# Patient Record
Sex: Female | Born: 1960 | Race: White | Hispanic: No | Smoking: Former smoker
Health system: Southern US, Community
[De-identification: ages and names within clinical notes are randomized; demographics above are authoritative.]

## PROBLEM LIST (undated history)

## (undated) ENCOUNTER — Emergency Department: Admission: EM | Discharge status: Absent without leave | Payer: Medicare Other

## (undated) DIAGNOSIS — F419 Anxiety disorder, unspecified: Secondary | ICD-10-CM

## (undated) DIAGNOSIS — R569 Unspecified convulsions: Secondary | ICD-10-CM

## (undated) DIAGNOSIS — F319 Bipolar disorder, unspecified: Secondary | ICD-10-CM

## (undated) DIAGNOSIS — E119 Type 2 diabetes mellitus without complications: Secondary | ICD-10-CM

## (undated) DIAGNOSIS — F0390 Unspecified dementia without behavioral disturbance: Secondary | ICD-10-CM

## (undated) DIAGNOSIS — J45909 Unspecified asthma, uncomplicated: Secondary | ICD-10-CM

## (undated) DIAGNOSIS — J449 Chronic obstructive pulmonary disease, unspecified: Secondary | ICD-10-CM

## (undated) DIAGNOSIS — I509 Heart failure, unspecified: Secondary | ICD-10-CM

## (undated) DIAGNOSIS — G40909 Epilepsy, unspecified, not intractable, without status epilepticus: Secondary | ICD-10-CM

## (undated) HISTORY — PX: OTHER SURGICAL HISTORY: SHX169

## (undated) HISTORY — DX: Anxiety disorder, unspecified: F41.9

## (undated) HISTORY — PX: APPENDECTOMY: SHX54

---

## 1999-04-04 ENCOUNTER — Emergency Department (HOSPITAL_COMMUNITY): Admission: EM | Admit: 1999-04-04 | Discharge: 1999-04-04 | Payer: Self-pay | Admitting: Emergency Medicine

## 1999-04-05 ENCOUNTER — Emergency Department (HOSPITAL_COMMUNITY): Admission: EM | Admit: 1999-04-05 | Discharge: 1999-04-05 | Payer: Self-pay | Admitting: Emergency Medicine

## 1999-04-14 ENCOUNTER — Emergency Department (HOSPITAL_COMMUNITY): Admission: EM | Admit: 1999-04-14 | Discharge: 1999-04-14 | Payer: Self-pay | Admitting: Emergency Medicine

## 1999-06-05 ENCOUNTER — Ambulatory Visit: Admission: RE | Admit: 1999-06-05 | Discharge: 1999-06-05 | Payer: Self-pay | Admitting: Gynecology

## 1999-06-27 ENCOUNTER — Inpatient Hospital Stay (HOSPITAL_COMMUNITY): Admission: RE | Admit: 1999-06-27 | Discharge: 1999-07-04 | Payer: Self-pay | Admitting: Gynecology

## 1999-07-18 ENCOUNTER — Ambulatory Visit: Admission: RE | Admit: 1999-07-18 | Discharge: 1999-07-18 | Payer: Self-pay | Admitting: Gynecology

## 1999-08-29 ENCOUNTER — Ambulatory Visit: Admission: RE | Admit: 1999-08-29 | Discharge: 1999-08-29 | Payer: Self-pay | Admitting: Gynecology

## 2000-11-18 ENCOUNTER — Inpatient Hospital Stay (HOSPITAL_COMMUNITY): Admission: EM | Admit: 2000-11-18 | Discharge: 2000-11-24 | Payer: Self-pay | Admitting: *Deleted

## 2000-11-19 ENCOUNTER — Encounter: Payer: Self-pay | Admitting: *Deleted

## 2001-04-04 ENCOUNTER — Inpatient Hospital Stay (HOSPITAL_COMMUNITY): Admission: EM | Admit: 2001-04-04 | Discharge: 2001-04-07 | Payer: Self-pay | Admitting: Psychiatry

## 2003-12-12 ENCOUNTER — Inpatient Hospital Stay (HOSPITAL_COMMUNITY): Admission: EM | Admit: 2003-12-12 | Discharge: 2003-12-12 | Payer: Self-pay | Admitting: Psychiatry

## 2003-12-12 ENCOUNTER — Inpatient Hospital Stay (HOSPITAL_COMMUNITY): Admission: EM | Admit: 2003-12-12 | Discharge: 2003-12-14 | Payer: Self-pay | Admitting: Emergency Medicine

## 2004-08-05 ENCOUNTER — Ambulatory Visit: Payer: Self-pay | Admitting: Psychiatry

## 2004-08-05 ENCOUNTER — Inpatient Hospital Stay (HOSPITAL_COMMUNITY): Admission: EM | Admit: 2004-08-05 | Discharge: 2004-08-08 | Payer: Self-pay | Admitting: Psychiatry

## 2004-08-06 ENCOUNTER — Encounter: Payer: Self-pay | Admitting: Emergency Medicine

## 2015-07-01 ENCOUNTER — Encounter: Payer: Self-pay | Admitting: Emergency Medicine

## 2015-07-01 ENCOUNTER — Emergency Department: Payer: Medicare Other

## 2015-07-01 ENCOUNTER — Emergency Department
Admission: EM | Admit: 2015-07-01 | Discharge: 2015-07-01 | Disposition: A | Discharge status: Home / Self Care | Payer: Medicare Other | Attending: Emergency Medicine | Admitting: Emergency Medicine

## 2015-07-01 ENCOUNTER — Other Ambulatory Visit: Payer: Self-pay

## 2015-07-01 DIAGNOSIS — Z79899 Other long term (current) drug therapy: Secondary | ICD-10-CM | POA: Diagnosis not present

## 2015-07-01 DIAGNOSIS — Z87891 Personal history of nicotine dependence: Secondary | ICD-10-CM | POA: Insufficient documentation

## 2015-07-01 DIAGNOSIS — Z7951 Long term (current) use of inhaled steroids: Secondary | ICD-10-CM | POA: Diagnosis not present

## 2015-07-01 DIAGNOSIS — J441 Chronic obstructive pulmonary disease with (acute) exacerbation: Secondary | ICD-10-CM | POA: Diagnosis not present

## 2015-07-01 DIAGNOSIS — F319 Bipolar disorder, unspecified: Secondary | ICD-10-CM | POA: Diagnosis not present

## 2015-07-01 DIAGNOSIS — J40 Bronchitis, not specified as acute or chronic: Secondary | ICD-10-CM

## 2015-07-01 DIAGNOSIS — R509 Fever, unspecified: Secondary | ICD-10-CM | POA: Diagnosis present

## 2015-07-01 HISTORY — DX: Unspecified asthma, uncomplicated: J45.909

## 2015-07-01 HISTORY — DX: Epilepsy, unspecified, not intractable, without status epilepticus: G40.909

## 2015-07-01 HISTORY — DX: Heart failure, unspecified: I50.9

## 2015-07-01 HISTORY — DX: Bipolar disorder, unspecified: F31.9

## 2015-07-01 LAB — CBC WITH DIFFERENTIAL/PLATELET
Basophils Absolute: 0.1 10*3/uL (ref 0–0.1)
Basophils Relative: 1 %
Eosinophils Absolute: 0.1 10*3/uL (ref 0–0.7)
Eosinophils Relative: 2 %
HCT: 39.7 % (ref 35.0–47.0)
Hemoglobin: 12.5 g/dL (ref 12.0–16.0)
Lymphocytes Relative: 25 %
Lymphs Abs: 1.5 10*3/uL (ref 1.0–3.6)
MCH: 25.8 pg — ABNORMAL LOW (ref 26.0–34.0)
MCHC: 31.5 g/dL — ABNORMAL LOW (ref 32.0–36.0)
MCV: 81.9 fL (ref 80.0–100.0)
Monocytes Absolute: 0.5 10*3/uL (ref 0.2–0.9)
Monocytes Relative: 8 %
Neutro Abs: 4 10*3/uL (ref 1.4–6.5)
Neutrophils Relative %: 64 %
Platelets: 172 10*3/uL (ref 150–440)
RBC: 4.85 MIL/uL (ref 3.80–5.20)
RDW: 23.4 % — ABNORMAL HIGH (ref 11.5–14.5)
WBC: 6.2 10*3/uL (ref 3.6–11.0)

## 2015-07-01 LAB — URINALYSIS COMPLETE WITH MICROSCOPIC (ARMC ONLY)
BILIRUBIN URINE: NEGATIVE
Bacteria, UA: NONE SEEN
Glucose, UA: NEGATIVE mg/dL
HGB URINE DIPSTICK: NEGATIVE
KETONES UR: NEGATIVE mg/dL
LEUKOCYTES UA: NEGATIVE
NITRITE: NEGATIVE
PH: 6 (ref 5.0–8.0)
Protein, ur: NEGATIVE mg/dL
SPECIFIC GRAVITY, URINE: 1.012 (ref 1.005–1.030)

## 2015-07-01 LAB — COMPREHENSIVE METABOLIC PANEL
ALT: 15 U/L (ref 14–54)
ANION GAP: 9 (ref 5–15)
AST: 20 U/L (ref 15–41)
Albumin: 3.5 g/dL (ref 3.5–5.0)
Alkaline Phosphatase: 138 U/L — ABNORMAL HIGH (ref 38–126)
BILIRUBIN TOTAL: 0.8 mg/dL (ref 0.3–1.2)
BUN: 15 mg/dL (ref 6–20)
CO2: 21 mmol/L — ABNORMAL LOW (ref 22–32)
Calcium: 9.5 mg/dL (ref 8.9–10.3)
Chloride: 113 mmol/L — ABNORMAL HIGH (ref 101–111)
Creatinine, Ser: 0.86 mg/dL (ref 0.44–1.00)
GFR calc Af Amer: >60 mL/min (ref >60)
Glucose, Bld: 107 mg/dL — ABNORMAL HIGH (ref 65–99)
POTASSIUM: 4.2 mmol/L (ref 3.5–5.1)
Sodium: 143 mmol/L (ref 135–145)
TOTAL PROTEIN: 6 g/dL — AB (ref 6.5–8.1)

## 2015-07-01 MED ORDER — AZITHROMYCIN 250 MG PO TABS: ORAL_TABLET | ORAL | Status: DC

## 2015-07-01 MED ORDER — ALBUTEROL SULFATE HFA 108 (90 BASE) MCG/ACT IN AERS: INHALATION_SPRAY | RESPIRATORY_TRACT | Status: DC

## 2015-07-01 NOTE — ED Provider Notes (Signed)
[facility] Emergency Department Provider Note  ____________________________________________  Time seen: Approximately 8:42 PM  I have reviewed the triage vital signs and the nursing notes.   HISTORY  Chief Complaint Fever  The patient has some baseline cognitive disabilities  HPI Tammy [name] is a 54 y.o. female with history of bipolar disorder, asthma/COPD, and CHF who presents with about one week of cold symptoms, nasal congestion, dry cough.  It was a gradual onset and she reports that she "feels terrible".  She has had mild shortness of breath along with the cough.  She denies chest pain, abdominal pain, nausea, vomiting, dysuria.  She is a former smoker with a history of COPD.   Past Medical History  Diagnosis Date  . Bipolar 1 disorder   . Asthma   . Epilepsy   . CHF (congestive heart failure)     There are no active problems to display for this patient.   History reviewed. No pertinent past surgical history.  Current Outpatient Rx  Name  Route  Sig  Dispense  Refill  . benztropine (COGENTIN) 1 MG tablet   Oral   Take 1 mg by mouth 2 (two) times daily.         . budesonide-formoterol (SYMBICORT) 80-4.5 MCG/ACT inhaler   Inhalation   Inhale 2 puffs into the lungs 2 (two) times daily.         . carvedilol (COREG) 3.125 MG tablet   Oral   Take 3.125 mg by mouth 2 (two) times daily with a meal.         . levothyroxine (SYNTHROID, LEVOTHROID) 75 MCG tablet   Oral   Take 75 mcg by mouth daily before breakfast.         . lithium carbonate 300 MG capsule   Oral   Take 300 mg by mouth 2 (two) times daily with a meal.         . Omega-3 Fatty Acids (FISH OIL) 1000 MG CAPS   Oral   Take 1 capsule by mouth 2 (two) times daily.         . risperiDONE (RISPERDAL) 3 MG tablet   Oral   Take 3 mg by mouth 2 (two) times daily.         Marland Kitchen albuterol (PROVENTIL HFA;VENTOLIN HFA) 108 (90 BASE) MCG/ACT inhaler      Inhale 4-6  puffs by mouth every 4 hours as needed for wheezing, cough, and/or shortness of breath   1 Inhaler   1   . azithromycin (ZITHROMAX) 250 MG tablet      Take 2 tablets PO on day 1, then take 1 tablet PO daily for 4 more days   4 each   0     Allergies Review of patient's allergies indicates no known allergies.  History reviewed. No pertinent family history.  Social History Social History  Substance Use Topics  . Smoking status: Never Smoker   . Smokeless tobacco: None  . Alcohol Use: No    Review of Systems Constitutional: No fever/chills Eyes: No visual changes. ENT: No sore throat.  She drools at baseline. Cardiovascular: Denies chest pain. Respiratory: Mild cough, intermittent shortness of breath particularly associated with a cough Gastrointestinal: No abdominal pain.  No nausea, no vomiting.  No diarrhea.  No constipation. Genitourinary: Negative for dysuria. Musculoskeletal: Negative for back pain. Skin: Negative for rash. Neurological: Negative for headaches, focal weakness or numbness.  10-point ROS otherwise negative.  ____________________________________________   PHYSICAL EXAM:  VITAL SIGNS: ED Triage Vitals  Enc Vitals Group     BP 07/01/15 1808 123/69 mmHg     Pulse Rate 07/01/15 1808 79     Resp 07/01/15 1808 20     Temp 07/01/15 1808 99.1 F (37.3 C)     Temp Source 07/01/15 1808 Oral     SpO2 07/01/15 1808 95 %     Weight 07/01/15 1808 120 lb (54.432 kg)     Height 07/01/15 1808  (1.575 m)     Head Cir --      Peak Flow --      Pain Score 07/01/15 1810 10     Pain Loc --      Pain Edu? --      Excl. in GC? --     Constitutional: Alert and oriented. Well appearing and in no acute distress. Eyes: Conjunctivae are normal. PERRL. EOMI. Head: Atraumatic. Nose: No congestion/rhinnorhea. Mouth/Throat: Mucous membranes are moist.  Oropharynx non-erythematous.  Drools slightly at baseline but she states that she always wears at bedtime for  this.  She denies any sore throat Neck: No stridor.   Hematological/Lymphatic/Immunilogical: No cervical lymphadenopathy. Cardiovascular: Normal rate, regular rhythm. Grossly normal heart sounds.  Good peripheral circulation. Respiratory: Normal respiratory effort.  No retractions.  Mild expiratory wheezing throughout with a few crackles in the bases, left worse than right Gastrointestinal: Soft and nontender. No distention. No abdominal bruits. No CVA tenderness. Musculoskeletal: No lower extremity tenderness nor edema.  No joint effusions. Neurologic:  Normal speech and language. No gross focal neurologic deficits are appreciated.  Skin:  Skin is warm, dry and intact. No rash noted.   ____________________________________________   LABS (all labs ordered are listed, but only abnormal results are displayed)  Labs Reviewed  CBC WITH DIFFERENTIAL/PLATELET - Abnormal; Notable for the following:    MCH 25.8 (*)    MCHC 31.5 (*)    RDW 23.4 (*)    All other components within normal limits  URINALYSIS COMPLETEWITH MICROSCOPIC ([facility] ONLY) - Abnormal; Notable for the following:    Color, Urine YELLOW (*)    APPearance CLEAR (*)    Squamous Epithelial / LPF 0-5 (*)    All other components within normal limits  COMPREHENSIVE METABOLIC PANEL - Abnormal; Notable for the following:    Chloride 113 (*)    CO2 21 (*)    Glucose, Bld 107 (*)    Total Protein 6.0 (*)    Alkaline Phosphatase 138 (*)    All other components within normal limits   ____________________________________________  EKG  ED ECG REPORT I, FORBACH, CORY, the attending physician, personally viewed and interpreted this ECG.  Date: 07/01/2015 EKG Time: 18:06 Rate: 63 Rhythm: normal sinus rhythm QRS Axis: normal Intervals: normal ST/T Wave abnormalities: normal Conduction Disutrbances: none Narrative Interpretation: unremarkable  ____________________________________________  RADIOLOGY   Dg Neck Soft  Tissue  07/01/2015   CLINICAL DATA:  Throat pain and uncontrollable drooling.  EXAM: NECK SOFT TISSUES - 1+ VIEW  COMPARISON:  The cervical spine dated 12/06/2014.  FINDINGS: Lower cervical spine degenerative changes. Normal appearing soft tissues and airway. Atheromatous aortic arch calcifications.  IMPRESSION: No acute abnormality.   Electronically Signed   By: Beckie Salts M.D.   On: 07/01/2015 19:29   Dg Chest 2 View  07/01/2015   CLINICAL DATA:  Chest pain and cough for 1 week  EXAM: CHEST - 2 VIEW  COMPARISON:  01/29/2015  FINDINGS: Cardiac shadow is stable. Aortic calcifications  are again noted. The lungs are well aerated bilaterally but hyperinflated. No focal infiltrate or sizable effusion is seen. No bony abnormality is noted.  IMPRESSION: COPD without acute abnormality.   Electronically Signed   By: Alcide Clever M.D.   On: 07/01/2015 19:19    ____________________________________________   PROCEDURES  Procedure(s) performed: None  Critical Care performed: No ____________________________________________   INITIAL IMPRESSION / ASSESSMENT AND PLAN / ED COURSE  Pertinent labs & imaging results that were available during my care of the patient were reviewed by me and considered in my medical decision making (see chart for details).  The patient is well-appearing and appears to be at her baseline.  Her labs are all reassuring as are her chest x-ray and neck x-ray, which I got given her history of drooling although apparently this is chronic.  I suspect she is suffering from mild bronchitis.  Given her history of COPD and a week of symptoms, I will treat with azithromycin.  I do not believe that she has symptoms severe enough to necessitate prednisone at this time and I do not want to suppress her immune system.  She is well-appearing and in no acute distress and has been stable throughout her stay in the emergency department.  ____________________________________________  FINAL  CLINICAL IMPRESSION(S) / ED DIAGNOSES  Final diagnoses:  Bronchitis      NEW MEDICATIONS STARTED DURING THIS VISIT:  Discharge Medication List as of 07/01/2015 11:03 PM    START taking these medications   Details  azithromycin (ZITHROMAX) 250 MG tablet Take 2 tablets PO on day 1, then take 1 tablet PO daily for 4 more days, Print         Loleta Rose, MD 07/02/15 [phone]

## 2015-07-01 NOTE — ED Notes (Signed)
Pt here via ems from the group home told staff that she felt terrible. Pt states that she hurts in her chest, coughing, pt is drooling and wearing a cloth to catch the excess saliva.

## 2015-07-01 NOTE — ED Notes (Signed)
Pt reports cold sx, nasal congestion, dry cough x 1 week.  Pt NAD at this time.

## 2015-07-01 NOTE — Discharge Instructions (Signed)
We believe that your symptoms are caused today by an exacerbation of your COPD, and possibly bronchitis.  Please take the prescribed medications and any medications that you have at home for your COPD.  Follow up with your doctor as recommended.  If you develop any new or worsening symptoms, including but not limited to fever, persistent vomiting, worsening shortness of breath, or other symptoms that concern you, please return to the Emergency Department immediately. ° ° °Chronic Obstructive Pulmonary Disease °Chronic obstructive pulmonary disease (COPD) is a common lung condition in which airflow from the lungs is limited. COPD is a general term that can be used to describe many different lung problems that limit airflow, including both chronic bronchitis and emphysema.  If you have COPD, your lung function will probably never return to normal, but there are measures you can take to improve lung function and make yourself feel better.  °CAUSES  °· Smoking (common).   °· Exposure to secondhand smoke.   °· Genetic problems. °· Chronic inflammatory lung diseases or recurrent infections. °SYMPTOMS  °· Shortness of breath, especially with physical activity.   °· Deep, persistent (chronic) cough with a large amount of thick mucus.   °· Wheezing.   °· Rapid breaths (tachypnea).   °· Gray or bluish discoloration (cyanosis) of the skin, especially in fingers, toes, or lips.   °· Fatigue.   °· Weight loss.   °· Frequent infections or episodes when breathing symptoms become much worse (exacerbations).   °· Chest tightness. °DIAGNOSIS  °Your health care provider will take a medical history and perform a physical examination to make the initial diagnosis.  Additional tests for COPD may include:  °· Lung (pulmonary) function tests. °· Chest X-ray. °· CT scan. °· Blood tests. °TREATMENT  °Treatment available to help you feel better when you have COPD includes:  °· Inhaler and nebulizer medicines. These help manage the symptoms of  COPD and make your breathing more comfortable. °· Supplemental oxygen. Supplemental oxygen is only helpful if you have a low oxygen level in your blood.   °· Exercise and physical activity. These are beneficial for nearly all people with COPD. Some people may also benefit from a pulmonary rehabilitation program. °HOME CARE INSTRUCTIONS  °· Take all medicines (inhaled or pills) as directed by your health care provider. °· Avoid over-the-counter medicines or cough syrups that dry up your airway (such as antihistamines) and slow down the elimination of secretions unless instructed otherwise by your health care provider.   °· If you are a smoker, the most important thing that you can do is stop smoking. Continuing to smoke will cause further lung damage and breathing trouble. Ask your health care provider for help with quitting smoking. He or she can direct you to community resources or hospitals that provide support. °· Avoid exposure to irritants such as smoke, chemicals, and fumes that aggravate your breathing. °· Use oxygen therapy and pulmonary rehabilitation if directed by your health care provider. If you require home oxygen therapy, ask your health care provider whether you should purchase a pulse oximeter to measure your oxygen level at home.   °· Avoid contact with individuals who have a contagious illness. °· Avoid extreme temperature and humidity changes. °· Eat healthy foods. Eating smaller, more frequent meals and resting before meals may help you maintain your strength. °· Stay active, but balance activity with periods of rest. Exercise and physical activity will help you maintain your ability to do things you want to do. °· Preventing infection and hospitalization is very important when you have COPD. Make sure   to receive all the vaccines your health care provider recommends, especially the pneumococcal and influenza vaccines. Ask your health care provider whether you need a pneumonia vaccine. °· Learn  and use relaxation techniques to manage stress. °· Learn and use controlled breathing techniques as directed by your health care provider. Controlled breathing techniques include:   °¨ Pursed lip breathing. Start by breathing in (inhaling) through your nose for 1 second. Then, purse your lips as if you were going to whistle and breathe out (exhale) through the pursed lips for 2 seconds.   °¨ Diaphragmatic breathing. Start by putting one hand on your abdomen just above your waist. Inhale slowly through your nose. The hand on your abdomen should move out. Then purse your lips and exhale slowly. You should be able to feel the hand on your abdomen moving in as you exhale.   °· Learn and use controlled coughing to clear mucus from your lungs. Controlled coughing is a series of short, progressive coughs. The steps of controlled coughing are:   °1. Lean your head slightly forward.   °2. Breathe in deeply using diaphragmatic breathing.   °3. Try to hold your breath for 3 seconds.   °4. Keep your mouth slightly open while coughing twice.   °5. Spit any mucus out into a tissue.   °6. Rest and repeat the steps once or twice as needed. °SEEK MEDICAL CARE IF:  °· You are coughing up more mucus than usual.   °· There is a change in the color or thickness of your mucus.   °· Your breathing is more labored than usual.   °· Your breathing is faster than usual.   °SEEK IMMEDIATE MEDICAL CARE IF:  °· You have shortness of breath while you are resting.   °· You have shortness of breath that prevents you from: °¨ Being able to talk.   °¨ Performing your usual physical activities.   °· You have chest pain lasting longer than 5 minutes.   °· Your skin color is more cyanotic than usual. °· You measure low oxygen saturations for longer than 5 minutes with a pulse oximeter. °MAKE SURE YOU:  °· Understand these instructions. °· Will watch your condition. °· Will get help right away if you are not doing well or get worse. °Document Released:  07/03/2005 Document Revised: 02/07/2014 Document Reviewed: 05/20/2013 °[employer]® Patient Information ©2015 [employer]. This information is not intended to replace advice given to you by your health care provider. Make sure you discuss any questions you have with your health care provider. ° ° ° °

## 2015-07-03 ENCOUNTER — Encounter: Payer: Self-pay | Admitting: Emergency Medicine

## 2015-07-03 ENCOUNTER — Emergency Department
Admission: EM | Admit: 2015-07-03 | Discharge: 2015-07-03 | Disposition: A | Discharge status: Home / Self Care | Payer: Medicare Other | Attending: Emergency Medicine | Admitting: Emergency Medicine

## 2015-07-03 DIAGNOSIS — R300 Dysuria: Secondary | ICD-10-CM | POA: Diagnosis not present

## 2015-07-03 DIAGNOSIS — R55 Syncope and collapse: Secondary | ICD-10-CM | POA: Insufficient documentation

## 2015-07-03 DIAGNOSIS — R531 Weakness: Secondary | ICD-10-CM | POA: Diagnosis present

## 2015-07-03 LAB — CBC WITH DIFFERENTIAL/PLATELET
BASOS ABS: 0 10*3/uL (ref 0–0.1)
Basophils Relative: 1 %
EOS PCT: 2 %
Eosinophils Absolute: 0.1 10*3/uL (ref 0–0.7)
HCT: 40 % (ref 35.0–47.0)
Hemoglobin: 12.6 g/dL (ref 12.0–16.0)
LYMPHS PCT: 22 %
Lymphs Abs: 1.3 10*3/uL (ref 1.0–3.6)
MCH: 25.7 pg — AB (ref 26.0–34.0)
MCHC: 31.4 g/dL — AB (ref 32.0–36.0)
MCV: 81.8 fL (ref 80.0–100.0)
MONO ABS: 0.5 10*3/uL (ref 0.2–0.9)
Monocytes Relative: 8 %
Neutro Abs: 4 10*3/uL (ref 1.4–6.5)
Neutrophils Relative %: 67 %
PLATELETS: 156 10*3/uL (ref 150–440)
RBC: 4.89 MIL/uL (ref 3.80–5.20)
RDW: 23.2 % — AB (ref 11.5–14.5)
WBC: 5.9 10*3/uL (ref 3.6–11.0)

## 2015-07-03 LAB — COMPREHENSIVE METABOLIC PANEL
ALT: 15 U/L (ref 14–54)
AST: 22 U/L (ref 15–41)
Albumin: 3.4 g/dL — ABNORMAL LOW (ref 3.5–5.0)
Alkaline Phosphatase: 131 U/L — ABNORMAL HIGH (ref 38–126)
Anion gap: 3 — ABNORMAL LOW (ref 5–15)
BUN: 13 mg/dL (ref 6–20)
CALCIUM: 8.7 mg/dL — AB (ref 8.9–10.3)
CO2: 24 mmol/L (ref 22–32)
CREATININE: 0.77 mg/dL (ref 0.44–1.00)
Chloride: 114 mmol/L — ABNORMAL HIGH (ref 101–111)
GLUCOSE: 102 mg/dL — AB (ref 65–99)
Potassium: 3.8 mmol/L (ref 3.5–5.1)
Sodium: 141 mmol/L (ref 135–145)
TOTAL PROTEIN: 5.9 g/dL — AB (ref 6.5–8.1)
Total Bilirubin: 0.8 mg/dL (ref 0.3–1.2)

## 2015-07-03 LAB — TROPONIN I: Troponin I: <0.03 ng/mL (ref <0.031)

## 2015-07-03 LAB — LITHIUM LEVEL: Lithium Lvl: 1.1 mmol/L (ref 0.60–1.20)

## 2015-07-03 MED ORDER — NITROFURANTOIN MONOHYD MACRO 100 MG PO CAPS: 100.0000 mg | ORAL_CAPSULE | Freq: Two times a day (BID) | ORAL | Status: DC

## 2015-07-03 MED ORDER — NITROFURANTOIN MONOHYD MACRO 100 MG PO CAPS
100.0000 mg | ORAL_CAPSULE | Freq: Once | ORAL | Status: AC
  Administered 2015-07-03: 100 mg via ORAL
  Filled 2015-07-03: qty 1

## 2015-07-03 MED ORDER — BENZTROPINE MESYLATE 1 MG PO TABS: 1.0000 mg | ORAL_TABLET | Freq: Two times a day (BID) | ORAL | Status: DC

## 2015-07-03 MED ORDER — SODIUM CHLORIDE 0.9 % IV SOLN
Freq: Once | INTRAVENOUS | Status: AC
  Administered 2015-07-03: 1000 mL via INTRAVENOUS
  Filled 2015-07-03: qty 1000

## 2015-07-03 NOTE — ED Notes (Signed)
pt to ed via ems from home with c/o syncopal episode today at [facility].  Pt states she fell backwards and hit her head.  Pt alert and oriented at this time.  Pt placed on cm ekg done, and dr Mayford Knife at bedside.

## 2015-07-03 NOTE — Discharge Instructions (Signed)
Syncope °Syncope is a medical term for fainting or passing out. This means you lose consciousness and drop to the ground. People are generally unconscious for less than 5 minutes. You may have some muscle twitches for up to 15 seconds before waking up and returning to normal. Syncope occurs more often in older adults, but it can happen to anyone. While most causes of syncope are not dangerous, syncope can be a sign of a serious medical problem. It is important to seek medical care.  °CAUSES  °Syncope is caused by a sudden drop in blood flow to the brain. The specific cause is often not determined. Factors that can bring on syncope include: °· Taking medicines that lower blood pressure. °· Sudden changes in posture, such as standing up quickly. °· Taking more medicine than prescribed. °· Standing in one place for too long. °· Seizure disorders. °· Dehydration and excessive exposure to heat. °· Low blood sugar (hypoglycemia). °· Straining to have a bowel movement. °· Heart disease, irregular heartbeat, or other circulatory problems. °· Fear, emotional distress, seeing blood, or severe pain. °SYMPTOMS  °Right before fainting, you may: °· Feel dizzy or light-headed. °· Feel nauseous. °· See all white or all black in your field of vision. °· Have cold, clammy skin. °DIAGNOSIS  °Your health care provider will ask about your symptoms, perform a physical exam, and perform an electrocardiogram (ECG) to record the electrical activity of your heart. Your health care provider may also perform other heart or blood tests to determine the cause of your syncope which may include: °· Transthoracic echocardiogram (TTE). During echocardiography, sound waves are used to evaluate how blood flows through your heart. °· Transesophageal echocardiogram (TEE). °· Cardiac monitoring. This allows your health care provider to monitor your heart rate and rhythm in real time. °· Holter monitor. This is a portable device that records your  heartbeat and can help diagnose heart arrhythmias. It allows your health care provider to track your heart activity for several days, if needed. °· Stress tests by exercise or by giving medicine that makes the heart beat faster. °TREATMENT  °In most cases, no treatment is needed. Depending on the cause of your syncope, your health care provider may recommend changing or stopping some of your medicines. °HOME CARE INSTRUCTIONS °· Have someone stay with you until you feel stable. °· Do not drive, use machinery, or play sports until your health care provider says it is okay. °· Keep all follow-up appointments as directed by your health care provider. °· Lie down right away if you start feeling like you might faint. Breathe deeply and steadily. Wait until all the symptoms have passed. °· Drink enough fluids to keep your urine clear or pale yellow. °· If you are taking blood pressure or heart medicine, get up slowly and take several minutes to sit and then stand. This can reduce dizziness. °SEEK IMMEDIATE MEDICAL CARE IF:  °· You have a severe headache. °· You have unusual pain in the chest, abdomen, or back. °· You are bleeding from your mouth or rectum, or you have black or tarry stool. °· You have an irregular or very fast heartbeat. °· You have pain with breathing. °· You have repeated fainting or seizure-like jerking during an episode. °· You faint when sitting or lying down. °· You have confusion. °· You have trouble walking. °· You have severe weakness. °· You have vision problems. °If you fainted, call your local emergency services (911 in [place] Do not drive   yourself to the hospital.  MAKE SURE YOU:  Understand these instructions.  Will watch your condition.  Will get help right away if you are not doing well or get worse. Document Released: 09/23/2005 Document Revised: 09/28/2013 Document Reviewed: 11/22/2011 [facility] Patient Information 2015 [place], [place]. This information is not intended to replace  advice given to you by your health care provider. Make sure you discuss any questions you have with your health care provider.  He will need to stop taking propranolol and go back to take Cogentin after previously prescribed dosage. Propranolol is likely the reason you've been feeling lightheaded and passed out.

## 2015-07-03 NOTE — ED Provider Notes (Signed)
[facility] Emergency Department Adoria Kawamoto Note     Time seen: ----------------------------------------- 10:02 AM on 07/03/2015 -----------------------------------------    I have reviewed the triage vital signs and the nursing notes.   HISTORY  Chief Complaint No chief complaint on file.    HPI Tammy [name] is a 54 y.o. female who presents ER after syncopal episode. Patient is not able to describe what happened when she passed out. Patient states she's been feeling ill for the last week. She cannot describe why she feels poorly. Denies any fevers, chills, chest pain, shortness of breath, nausea vomiting or diarrhea. She does have history of bipolar disorder, notes her Geodon was changed to lithium recently.   Past Medical History  Diagnosis Date  . Bipolar 1 disorder   . Asthma   . Epilepsy   . CHF (congestive heart failure)     There are no active problems to display for this patient.   No past surgical history on file.  Allergies Review of patient's allergies indicates no known allergies.  Social History Social History  Substance Use Topics  . Smoking status: Never Smoker   . Smokeless tobacco: Not on file  . Alcohol Use: No    Review of Systems Constitutional: Negative for fever. Eyes: Negative for visual changes. ENT: Negative for sore throat. Cardiovascular: Negative for chest pain. Respiratory: Negative for shortness of breath. Gastrointestinal: Negative for abdominal pain, vomiting and diarrhea. Genitourinary: Negative for dysuria. Musculoskeletal: Negative for back pain. Skin: Negative for rash. Neurological: Negative for headaches, positive for weakness  10-point ROS otherwise negative.  ____________________________________________   PHYSICAL EXAM:  VITAL SIGNS: ED Triage Vitals  Enc Vitals Group     BP --      Pulse --      Resp --      Temp --      Temp src --      SpO2 --      Weight --      Height  --      Head Cir --      Peak Flow --      Pain Score --      Pain Loc --      Pain Edu? --      Excl. in GC? --     Constitutional: Alert and oriented. Well appearing and in no distress. Eyes: Conjunctivae are normal. PERRL. Normal extraocular movements. ENT   Head: Normocephalic and atraumatic.   Nose: No congestion/rhinnorhea.   Mouth/Throat: Mucous membranes are moist.   Neck: No stridor. Cardiovascular: Normal rate, regular rhythm. Normal and symmetric distal pulses are present in all extremities. No murmurs, rubs, or gallops. Respiratory: Normal respiratory effort without tachypnea nor retractions. Breath sounds are clear and equal bilaterally. No wheezes/rales/rhonchi. Gastrointestinal: Soft and nontender. No distention. No abdominal bruits.  Musculoskeletal: Nontender with normal range of motion in all extremities. No joint effusions.  No lower extremity tenderness nor edema. Neurologic:  Normal speech and language. No gross focal neurologic deficits are appreciated. Speech is normal. No gait instability. Skin:  Skin is warm, dry and intact. No rash noted. Psychiatric: Flat mood and affect. ____________________________________________  EKG: Interpreted by me. Sinus bradycardia with rate of 59 bpm, nonspecific T wave changes, normal axis. No evidence of hypertrophy or acute infarction.  ____________________________________________  ED COURSE:  Pertinent labs & imaging results that were available during my care of the patient were reviewed by me and considered in my medical decision making (see  chart for details). Unclear etiology for syncope. We'll check labs and lithium level ____________________________________________    LABS (pertinent positives/negatives)  Labs Reviewed  CBC WITH DIFFERENTIAL/PLATELET - Abnormal; Notable for the following:    MCH 25.7 (*)    MCHC 31.4 (*)    RDW 23.2 (*)    All other components within normal limits  COMPREHENSIVE  METABOLIC PANEL - Abnormal; Notable for the following:    Chloride 114 (*)    Glucose, Bld 102 (*)    Calcium 8.7 (*)    Total Protein 5.9 (*)    Albumin 3.4 (*)    Alkaline Phosphatase 131 (*)    Anion gap 3 (*)    All other components within normal limits  TROPONIN I  LITHIUM LEVEL  URINALYSIS COMPLETEWITH MICROSCOPIC ([facility] ONLY)     ____________________________________________  FINAL ASSESSMENT AND PLAN  Syncope, bipolar disorder  Plan: Patient with labs and imaging as dictated above. Symptoms are probably related to propranolol. We'll have her hold this medication and start back on Cogentin. Patient also describes some dysuria but is unable to provide a urine sample after repeated attempts here. She'll be placed on Macrobid to cover for UTI. She is also again advised to change propranolol back to Cogentin. This is likely the cause for her symptoms. She is stable for outpatient follow-up.   Emily Filbert, MD   Emily Filbert, MD 07/03/15 [phone]

## 2015-08-25 ENCOUNTER — Encounter: Payer: Self-pay | Admitting: *Deleted

## 2015-08-25 ENCOUNTER — Emergency Department
Admission: EM | Admit: 2015-08-25 | Discharge: 2015-08-25 | Disposition: A | Discharge status: Home / Self Care | Payer: Medicare Other | Attending: Emergency Medicine | Admitting: Emergency Medicine

## 2015-08-25 DIAGNOSIS — Z792 Long term (current) use of antibiotics: Secondary | ICD-10-CM | POA: Insufficient documentation

## 2015-08-25 DIAGNOSIS — Z79899 Other long term (current) drug therapy: Secondary | ICD-10-CM | POA: Diagnosis not present

## 2015-08-25 DIAGNOSIS — Z7951 Long term (current) use of inhaled steroids: Secondary | ICD-10-CM | POA: Diagnosis not present

## 2015-08-25 DIAGNOSIS — F419 Anxiety disorder, unspecified: Secondary | ICD-10-CM

## 2015-08-25 DIAGNOSIS — R55 Syncope and collapse: Secondary | ICD-10-CM | POA: Diagnosis present

## 2015-08-25 LAB — URINALYSIS COMPLETE WITH MICROSCOPIC (ARMC ONLY)
BACTERIA UA: NONE SEEN
Bilirubin Urine: NEGATIVE
GLUCOSE, UA: NEGATIVE mg/dL
Hgb urine dipstick: NEGATIVE
KETONES UR: NEGATIVE mg/dL
NITRITE: NEGATIVE
PROTEIN: NEGATIVE mg/dL
SPECIFIC GRAVITY, URINE: 1.009 (ref 1.005–1.030)
pH: 6 (ref 5.0–8.0)

## 2015-08-25 LAB — BASIC METABOLIC PANEL
Anion gap: 3 — ABNORMAL LOW (ref 5–15)
BUN: 18 mg/dL (ref 6–20)
CALCIUM: 8.6 mg/dL — AB (ref 8.9–10.3)
CO2: 24 mmol/L (ref 22–32)
CREATININE: 0.74 mg/dL (ref 0.44–1.00)
Chloride: 114 mmol/L — ABNORMAL HIGH (ref 101–111)
Glucose, Bld: 97 mg/dL (ref 65–99)
Potassium: 4.5 mmol/L (ref 3.5–5.1)
SODIUM: 141 mmol/L (ref 135–145)

## 2015-08-25 LAB — CBC
HCT: 34.6 % — ABNORMAL LOW (ref 35.0–47.0)
Hemoglobin: 11.2 g/dL — ABNORMAL LOW (ref 12.0–16.0)
MCH: 27.6 pg (ref 26.0–34.0)
MCHC: 32.4 g/dL (ref 32.0–36.0)
MCV: 85.1 fL (ref 80.0–100.0)
PLATELETS: 166 10*3/uL (ref 150–440)
RBC: 4.06 MIL/uL (ref 3.80–5.20)
RDW: 18.8 % — AB (ref 11.5–14.5)
WBC: 6.2 10*3/uL (ref 3.6–11.0)

## 2015-08-25 LAB — TROPONIN I: Troponin I: 0.03 ng/mL (ref <0.031)

## 2015-08-25 LAB — LITHIUM LEVEL: Lithium Lvl: 1.05 mmol/L (ref 0.60–1.20)

## 2015-08-25 MED ORDER — LORAZEPAM 1 MG PO TABS
1.0000 mg | ORAL_TABLET | Freq: Once | ORAL | Status: AC
  Administered 2015-08-25: 1 mg via ORAL
  Filled 2015-08-25: qty 1

## 2015-08-25 MED ORDER — SODIUM CHLORIDE 0.9 % IV BOLUS (SEPSIS)
1000.0000 mL | Freq: Once | INTRAVENOUS | Status: AC
  Administered 2015-08-25: 1000 mL via INTRAVENOUS

## 2015-08-25 NOTE — ED Notes (Signed)
Pt ride said they would be here shortly

## 2015-08-25 NOTE — ED Notes (Signed)
Pt states lives in group home, states goes to [facility] for [facility] daily, states got upset at another person and had syncope episode, nad

## 2015-08-25 NOTE — ED Notes (Signed)
MD at bedside. 

## 2015-08-25 NOTE — ED Notes (Signed)
Was at [facility], pt states she got upset with another person there and had syncope episode, pt states she feels a little anxious still

## 2015-08-25 NOTE — ED Notes (Signed)
Group home contacted and informe d htem she is ready for [facility]

## 2015-08-25 NOTE — ED Provider Notes (Signed)
[facility] Emergency Department Provider Note  ____________________________________________   I have reviewed the triage vital signs and the nursing notes.   HISTORY  Chief Complaint Anxiety    HPI Tammy [name] is a 54 y.o. female who statesshe did not actually pass out, she simply lay on the ground because she was anxious. She denies closed head injury she denies fever/chills she denies any other complaints aside from the fact that she is anxious. She has no SI she has no HI and she is otherwise feeling "just fine".  Past Medical History  Diagnosis Date  . Bipolar 1 disorder (HCC)   . Asthma   . Epilepsy (HCC)   . CHF (congestive heart failure) (HCC)     There are no active problems to display for this patient.   History reviewed. No pertinent past surgical history.  Current Outpatient Rx  Name  Route  Sig  Dispense  Refill  . albuterol (PROVENTIL HFA;VENTOLIN HFA) 108 (90 BASE) MCG/ACT inhaler      Inhale 4-6 puffs by mouth every 4 hours as needed for wheezing, cough, and/or shortness of breath   1 Inhaler   1   . azithromycin (ZITHROMAX) 250 MG tablet      Take 2 tablets PO on day 1, then take 1 tablet PO daily for 4 more days   4 each   0   . benztropine (COGENTIN) 1 MG tablet   Oral   Take 1 tablet (1 mg total) by mouth 2 (two) times daily.   60 tablet   1   . benztropine (COGENTIN) 1 MG tablet   Oral   Take 1 tablet (1 mg total) by mouth 2 (two) times daily.   60 tablet   2   . budesonide-formoterol (SYMBICORT) 80-4.5 MCG/ACT inhaler   Inhalation   Inhale 2 puffs into the lungs 2 (two) times daily.         . carvedilol (COREG) 3.125 MG tablet   Oral   Take 3.125 mg by mouth 2 (two) times daily with a meal.         . levothyroxine (SYNTHROID, LEVOTHROID) 75 MCG tablet   Oral   Take 75 mcg by mouth daily before breakfast.         . lithium carbonate 300 MG capsule   Oral   Take 300 mg by mouth 2 (two) times  daily with a meal.         . nitrofurantoin, macrocrystal-monohydrate, (MACROBID) 100 MG capsule   Oral   Take 1 capsule (100 mg total) by mouth 2 (two) times daily.   20 capsule   0   . Omega-3 Fatty Acids (FISH OIL) 1000 MG CAPS   Oral   Take 1 capsule by mouth 2 (two) times daily.         . risperiDONE (RISPERDAL) 3 MG tablet   Oral   Take 3 mg by mouth 2 (two) times daily.           Allergies Review of patient's allergies indicates no known allergies.  No family history on file.  Social History Social History  Substance Use Topics  . Smoking status: Never Smoker   . Smokeless tobacco: None  . Alcohol Use: No    Review of Systems Constitutional: No fever/chills Eyes: No visual changes. ENT: No sore throat. No stiff neck no neck pain Cardiovascular: Denies chest pain. Respiratory: Denies shortness of breath. Gastrointestinal:   no vomiting.  No diarrhea.  No constipation. Genitourinary: Negative for dysuria. Musculoskeletal: Negative lower extremity swelling Skin: Negative for rash. Neurological: Negative for headaches, focal weakness or numbness. 10-point ROS otherwise negative.  ____________________________________________   PHYSICAL EXAM:  VITAL SIGNS: ED Triage Vitals  Enc Vitals Group     BP 08/25/15 1130 102/60 mmHg     Pulse Rate 08/25/15 1130 56     Resp 08/25/15 1211 20     Temp 08/25/15 1211 98.9 F (37.2 C)     Temp src --      SpO2 08/25/15 1130 96 %     Weight 08/25/15 1211 120 lb (54.432 kg)     Height 08/25/15 1211  (1.575 m)     Head Cir --      Peak Flow --      Pain Score --      Pain Loc --      Pain Edu? --      Excl. in GC? --     Constitutional: Alert and oriented. Well appearing and in no acute distress. Eyes: Conjunctivae are normal. PERRL. EOMI. Head: Atraumatic. Nose: No congestion/rhinnorhea. Mouth/Throat: Mucous membranes are moist.  Oropharynx non-erythematous. Neck: No stridor.   Nontender with no  meningismus Cardiovascular: Normal rate, regular rhythm. Grossly normal heart sounds.  Good peripheral circulation. Respiratory: Normal respiratory effort.  No retractions. Lungs CTAB. Abdominal: Soft and nontender. No distention. No guarding no rebound Back:  There is no focal tenderness or step off there is no midline tenderness there are no lesions noted. there is no CVA tenderness  Musculoskeletal: No lower extremity tenderness. No joint effusions, no DVT signs strong distal pulses no edema Neurologic:  Normal speech and language. No gross focal neurologic deficits are appreciated.  Skin:  Skin is warm, dry and intact. No rash noted. Psychiatric: Mood and affect are normal. Speech and behavior are normal.  ____________________________________________   LABS (all labs ordered are listed, but only abnormal results are displayed)  Labs Reviewed  BASIC METABOLIC PANEL - Abnormal; Notable for the following:    Chloride 114 (*)    Calcium 8.6 (*)    Anion gap 3 (*)    All other components within normal limits  CBC - Abnormal; Notable for the following:    Hemoglobin 11.2 (*)    HCT 34.6 (*)    RDW 18.8 (*)    All other components within normal limits  URINALYSIS COMPLETEWITH MICROSCOPIC ([facility] ONLY) - Abnormal; Notable for the following:    Color, Urine YELLOW (*)    APPearance CLEAR (*)    Leukocytes, UA TRACE (*)    Squamous Epithelial / LPF 0-5 (*)    All other components within normal limits  TROPONIN I   ____________________________________________  EKG  I personally interpreted any EKGs ordered by me or triage Normal sinus rhythm rate 6 21 bpm no acute ST elevation or acute ST depression normal axis unremarkable EKG ____________________________________________  RADIOLOGY  I reviewed any imaging ordered by me or triage that were performed during my shift ____________________________________________   PROCEDURES  Procedure(s) performed: None  Critical Care  performed: None  ____________________________________________   INITIAL IMPRESSION / ASSESSMENT AND PLAN / ED COURSE  Pertinent labs & imaging results that were available during my care of the patient were reviewed by me and considered in my medical decision making (see chart for details).  Patient with a normal neurologic exam no evidence of trauma states that she had a anxiety attack. Nonetheless we have done a  couple micrograms of workup which is very reassuring. We are pending her lithium level. I did give her Ativan, one point she fell sleep has slightly low blood pressure but when she woke up it was back to normal. Patient has had no evidence of acute pathology here. EKG is normal troponins normal CBC is reassuring electrolytes are reassuring, and I feel patient is safe for discharge if her lithium is normal as anticipated ____________________________________________   FINAL CLINICAL IMPRESSION(S) / ED DIAGNOSES  Final diagnoses:  None     Jeanmarie Plant, MD 08/25/15 [phone]

## 2015-08-25 NOTE — Discharge Instructions (Signed)

## 2016-02-19 ENCOUNTER — Ambulatory Visit: Payer: Medicare Other

## 2016-02-19 ENCOUNTER — Encounter: Payer: Medicare Other | Admitting: Podiatry

## 2016-02-19 DIAGNOSIS — M79673 Pain in unspecified foot: Secondary | ICD-10-CM

## 2016-02-19 NOTE — Progress Notes (Signed)
This encounter was created in error - please disregard.

## 2016-03-11 ENCOUNTER — Ambulatory Visit: Payer: Medicare Other | Admitting: Podiatry

## 2016-03-11 ENCOUNTER — Encounter: Payer: Self-pay | Admitting: Podiatry

## 2016-03-11 ENCOUNTER — Ambulatory Visit (INDEPENDENT_AMBULATORY_CARE_PROVIDER_SITE_OTHER): Payer: Medicare Other | Admitting: Podiatry

## 2016-03-11 VITALS — BP 116/78 | HR 69 | Resp 12

## 2016-03-11 DIAGNOSIS — M79676 Pain in unspecified toe(s): Secondary | ICD-10-CM

## 2016-03-11 DIAGNOSIS — B351 Tinea unguium: Secondary | ICD-10-CM | POA: Diagnosis not present

## 2016-03-11 NOTE — Progress Notes (Signed)
   Subjective:    Patient ID: Tammy [name], female    DOB: 1961/07/30, 55 y.o.   MRN: [id number]  HPI: She presents today with a chief complaint of painful elongated toenails that are too long for her to cut. She states that they're too thick I'm unable to cut them and they hurt. Her particularly with shoe gear or anytime she is walking.    Review of Systems  Skin: Positive for color change.       Objective:   Physical Exam: I have reviewed her past history medications and allergies. Vital signs stable she is alert and oriented 3 pulses are palpable. Neurologic sensorium is intact deep tendon reflexes are intact muscle strength is normal bilateral. She has hammertoe deformities bilateral mild HAV deformities. Symptomatically cutaneous evaluation demonstrates supple well-hydrated cutis no open lesions or wounds. She does have thick yellow dystrophic onychomycotic nails with subungual debris sharp rated nail margins which are painful on palpation as well as debridement.        Assessment & Plan:  Assessment: Pain in limb secondary to onychomycosis.  Plan: Debridement of toenails 1 through 5 bilateral. Follow up with her in 3 months.

## 2016-06-12 ENCOUNTER — Ambulatory Visit: Payer: Medicare Other | Admitting: Podiatry

## 2016-06-25 ENCOUNTER — Ambulatory Visit: Payer: Medicare Other | Admitting: Podiatry

## 2016-07-09 ENCOUNTER — Encounter: Payer: Self-pay | Admitting: Podiatry

## 2016-07-09 ENCOUNTER — Ambulatory Visit (INDEPENDENT_AMBULATORY_CARE_PROVIDER_SITE_OTHER): Payer: Medicare Other | Admitting: Podiatry

## 2016-07-09 VITALS — BP 111/82 | HR 76 | Resp 18

## 2016-07-09 DIAGNOSIS — L608 Other nail disorders: Secondary | ICD-10-CM

## 2016-07-09 DIAGNOSIS — B351 Tinea unguium: Secondary | ICD-10-CM

## 2016-07-09 DIAGNOSIS — M79676 Pain in unspecified toe(s): Secondary | ICD-10-CM

## 2016-07-09 DIAGNOSIS — L603 Nail dystrophy: Secondary | ICD-10-CM

## 2016-07-09 DIAGNOSIS — M79609 Pain in unspecified limb: Principal | ICD-10-CM

## 2016-07-14 NOTE — Progress Notes (Signed)
SUBJECTIVE Patient  presents to office today complaining of elongated, thickened nails. Pain while ambulating in shoes. Patient is unable to trim their own nails.   OBJECTIVE General Patient is awake, alert, and oriented x 3 and in no acute distress. Derm Skin is dry and supple bilateral. Negative open lesions or macerations. Remaining integument unremarkable. Nails are tender, long, thickened and dystrophic with subungual debris, consistent with onychomycosis, 1-5 bilateral. No signs of infection noted. Vasc  DP and PT pedal pulses palpable bilaterally. Temperature gradient within normal limits.  Neuro Epicritic and protective threshold sensation diminished bilaterally.  Musculoskeletal Exam No symptomatic pedal deformities noted bilateral. Muscular strength within normal limits.  ASSESSMENT 1. Onychodystrophic nails 1-5 bilateral with hyperkeratosis of nails.  2. Onychomycosis of nail due to dermatophyte bilateral 3. Pain in foot bilateral  PLAN OF CARE 1. Patient evaluated today.  2. Instructed to maintain good pedal hygiene and foot care.  3. Mechanical debridement of nails 1-5 bilaterally performed using a nail nipper. Filed with dremel without incident.  4. Return to clinic in 3 mos.    Keisa Blow M Thaer Miyoshi, DPM    

## 2016-08-22 ENCOUNTER — Encounter: Payer: Self-pay | Admitting: Emergency Medicine

## 2016-08-22 ENCOUNTER — Emergency Department: Payer: Medicare Other

## 2016-08-22 ENCOUNTER — Emergency Department
Admission: EM | Admit: 2016-08-22 | Discharge: 2016-08-22 | Disposition: A | Discharge status: Home / Self Care | Payer: Medicare Other | Attending: Student in an Organized Health Care Education/Training Program | Admitting: Student in an Organized Health Care Education/Training Program

## 2016-08-22 DIAGNOSIS — Z7951 Long term (current) use of inhaled steroids: Secondary | ICD-10-CM | POA: Insufficient documentation

## 2016-08-22 DIAGNOSIS — Z79899 Other long term (current) drug therapy: Secondary | ICD-10-CM | POA: Diagnosis not present

## 2016-08-22 DIAGNOSIS — J45909 Unspecified asthma, uncomplicated: Secondary | ICD-10-CM | POA: Insufficient documentation

## 2016-08-22 DIAGNOSIS — G40909 Epilepsy, unspecified, not intractable, without status epilepticus: Secondary | ICD-10-CM | POA: Diagnosis not present

## 2016-08-22 DIAGNOSIS — I509 Heart failure, unspecified: Secondary | ICD-10-CM | POA: Diagnosis not present

## 2016-08-22 DIAGNOSIS — R569 Unspecified convulsions: Secondary | ICD-10-CM

## 2016-08-22 HISTORY — DX: Unspecified convulsions: R56.9

## 2016-08-22 LAB — PHENYTOIN LEVEL, TOTAL

## 2016-08-22 LAB — BASIC METABOLIC PANEL
Anion gap: 5 (ref 5–15)
BUN: 11 mg/dL (ref 6–20)
CALCIUM: 9.6 mg/dL (ref 8.9–10.3)
CO2: 30 mmol/L (ref 22–32)
Chloride: 103 mmol/L (ref 101–111)
Creatinine, Ser: 1.01 mg/dL — ABNORMAL HIGH (ref 0.44–1.00)
GFR calc Af Amer: >60 mL/min (ref >60)
Glucose, Bld: 93 mg/dL (ref 65–99)
Potassium: 4.6 mmol/L (ref 3.5–5.1)
SODIUM: 138 mmol/L (ref 135–145)

## 2016-08-22 LAB — LITHIUM LEVEL: Lithium Lvl: 1.12 mmol/L (ref 0.60–1.20)

## 2016-08-22 LAB — GLUCOSE, CAPILLARY: GLUCOSE-CAPILLARY: 92 mg/dL (ref 65–99)

## 2016-08-22 NOTE — ED Triage Notes (Signed)
Per EMS report, patient had a syncopal episode at the behavioral group home she lives at. Patient is a baseline mental status per staff. Patient is alert, holds position independently. VSS per EMS. No injuries noted upon arrival or per EMS report.

## 2016-08-22 NOTE — Discharge Instructions (Signed)
You have been diagnosed with seizures or are suspected of having seizures. °Until you are cleared by your family Dr or a Neurologist the precautions listed should be followed: ° °No driving °No tub bathing °No operating heavy machinery °No climbing to heights, such as on a ladder ° °Do no activity that would cause you or others injury if another seizure occurred °Call your Dr. at the number provided below for a neurology appointment or call your Primary care doctor so that they can clear you or coordinate care with a neurologist who can clear you. ° °

## 2016-08-22 NOTE — ED Triage Notes (Addendum)
Pt presents with seizures earlier today, fall and hit the back of her head. , pt with hx of same and does take her meds.  Pt was brought to ed by ems.

## 2016-08-22 NOTE — ED Provider Notes (Signed)
[facility] Emergency Department Provider Note    First MD Initiated Contact with Patient 08/22/16 1135     (approximate)  I have reviewed the triage vital signs and the nursing notes.   HISTORY  Chief Complaint Seizures    HPI Tammy [name] is a 55 y.o. female with a history of seizures as well as congestive heart failure and bipolar 1 disorder presents after a witnessed seizure event that occurred this morning at her behavioral group home. Patient states that she's been compliant with her antiepileptic medications. Patient states that there was no upper jump symptoms. Bystanders stated that she had generalized tonic-clonic shaking with following postictal period. She denies any headache at this time. No numbness or tingling. Denies any chest pain or shortness of breath. States that she generally will have one seizure event yearly.    Past Medical History:  Diagnosis Date  . Asthma   . Bipolar 1 disorder (HCC)   . CHF (congestive heart failure) (HCC)   . Epilepsy (HCC)   . Seizures (HCC)    No family history on file. History reviewed. No pertinent surgical history. There are no active problems to display for this patient.     Prior to Admission medications   Medication Sig Start Date End Date Taking? Authorizing Provider  albuterol (PROVENTIL HFA;VENTOLIN HFA) 108 (90 BASE) MCG/ACT inhaler Inhale 4-6 puffs by mouth every 4 hours as needed for wheezing, cough, and/or shortness of breath 07/01/15   Loleta Roseory Forbach, MD  azithromycin (ZITHROMAX) 250 MG tablet Take 2 tablets PO on day 1, then take 1 tablet PO daily for 4 more days Patient not taking: Reported on 07/09/2016 07/02/15   Loleta Roseory Forbach, MD  benztropine (COGENTIN) 1 MG tablet Take 1 tablet (1 mg total) by mouth 2 (two) times daily. 07/03/15   Emily FilbertJonathan E Williams, MD  benztropine (COGENTIN) 1 MG tablet Take 1 tablet (1 mg total) by mouth 2 (two) times daily. 07/03/15 07/02/16  Emily FilbertJonathan E Williams,  MD  budesonide-formoterol (SYMBICORT) 80-4.5 MCG/ACT inhaler Inhale 2 puffs into the lungs 2 (two) times daily.    Historical Provider, MD  busPIRone (BUSPAR) 5 MG tablet Take 5 mg by mouth 3 (three) times daily.    Historical Provider, MD  carvedilol (COREG) 3.125 MG tablet Take 3.125 mg by mouth 2 (two) times daily with a meal.    Historical Provider, MD  fluticasone (FLONASE) 50 MCG/ACT nasal spray Place into both nostrils daily.    Historical Provider, MD  levothyroxine (SYNTHROID, LEVOTHROID) 75 MCG tablet Take 75 mcg by mouth daily before breakfast.    Historical Provider, MD  lithium carbonate 300 MG capsule Take 300 mg by mouth 2 (two) times daily with a meal.    Historical Provider, MD  loratadine (CLARITIN) 10 MG tablet Take 10 mg by mouth.    Historical Provider, MD  nitrofurantoin, macrocrystal-monohydrate, (MACROBID) 100 MG capsule Take 1 capsule (100 mg total) by mouth 2 (two) times daily. Patient not taking: Reported on 07/09/2016 07/03/15   Emily FilbertJonathan E Williams, MD  nystatin (MYCOSTATIN) 100000 UNIT/ML suspension Take by mouth.    Historical Provider, MD  omega-3 acid ethyl esters (LOVAZA) 1 g capsule Take 1,000 mg by mouth.    Historical Provider, MD  Omega-3 Fatty Acids (FISH OIL) 1000 MG CAPS Take 1 capsule by mouth 2 (two) times daily.    Historical Provider, MD  risperiDONE (RISPERDAL) 3 MG tablet Take 3 mg by mouth 2 (two) times daily.  Historical Provider, MD    Allergies Penicillins and Prednisone    Social History Social History  Substance Use Topics  . Smoking status: Never Smoker  . Smokeless tobacco: Never Used  . Alcohol use No    Review of Systems Patient denies headaches, rhinorrhea, blurry vision, numbness, shortness of breath, chest pain, edema, cough, abdominal pain, nausea, vomiting, diarrhea, dysuria, fevers, rashes or hallucinations unless otherwise stated above in HPI. ____________________________________________   PHYSICAL EXAM:  VITAL  SIGNS: Vitals:   08/22/16 1119  BP: 110/77  Pulse: 72  Resp: 18  Temp: 98.8 F (37.1 C)    Constitutional: Alert and oriented. Well appearing and in no acute distress. Eyes: Conjunctivae are normal. PERRL. EOMI. Head: Atraumatic. Nose: No congestion/rhinnorhea. Mouth/Throat: Mucous membranes are moist.  Oropharynx non-erythematous. Neck: No stridor. Painless ROM. No cervical spine tenderness to palpation Hematological/Lymphatic/Immunilogical: No cervical lymphadenopathy. Cardiovascular: Normal rate, regular rhythm. Grossly normal heart sounds.  Good peripheral circulation. Respiratory: Normal respiratory effort.  No retractions. Lungs CTAB. Gastrointestinal: Soft and nontender. No distention. No abdominal bruits. No CVA tenderness. Musculoskeletal: No lower extremity tenderness nor edema.  No joint effusions. Neurologic: CN- intact.  No facial droop, Normal FNF.  Normal heel to shin.  Sensation intact bilaterally. Normal speech and language. No gross focal neurologic deficits are appreciated. No gait instability.  Skin:  Skin is warm, dry and intact. No rash noted. Psychiatric: Mood and affect are normal. Speech and behavior are normal.  ____________________________________________   LABS (all labs ordered are listed, but only abnormal results are displayed)  Results for orders placed or performed during the hospital encounter of 08/22/16 (from the past 24 hour(s))  Dilantin (phenytoin) Level (if patient is taking this medication)     Status: Abnormal   Collection Time: 08/22/16 11:16 AM  Result Value Ref Range   Phenytoin Lvl <2.5 (L) 10.0 - 20.0 ug/mL  Basic metabolic panel     Status: Abnormal   Collection Time: 08/22/16 11:45 AM  Result Value Ref Range   Sodium 138 135 - 145 mmol/L   Potassium 4.6 3.5 - 5.1 mmol/L   Chloride 103 101 - 111 mmol/L   CO2 30 22 - 32 mmol/L   Glucose, Bld 93 65 - 99 mg/dL   BUN 11 6 - 20 mg/dL   Creatinine, Ser 4.091.01 (H) 0.44 - 1.00 mg/dL    Calcium 9.6 8.9 - 81.110.3 mg/dL   GFR calc non Af Amer >60 >60 mL/min   GFR calc Af Amer >60 >60 mL/min   Anion gap 5 5 - 15  Lithium level     Status: None   Collection Time: 08/22/16 12:23 PM  Result Value Ref Range   Lithium Lvl 1.12 0.60 - 1.20 mmol/L  Glucose, capillary     Status: None   Collection Time: 08/22/16 12:24 PM  Result Value Ref Range   Glucose-Capillary 92 65 - 99 mg/dL   ____________________________________________  EKG My review and personal interpretation at Time: 11:16   Indication: syncope  Rate: 70  Rhythm: sinus Axis: normal Other: non specific st changes, no acute ischemia ____________________________________________  RADIOLOGY   ____________________________________________   PROCEDURES  Procedure(s) performed: none Procedures    Critical Care performed: no ____________________________________________   INITIAL IMPRESSION / ASSESSMENT AND PLAN / ED COURSE  Pertinent labs & imaging results that were available during my care of the patient were reviewed by me and considered in my medical decision making (see chart for details).  DDX: seizure, hyponatremia, hypoglycemia,  dysrhythmia, sdh, iph  MORGEN LINEBAUGH is a 55 y.o. who presents to the ED with seizure-like episode. She arrives hemodynamic stable. No respiratory distress. Will order CT imaging given head injury after seizure episode. We'll continue to monitor in the ER and check for anti elliptic drug levels .  The patient will be placed on continuous pulse oximetry and telemetry for monitoring.  Laboratory evaluation will be sent to evaluate for the above complaints.    Clinical Course as of Aug 22 2117  Thu Aug 22, 2016  1340 Dilantin level was undetectable. Renal function appears normal. Patient is adamant that she is on Dilantin.    [PR]  1405 Patient ambulating in hall without distress.   [PR]  1500 Patients group home caregiver arrives with paper work confirming that the patient  does not take any dilantin.    [PR]  1520 Patient ambulated without hypoxia.  Earlier low reading likely artifact as she has no SOB or respiratory symptoms.  [PR]  1525 Patient was observed in the ER for over 4 hours. She's been hemodynamic stable. No evidence of respiratory distress. Neuro exam is reassuring. Evaluation in the ER is also reassuring. Patient here with caregiver states that she has a roughly one of these episodes a year. Not feel the patient would benefit from inpatient hospitalization at this point she is otherwise well appearing. We'll provide referral for outpatient neurology. Discussed signs and symptoms for which they should return to the ER.  Patient was able to tolerate PO and was able to ambulate with a steady gait.  Have discussed with the patient and available family all diagnostics and treatments performed thus far and all questions were answered to the best of my ability. The patient demonstrates understanding and agreement with plan.   [PR]    Clinical Course User Index [PR] Willy Eddy, MD     ____________________________________________   FINAL CLINICAL IMPRESSION(S) / ED DIAGNOSES  Final diagnoses:  Seizure-like activity (HCC)      NEW MEDICATIONS STARTED DURING THIS VISIT:  New Prescriptions   No medications on file     Note:  This document was prepared using Dragon voice recognition software and may include unintentional dictation errors.    Willy Eddy, MD 08/22/16 2118

## 2016-10-15 ENCOUNTER — Ambulatory Visit (INDEPENDENT_AMBULATORY_CARE_PROVIDER_SITE_OTHER): Payer: Medicare Other | Admitting: Podiatry

## 2016-10-15 DIAGNOSIS — L608 Other nail disorders: Secondary | ICD-10-CM

## 2016-10-15 DIAGNOSIS — L603 Nail dystrophy: Secondary | ICD-10-CM

## 2016-10-15 DIAGNOSIS — B351 Tinea unguium: Secondary | ICD-10-CM | POA: Diagnosis not present

## 2016-10-15 DIAGNOSIS — M79609 Pain in unspecified limb: Secondary | ICD-10-CM

## 2016-10-20 NOTE — Progress Notes (Signed)
   SUBJECTIVE Patient  presents to office today complaining of elongated, thickened nails. Pain while ambulating in shoes. Patient is unable to trim their own nails.   OBJECTIVE General Patient is awake, alert, and oriented x 3 and in no acute distress. Derm Skin is dry and supple bilateral. Negative open lesions or macerations. Remaining integument unremarkable. Nails are tender, long, thickened and dystrophic with subungual debris, consistent with onychomycosis, 1-5 bilateral. No signs of infection noted. Vasc  DP and PT pedal pulses palpable bilaterally. Temperature gradient within normal limits.  Neuro Epicritic and protective threshold sensation diminished bilaterally.  Musculoskeletal Exam No symptomatic pedal deformities noted bilateral. Muscular strength within normal limits.  ASSESSMENT 1. Onychodystrophic nails 1-5 bilateral with hyperkeratosis of nails.  2. Onychomycosis of nail due to dermatophyte bilateral 3. Pain in foot bilateral  PLAN OF CARE 1. Patient evaluated today.  2. Instructed to maintain good pedal hygiene and foot care.  3. Mechanical debridement of nails 1-5 bilaterally performed using a nail nipper. Filed with dremel without incident.  4. Return to clinic in 3 mos.    Alyxandria Wentz M. Ellyson Rarick, DPM [facility]  Dr. Makayela Secrest M. Zohar Maroney, DPM    [address]                                        [place], [place] [address]                Office [phone]  Fax [phone]      

## 2017-01-27 ENCOUNTER — Ambulatory Visit: Payer: Medicare Other | Admitting: Podiatry

## 2017-03-15 ENCOUNTER — Inpatient Hospital Stay
Admission: EM | Admit: 2017-03-15 | Discharge: 2017-03-17 | DRG: 189 | Disposition: A | Discharge status: Home Health | Payer: Medicare Other | Attending: Internal Medicine | Admitting: Internal Medicine

## 2017-03-15 ENCOUNTER — Emergency Department: Payer: Medicare Other

## 2017-03-15 ENCOUNTER — Encounter: Payer: Self-pay | Admitting: Emergency Medicine

## 2017-03-15 DIAGNOSIS — Z7951 Long term (current) use of inhaled steroids: Secondary | ICD-10-CM | POA: Diagnosis not present

## 2017-03-15 DIAGNOSIS — J9621 Acute and chronic respiratory failure with hypoxia: Secondary | ICD-10-CM | POA: Diagnosis present

## 2017-03-15 DIAGNOSIS — Z9981 Dependence on supplemental oxygen: Secondary | ICD-10-CM | POA: Diagnosis not present

## 2017-03-15 DIAGNOSIS — E875 Hyperkalemia: Secondary | ICD-10-CM | POA: Diagnosis present

## 2017-03-15 DIAGNOSIS — J9620 Acute and chronic respiratory failure, unspecified whether with hypoxia or hypercapnia: Secondary | ICD-10-CM | POA: Diagnosis present

## 2017-03-15 DIAGNOSIS — R0902 Hypoxemia: Secondary | ICD-10-CM

## 2017-03-15 DIAGNOSIS — Z23 Encounter for immunization: Secondary | ICD-10-CM

## 2017-03-15 DIAGNOSIS — G40909 Epilepsy, unspecified, not intractable, without status epilepticus: Secondary | ICD-10-CM | POA: Diagnosis present

## 2017-03-15 DIAGNOSIS — I5032 Chronic diastolic (congestive) heart failure: Secondary | ICD-10-CM | POA: Diagnosis present

## 2017-03-15 DIAGNOSIS — F0390 Unspecified dementia without behavioral disturbance: Secondary | ICD-10-CM | POA: Diagnosis present

## 2017-03-15 DIAGNOSIS — Z888 Allergy status to other drugs, medicaments and biological substances status: Secondary | ICD-10-CM | POA: Diagnosis not present

## 2017-03-15 DIAGNOSIS — Z79899 Other long term (current) drug therapy: Secondary | ICD-10-CM

## 2017-03-15 DIAGNOSIS — J441 Chronic obstructive pulmonary disease with (acute) exacerbation: Secondary | ICD-10-CM

## 2017-03-15 DIAGNOSIS — E039 Hypothyroidism, unspecified: Secondary | ICD-10-CM | POA: Diagnosis present

## 2017-03-15 DIAGNOSIS — Z87891 Personal history of nicotine dependence: Secondary | ICD-10-CM | POA: Diagnosis not present

## 2017-03-15 DIAGNOSIS — F319 Bipolar disorder, unspecified: Secondary | ICD-10-CM | POA: Diagnosis present

## 2017-03-15 DIAGNOSIS — Z880 Allergy status to penicillin: Secondary | ICD-10-CM

## 2017-03-15 DIAGNOSIS — I110 Hypertensive heart disease with heart failure: Secondary | ICD-10-CM | POA: Diagnosis present

## 2017-03-15 HISTORY — DX: Unspecified dementia, unspecified severity, without behavioral disturbance, psychotic disturbance, mood disturbance, and anxiety: F03.90

## 2017-03-15 HISTORY — DX: Chronic obstructive pulmonary disease, unspecified: J44.9

## 2017-03-15 LAB — CBC WITH DIFFERENTIAL/PLATELET
BASOS ABS: 0.1 10*3/uL (ref 0–0.1)
Basophils Relative: 1 %
EOS PCT: 2 %
Eosinophils Absolute: 0.2 10*3/uL (ref 0–0.7)
HCT: 39.5 % (ref 35.0–47.0)
HEMOGLOBIN: 12.4 g/dL (ref 12.0–16.0)
LYMPHS PCT: 17 %
Lymphs Abs: 1.9 10*3/uL (ref 1.0–3.6)
MCH: 27.2 pg (ref 26.0–34.0)
MCHC: 31.3 g/dL — ABNORMAL LOW (ref 32.0–36.0)
MCV: 86.8 fL (ref 80.0–100.0)
Monocytes Absolute: 0.9 10*3/uL (ref 0.2–0.9)
Monocytes Relative: 8 %
NEUTROS PCT: 72 %
Neutro Abs: 7.9 10*3/uL — ABNORMAL HIGH (ref 1.4–6.5)
PLATELETS: 228 10*3/uL (ref 150–440)
RBC: 4.55 MIL/uL (ref 3.80–5.20)
RDW: 16.6 % — ABNORMAL HIGH (ref 11.5–14.5)
WBC: 11 10*3/uL (ref 3.6–11.0)

## 2017-03-15 LAB — BASIC METABOLIC PANEL
ANION GAP: 11 (ref 5–15)
BUN: 14 mg/dL (ref 6–20)
CHLORIDE: 106 mmol/L (ref 101–111)
CO2: 21 mmol/L — ABNORMAL LOW (ref 22–32)
Calcium: 9.4 mg/dL (ref 8.9–10.3)
Creatinine, Ser: 1.27 mg/dL — ABNORMAL HIGH (ref 0.44–1.00)
GFR calc Af Amer: 54 mL/min — ABNORMAL LOW (ref >60)
GFR, EST NON AFRICAN AMERICAN: 46 mL/min — AB (ref >60)
GLUCOSE: 189 mg/dL — AB (ref 65–99)
POTASSIUM: 5 mmol/L (ref 3.5–5.1)
SODIUM: 138 mmol/L (ref 135–145)

## 2017-03-15 LAB — BLOOD GAS, ARTERIAL
ACID-BASE DEFICIT: 0 mmol/L (ref 0.0–2.0)
BICARBONATE: 28.3 mmol/L — AB (ref 20.0–28.0)
EXPIRATORY PAP: 6
FIO2: 0.6
INSPIRATORY PAP: 14
Mode: POSITIVE
O2 SAT: 99.4 %
PATIENT TEMPERATURE: 37
PO2 ART: 179 mmHg — AB (ref 83.0–108.0)
RATE: 8 resp/min
pCO2 arterial: 63 mmHg — ABNORMAL HIGH (ref 32.0–48.0)
pH, Arterial: 7.26 — ABNORMAL LOW (ref 7.350–7.450)

## 2017-03-15 LAB — MRSA PCR SCREENING: MRSA by PCR: NEGATIVE

## 2017-03-15 LAB — TROPONIN I

## 2017-03-15 LAB — GLUCOSE, CAPILLARY: Glucose-Capillary: 181 mg/dL — ABNORMAL HIGH (ref 65–99)

## 2017-03-15 MED ORDER — OMEGA-3-ACID ETHYL ESTERS 1 G PO CAPS
1.0000 g | ORAL_CAPSULE | Freq: Every day | ORAL | Status: DC
  Administered 2017-03-16 – 2017-03-17 (×2): 1 g via ORAL
  Filled 2017-03-15 (×2): qty 1

## 2017-03-15 MED ORDER — RISPERIDONE 1 MG PO TABS
1.0000 mg | ORAL_TABLET | Freq: Two times a day (BID) | ORAL | Status: DC
  Administered 2017-03-15 – 2017-03-17 (×4): 1 mg via ORAL
  Filled 2017-03-15 (×4): qty 1

## 2017-03-15 MED ORDER — LEVOFLOXACIN 500 MG PO TABS
500.0000 mg | ORAL_TABLET | Freq: Every day | ORAL | Status: DC
  Administered 2017-03-16: 500 mg via ORAL
  Filled 2017-03-15: qty 1

## 2017-03-15 MED ORDER — DEXTROSE 5 % IV SOLN
500.0000 mg | Freq: Once | INTRAVENOUS | Status: AC
  Administered 2017-03-15: 500 mg via INTRAVENOUS
  Filled 2017-03-15: qty 500

## 2017-03-15 MED ORDER — BUSPIRONE HCL 5 MG PO TABS
15.0000 mg | ORAL_TABLET | Freq: Two times a day (BID) | ORAL | Status: DC
  Administered 2017-03-15 – 2017-03-17 (×4): 15 mg via ORAL
  Filled 2017-03-15 (×2): qty 3
  Filled 2017-03-15 (×2): qty 1

## 2017-03-15 MED ORDER — BUDESONIDE 0.25 MG/2ML IN SUSP
0.2500 mg | Freq: Two times a day (BID) | RESPIRATORY_TRACT | Status: DC
Start: 2017-03-15 — End: 2017-03-16
  Administered 2017-03-16: 0.25 mg via RESPIRATORY_TRACT
  Filled 2017-03-15 (×2): qty 2

## 2017-03-15 MED ORDER — MOMETASONE FURO-FORMOTEROL FUM 100-5 MCG/ACT IN AERO
2.0000 | INHALATION_SPRAY | Freq: Two times a day (BID) | RESPIRATORY_TRACT | Status: DC
  Administered 2017-03-15 – 2017-03-17 (×4): 2 via RESPIRATORY_TRACT
  Filled 2017-03-15 (×2): qty 8.8

## 2017-03-15 MED ORDER — PNEUMOCOCCAL VAC POLYVALENT 25 MCG/0.5ML IJ INJ
0.5000 mL | INJECTION | INTRAMUSCULAR | Status: AC
  Administered 2017-03-16: 0.5 mL via INTRAMUSCULAR
  Filled 2017-03-15: qty 0.5

## 2017-03-15 MED ORDER — BENZONATATE 100 MG PO CAPS: 100.0000 mg | ORAL_CAPSULE | Freq: Three times a day (TID) | ORAL | Status: DC | PRN

## 2017-03-15 MED ORDER — LITHIUM CARBONATE 300 MG PO CAPS
300.0000 mg | ORAL_CAPSULE | Freq: Two times a day (BID) | ORAL | Status: DC
  Administered 2017-03-16 – 2017-03-17 (×3): 300 mg via ORAL
  Filled 2017-03-15 (×5): qty 1

## 2017-03-15 MED ORDER — HEPARIN SODIUM (PORCINE) 5000 UNIT/ML IJ SOLN
5000.0000 [IU] | Freq: Three times a day (TID) | INTRAMUSCULAR | Status: DC
  Administered 2017-03-15 – 2017-03-16 (×2): 5000 [IU] via SUBCUTANEOUS
  Filled 2017-03-15 (×2): qty 1

## 2017-03-15 MED ORDER — FLUTICASONE PROPIONATE 50 MCG/ACT NA SUSP
1.0000 | Freq: Every day | NASAL | Status: DC
  Administered 2017-03-16: 1 via NASAL
  Filled 2017-03-15: qty 16

## 2017-03-15 MED ORDER — TIOTROPIUM BROMIDE MONOHYDRATE 18 MCG IN CAPS
18.0000 ug | ORAL_CAPSULE | Freq: Every day | RESPIRATORY_TRACT | Status: DC
  Administered 2017-03-16 – 2017-03-17 (×2): 18 ug via RESPIRATORY_TRACT
  Filled 2017-03-15 (×2): qty 5

## 2017-03-15 MED ORDER — ORAL CARE MOUTH RINSE: 15.0000 mL | Freq: Two times a day (BID) | OROMUCOSAL | Status: DC

## 2017-03-15 MED ORDER — LEVOTHYROXINE SODIUM 100 MCG PO TABS
100.0000 ug | ORAL_TABLET | Freq: Every day | ORAL | Status: DC
  Administered 2017-03-16 – 2017-03-17 (×2): 100 ug via ORAL
  Filled 2017-03-15 (×2): qty 1

## 2017-03-15 MED ORDER — METHYLPREDNISOLONE SODIUM SUCC 125 MG IJ SOLR
125.0000 mg | Freq: Once | INTRAMUSCULAR | Status: AC
  Administered 2017-03-15: 125 mg via INTRAVENOUS
  Filled 2017-03-15: qty 2

## 2017-03-15 MED ORDER — CARVEDILOL 3.125 MG PO TABS
3.1250 mg | ORAL_TABLET | Freq: Two times a day (BID) | ORAL | Status: DC
  Administered 2017-03-16 – 2017-03-17 (×3): 3.125 mg via ORAL
  Filled 2017-03-15 (×3): qty 1

## 2017-03-15 MED ORDER — MAGNESIUM SULFATE 2 GM/50ML IV SOLN
2.0000 g | Freq: Once | INTRAVENOUS | Status: AC
  Administered 2017-03-15: 2 g via INTRAVENOUS
  Filled 2017-03-15: qty 50

## 2017-03-15 MED ORDER — DOCUSATE SODIUM 100 MG PO CAPS: 100.0000 mg | ORAL_CAPSULE | Freq: Two times a day (BID) | ORAL | Status: DC | PRN

## 2017-03-15 MED ORDER — LORATADINE 10 MG PO TABS
10.0000 mg | ORAL_TABLET | Freq: Every day | ORAL | Status: DC
  Administered 2017-03-16 – 2017-03-17 (×2): 10 mg via ORAL
  Filled 2017-03-15 (×2): qty 1

## 2017-03-15 MED ORDER — METHYLPREDNISOLONE SODIUM SUCC 125 MG IJ SOLR
60.0000 mg | Freq: Four times a day (QID) | INTRAMUSCULAR | Status: DC
  Administered 2017-03-15 – 2017-03-16 (×3): 60 mg via INTRAVENOUS
  Filled 2017-03-15 (×3): qty 2

## 2017-03-15 MED ORDER — IPRATROPIUM-ALBUTEROL 0.5-2.5 (3) MG/3ML IN SOLN
3.0000 mL | RESPIRATORY_TRACT | Status: DC
  Administered 2017-03-15 – 2017-03-16 (×4): 3 mL via RESPIRATORY_TRACT
  Filled 2017-03-15 (×4): qty 3

## 2017-03-15 MED ORDER — IPRATROPIUM-ALBUTEROL 0.5-2.5 (3) MG/3ML IN SOLN
9.0000 mL | Freq: Once | RESPIRATORY_TRACT | Status: AC
  Administered 2017-03-15: 9 mL via RESPIRATORY_TRACT
  Filled 2017-03-15: qty 9

## 2017-03-15 MED ORDER — CHLORHEXIDINE GLUCONATE 0.12 % MT SOLN
15.0000 mL | Freq: Two times a day (BID) | OROMUCOSAL | Status: DC
  Administered 2017-03-15: 15 mL via OROMUCOSAL

## 2017-03-15 MED ORDER — BENZTROPINE MESYLATE 0.5 MG PO TABS
1.0000 mg | ORAL_TABLET | Freq: Two times a day (BID) | ORAL | Status: DC
  Administered 2017-03-15 – 2017-03-17 (×4): 1 mg via ORAL
  Filled 2017-03-15: qty 2
  Filled 2017-03-15 (×2): qty 1
  Filled 2017-03-15: qty 2

## 2017-03-15 NOTE — ED Provider Notes (Signed)
[facility] Emergency Department Provider Note  ____________________________________________   First MD Initiated Contact with Patient 03/15/17 1439     (approximate)  I have reviewed the triage vital signs and the nursing notes.   HISTORY  Chief Complaint Shortness of Breath   HPI Tammy [name] is a 56 y.o. female with a history of COPD who is presenting to the emergency department today with respiratory distress. She says that she has been having difficulty breathing ever since this morning which has been worsening over the course the day. She says that it is been associated with a cough but without any sputum production. She does not report any fever. Reports mild, tightness or upper back which she says has worsened throughout the day as her breathing has worsened. She says that the tightness started after the breathing difficulty.   Past Medical History:  Diagnosis Date  . Asthma   . Bipolar 1 disorder (HCC)   . CHF (congestive heart failure) (HCC)   . COPD (chronic obstructive pulmonary disease) (HCC)   . Epilepsy (HCC)   . Seizures (HCC)     There are no active problems to display for this patient.   History reviewed. No pertinent surgical history.  Prior to Admission medications   Medication Sig Start Date End Date Taking? Authorizing Provider  albuterol (PROVENTIL HFA;VENTOLIN HFA) 108 (90 BASE) MCG/ACT inhaler Inhale 4-6 puffs by mouth every 4 hours as needed for wheezing, cough, and/or shortness of breath 07/01/15   Loleta RoseForbach, Cory, MD  benzonatate (TESSALON) 100 MG capsule Take 100 mg by mouth 3 (three) times daily as needed for cough.    [provider]  benztropine (COGENTIN) 1 MG tablet Take 1 tablet (1 mg total) by mouth 2 (two) times daily. 07/03/15   Emily FilbertWilliams, Jonathan E, MD  benztropine (COGENTIN) 1 MG tablet Take 1 tablet (1 mg total) by mouth 2 (two) times daily. 07/03/15 07/02/16  Emily FilbertWilliams, Jonathan E, MD    budesonide-formoterol (SYMBICORT) 80-4.5 MCG/ACT inhaler Inhale 2 puffs into the lungs 2 (two) times daily.    [provider]  busPIRone (BUSPAR) 10 MG tablet Take 15 mg by mouth 2 (two) times daily.     [provider]  carvedilol (COREG) 3.125 MG tablet Take 3.125 mg by mouth 2 (two) times daily with a meal.    [provider]  cetirizine (ZYRTEC) 10 MG tablet Take 10 mg by mouth daily.    [provider]  Cholecalciferol (VITAMIN D3) 5000 units CAPS Take 1 capsule by mouth daily.    [provider]  fluticasone (FLONASE) 50 MCG/ACT nasal spray Place into both nostrils daily.    [provider]  Levothyroxine Sodium 100 MCG CAPS Take 100 mcg by mouth daily before breakfast.     [provider]  lithium carbonate 300 MG capsule Take 300 mg by mouth 2 (two) times daily with a meal.    [provider]  loratadine (CLARITIN) 10 MG tablet Take 10 mg by mouth.    [provider]  mometasone (ELOCON) 0.1 % cream Apply 1 application topically 2 (two) times daily.    [provider]  nitrofurantoin, macrocrystal-monohydrate, (MACROBID) 100 MG capsule Take 1 capsule (100 mg total) by mouth 2 (two) times daily. Patient not taking: Reported on 08/22/2016 07/03/15   Emily FilbertWilliams, Jonathan E, MD  nystatin (MYCOSTATIN) 100000 UNIT/ML suspension Take 5 mLs by mouth 2 (two) times daily.     [provider]  nystatin (  NYSTATIN) powder Apply topically 2 (two) times daily.    [provider]  Omega-3 Fatty Acids (FISH OIL) 1000 MG CAPS Take 1 capsule by mouth 2 (two) times daily.    [provider]  risperiDONE (RISPERDAL) 1 MG tablet Take 1 mg by mouth 2 (two) times daily.     [provider]    Allergies Penicillins and Prednisone  History reviewed. No pertinent family history.  Social History Social History  Substance Use Topics  . Smoking status: Never Smoker  . Smokeless tobacco:  Never Used  . Alcohol use No    Review of Systems  Constitutional: No fever/chills Eyes: No visual changes. ENT: No sore throat. Cardiovascular: Denies chest pain. Respiratory: as above Gastrointestinal: No abdominal pain.  No nausea, no vomiting.  No diarrhea.  No constipation. Genitourinary: Negative for dysuria. Musculoskeletal: Negative for back pain. Skin: Negative for rash. Neurological: Negative for headaches, focal weakness or numbness.   ____________________________________________   PHYSICAL EXAM:  VITAL SIGNS: ED Triage Vitals  Enc Vitals Group     BP 03/15/17 1504 132/89     Pulse Rate 03/15/17 1437 97     Resp 03/15/17 1437 (!) 36     Temp 03/15/17 1440 97 F (36.1 C)     Temp Source 03/15/17 1440 Axillary     SpO2 03/15/17 1437 (!) 48 %     Weight 03/15/17 1437 130 lb (59 kg)     Height 03/15/17 1437 5\' 8"  (1.727 m)     Head Circumference --      Peak Flow --      Pain Score 03/15/17 1437 8     Pain Loc --      Pain Edu? --      Excl. in GC? --     Constitutional: Alert and oriented. Only able to say one to 2 words at a time because of breathing difficulty. Eyes: Conjunctivae are normal.  Head: Atraumatic. Nose: No congestion/rhinnorhea. Mouth/Throat: Mucous membranes are moist.  Neck: No stridor.   Cardiovascular: Normal rate, regular rhythm. Grossly normal heart sounds.   Respiratory: Labored respirations with supraclavicular retractions. Tachypnea. Severely decreased air movement throughout with a prolonged history phase and only minimal wheezing likely related to decreased air movement. Gastrointestinal: Soft and nontender. No distention.  Musculoskeletal: No lower extremity tenderness nor edema.  No joint effusions. Neurologic:  No gross focal neurologic deficits are appreciated. Skin:  Skin is warm, dry and intact. No rash noted. Psychiatric: Mood and affect are normal. Speech and behavior are  normal.  ____________________________________________   LABS (all labs ordered are listed, but only abnormal results are displayed)  Labs Reviewed  CBC WITH DIFFERENTIAL/PLATELET - Abnormal; Notable for the following:       Result Value   MCHC 31.3 (*)    RDW 16.6 (*)    Neutro Abs 7.9 (*)    All other components within normal limits  BASIC METABOLIC PANEL - Abnormal; Notable for the following:    CO2 21 (*)    Glucose, Bld 189 (*)    Creatinine, Ser 1.27 (*)    GFR calc non Af Amer 46 (*)    GFR calc Af Amer 54 (*)    All other components within normal limits  TROPONIN I   ____________________________________________  EKG  ED ECG REPORT I, Arelia Longest, the attending physician, personally viewed and interpreted this ECG.   Date: 03/15/2017  EKG Time: 1446  Rate: 83  Rhythm: normal  sinus rhythm  Axis: Normal  Intervals:none  ST&T Change: No ST segment elevation or depression. T-wave inversions in aVL as well as V2.  ____________________________________________  RADIOLOGY  Appears to be hyperinflated. Otherwise no acute findings. ____________________________________________   PROCEDURES  Procedure(s) performed:   Procedures  Critical Care performed:  CRITICAL CARE Performed by: Arelia Longest   Total critical care time: 35 minutes  Critical care time was exclusive of separately billable procedures and treating other patients.  Critical care was necessary to treat or prevent imminent or life-threatening deterioration.  Critical care was time spent personally by me on the following activities: development of treatment plan with patient and/or surrogate as well as nursing, discussions with consultants, evaluation of patient's response to treatment, examination of patient, obtaining history from patient or surrogate, ordering and performing treatments and interventions, ordering and review of laboratory studies, ordering and review of radiographic  studies, pulse oximetry and re-evaluation of patient's condition.   ____________________________________________   INITIAL IMPRESSION / ASSESSMENT AND PLAN / ED COURSE  Pertinent labs & imaging results that were available during my care of the patient were reviewed by me and considered in my medical decision making (see chart for details).  ----------------------------------------- 3:24 PM on 03/15/2017 -----------------------------------------  Patient tolerating the BiPAP well. Sats continued to be at 100%. Patient will be admitted to the hospital. Signed out to Dr. Elisabeth Pigeon. Patient aware of the need for admission for what appears to be a severe exacerbation of her COPD.      ____________________________________________   FINAL CLINICAL IMPRESSION(S) / ED DIAGNOSES  COPD. Hypoxia.    NEW MEDICATIONS STARTED DURING THIS VISIT:  New Prescriptions   No medications on file     Note:  This document was prepared using Dragon voice recognition software and may include unintentional dictation errors.     Myrna Blazer, MD 03/15/17 1524

## 2017-03-15 NOTE — ED Notes (Addendum)
Ready bed found att, after EMS to room 1  Pt has one IV; attempting second att with report call  Sallye OberLouise, [phone], sister called with primary Dewayne Hatchnn, [phone]  Call Ann first for info

## 2017-03-15 NOTE — ED Notes (Signed)
Caregiver, Jerrye BeaversHazel, [phone], going back to facility, will return for admission

## 2017-03-15 NOTE — ED Triage Notes (Signed)
Pt c/o SHOB that started today. From community care [facility] home. Given breathing tx without relief. Labored. Cannot speak in full sentences. RT and dr Pershing Proudschaevitz notified of pt

## 2017-03-15 NOTE — ED Notes (Signed)
Caregiver, HAZel, leaving to go back to facility, reports will return for admission, [phone] cell

## 2017-03-15 NOTE — ED Notes (Signed)
Mag not given d/t small pheriphery line and propensity to irritate on IV site

## 2017-03-15 NOTE — ED Notes (Signed)
XR notified. Hazel from [facility] present.  Several attempts from multiple staff members to place IV.  Ok by Dr. Pershing ProudSchaevitz for leg or foot IV.

## 2017-03-15 NOTE — H&P (Addendum)
[facility] [place] at [facility]   PATIENT NAME: Tammy [name]    MR#:  [id number]  DATE OF BIRTH:  Sep 01, 1961  DATE OF ADMISSION:  03/15/2017  PRIMARY CARE PHYSICIAN: System, Provider Not In   REQUESTING/REFERRING PHYSICIAN: Schaevitz  CHIEF COMPLAINT:   Chief Complaint  Patient presents with  . Shortness of Breath    HISTORY OF PRESENT ILLNESS: Tammy [name]  is a 56 y.o. female with a known history of Asthma, bipolar disorder, CHF, COPD, epilepsy, seizures- lives at a group home and on oxygen as needed basis. For last 3-4 days she has some more coughing and shortness of breath, for that the group home staff tried inhalers and nebulizer treatment and also giving her oxygen use but her condition is getting worse and she was more wheezing and gasping for air so sent to emergency room today. In ER she was noted to be hypoxic and high respiratory rate so she was finally started on BiPAP and given to hospitalist for further management.  PAST MEDICAL HISTORY:   Past Medical History:  Diagnosis Date  . Asthma   . Bipolar 1 disorder (HCC)   . CHF (congestive heart failure) (HCC)   . COPD (chronic obstructive pulmonary disease) (HCC)   . Dementia   . Epilepsy (HCC)   . Seizures (HCC)     PAST SURGICAL HISTORY: History reviewed. No pertinent surgical history.  SOCIAL HISTORY:  Social History  Substance Use Topics  . Smoking status: Former Games developer  . Smokeless tobacco: Never Used  . Alcohol use No    FAMILY HISTORY:  Family History  Problem Relation Age of Onset  . Hypertension Mother     DRUG ALLERGIES:  Allergies  Allergen Reactions  . Penicillins Rash  . Prednisone Rash    REVIEW OF SYSTEMS:   CONSTITUTIONAL: No fever, fatigue or weakness.  EYES: No blurred or double vision.  EARS, NOSE, AND THROAT: No tinnitus or ear pain.  RESPIRATORY: Positive for cough, shortness of breath, wheezing ,no hemoptysis.  CARDIOVASCULAR: No chest pain,  orthopnea, edema.  GASTROINTESTINAL: No nausea, vomiting, diarrhea or abdominal pain.  GENITOURINARY: No dysuria, hematuria.  ENDOCRINE: No polyuria, nocturia,  HEMATOLOGY: No anemia, easy bruising or bleeding SKIN: No rash or lesion. MUSCULOSKELETAL: No joint pain or arthritis.   NEUROLOGIC: No tingling, numbness, weakness.  PSYCHIATRY: No anxiety or depression.   MEDICATIONS AT HOME:  Prior to Admission medications   Medication Sig Start Date End Date Taking? Authorizing Provider  albuterol (PROVENTIL HFA;VENTOLIN HFA) 108 (90 BASE) MCG/ACT inhaler Inhale 4-6 puffs by mouth every 4 hours as needed for wheezing, cough, and/or shortness of breath 07/01/15  Yes Loleta Rose, MD  benzonatate (TESSALON) 100 MG capsule Take 100 mg by mouth 3 (three) times daily as needed for cough.   Yes [provider]  benztropine (COGENTIN) 1 MG tablet Take 1 tablet (1 mg total) by mouth 2 (two) times daily. 07/03/15 03/15/17 Yes Emily Filbert, MD  budesonide-formoterol (SYMBICORT) 80-4.5 MCG/ACT inhaler Inhale 2 puffs into the lungs 2 (two) times daily.   Yes [provider]  busPIRone (BUSPAR) 15 MG tablet Take 15 mg by mouth 2 (two) times daily.    Yes [provider]  carvedilol (COREG) 3.125 MG tablet Take 3.125 mg by mouth 2 (two) times daily with a meal.   Yes [provider]  cetirizine (ZYRTEC) 10 MG tablet Take 10 mg by mouth daily.   Yes [provider]  Cholecalciferol (VITAMIN  D3) 5000 units CAPS Take 1 capsule by mouth daily.   Yes [provider]  fluticasone (FLONASE) 50 MCG/ACT nasal spray Place 1 spray into both nostrils daily.    Yes [provider]  Levothyroxine Sodium 100 MCG CAPS Take 100 mcg by mouth daily before breakfast.    Yes [provider]  lithium carbonate 300 MG capsule Take 300 mg by mouth 2 (two) times daily with a meal.   Yes [provider]  Omega-3 Fatty Acids (FISH OIL) 1000 MG CAPS Take  1 capsule by mouth 2 (two) times daily.   Yes [provider]  risperiDONE (RISPERDAL) 1 MG tablet Take 1 mg by mouth 2 (two) times daily.    Yes [provider]      PHYSICAL EXAMINATION:   VITAL SIGNS: Blood pressure (!) 142/86, pulse 76, temperature 97 F (36.1 C), temperature source Axillary, resp. rate 16, height 5\' 8"  (1.727 m), weight 59 kg (130 lb), SpO2 100 %.  GENERAL:  56 y.o.-year-old patient lying in the bed with no acute distress.  EYES: Pupils equal, round, reactive to light and accommodation. No scleral icterus. Extraocular muscles intact.  HEENT: Head atraumatic, normocephalic. Oropharynx and nasopharynx clear.  NECK:  Supple, no jugular venous distention. No thyroid enlargement, no tenderness.  LUNGS: Normal breath sounds bilaterally, somewhat wheezing, no  crepitation. Positive use of accessory muscles of respiration. Using BiPAP currently.  CARDIOVASCULAR: S1, S2 normal. No murmurs, rubs, or gallops.  ABDOMEN: Soft, nontender, nondistended. Bowel sounds present. No organomegaly or mass.  EXTREMITIES: No pedal edema, cyanosis, or clubbing.  NEUROLOGIC: Cranial nerves II through XII are intact. Muscle strength 5/5 in all extremities. Sensation intact. Gait not checked.  PSYCHIATRIC: The patient is alert and oriented x 3.  SKIN: No obvious rash, lesion, or ulcer.   LABORATORY PANEL:   CBC  Recent Labs Lab 03/15/17 1441  WBC 11.0  HGB 12.4  HCT 39.5  PLT 228  MCV 86.8  MCH 27.2  MCHC 31.3*  RDW 16.6*  LYMPHSABS 1.9  MONOABS 0.9  EOSABS 0.2  BASOSABS 0.1   ------------------------------------------------------------------------------------------------------------------  Chemistries   Recent Labs Lab 03/15/17 1441  NA 138  K 5.0  CL 106  CO2 21*  GLUCOSE 189*  BUN 14  CREATININE 1.27*  CALCIUM 9.4   ------------------------------------------------------------------------------------------------------------------ estimated  creatinine clearance is 46.1 mL/min (A) (by C-G formula based on SCr of 1.27 mg/dL (H)). ------------------------------------------------------------------------------------------------------------------ No results for input(s): TSH, T4TOTAL, T3FREE, THYROIDAB in the last 72 hours.  Invalid input(s): FREET3   Coagulation profile No results for input(s): INR, PROTIME in the last 168 hours. ------------------------------------------------------------------------------------------------------------------- No results for input(s): DDIMER in the last 72 hours. -------------------------------------------------------------------------------------------------------------------  Cardiac Enzymes  Recent Labs Lab 03/15/17 1441  TROPONINI <0.03   ------------------------------------------------------------------------------------------------------------------ Invalid input(s): POCBNP  ---------------------------------------------------------------------------------------------------------------  Urinalysis    Component Value Date/Time   COLORURINE YELLOW (A) 08/25/2015 1206   APPEARANCEUR CLEAR (A) 08/25/2015 1206   LABSPEC 1.009 08/25/2015 1206   PHURINE 6.0 08/25/2015 1206   GLUCOSEU NEGATIVE 08/25/2015 1206   HGBUR NEGATIVE 08/25/2015 1206   BILIRUBINUR NEGATIVE 08/25/2015 1206   KETONESUR NEGATIVE 08/25/2015 1206   PROTEINUR NEGATIVE 08/25/2015 1206   NITRITE NEGATIVE 08/25/2015 1206   LEUKOCYTESUR TRACE (A) 08/25/2015 1206     RADIOLOGY: Dg Chest 1 View  Result Date: 03/15/2017 CLINICAL DATA:  Shortness of breath today.  History of COPD. EXAM: CHEST 1 VIEW COMPARISON:  PA and lateral chest 07/01/2015. FINDINGS: There is cardiomegaly without edema.  Atherosclerosis is noted. No consolidative process, pneumothorax or effusion. IMPRESSION: Cardiomegaly without acute disease. Atherosclerosis. Electronically Signed   By: Drusilla Kannerhomas  Dalessio M.D.   On: 03/15/2017 15:19    EKG: Orders  placed or performed during the hospital encounter of 03/15/17  . ED EKG  . ED EKG  . EKG 12-Lead  . EKG 12-Lead  . EKG 12-Lead  . EKG 12-Lead    IMPRESSION AND PLAN:  * Acute on chronic respiratory failure with hypoxia   COPD exacerbation.    IV and inhaled steroid, nebulizer therapy, antibiotic, Spiriva.   Monitor in stepdown unit with BiPAP use currently and intensivist consult to help tapering oxygen.  * Hypertension   Continue carvedilol.  * Bipolar and depression   Continue lithium, risperidone.  * Hypothyroidism   Continue levothyroxine.  All the records are reviewed and case discussed with ED provider. Management plans discussed with the patient, family and they are in agreement.  CODE STATUS: Full code Code Status History    This patient does not have a recorded code status. Please follow your organizational policy for patients in this situation.     He presented in the form was present in the room and they aren't going to call the patient's family member to let them know about her being in the hospital.  TOTAL TIME TAKING CARE OF THIS PATIENT: 45 critical care minutes.    Altamese DillingVACHHANI, Morayma [place]own M.D on 03/15/2017   Between 7am to 6pm - Pager - [phone]  After 6pm go to [email or web address] - password [email or web address]  [facility]  Office  [phone]  CC: Primary care physician; System, Provider Not In   Note: This dictation was prepared with Dragon dictation along with smaller phrase technology. Any transcriptional errors that result from this process are unintentional.

## 2017-03-15 NOTE — ED Notes (Signed)
Report to Noel RN

## 2017-03-15 NOTE — Progress Notes (Signed)
[facility]giver [facility] [address] in [place] came to visit patient along with several "friends" from the home.  Ms. Jerrye BeaversHazel left a copy of the [place] Medicaid Program Long Term Care Services form.  I placed it in the chart.

## 2017-03-16 DIAGNOSIS — J9621 Acute and chronic respiratory failure with hypoxia: Principal | ICD-10-CM

## 2017-03-16 DIAGNOSIS — J441 Chronic obstructive pulmonary disease with (acute) exacerbation: Secondary | ICD-10-CM

## 2017-03-16 LAB — RAPID HIV SCREEN (HIV 1/2 AB+AG)
HIV 1/2 ANTIBODIES: NONREACTIVE
HIV-1 P24 Antigen - HIV24: NONREACTIVE

## 2017-03-16 LAB — BASIC METABOLIC PANEL
Anion gap: 5 (ref 5–15)
BUN: 14 mg/dL (ref 6–20)
CHLORIDE: 108 mmol/L (ref 101–111)
CO2: 27 mmol/L (ref 22–32)
Calcium: 9.1 mg/dL (ref 8.9–10.3)
Creatinine, Ser: 0.87 mg/dL (ref 0.44–1.00)
GFR calc Af Amer: >60 mL/min (ref >60)
Glucose, Bld: 219 mg/dL — ABNORMAL HIGH (ref 65–99)
POTASSIUM: 5.3 mmol/L — AB (ref 3.5–5.1)
SODIUM: 140 mmol/L (ref 135–145)

## 2017-03-16 LAB — CBC
HEMATOCRIT: 41.1 % (ref 35.0–47.0)
Hemoglobin: 12.9 g/dL (ref 12.0–16.0)
MCH: 27.5 pg (ref 26.0–34.0)
MCHC: 31.5 g/dL — ABNORMAL LOW (ref 32.0–36.0)
MCV: 87.3 fL (ref 80.0–100.0)
PLATELETS: 179 10*3/uL (ref 150–440)
RBC: 4.71 MIL/uL (ref 3.80–5.20)
RDW: 16.3 % — AB (ref 11.5–14.5)
WBC: 9.3 10*3/uL (ref 3.6–11.0)

## 2017-03-16 MED ORDER — MOMETASONE FURO-FORMOTEROL FUM 100-5 MCG/ACT IN AERO: 2.0000 | INHALATION_SPRAY | Freq: Two times a day (BID) | RESPIRATORY_TRACT | Status: DC

## 2017-03-16 MED ORDER — IPRATROPIUM-ALBUTEROL 0.5-2.5 (3) MG/3ML IN SOLN
3.0000 mL | RESPIRATORY_TRACT | Status: DC | PRN
  Administered 2017-03-16: 3 mL via RESPIRATORY_TRACT
  Filled 2017-03-16: qty 3

## 2017-03-16 MED ORDER — SODIUM POLYSTYRENE SULFONATE 15 GM/60ML PO SUSP
30.0000 g | Freq: Once | ORAL | Status: AC
  Administered 2017-03-16: 30 g via ORAL
  Filled 2017-03-16: qty 120

## 2017-03-16 MED ORDER — ENOXAPARIN SODIUM 40 MG/0.4ML ~~LOC~~ SOLN
40.0000 mg | SUBCUTANEOUS | Status: DC
Start: 2017-03-16 — End: 2017-03-17
  Administered 2017-03-16: 40 mg via SUBCUTANEOUS
  Filled 2017-03-16: qty 0.4

## 2017-03-16 MED ORDER — ACETAMINOPHEN 325 MG PO TABS
650.0000 mg | ORAL_TABLET | Freq: Four times a day (QID) | ORAL | Status: DC | PRN
  Administered 2017-03-16 – 2017-03-17 (×2): 650 mg via ORAL
  Filled 2017-03-16 (×3): qty 2

## 2017-03-16 MED ORDER — ONDANSETRON HCL 4 MG/2ML IJ SOLN: 4.0000 mg | Freq: Four times a day (QID) | INTRAMUSCULAR | Status: DC | PRN

## 2017-03-16 MED ORDER — METHYLPREDNISOLONE SODIUM SUCC 40 MG IJ SOLR
20.0000 mg | Freq: Two times a day (BID) | INTRAMUSCULAR | Status: DC
Start: 2017-03-16 — End: 2017-03-17
  Administered 2017-03-16 – 2017-03-17 (×2): 20 mg via INTRAVENOUS
  Filled 2017-03-16 (×2): qty 1

## 2017-03-16 NOTE — NC FL2 (Signed)
MEDICAID FL2 LEVEL OF CARE SCREENING TOOL     IDENTIFICATION  Patient Name: Tammy [name] [facility] Birthdate: Jun 27, 1961 Sex: female Admission Date (Current Location): 03/15/2017  [place] and [place] Number:  Chiropodist and Address:  [facility], [address], [place], [place] [address]      Provider Number: [id number]  Attending Physician Name and Address:  Milagros Loll, MD  Relative Name and Phone Number:       Current Level of Care: Other (Comment) (Group Home) Recommended Level of Care: [facility] Prior Approval Number:    Date Approved/Denied:   PASRR Number: [id number]  Discharge Plan: Other (Comment) (Group Home)    Current Diagnoses: Patient Active Problem List   Diagnosis Date Noted  . Acute on chronic respiratory failure with hypoxia (HCC) 03/15/2017  . COPD with acute exacerbation (HCC) 03/15/2017  . Acute on chronic respiratory failure (HCC) 03/15/2017    Orientation RESPIRATION BLADDER Height & Weight     Self, Time, Situation, Place  O2 (2L o2) Continent Weight: 173 lb 11.6 oz (78.8 kg) Height:  5\' 2"  (157.5 cm)  BEHAVIORAL SYMPTOMS/MOOD NEUROLOGICAL BOWEL NUTRITION STATUS      Continent    AMBULATORY STATUS COMMUNICATION OF NEEDS Skin   Independent Verbally Normal                       Personal Care Assistance Level of Assistance  Bathing, Feeding, Dressing Bathing Assistance: Independent Feeding assistance: Independent Dressing Assistance: Independent     Functional Limitations Info    Sight Info: Adequate        SPECIAL CARE FACTORS FREQUENCY                       Contractures Contractures Info: Present    Additional Factors Info  Allergies   Allergies Info: Penicillins, Prednisone           Current Medications (03/16/2017):  This is the current hospital active medication list Current Facility-Administered Medications  Medication Dose Route Frequency Provider  Last Rate Last Dose  . acetaminophen (TYLENOL) tablet 650 mg  650 mg Oral Q6H PRN Milagros Loll, MD   650 mg at 03/16/17 1245  . benzonatate (TESSALON) capsule 100 mg  100 mg Oral TID PRN Altamese Dilling, MD      . benztropine (COGENTIN) tablet 1 mg  1 mg Oral BID Altamese Dilling, MD   1 mg at 03/16/17 1000  . busPIRone (BUSPAR) tablet 15 mg  15 mg Oral BID Altamese Dilling, MD   15 mg at 03/16/17 0950  . carvedilol (COREG) tablet 3.125 mg  3.125 mg Oral BID WC Altamese Dilling, MD   3.125 mg at 03/16/17 0743  . docusate sodium (COLACE) capsule 100 mg  100 mg Oral BID PRN Altamese Dilling, MD      . enoxaparin (LOVENOX) injection 40 mg  40 mg Subcutaneous Q24H Sudini, Srikar, MD      . fluticasone (FLONASE) 50 MCG/ACT nasal spray 1 spray  1 spray Each Nare Daily Altamese Dilling, MD   1 spray at 03/16/17 0951  . ipratropium-albuterol (DUONEB) 0.5-2.5 (3) MG/3ML nebulizer solution 3 mL  3 mL Nebulization Q4H PRN Merwyn Katos, MD      . levofloxacin [facility]) tablet 500 mg  500 mg Oral Daily Merwyn Katos, MD      . levothyroxine (SYNTHROID, LEVOTHROID) tablet 100 mcg  100 mcg Oral QAC breakfast Altamese Dilling,  MD   100 mcg at 03/16/17 0743  . lithium carbonate capsule 300 mg  300 mg Oral BID WC Altamese DillingVachhani, Vaibhavkumar, MD   300 mg at 03/16/17 0743  . loratadine (CLARITIN) tablet 10 mg  10 mg Oral Daily Altamese DillingVachhani, Vaibhavkumar, MD   10 mg at 03/16/17 0950  . methylPREDNISolone sodium succinate (SOLU-MEDROL) 40 mg/mL injection 20 mg  20 mg Intravenous Q12H Merwyn KatosSimonds, David B, MD      . mometasone-formoterol (DULERA) 100-5 MCG/ACT inhaler 2 puff  2 puff Inhalation BID Altamese DillingVachhani, Vaibhavkumar, MD   2 puff at 03/16/17 0744  . omega-3 acid ethyl esters (LOVAZA) capsule 1 g  1 g Oral Daily Altamese DillingVachhani, Vaibhavkumar, MD   1 g at 03/16/17 0950  . ondansetron (ZOFRAN) injection 4 mg  4 mg Intravenous Q6H PRN Sudini, Wardell HeathSrikar, MD      . risperiDONE (RISPERDAL)  tablet 1 mg  1 mg Oral BID Altamese DillingVachhani, Vaibhavkumar, MD   1 mg at 03/16/17 0950  . tiotropium (SPIRIVA) inhalation capsule 18 mcg  18 mcg Inhalation Daily Altamese DillingVachhani, Vaibhavkumar, MD   18 mcg at 03/16/17 [id number]     Discharge Medications: Please see discharge summary for a list of discharge medications.  Relevant Imaging Results:  Relevant Lab Results:   Additional Information SS# [id number]  Judi CongKaren M Gabby Rackers, LCSW

## 2017-03-16 NOTE — Clinical Social Work Note (Signed)
Clinical Social Work Assessment  Patient Details  Name: Tammy MussBrenda M Hepworth MRN: [id number] Date of Birth: January 22, 1961  Date of referral:  03/16/17               Reason for consult:  Facility Placement                Permission sought to share information with:  Facility Industrial/product designerContact Representative Permission granted to share information::  Yes, Verbal Permission Granted  Name::        Agency::     Relationship::     Contact Information:     Housing/Transportation Living arrangements for the past 2 months:  Group Home Source of Information:  Medical Team Patient Interpreter Needed:  None Criminal Activity/Legal Involvement Pertinent to Current Situation/Hospitalization:  No - Comment as needed Significant Relationships:  None Lives with:  Facility Resident Do you feel safe going back to the place where you live?  Yes Need for family participation in patient care:  No (Coment)  Care giving concerns:  Patient admitted from a group home   Social Worker assessment / plan:  According to the group home representative, the patient can return when stable, and the group home will transport her. The patient has lived at the group home for over a year and has home o2. The patient will most likely dc 6/11.   Employment status:  Retired Health and safety inspectornsurance information:  Medicare PT Recommendations:  Not assessed at this time Information / Referral to community resources:     Patient/Family's Response to care: Facility representative thanked CSW.  Patient/Family's Understanding of and Emotional Response to Diagnosis, Current Treatment, and Prognosis:  The patient's group home representative understands the patient's needs for care.  Emotional Assessment Appearance:  Appears stated age Attitude/Demeanor/Rapport:   (Sleeping) Affect (typically observed):   (Sleeping) Orientation:   (Sleeping) Alcohol / Substance use:  Never Used Psych involvement (Current and /or in the community):  No  (Comment)  Discharge Needs  Concerns to be addressed:  Care Coordination Readmission within the last 30 days:  No Current discharge risk:  None Barriers to Discharge:  Continued Medical Work up   [employer] CorporationKaren M Laura Radilla, LCSW 03/16/2017, 2:58 PM

## 2017-03-16 NOTE — Consult Note (Signed)
PULMONARY CONSULT NOTE  Requesting MD/Service: Hospitalist Date of initial consultation: 06/10 Reason for consultation: Acute respiratory failure, COPD exacerbation  PT PROFILE: 56 y.o. female former smoker admitted 06/09 with COPD exacerbation requiring transient BiPAP  HPI:  He presented with 3-4 days of cough and increasing shortness of breath. In the emergency department she was hypoxic and tachypneic. BiPAP was initiated. At the time of this evaluation, she is comfortable on nasal cannula oxygen with normal respiratory rate and no evidence of distress.  Past Medical History:  Diagnosis Date  . Asthma   . Bipolar 1 disorder (HCC)   . CHF (congestive heart failure) (HCC)   . COPD (chronic obstructive pulmonary disease) (HCC)   . Dementia   . Epilepsy (HCC)   . Seizures (HCC)     History reviewed. No pertinent surgical history.  MEDICATIONS: I have reviewed all medications and confirmed regimen as documented  Social History   Social History  . Marital status: Widowed    Spouse name: N/A  . Number of children: N/A  . Years of education: N/A   Occupational History  . Not on file.   Social History Main Topics  . Smoking status: Former Games developer  . Smokeless tobacco: Never Used  . Alcohol use No  . Drug use: No  . Sexual activity: Not on file   Other Topics Concern  . Not on file   Social History Narrative  . No narrative on file    Family History  Problem Relation Age of Onset  . Hypertension Mother     ROS: No fever, myalgias/arthralgias, unexplained weight loss or weight gain No new focal weakness or sensory deficits No otalgia, hearing loss, visual changes, nasal and sinus symptoms, mouth and throat problems No neck pain or adenopathy No abdominal pain, N/V/D, diarrhea, change in bowel pattern No dysuria, change in urinary pattern   Vitals:   03/16/17 0700 03/16/17 0800 03/16/17 0822 03/16/17 0900  BP: 119/65 124/62  134/70  Pulse: 70 69  78  Resp:  18 14  (!) 21  Temp:      TempSrc:      SpO2: 90% 96% 96% 94%  Weight:      Height:         EXAM:   Gen: WDWN in NAD HEENT: NCAT, sclerae white, oropharynx normal Neck: NO LAN, no JVD noted Lungs: Diminished breath sounds, slightly coarse, no wheezes Cardiovascular: Reg rate, normal rhythm, no M noted Abdomen: Soft, NT, +BS Ext: no C/C/E Neuro: Mild tardive dyskinesia PERRL, EOMI, motor/sensory grossly intact Skin: No lesions noted   DATA:   BMP Latest Ref Rng & Units 03/16/2017 03/15/2017 08/22/2016  Glucose 65 - 99 mg/dL 161(W) 960(A) 93  BUN 6 - 20 mg/dL 14 14 11   Creatinine 0.44 - 1.00 mg/dL 5.40 9.81(X) 9.14(N)  Sodium 135 - 145 mmol/L 140 138 138  Potassium 3.5 - 5.1 mmol/L 5.3(H) 5.0 4.6  Chloride 101 - 111 mmol/L 108 106 103  CO2 22 - 32 mmol/L 27 21(L) 30  Calcium 8.9 - 10.3 mg/dL 9.1 9.4 9.6    CBC Latest Ref Rng & Units 03/16/2017 03/15/2017 08/25/2015  WBC 3.6 - 11.0 K/uL 9.3 11.0 6.2  Hemoglobin 12.0 - 16.0 g/dL 82.9 56.2 11.2(L)  Hematocrit 35.0 - 47.0 % 41.1 39.5 34.6(L)  Platelets 150 - 440 K/uL 179 228 166    CXR 06/09:  No acute cardiac or pulmonary disease  IMPRESSION:     ICD-10-CM   1. COPD exacerbation (  HCC) J44.1   2. Hypoxia R09.02    Appears to be much improved with treatment of COPD exacerbation  PLAN:  Transfer to MedSurg floor Resume ICS/LABA inhaler Continue tiotropium inhaler Continue low-flow oxygen as needed Taper steroids to off over next couple of days After transfer,PCCM will sign off. Please call if we can be of further assistance    Billy Fischeravid Simonds, MD PCCM service Mobile [phone] Pager [phone] 03/16/2017 10:53 AM

## 2017-03-16 NOTE — Progress Notes (Signed)
[facility]   PATIENT NAME: Tammy [name]    MR#:  [id number]  DATE OF BIRTH:  [birth year]  SUBJECTIVE:  CHIEF COMPLAINT:   Chief Complaint  Patient presents with  . Shortness of Breath   SOB improved. Off bipap. On 3 L o2. Has home O2  REVIEW OF SYSTEMS:    Review of Systems  Constitutional: Positive for malaise/fatigue. Negative for chills and fever.  HENT: Negative for sore throat.   Eyes: Negative for blurred vision, double vision and pain.  Respiratory: Positive for cough, shortness of breath and wheezing. Negative for hemoptysis.   Cardiovascular: Negative for chest pain, palpitations, orthopnea and leg swelling.  Gastrointestinal: Negative for abdominal pain, constipation, diarrhea, heartburn, nausea and vomiting.  Genitourinary: Negative for dysuria and hematuria.  Musculoskeletal: Negative for back pain and joint pain.  Skin: Negative for rash.  Neurological: Positive for weakness. Negative for sensory change, speech change, focal weakness and headaches.  Endo/Heme/Allergies: Does not bruise/bleed easily.  Psychiatric/Behavioral: Negative for depression. The patient is not nervous/anxious.     DRUG ALLERGIES:   Allergies  Allergen Reactions  . Penicillins Rash  . Prednisone Rash    VITALS:  Blood pressure (!) 111/50, pulse 67, temperature 99.6 F (37.6 C), temperature source Oral, resp. rate 18, height 5\' 2"  (1.575 m), weight 78.8 kg (173 lb 11.6 oz), SpO2 91 %.  PHYSICAL EXAMINATION:   Physical Exam  GENERAL:  56 y.o.-year-old patient lying in the bed with no acute distress.  EYES: Pupils equal, round, reactive to light and accommodation. No scleral icterus. Extraocular muscles intact.  HEENT: Head atraumatic, normocephalic. Oropharynx and nasopharynx clear.  NECK:  Supple, no jugular venous distention. No thyroid enlargement, no tenderness.  LUNGS: Mild bilateral wheezing CARDIOVASCULAR: S1, S2 normal. No  murmurs, rubs, or gallops.  ABDOMEN: Soft, nontender, nondistended. Bowel sounds present. No organomegaly or mass.  EXTREMITIES: No cyanosis, clubbing or edema b/l.    NEUROLOGIC: Cranial nerves II through XII are intact. No focal Motor or sensory deficits b/l.   PSYCHIATRIC: The patient is alert and oriented x 3.  SKIN: No obvious rash, lesion, or ulcer.   LABORATORY PANEL:   CBC  Recent Labs Lab 03/16/17 0438  WBC 9.3  HGB 12.9  HCT 41.1  PLT 179   ------------------------------------------------------------------------------------------------------------------ Chemistries   Recent Labs Lab 03/16/17 0438  NA 140  K 5.3*  CL 108  CO2 27  GLUCOSE 219*  BUN 14  CREATININE 0.87  CALCIUM 9.1   ------------------------------------------------------------------------------------------------------------------  Cardiac Enzymes  Recent Labs Lab 03/15/17 1441  TROPONINI <0.03   ------------------------------------------------------------------------------------------------------------------  RADIOLOGY:  Dg Chest 1 View  Result Date: 03/15/2017 CLINICAL DATA:  Shortness of breath today.  History of COPD. EXAM: CHEST 1 VIEW COMPARISON:  PA and lateral chest 07/01/2015. FINDINGS: There is cardiomegaly without edema. Atherosclerosis is noted. No consolidative process, pneumothorax or effusion. IMPRESSION: Cardiomegaly without acute disease. Atherosclerosis. Electronically Signed   By: Drusilla Kannerhomas  Dalessio M.D.   On: 03/15/2017 15:19     ASSESSMENT AND PLAN:   * Acute on chronic respiratory failure with hypoxia due to COPD exacerbation -IV steroids, Antibiotics - Scheduled Nebulizers - Inhalers -Wean O2 as tolerated - Discussed with Dr. Bard HerbertSimmonds of pulmonary  * Hypertension   Continue carvedilol.  * Hyperkalemia. One dose of Kayexalate.  * Bipolar and depression   Continue lithium, risperidone.  * Hypothyroidism   Continue levothyroxine.  * DVT prophylaxis with  Lovenox  All the records are  reviewed and case discussed with Care Management/Social Workerr. Management plans discussed with the patient, family and they are in agreement.  CODE STATUS: FULL CODE  TOTAL TIME TAKING CARE OF THIS PATIENT: 30 minutes.   Likely discharge tomorrow  Milagros Loll R M.D on 03/16/2017 at 12:23 PM  Between 7am to 6pm - Pager - [phone]  After 6pm go to [email or web address] - password [email or web address]  [facility]  Office  [phone]  CC: Primary care physician; System, Pcp Not In  Note: This dictation was prepared with Dragon dictation along with smaller phrase technology. Any transcriptional errors that result from this process are unintentional.

## 2017-03-17 LAB — BASIC METABOLIC PANEL
Anion gap: 4 — ABNORMAL LOW (ref 5–15)
BUN: 26 mg/dL — AB (ref 6–20)
CHLORIDE: 108 mmol/L (ref 101–111)
CO2: 30 mmol/L (ref 22–32)
CREATININE: 1.08 mg/dL — AB (ref 0.44–1.00)
Calcium: 9.4 mg/dL (ref 8.9–10.3)
GFR calc Af Amer: >60 mL/min (ref >60)
GFR calc non Af Amer: 56 mL/min — ABNORMAL LOW (ref >60)
GLUCOSE: 156 mg/dL — AB (ref 65–99)
Potassium: 4.6 mmol/L (ref 3.5–5.1)
Sodium: 142 mmol/L (ref 135–145)

## 2017-03-17 MED ORDER — LEVOFLOXACIN 500 MG PO TABS: 500.0000 mg | ORAL_TABLET | Freq: Every day | ORAL | 0 refills | Status: AC

## 2017-03-17 NOTE — Progress Notes (Signed)
Discharge summary reviewed with Tammy [name]. O2 tank delivered by [facility]. Discharging on 2L via nasal canula. [facility]. Escorted to vehicle via wc.

## 2017-03-17 NOTE — Evaluation (Signed)
Physical Therapy Evaluation Patient Details Name: Tammy [name] MRN: [id number] DOB: 1960-12-12 Today's Date: 03/17/2017   History of Present Illness  Pt is a 56 y.o F admitted to acute care with diagnosis of acute on chronic respiratory failure, on 03/15/17. Per chart reveiw, pt independent with ADL's, prior to admission - note: pt has caregiver assist. Pt ambulating independent with no AD, prior to admission. Pt PMH includes: asthma, bipolar disorder, SHF, COPD, epilepsy, and seizures. Utilizes 2L O2 prn.    Clinical Impression  Pt is a pleasant 56 y.o. F, admitted to acute care for acute on chronic respiratory failure, with notable wheezing. Upon arrival, pt was found in bed, attempting to stand, requesting to "go to the bathroom." Pt is highly impulsive and able to follow one-step commands. Pt performs bed mobility with supervision, and tranfers and ambulation with CGA, secondary to impaired safety awareness and impulsivity; chair follow required to maintain safety during treatment. At rest, pt able to maintain O2sat above 90%, but requires supplemental O2 to maintain O2 sats with transfers and ambulation. O2 sats dropped to 76 on 2 liters supplemental O2 with amb ~200 ft; pt showed no signs of sx/distress, and recovered quickly with sitting and pursed lipped breathing.Pt presents with the following deficits: impaired LE strength and endurance. Overall, pt responded well to today's treatment with no adverse affects. Pt would benefit from skilled PT to address the previously mentioned impairments and promote return to PLOF.      Follow Up Recommendations Home health PT    Equipment Recommendations  None recommended by PT    Recommendations for Other Services       Precautions / Restrictions Precautions Precautions: Fall Precaution Comments: Impulsive Restrictions Weight Bearing Restrictions: No      Mobility  Bed Mobility Overal bed mobility: Needs Assistance Bed Mobility: Supine  to Sit     Supine to sit: Supervision     General bed mobility comments: Difficulty transfering from supine to sit, but able to demonstrate taskk with supervision. Pt states, "I can't sit up by myself," after demonstrating task.   Transfers Overall transfer level: Needs assistance Equipment used: None Transfers: Sit to/from Stand Sit to Stand: Min guard         General transfer comment: Pt impulsive and lacks safetly awareness when coming from sit to stand. Requires contact guard assist, for safety purposes  Ambulation/Gait Ambulation/Gait assistance: Min guard Ambulation Distance (Feet): 200 Feet Assistive device: None Gait Pattern/deviations: Step-through pattern;Decreased step length - right;Decreased step length - left     General Gait Details: Pt required CGA with ambulating 200 ft, secondary to impaired safety awareness, and impulsive behavior. 2L O2 utilized during ambulation; pt encouraged to rest as O2 sats dropped to 76%, recovering to 92% once seated and educated on pursed lipped breathing. Pt exhibited no Sx or sings of distress with ambulation.   Stairs            Wheelchair Mobility    Modified Rankin (Stroke Patients Only)       Balance Overall balance assessment: Modified Independent                                           Pertinent Vitals/Pain Pain Assessment: No/denies pain    Home Living Family/patient expects to be discharged to:: Group home Living Arrangements: Group Home  Additional Comments: Caregiver    Prior Function Level of Independence: Independent   Gait / Transfers Assistance Needed: Utilizes 2L of supplemental O2 with amb.            Hand Dominance        Extremity/Trunk Assessment   Upper Extremity Assessment Upper Extremity Assessment: Overall WFL for tasks assessed    Lower Extremity Assessment Lower Extremity Assessment: Generalized weakness (Grossly 4/5)        Communication   Communication: No difficulties  Cognition Arousal/Alertness: Awake/alert Behavior During Therapy: Impulsive Overall Cognitive Status: History of cognitive impairments - at baseline                                 General Comments: Pt impulsive. Able to follw one-step commands.       General Comments      Exercises Other Exercises Other Exercises: Toileting performed, with pt requiring CGA to ambulate and transfer to raised toilet seat;. supervison required for managing clothing. O2 sats dropped to 80% on room air during toileting activity; pt recovered quickly to 90% with 2 L supplemental O2 and cues to breath through nose. Seated there-ex required supervision; pt performed 10 reps: B marches and LAQ's. Education re: pursed lipped breathing, safety awareness, self- monitoring levels of fatigue and limiting amb distances until O2 recovers.   Assessment/Plan    PT Assessment Patient needs continued PT services  PT Problem List Decreased strength;Decreased activity tolerance;Decreased mobility;Decreased safety awareness       PT Treatment Interventions Functional mobility training;Therapeutic activities;Therapeutic exercise;Patient/family education    PT Goals (Current goals can be found in the Care Plan section)  Acute Rehab PT Goals Patient Stated Goal: "To go to the bathroom." PT Goal Formulation: With patient Time For Goal Achievement: 03/31/17 Potential to Achieve Goals: Good Additional Goals Additional Goal #1: Pt will ambulate 50 ft without O2 sats dropping below 90%, while on 2L supplemental O2, for safe return to IADL's such as school.    Frequency Min 2X/week   Barriers to discharge        Co-evaluation               AM-PAC PT "6 Clicks" Daily Activity  Outcome Measure Difficulty turning over in bed (including adjusting bedclothes, sheets and blankets)?: A Little Difficulty moving from lying on back to sitting on the side of  the bed? : A Lot Difficulty sitting down on and standing up from a chair with arms (e.g., wheelchair, bedside commode, etc,.)?: Total Help needed moving to and from a bed to chair (including a wheelchair)?: None Help needed walking in hospital room?: A Little Help needed climbing 3-5 steps with a railing? : A Little 6 Click Score: 16    End of Session Equipment Utilized During Treatment: Gait belt;Oxygen Activity Tolerance: Patient limited by fatigue Patient left: in chair;with call bell/phone within reach;with chair alarm set (Education: call for assist; stay in chair until nurse arrive)   PT Visit Diagnosis: Muscle weakness (generalized) (M62.81);Unsteadiness on feet (R26.81)    Time: 2536-6440 PT Time Calculation (min) (ACUTE ONLY): 20 min   Charges:   PT Evaluation $PT Eval Low Complexity: 1 Procedure PT Treatments $Therapeutic Activity: 8-22 mins   PT G Codes:        Sharman Cheek PT, SPT  Tammy [name] 03/17/2017, 12:08 PM

## 2017-03-17 NOTE — Progress Notes (Signed)
MD called with new order for O2 for transport due to sat levels

## 2017-03-17 NOTE — Care Management (Signed)
[facility] has received orders for home O2 and will send driver to this room to deliver O2. No other RNCM needs.

## 2017-03-17 NOTE — Progress Notes (Signed)
Patient is medically stable for [facility] [place]. Per [facility] group home administrator patient has oxygen at night through [facility]. RN case manager arranged 24/7 oxygen through [facility]. [facility] delivered portable oxygen tank to patient's room today. Clinical Child psychotherapistocial Worker (CSW) prepared D/C packet. RN will call group home owner [facility] when patient is ready for transport. Group home will transport. RN case manager aware of above. Patient is aware of above. CSW contacted patient's sister Sallye OberLouise and made her aware of above. Please reconsult if future social work needs arise. CSW signing off.   [employer] Hughes IncorporatedBailey Gilberta Peeters, LCSW [phone]

## 2017-03-17 NOTE — Care Management (Signed)
[facility] No further RNCM needs. CSW will complete discharge with nurse/patient/facility.

## 2017-03-17 NOTE — Care Management (Addendum)
Notified by CSW that patient may need chronic O2 (only [facility] it at [facility] RN to assess patient need for chronic O2. If qualifies patient will receive portable O2 tank through [facility] prior to discharge. Per RN patient had significant drop in O2 sats with ambulation. [facility] notified of need for chronic O2 and portable tank delivery prior to patient discharge today. Paged Dr. Juliene PinaMody for O2 order and to discuss discharge with low O2 sats; MD will come see patient.

## 2017-03-17 NOTE — NC FL2 (Addendum)
[place] MEDICAID FL2 LEVEL OF CARE SCREENING TOOL     IDENTIFICATION  Patient Name: Tammy [name] Birthdate: [birth year] Sex: female Admission Date (Current Location): 03/15/2017  [place] and [place] Number:  Chiropodist and Address:  [facility], [address] [place], [place] [address]      Provider Number: [id number]  Attending Physician Name and Address:  Adrian Saran, MD  Relative Name and Phone Number:       Current Level of Care: Other (Comment) (Group Home) Recommended Level of Care: [facility] Prior Approval Number:    Date Approved/Denied:   PASRR Number: [id number]  Discharge Plan: Other (Comment) (Group Home)    Current Diagnoses: Patient Active Problem List   Diagnosis Date Noted  . Acute on chronic respiratory failure with hypoxia (HCC) 03/15/2017  . COPD with acute exacerbation (HCC) 03/15/2017  . Acute on chronic respiratory failure (HCC) 03/15/2017    Orientation RESPIRATION BLADDER Height & Weight     Self, Time, Situation, Place  Oxygen 2 Liters  Continent Weight: 173 lb 11.6 oz (78.8 kg) Height:  5\' 2"  (157.5 cm)  BEHAVIORAL SYMPTOMS/MOOD NEUROLOGICAL BOWEL NUTRITION STATUS      Continent  Heart Healthy Diet   AMBULATORY STATUS COMMUNICATION OF NEEDS Skin   Supervision  Verbally Normal                       Personal Care Assistance Level of Assistance  Bathing, Feeding, Dressing Bathing Assistance: Independent Feeding assistance: Independent Dressing Assistance: Independent     Functional Limitations Info    Sight Info: Adequate        SPECIAL CARE FACTORS FREQUENCY   PT   2-3 days per week via home health                   Contractures Contractures Info: Present    Additional Factors Info  Allergies   Allergies Info: Penicillins, Prednisone          Discharge Medications: Please see discharge summary for a list of discharge medications. Current Discharge  Medication List       START taking these medications   Details  levofloxacin (LEVAQUIN) 500 MG tablet Take 1 tablet (500 mg total) by mouth daily. Qty: 3 tablet, Refills: 0         CONTINUE these medications which have NOT CHANGED   Details  albuterol (PROVENTIL HFA;VENTOLIN HFA) 108 (90 BASE) MCG/ACT inhaler Inhale 4-6 puffs by mouth every 4 hours as needed for wheezing, cough, and/or shortness of breath Qty: 1 Inhaler, Refills: 1    benzonatate (TESSALON) 100 MG capsule Take 100 mg by mouth 3 (three) times daily as needed for cough.    benztropine (COGENTIN) 1 MG tablet Take 1 tablet (1 mg total) by mouth 2 (two) times daily. Qty: 60 tablet, Refills: 2    budesonide-formoterol (SYMBICORT) 80-4.5 MCG/ACT inhaler Inhale 2 puffs into the lungs 2 (two) times daily.    busPIRone (BUSPAR) 15 MG tablet Take 15 mg by mouth 2 (two) times daily.     carvedilol (COREG) 3.125 MG tablet Take 3.125 mg by mouth 2 (two) times daily with a meal.    cetirizine (ZYRTEC) 10 MG tablet Take 10 mg by mouth daily.    Cholecalciferol (VITAMIN D3) 5000 units CAPS Take 1 capsule by mouth daily.    fluticasone (FLONASE) 50 MCG/ACT nasal spray Place 1 spray into both nostrils daily.  Levothyroxine Sodium 100 MCG CAPS Take 100 mcg by mouth daily before breakfast.     lithium carbonate 300 MG capsule Take 300 mg by mouth 2 (two) times daily with a meal.    Omega-3 Fatty Acids (FISH OIL) 1000 MG CAPS Take 1 capsule by mouth 2 (two) times daily.    risperiDONE (RISPERDAL) 1 MG tablet Take 1 mg by mouth 2 (two) times daily.       Relevant Imaging Results: Relevant Lab Results: Additional Information SS# [id number]  Jakhai Fant, Darleen CrockerBailey M, [place]

## 2017-03-17 NOTE — Discharge Summary (Addendum)
[facility] - [place] at [facility]   PATIENT NAME: Tammy [name]    MR#:  [id number]  DATE OF BIRTH:  [birth year]  DATE OF ADMISSION:  03/15/2017 ADMITTING PHYSICIAN: Altamese DillingVaibhavkumar Vachhani, MD  DATE OF DISCHARGE: 03/17/2017  PRIMARY CARE PHYSICIAN: System, Pcp Not In    ADMISSION DIAGNOSIS:  Hypoxia [R09.02] COPD exacerbation (HCC) [J44.1]  DISCHARGE DIAGNOSIS:  Principal Problem:   Acute on chronic respiratory failure with hypoxia (HCC) Active Problems:   COPD with acute exacerbation (HCC)   Acute on chronic respiratory failure (HCC)   SECONDARY DIAGNOSIS:   Past Medical History:  Diagnosis Date  . Asthma   . Bipolar 1 disorder (HCC)   . CHF (congestive heart failure) (HCC)   . COPD (chronic obstructive pulmonary disease) (HCC)   . Dementia   . Epilepsy (HCC)   . Seizures [facility](HCC)     HOSPITAL COURSE:   56 year old female with a history of COPD and diastolic heart failure who presents with shortness of breath. 1. Acute on chronic respiratory failure with hypoxia due to acute exacerbation and COPD Patient was initiated on BiPAP and now weaned to baseline oxygen of 2 L nasal cannula. On examination at discharge her lungs are clear to auscultation. She will continue with Levaquin for treatment for bronchitis associated with COPD exacerbation. She no longer needs steroids. She was evaluated during this hospital stay by pulmonology.  2. Essential hypertension: Continue Coreg  3. Hyperkalemia: Patient's potassium has improved with Kayexalate  4. Bipolar and depression: Patient will continue lithium, Risperdal and benztropine  5. Hypothyroid: Continue Synthroid   6. Chronic CHF without exacerbation: DISCHARGE CONDITIONS AND DIET:   Stable for discharge and regular diet  CONSULTS OBTAINED:    DRUG ALLERGIES:   Allergies  Allergen Reactions  . Penicillins Rash  . Prednisone Rash    DISCHARGE MEDICATIONS:   Current Discharge Medication List     START taking these medications   Details  levofloxacin (LEVAQUIN) 500 MG tablet Take 1 tablet (500 mg total) by mouth daily. Qty: 3 tablet, Refills: 0      CONTINUE these medications which have NOT CHANGED   Details  albuterol (PROVENTIL HFA;VENTOLIN HFA) 108 (90 BASE) MCG/ACT inhaler Inhale 4-6 puffs by mouth every 4 hours as needed for wheezing, cough, and/or shortness of breath Qty: 1 Inhaler, Refills: 1    benzonatate (TESSALON) 100 MG capsule Take 100 mg by mouth 3 (three) times daily as needed for cough.    benztropine (COGENTIN) 1 MG tablet Take 1 tablet (1 mg total) by mouth 2 (two) times daily. Qty: 60 tablet, Refills: 2    budesonide-formoterol (SYMBICORT) 80-4.5 MCG/ACT inhaler Inhale 2 puffs into the lungs 2 (two) times daily.    busPIRone (BUSPAR) 5 MG tablet Take 5 mg by mouth 2 (two) times daily.     carvedilol (COREG) 3.125 MG tablet Take 3.125 mg by mouth 2 (two) times daily with a meal.    cetirizine (ZYRTEC) 10 MG tablet Take 10 mg by mouth daily.    Cholecalciferol (VITAMIN D3) 5000 units CAPS Take 1 capsule by mouth daily.    fluticasone (FLONASE) 50 MCG/ACT nasal spray Place 1 spray into both nostrils daily.     Levothyroxine Sodium 100 MCG CAPS Take 100 mcg by mouth daily before breakfast.     lithium carbonate 300 MG capsule Take 300 mg by mouth 2 (two) times daily with a meal.    Omega-3 Fatty Acids (FISH OIL) 1000 MG CAPS Take 1  capsule by mouth 2 (two) times daily.    risperiDONE (RISPERDAL) 1 MG tablet Take 1 mg by mouth 2 (two) times daily.           Today   CHIEF COMPLAINT:  No acute issues overnight. Patient is not reporting shortness of breath and wheezing   VITAL SIGNS:  Blood pressure 129/70, pulse 84, temperature 97.9 F (36.6 C), temperature source Oral, resp. rate 18, height 5\' 2"  (1.575 m), weight 78.8 kg (173 lb 11.6 oz), SpO2 96 %.   REVIEW OF SYSTEMS:  Review of Systems  Constitutional: Negative.  Negative for  chills, fever and malaise/fatigue.  HENT: Negative.  Negative for ear discharge, ear pain, hearing loss, nosebleeds and sore throat.   Eyes: Negative.  Negative for blurred vision and pain.  Respiratory: Negative.  Negative for cough, hemoptysis, shortness of breath and wheezing.   Cardiovascular: Negative.  Negative for chest pain, palpitations and leg swelling.  Gastrointestinal: Negative.  Negative for abdominal pain, blood in stool, diarrhea, nausea and vomiting.  Genitourinary: Negative.  Negative for dysuria.  Musculoskeletal: Negative.  Negative for back pain.  Skin: Negative.   Neurological: Negative for dizziness, tremors, speech change, focal weakness, seizures and headaches.  Endo/Heme/Allergies: Negative.  Does not bruise/bleed easily.  Psychiatric/Behavioral: Positive for depression. Negative for hallucinations and suicidal ideas.     PHYSICAL EXAMINATION:  GENERAL:  56 y.o.-year-old patient lying in the bed with no acute distress.  NECK:  Supple, no jugular venous distention. No thyroid enlargement, no tenderness.  LUNGS: Normal breath sounds bilaterally, no wheezing, rales,rhonchi  No use of accessory muscles of respiration.  CARDIOVASCULAR: S1, S2 normal. No murmurs, rubs, or gallops.  ABDOMEN: Soft, non-tender, non-distended. Bowel sounds present. No organomegaly or mass.  EXTREMITIES: No pedal edema, cyanosis, or clubbing.  PSYCHIATRIC: The patient is alert and oriented x 3.  SKIN: No obvious rash, lesion, or ulcer.   DATA REVIEW:   CBC  Recent Labs Lab 03/16/17 0438  WBC 9.3  HGB 12.9  HCT 41.1  PLT 179    Chemistries   Recent Labs Lab 03/17/17 0316  NA 142  K 4.6  CL 108  CO2 30  GLUCOSE 156*  BUN 26*  CREATININE 1.08*  CALCIUM 9.4    Cardiac Enzymes  Recent Labs Lab 03/15/17 1441  TROPONINI <0.03    Microbiology Results  @MICRORSLT48 @  RADIOLOGY:  Dg Chest 1 View  Result Date: 03/15/2017 CLINICAL DATA:  Shortness of breath today.   History of COPD. EXAM: CHEST 1 VIEW COMPARISON:  PA and lateral chest 07/01/2015. FINDINGS: There is cardiomegaly without edema. Atherosclerosis is noted. No consolidative process, pneumothorax or effusion. IMPRESSION: Cardiomegaly without acute disease. Atherosclerosis. Electronically Signed   By: Drusilla Kanner M.D.   On: 03/15/2017 15:19      Current Discharge Medication List    START taking these medications   Details  levofloxacin (LEVAQUIN) 500 MG tablet Take 1 tablet (500 mg total) by mouth daily. Qty: 3 tablet, Refills: 0      CONTINUE these medications which have NOT CHANGED   Details  albuterol (PROVENTIL HFA;VENTOLIN HFA) 108 (90 BASE) MCG/ACT inhaler Inhale 4-6 puffs by mouth every 4 hours as needed for wheezing, cough, and/or shortness of breath Qty: 1 Inhaler, Refills: 1    benzonatate (TESSALON) 100 MG capsule Take 100 mg by mouth 3 (three) times daily as needed for cough.    benztropine (COGENTIN) 1 MG tablet Take 1 tablet (1 mg total) by mouth 2 (  two) times daily. Qty: 60 tablet, Refills: 2    budesonide-formoterol (SYMBICORT) 80-4.5 MCG/ACT inhaler Inhale 2 puffs into the lungs 2 (two) times daily.    busPIRone (BUSPAR) 15 MG tablet Take 15 mg by mouth 2 (two) times daily.     carvedilol (COREG) 3.125 MG tablet Take 3.125 mg by mouth 2 (two) times daily with a meal.    cetirizine (ZYRTEC) 10 MG tablet Take 10 mg by mouth daily.    Cholecalciferol (VITAMIN D3) 5000 units CAPS Take 1 capsule by mouth daily.    fluticasone (FLONASE) 50 MCG/ACT nasal spray Place 1 spray into both nostrils daily.     Levothyroxine Sodium 100 MCG CAPS Take 100 mcg by mouth daily before breakfast.     lithium carbonate 300 MG capsule Take 300 mg by mouth 2 (two) times daily with a meal.    Omega-3 Fatty Acids (FISH OIL) 1000 MG CAPS Take 1 capsule by mouth 2 (two) times daily.    risperiDONE (RISPERDAL) 1 MG tablet Take 1 mg by mouth 2 (two) times daily.            Management plans discussed with the patient and he is in agreement. Stable for discharge   Patient should follow up with pcp  CODE STATUS:     Code Status Orders        Start     Ordered   03/15/17 1746  Full code  Continuous     03/15/17 1745    Code Status History    Date Active Date Inactive Code Status Order ID Comments User Context   This patient has a current code status but no historical code status.      TOTAL TIME TAKING CARE OF THIS PATIENT: 37 minutes.    Note: This dictation was prepared with Dragon dictation along with smaller phrase technology. Any transcriptional errors that result from this process are unintentional.  Rayla Pember M.D on 03/17/2017 at 9:34 AM  Between 7am to 6pm - Pager - [phone] After 6pm go to [email or web address] - password [email or web address]  [facility]  Office  [phone]  CC: Primary care physician; System, Pcp Not In

## 2017-03-17 NOTE — Care Management Note (Signed)
Case Management Note  Patient Details  Name: Tammy [name] MRN: [id number] Date of Birth: 1961/01/08  Subjective/Objective:                   RNCM spoke with facility owner Tammy [name] [phone] by phone for discharge planning. [facility]. She states that patient is independent with PRN O2. and nebulizer. Per Tammy [name], patient's sisters are guardian- Tammy [name] and Tammy [name]. [facility] and I assume this is [facility] but Tammy [name] could not recall name. She states "she see Dr. [name]" and I feel this is the NP at [facility] which is closed at the time of this note. Regardless patient has an active PCP. Will need updated FL2 at discharge [name]. They can provide transportation and a rolling walker if needed. If home health is needed they prefer to use [facility]. Action/Plan: [name] with [facility] updated. PT evaluation pending.   Expected Discharge Date:                  Expected Discharge Plan:     In-House Referral:  Clinical Social Work  Discharge planning Services  CM Consult  Post Acute Care Choice:    Choice offered to:  [facility] Guardian  DME Arranged:    DME Agency:     HH Arranged:  PT HH Agency:  [facility]  Status of Service:  In process, will continue to follow  If discussed at Long Length of Stay Meetings, dates discussed:    Additional Comments:  [name], RN 03/17/2017, 7:55 AM

## 2017-03-17 NOTE — Progress Notes (Signed)
SATURATION QUALIFICATIONS: (This note is used to comply with regulatory documentation for home oxygen)  Patient Saturations on Room Air at Rest = 82%  Patient Saturations on Room Air while Ambulating = 72%  Patient Saturations on 2 Liters of oxygen while Ambulating = 92%  Please briefly explain why patient needs home oxygen: patient being discharged today that currently has O2 at night, O2 dropping during ambulation.

## 2017-03-17 NOTE — Progress Notes (Signed)
Patient refusing scds. Education given. Medicated x 1 for leg pain

## 2017-03-17 NOTE — Progress Notes (Signed)
Paged MD with need for O2 before discharge. Awaiting response.

## 2017-03-19 NOTE — Progress Notes (Signed)
Pt returned to this unit on 06/12 at approx 1050 am via wheelchair and accompanied by the director of the group home, names Maurine MinisterDennis. Per dennis, the patient was put to bed on return to the group home the evening before, and staff showered pt this morning, finding that pt still retained an Iv site in her L lower shin. Patient did indeed have a piv #20 intact to L lower shin loosely wrapped in damp kling. This Clinical research associatewriter removed the piv with catheter intact. Pt initially said that the site hurt, but pain stopped when catheter removed. Site free of redness and swelling. Pt then left the unit to return to the group home.

## 2017-03-24 NOTE — Progress Notes (Signed)
03/25/2017 10:47 AM   Tammy [name] 07/15/1961 [id number]  Referring provider: Armando GangLindley, Cheryl P, FNP [address] [place], [place] [id number]  Chief Complaint  Patient presents with  . New Patient (Initial Visit)    urinary incontinence referred by Franco Nonesheryl Lindley    HPI: Patient is a 56 year old Caucasian female who is referred to us by, [name], FNP, for difficulty urinating and pain with her caregiver, Maurine MinisterDennis.    Patient states that she has had pain and difficulty with urination for one month.   She did have back pain at that time.  She is having associated hesitancy and straining to urinate.   She does have a history of urinary tract infections.  Her caregiver states that she has had 3 or 4 UTI's over the last two years.  She does not have a history of STI's or injury to the bladder.   She endorse suprapubic pain.  She denies dysuria, gross hematuria, back pain, abdominal pain or flank pain.   She has not had any recent fevers, chills, nausea or vomiting.  She does not have a history of nephrolithiasis, GU surgery or GU trauma.   She is not sexually active.  She is post menopausal.   She denies constipation and/or diarrhea.   She is having with bladder filling.    CT scan performed at [facility] in 02/2017 noted bladder distention.  I do not have access to these films.    Her PVR today is 449 mL.  Her serum creatinine has been elevated intermittently over the last 2 weeks. It is been as high as 1.27 and also has been in the normal range of 0.87.    She denies any recent back injuries, DM, neurological conditions, new medications or numbness in the saddle area.    PMH: Past Medical History:  Diagnosis Date  . Anxiety   . Asthma   . Bipolar 1 disorder (HCC)   . CHF (congestive heart failure) (HCC)   . COPD (chronic obstructive pulmonary disease) (HCC)   . Dementia   . Epilepsy (HCC)   . Seizures [facility](HCC)     Surgical History: Past Surgical  History:  Procedure Laterality Date  . APPENDECTOMY    . hernia reapir     x 3      Home Medications:  Allergies as of 03/25/2017      Reactions   Penicillins Rash   Prednisone Rash      Medication List       Accurate as of 03/25/17 10:47 AM. Always use your most recent med list.          albuterol 108 (90 Base) MCG/ACT inhaler Commonly known as:  PROVENTIL HFA;VENTOLIN HFA Inhale 4-6 puffs by mouth every 4 hours as needed for wheezing, cough, and/or shortness of breath   alendronate 70 MG/75ML solution Commonly known as:  FOSAMAX Take 70 mg by mouth every 7 (seven) days. Take with a full glass of water on an empty stomach.   benzonatate 100 MG capsule Commonly known as:  TESSALON Take 100 mg by mouth 3 (three) times daily as needed for cough.   benztropine 1 MG tablet Commonly known as:  COGENTIN Take 1 tablet (1 mg total) by mouth 2 (two) times daily.   budesonide-formoterol 80-4.5 MCG/ACT inhaler Commonly known as:  SYMBICORT Inhale 2 puffs into the lungs 2 (two) times daily.   busPIRone 15 MG tablet Commonly known as:  BUSPAR Take 5 mg by mouth  2 (two) times daily.   calcium carbonate 1500 (600 Ca) MG Tabs tablet Commonly known as:  OSCAL Take 600 mg of elemental calcium by mouth 2 (two) times daily with a meal.   carvedilol 3.125 MG tablet Commonly known as:  COREG Take 3.125 mg by mouth 2 (two) times daily with a meal.   cetirizine 10 MG tablet Commonly known as:  ZYRTEC Take 10 mg by mouth daily.   Fish Oil 1000 MG Caps Take 1 capsule by mouth 2 (two) times daily.   fluticasone 50 MCG/ACT nasal spray Commonly known as:  FLONASE Place 1 spray into both nostrils daily.   Levothyroxine Sodium 100 MCG Caps Take 100 mcg by mouth daily before breakfast.   lithium carbonate 300 MG capsule Take 300 mg by mouth 2 (two) times daily with a meal.   nystatin powder Generic drug:  nystatin Apply topically 2 (two) times daily.   risperiDONE 1 MG  tablet Commonly known as:  RISPERDAL Take 1 mg by mouth 2 (two) times daily.   Vitamin D3 5000 units Caps Take 1 capsule by mouth daily.       Allergies:  Allergies  Allergen Reactions  . Penicillins Rash  . Prednisone Rash    Family History: Family History  Problem Relation Age of Onset  . Hypertension Mother   . Kidney cancer Neg Hx   . Bladder Cancer Neg Hx     Social History:  reports that she quit smoking about 18 years ago. She has never used smokeless tobacco. She reports that she does not drink alcohol or use drugs.  ROS: UROLOGY Frequent Urination?: No Hard to postpone urination?: No Burning/pain with urination?: No Get up at night to urinate?: No Leakage of urine?: No Urine stream starts and stops?: No Trouble starting stream?: Yes Do you have to strain to urinate?: Yes Blood in urine?: No Urinary tract infection?: No Sexually transmitted disease?: No Injury to kidneys or bladder?: No Painful intercourse?: No Weak stream?: No Currently pregnant?: No Vaginal bleeding?: No Last menstrual period?: n  Gastrointestinal Nausea?: No Vomiting?: No Indigestion/heartburn?: No Diarrhea?: No Constipation?: No  Constitutional Fever: No Night sweats?: No Weight loss?: No Fatigue?: No  Skin Skin rash/lesions?: No Itching?: No  Eyes Blurred vision?: No Double vision?: No  Ears/Nose/Throat Sore throat?: No Sinus problems?: No  Hematologic/Lymphatic Swollen glands?: No Easy bruising?: No  Cardiovascular Leg swelling?: No Chest pain?: No  Respiratory Cough?: No Shortness of breath?: Yes  Endocrine Excessive thirst?: Yes  Musculoskeletal Back pain?: No Joint pain?: No  Neurological Headaches?: No Dizziness?: No  Psychologic Depression?: No Anxiety?: Yes  Physical Exam: BP 115/67   Pulse 75   Ht 5\' 2"  (1.575 m)   Wt 173 lb 6.4 oz (78.7 kg)   BMI 31.72 kg/m   Constitutional: Well nourished. Alert and oriented, No acute  distress. HEENT: [place] AT, moist mucus membranes. Trachea midline, no masses. Cardiovascular: No clubbing, cyanosis, or edema. Respiratory: Normal respiratory effort, no increased work of breathing. GI: Abdomen is soft, non tender, non distended, no abdominal masses. Liver and spleen not palpable.  No hernias appreciated.  Stool sample for occult testing is not indicated.   GU: No CVA tenderness.  No bladder fullness or masses.  Atrophic external genitalia, normal pubic hair distribution, no lesions.  Normal urethral meatus, no lesions, no prolapse, no discharge.   Urethral caruncle noted.  Bladder is tender.  Pale vagina mucosa, poor estrogen effect, no discharge, no lesions, good pelvic support,  no cystocele or rectocele noted.  No cervical motion tenderness.  Uterus is freely mobile and non-fixed.  No adnexal/parametria masses or tenderness noted.  Anus and perineum are without rashes or lesions.    Skin: No rashes, bruises or suspicious lesions. Lymph: No cervical or inguinal adenopathy. Neurologic: Grossly intact, no focal deficits, moving all 4 extremities. Psychiatric: Normal mood and affect.  Laboratory Data: Lab Results  Component Value Date   WBC 9.3 03/16/2017   HGB 12.9 03/16/2017   HCT 41.1 03/16/2017   MCV 87.3 03/16/2017   PLT 179 03/16/2017    Lab Results  Component Value Date   CREATININE 1.08 (H) 03/17/2017    Lab Results  Component Value Date   AST 22 07/03/2015   Lab Results  Component Value Date   ALT 15 07/03/2015     Pertinent Imaging: Results for LANAYAH, GARTLEY (MRN [id number]) as of 03/25/2017 10:37  Ref. Range 03/25/2017 10:05  Scan Result Unknown 449    Assessment & Plan:    1. Urinary retention  - etiology is unclear at this time - obtain KUB to rule out constipation   - Foley placed today with return of 480 mL  - Foley to remain in place for one week - RTC in one week for TOV - if TOV successful, RTC in one month for PVR and OAB  questionnaire  - Start Flomax 0.4 mg daily - she is advised to take it 30 minutes after a meal.  I advised her and the caregiver of the side effects, such as: sinus congestion, nasal congestion, rhinorrhea, rhinitis, dizziness, and seasonal allergic rhinitis.  - will recommend timed voids once Foley is removed in addition to the Flomax - attempting to void q 2 hours while awake  Return in about 1 week (around 04/01/2017) for TOV.  These notes generated with voice recognition software. I apologize for typographical errors.  Michiel Cowboy, PA-C  [facility] [address] [place], [place] [address] [phone]

## 2017-03-25 ENCOUNTER — Ambulatory Visit (INDEPENDENT_AMBULATORY_CARE_PROVIDER_SITE_OTHER): Payer: Medicare Other | Admitting: Urology

## 2017-03-25 ENCOUNTER — Ambulatory Visit
Admission: RE | Admit: 2017-03-25 | Discharge: 2017-03-25 | Disposition: A | Discharge status: Home / Self Care | Payer: Medicare Other | Source: Ambulatory Visit | Attending: Urology | Admitting: Urology

## 2017-03-25 ENCOUNTER — Encounter: Payer: Self-pay | Admitting: Urology

## 2017-03-25 VITALS — BP 115/67 | HR 75 | Ht 62.0 in | Wt 173.4 lb

## 2017-03-25 DIAGNOSIS — R339 Retention of urine, unspecified: Secondary | ICD-10-CM

## 2017-03-25 DIAGNOSIS — R32 Unspecified urinary incontinence: Secondary | ICD-10-CM

## 2017-03-25 LAB — BLADDER SCAN AMB NON-IMAGING: Scan Result: 449

## 2017-03-25 MED ORDER — TAMSULOSIN HCL 0.4 MG PO CAPS: 0.4000 mg | ORAL_CAPSULE | Freq: Every day | ORAL | 3 refills | Status: DC

## 2017-03-25 NOTE — Progress Notes (Signed)
Simple Catheter Placement  Due to urinary retention patient is present today for a foley cath placement.  Patient was cleaned and prepped in a sterile fashion with betadine and lidocaine jelly 2% was instilled into the urethra.  A 16 FR foley catheter was inserted, urine return was noted  480ml, urine was yellow in color.  The balloon was filled with 10cc of sterile water.  A leg bag was attached for drainage. Patient was also given a night bag to take home and was given instruction on how to change from one bag to another.  Patient was given instruction on proper catheter care.  Patient tolerated well, no complications were noted.  Preformed by: Ashley Marineramona William CMA  Additional notes/ Follow up: 7 days

## 2017-03-30 NOTE — Progress Notes (Signed)
[facility] [place] [facility]     Assessment and Plan:  COPD. -Continue Symbicort 2 puffs twice daily, discussed with administrator from group home who administers the patient's medicine, I advised that the patient continue to use this with a spacer to make sure she is getting the medication.  Acute on Chronic hypoxic respiratory failure. -The patient was ambulated around the clinic today, she continues to have desaturations with mild exertion. Therefore, recommend continued use of oxygen at the current level.  Atelectasis. -Continue activity.    Date: 03/30/2017  MRN# [id number] Tammy [name] 1960/12/10   Tammy [name] is a 56 y.o. old female seen in follow up for chief complaint of  Chief Complaint  Patient presents with  . Hospitalization Follow-up    Saw DS in hospital  . COPD    Staff member with home care states patient has been doing well. She is o 2L continuous     HPI:  56 y.o. female former smoker with COPD, CHF, epilepsy admitted 06/09 with COPD exacerbation requiring transient BiPAP. She was discharged on 6/11 on 2L . Since that time she was seen by urology for urinary retention and started on flomax.   She is here with administrator from her group home. He notes that at baseline she gets winded with moderate to mild activity. She is now back to where she was before she got sick.  She continues to use symbicort twice daily and it is noted she has trouble getting the medicine in, she does have a spacer but does not use it regularly. . She does nebs at home as well.   Her sat at baseline at rest on RA is 90% with HR of 88. After walking 180 feet sat dropped to 84% on RA with HR of 91.   I personally reviewed films from 03/15/17 which showed hyperinflation c/w COPD and bibasilar atelectasis.    Medication:    Current Outpatient Prescriptions:  .  albuterol (PROVENTIL HFA;VENTOLIN HFA) 108 (90 BASE) MCG/ACT inhaler, Inhale 4-6 puffs by mouth every 4  hours as needed for wheezing, cough, and/or shortness of breath, Disp: 1 Inhaler, Rfl: 1 .  alendronate (FOSAMAX) 70 MG/75ML solution, Take 70 mg by mouth every 7 (seven) days. Take with a full glass of water on an empty stomach., Disp: , Rfl:  .  benzonatate (TESSALON) 100 MG capsule, Take 100 mg by mouth 3 (three) times daily as needed for cough., Disp: , Rfl:  .  benztropine (COGENTIN) 1 MG tablet, Take 1 tablet (1 mg total) by mouth 2 (two) times daily., Disp: 60 tablet, Rfl: 2 .  budesonide-formoterol (SYMBICORT) 80-4.5 MCG/ACT inhaler, Inhale 2 puffs into the lungs 2 (two) times daily., Disp: , Rfl:  .  busPIRone (BUSPAR) 15 MG tablet, Take 5 mg by mouth 2 (two) times daily. , Disp: , Rfl:  .  calcium carbonate (OSCAL) 1500 (600 Ca) MG TABS tablet, Take 600 mg of elemental calcium by mouth 2 (two) times daily with a meal., Disp: , Rfl:  .  carvedilol (COREG) 3.125 MG tablet, Take 3.125 mg by mouth 2 (two) times daily with a meal., Disp: , Rfl:  .  cetirizine (ZYRTEC) 10 MG tablet, Take 10 mg by mouth daily., Disp: , Rfl:  .  Cholecalciferol (VITAMIN D3) 5000 units CAPS, Take 1 capsule by mouth daily., Disp: , Rfl:  .  fluticasone (FLONASE) 50 MCG/ACT nasal spray, Place 1 spray into both nostrils daily. , Disp: , Rfl:  .  Levothyroxine Sodium 100 MCG CAPS, Take 100 mcg by mouth daily before breakfast. , Disp: , Rfl:  .  lithium carbonate 300 MG capsule, Take 300 mg by mouth 2 (two) times daily with a meal., Disp: , Rfl:  .  nystatin (NYSTATIN) powder, Apply topically 2 (two) times daily., Disp: , Rfl:  .  Omega-3 Fatty Acids (FISH OIL) 1000 MG CAPS, Take 1 capsule by mouth 2 (two) times daily., Disp: , Rfl:  .  risperiDONE (RISPERDAL) 1 MG tablet, Take 1 mg by mouth 2 (two) times daily. , Disp: , Rfl:  .  tamsulosin (FLOMAX) 0.4 MG CAPS capsule, Take 1 capsule (0.4 mg total) by mouth daily., Disp: 90 capsule, Rfl: 3   Allergies:  Penicillins and Prednisone  Review of Systems: Gen:  Denies   fever, sweats. HEENT: Denies blurred vision. Cvc:  No dizziness, chest pain or heaviness Resp:   Denies cough or sputum porduction. Gi: Denies swallowing difficulty, stomach pain. constipation, bowel incontinence Gu:  Denies bladder incontinence, burning urine Ext:   No Joint pain, stiffness. Skin: No skin rash, easy bruising. Endoc:  No polyuria, polydipsia. Psych: No depression, insomnia. Other:  All other systems were reviewed and found to be negative other than what is mentioned in the HPI.   Physical Examination:   VS: BP 100/60 (BP Location: Left Arm, Cuff Size: Normal)   Pulse 76   Resp 16   Ht 5\' 2"  (1.575 m)   Wt 170 lb (77.1 kg)   SpO2 98%   BMI 31.09 kg/m    General Appearance: No distress  Neuro:without focal findings,  speech normal,  HEENT: PERRLA, EOM intact. Pulmonary: normal breath sounds, No wheezing.   CardiovascularNormal S1,S2.  No m/r/g.   Abdomen: Benign, Soft, non-tender. Renal:  No costovertebral tenderness  GU:  Not performed at this time. Endoc: No evident thyromegaly, no signs of acromegaly. Skin:   warm, no rash. Extremities: normal, no cyanosis, clubbing.   LABORATORY PANEL:   CBC No results for input(s): WBC, HGB, HCT, PLT in the last 168 hours. ------------------------------------------------------------------------------------------------------------------  Chemistries  No results for input(s): NA, K, CL, CO2, GLUCOSE, BUN, CREATININE, CALCIUM, MG, AST, ALT, ALKPHOS, BILITOT in the last 168 hours.  Invalid input(s): GFRCGP ------------------------------------------------------------------------------------------------------------------  Cardiac Enzymes No results for input(s): TROPONINI in the last 168 hours. ------------------------------------------------------------  RADIOLOGY:    Results for orders placed during the hospital encounter of 07/01/15  DG Chest 2 View   Narrative CLINICAL DATA:  Chest pain and cough for 1  week  EXAM: CHEST - 2 VIEW  COMPARISON:  01/29/2015  FINDINGS: Cardiac shadow is stable. Aortic calcifications are again noted. The lungs are well aerated bilaterally but hyperinflated. No focal infiltrate or sizable effusion is seen. No bony abnormality is noted.  IMPRESSION: COPD without acute abnormality.   Electronically Signed   By: Alcide CleverMark  Lukens M.D.   On: 07/01/2015 19:19    ------------------------------------------------------------------------------------------------------------------  Thank  you for allowing [facility] to assist in the care of your patient. Our recommendations are noted above.  Please contact us if we can be of further service.   Wells Guileseep Michelangelo Rindfleisch, MD.  [facility] Office Number: [phone]  Santiago Gladavid Kasa, M.D.  Billy Fischeravid Simonds, M.D  03/30/2017

## 2017-03-31 ENCOUNTER — Ambulatory Visit (INDEPENDENT_AMBULATORY_CARE_PROVIDER_SITE_OTHER): Payer: Medicare Other | Admitting: Internal Medicine

## 2017-03-31 ENCOUNTER — Encounter: Payer: Self-pay | Admitting: Internal Medicine

## 2017-03-31 VITALS — BP 100/60 | HR 76 | Resp 16 | Ht 62.0 in | Wt 170.0 lb

## 2017-03-31 DIAGNOSIS — J449 Chronic obstructive pulmonary disease, unspecified: Secondary | ICD-10-CM | POA: Diagnosis not present

## 2017-03-31 NOTE — Patient Instructions (Signed)
--  May discontinue tessalon.   Continue symbicort twice daily with spacer.   Continue with oxygen while ambulatory.   Use nebulizers at needed.

## 2017-04-01 NOTE — Progress Notes (Signed)
04/03/2017 4:35 PM   Tammy [name] 1960/11/07 [id number]  Referring provider: No referring provider defined for this encounter.  Chief Complaint  Patient presents with  . Urinary Retention    TOV    HPI: 56 yo WF with urinary retention treated with a Foley catheter for bladder decompression and tamsulosin presents today for a TOV.    Background history Patient is a 56 year old Caucasian female who is referred to [place] by, Armando Gang, FNP, for difficulty urinating and pain with her caregiver, Maurine Minister.  Patient states that she has had pain and difficulty with urination for one month.   She did have back pain at that time.  She is having associated hesitancy and straining to urinate.   She does have a history of urinary tract infections.  Her caregiver states that she has had 3 or 4 UTI's over the last two years.  She does not have a history of STI's or injury to the bladder.  She endorse suprapubic pain.  She denies dysuria, gross hematuria, back pain, abdominal pain or flank pain.   She has not had any recent fevers, chills, nausea or vomiting.  She does not have a history of nephrolithiasis, GU surgery or GU trauma.  She is not sexually active.  She is post menopausal.   She denies constipation and/or diarrhea.   She is having with bladder filling.  CT scan performed at [facility] in 02/2017 noted bladder distention.  I do not have access to these films.  Her PVR today is 449 mL.  Her serum creatinine has been elevated intermittently over the last 2 weeks. It is been as high as 1.27 and also has been in the normal range of 0.87.  She denies any recent back injuries, DM, neurological conditions, new medications or numbness in the saddle area.    A Foley was placed at her visit 2 weeks ago and she was started on tamsulosin.  Foley is removed for a TOV.  KUB taken did not demonstrate any constipation.  She is taking the tamsulosin daily.  She denies fevers, chills, nausea and  vomiting. She also denies dysuria, gross hematuria and suprapubic pain.    PMH: Past Medical History:  Diagnosis Date  . Anxiety   . Asthma   . Bipolar 1 disorder (HCC)   . CHF (congestive heart failure) (HCC)   . COPD (chronic obstructive pulmonary disease) (HCC)   . Dementia   . Epilepsy (HCC)   . Seizures [facility])     Surgical History: Past Surgical History:  Procedure Laterality Date  . APPENDECTOMY    . hernia reapir     x 3      Home Medications:  Allergies as of 04/03/2017      Reactions   Penicillins Rash   Prednisone Rash      Medication List       Accurate as of 04/03/17  4:35 PM. Always use your most recent med list.          albuterol 108 (90 Base) MCG/ACT inhaler Commonly known as:  PROVENTIL HFA;VENTOLIN HFA Inhale 4-6 puffs by mouth every 4 hours as needed for wheezing, cough, and/or shortness of breath   alendronate 70 MG/75ML solution Commonly known as:  FOSAMAX Take 70 mg by mouth every 7 (seven) days. Take with a full glass of water on an empty stomach.   benzonatate 100 MG capsule Commonly known as:  TESSALON Take 100 mg by mouth 3 (three) times  daily as needed for cough.   benztropine 1 MG tablet Commonly known as:  COGENTIN Take 1 tablet (1 mg total) by mouth 2 (two) times daily.   budesonide-formoterol 80-4.5 MCG/ACT inhaler Commonly known as:  SYMBICORT Inhale 2 puffs into the lungs 2 (two) times daily.   busPIRone 15 MG tablet Commonly known as:  BUSPAR Take 5 mg by mouth 2 (two) times daily.   calcium carbonate 1500 (600 Ca) MG Tabs tablet Commonly known as:  OSCAL Take 600 mg of elemental calcium by mouth 2 (two) times daily with a meal.   carvedilol 3.125 MG tablet Commonly known as:  COREG Take 3.125 mg by mouth 2 (two) times daily with a meal.   cetirizine 10 MG tablet Commonly known as:  ZYRTEC Take 10 mg by mouth daily.   Fish Oil 1000 MG Caps Take 1 capsule by mouth 2 (two) times daily.   fluticasone 50 MCG/ACT  nasal spray Commonly known as:  FLONASE Place 1 spray into both nostrils daily.   Levothyroxine Sodium 100 MCG Caps Take 100 mcg by mouth daily before breakfast.   lithium carbonate 300 MG capsule Take 300 mg by mouth 2 (two) times daily with a meal.   nystatin powder Generic drug:  nystatin Apply topically 2 (two) times daily.   risperiDONE 1 MG tablet Commonly known as:  RISPERDAL Take 1 mg by mouth 2 (two) times daily.   tamsulosin 0.4 MG Caps capsule Commonly known as:  FLOMAX Take 1 capsule (0.4 mg total) by mouth daily.   Vitamin D3 5000 units Caps Take 1 capsule by mouth daily.       Allergies:  Allergies  Allergen Reactions  . Penicillins Rash  . Prednisone Rash    Family History: Family History  Problem Relation Age of Onset  . Hypertension Mother   . Kidney cancer Neg Hx   . Bladder Cancer Neg Hx     Social History:  reports that she quit smoking about 18 years ago. She has never used smokeless tobacco. She reports that she does not drink alcohol or use drugs.  ROS:                                        Physical Exam: BP 118/74   Pulse 66   Ht 5\' 2"  (1.575 m)   Wt 179 lb 1.6 oz (81.2 kg)   BMI 32.76 kg/m   Constitutional: Well nourished. Alert and oriented, No acute distress. HEENT: [place] AT, moist mucus membranes. Trachea midline, no masses. Cardiovascular: No clubbing, cyanosis, or edema. Respiratory: Normal respiratory effort, no increased work of breathing. Skin: No rashes, bruises or suspicious lesions. Lymph: No cervical or inguinal adenopathy. Neurologic: Grossly intact, no focal deficits, moving all 4 extremities. Psychiatric: Normal mood and affect.  Laboratory Data: Lab Results  Component Value Date   WBC 9.3 03/16/2017   HGB 12.9 03/16/2017   HCT 41.1 03/16/2017   MCV 87.3 03/16/2017   PLT 179 03/16/2017    Lab Results  Component Value Date   CREATININE 1.08 (H) 03/17/2017       Assessment &  Plan:    1. Urinary retention  - etiology is unclear at this time  - continue Flomax 0.4 mg daily  - will recommend timed voids once Foley is removed in addition to the Flomax - attempting to void q 2 hours while awake  -  RTC in one month for OAB questionnaire and PVR  Return in about 1 month (around 05/03/2017) for PVR and OAB questionnaire.  These notes generated with voice recognition software. I apologize for typographical errors.  Michiel CowboySHANNON Sarann Tregre, PA-C  [facility] [address], [address] [place], [place] [address] [phone]

## 2017-04-03 ENCOUNTER — Ambulatory Visit (INDEPENDENT_AMBULATORY_CARE_PROVIDER_SITE_OTHER): Payer: Medicare Other | Admitting: Urology

## 2017-04-03 ENCOUNTER — Encounter: Payer: Self-pay | Admitting: Urology

## 2017-04-03 VITALS — BP 118/74 | HR 66 | Ht 62.0 in | Wt 179.1 lb

## 2017-04-03 DIAGNOSIS — R339 Retention of urine, unspecified: Secondary | ICD-10-CM | POA: Diagnosis not present

## 2017-04-03 NOTE — Progress Notes (Signed)
Catheter Removal  Patient is present today for a catheter removal.  9ml of water was drained from the balloon. A 16FR foley cath was removed from the bladder no complications were noted . Patient tolerated well.  Preformed by: Ramona Williams CMA  

## 2017-04-04 NOTE — Progress Notes (Signed)
[facility]  Patient Status: not taken under care, [facility] is receiving care from another agency. Name not known.   Dimple CaseyJason E Hinton 04/04/2017, 9:13 AM

## 2017-04-14 ENCOUNTER — Emergency Department
Admission: EM | Admit: 2017-04-14 | Discharge: 2017-04-14 | Disposition: A | Discharge status: Home / Self Care | Payer: Medicare Other | Attending: Emergency Medicine | Admitting: Emergency Medicine

## 2017-04-14 ENCOUNTER — Emergency Department: Payer: Medicare Other

## 2017-04-14 DIAGNOSIS — Z87891 Personal history of nicotine dependence: Secondary | ICD-10-CM | POA: Diagnosis not present

## 2017-04-14 DIAGNOSIS — I509 Heart failure, unspecified: Secondary | ICD-10-CM | POA: Diagnosis not present

## 2017-04-14 DIAGNOSIS — J441 Chronic obstructive pulmonary disease with (acute) exacerbation: Secondary | ICD-10-CM | POA: Diagnosis not present

## 2017-04-14 DIAGNOSIS — Z79899 Other long term (current) drug therapy: Secondary | ICD-10-CM | POA: Insufficient documentation

## 2017-04-14 DIAGNOSIS — R0602 Shortness of breath: Secondary | ICD-10-CM | POA: Diagnosis present

## 2017-04-14 DIAGNOSIS — J45909 Unspecified asthma, uncomplicated: Secondary | ICD-10-CM | POA: Diagnosis not present

## 2017-04-14 MED ORDER — DEXAMETHASONE SODIUM PHOSPHATE 10 MG/ML IJ SOLN
10.0000 mg | Freq: Once | INTRAMUSCULAR | Status: AC
  Administered 2017-04-14: 10 mg via INTRAVENOUS
  Filled 2017-04-14: qty 1

## 2017-04-14 MED ORDER — METHYLPREDNISOLONE SODIUM SUCC 125 MG IJ SOLR
125.0000 mg | Freq: Once | INTRAMUSCULAR | Status: AC
  Administered 2017-04-14: 125 mg via INTRAVENOUS
  Filled 2017-04-14: qty 2

## 2017-04-14 MED ORDER — IPRATROPIUM-ALBUTEROL 0.5-2.5 (3) MG/3ML IN SOLN
3.0000 mL | Freq: Once | RESPIRATORY_TRACT | Status: AC
Start: 2017-04-14 — End: 2017-04-14
  Administered 2017-04-14: 3 mL via RESPIRATORY_TRACT
  Filled 2017-04-14: qty 3

## 2017-04-14 MED ORDER — MAGNESIUM SULFATE 2 GM/50ML IV SOLN
2.0000 g | Freq: Once | INTRAVENOUS | Status: AC
Start: 2017-04-14 — End: 2017-04-14
  Administered 2017-04-14: 2 g via INTRAVENOUS
  Filled 2017-04-14: qty 50

## 2017-04-14 MED ORDER — DEXAMETHASONE 6 MG PO TABS: 12.0000 mg | ORAL_TABLET | Freq: Once | ORAL | 0 refills | Status: AC

## 2017-04-14 MED ORDER — IPRATROPIUM-ALBUTEROL 0.5-2.5 (3) MG/3ML IN SOLN
3.0000 mL | Freq: Once | RESPIRATORY_TRACT | Status: AC
  Administered 2017-04-14: 3 mL via RESPIRATORY_TRACT
  Filled 2017-04-14: qty 3

## 2017-04-14 MED ORDER — SODIUM CHLORIDE 0.9 % IV BOLUS (SEPSIS): 1000.0000 mL | Freq: Once | INTRAVENOUS | Status: DC

## 2017-04-14 MED ORDER — SODIUM CHLORIDE 0.9 % IV BOLUS (SEPSIS)
500.0000 mL | Freq: Once | INTRAVENOUS | Status: AC
  Administered 2017-04-14: 500 mL via INTRAVENOUS

## 2017-04-14 NOTE — ED Notes (Signed)
X-ray at bedside

## 2017-04-14 NOTE — ED Provider Notes (Signed)
[facility] Emergency Department Provider Note  ____________________________________________   First MD Initiated Contact with Patient 04/14/17 2005     (approximate)  I have reviewed the triage vital signs and the nursing notes.   HISTORY  Chief Complaint Shortness of Breath   HPI Tammy [name] is a 56 y.o. female who comes to the emergency department via EMS for shortness of breath that began today. She has a long-standing history of COPD and is on 2 L of oxygen at baseline. She normally coughs up clear white sputum every day and she's had no change in her sputum recently. She took several nebulizations at home and it did not alleviate her shortness of breath so she called 911.She did have a recent hospitalization for COPD exacerbation requiring BiPAP. She denies fevers or chills. She feels sharp intermittent brief episodes of upper chest pain worse when coughing improved when not coughing.   Past Medical History:  Diagnosis Date  . Anxiety   . Asthma   . Bipolar 1 disorder (HCC)   . CHF (congestive heart failure) (HCC)   . COPD (chronic obstructive pulmonary disease) (HCC)   . Dementia   . Epilepsy (HCC)   . Seizures [facility])     Patient Active Problem List   Diagnosis Date Noted  . Acute on chronic respiratory failure with hypoxia (HCC) 03/15/2017  . COPD with acute exacerbation (HCC) 03/15/2017  . Acute on chronic respiratory failure (HCC) 03/15/2017    Past Surgical History:  Procedure Laterality Date  . APPENDECTOMY    . hernia reapir     x 3      Prior to Admission medications   Medication Sig Start Date End Date Taking? Authorizing Provider  albuterol (PROVENTIL HFA;VENTOLIN HFA) 108 (90 BASE) MCG/ACT inhaler Inhale 4-6 puffs by mouth every 4 hours as needed for wheezing, cough, and/or shortness of breath 07/01/15   Loleta Rose, MD  alendronate (FOSAMAX) 70 MG/75ML solution Take 70 mg by mouth every 7 (seven) days. Take with a  full glass of water on an empty stomach.    [provider]  benzonatate (TESSALON) 100 MG capsule Take 100 mg by mouth 3 (three) times daily as needed for cough.    [provider]  benztropine (COGENTIN) 1 MG tablet Take 1 tablet (1 mg total) by mouth 2 (two) times daily. 07/03/15 03/31/17  Emily Filbert, MD  budesonide-formoterol (SYMBICORT) 80-4.5 MCG/ACT inhaler Inhale 2 puffs into the lungs 2 (two) times daily.    [provider]  busPIRone (BUSPAR) 15 MG tablet Take 5 mg by mouth 2 (two) times daily.     [provider]  calcium carbonate (OSCAL) 1500 (600 Ca) MG TABS tablet Take 600 mg of elemental calcium by mouth 2 (two) times daily with a meal.    [provider]  carvedilol (COREG) 3.125 MG tablet Take 3.125 mg by mouth 2 (two) times daily with a meal.    [provider]  cetirizine (ZYRTEC) 10 MG tablet Take 10 mg by mouth daily.    [provider]  Cholecalciferol (VITAMIN D3) 5000 units CAPS Take 1 capsule by mouth daily.    [provider]  fluticasone (FLONASE) 50 MCG/ACT nasal spray Place 1 spray into both nostrils daily.     [provider]  Levothyroxine Sodium 100 MCG CAPS Take 100 mcg by mouth daily before breakfast.     [provider]  lithium carbonate 300 MG capsule Take 300 mg  by mouth 2 (two) times daily with a meal.    [provider]  nystatin (NYSTATIN) powder Apply topically 2 (two) times daily.    [provider]  Omega-3 Fatty Acids (FISH OIL) 1000 MG CAPS Take 1 capsule by mouth 2 (two) times daily.    [provider]  risperiDONE (RISPERDAL) 1 MG tablet Take 1 mg by mouth 2 (two) times daily.     [provider]  tamsulosin (FLOMAX) 0.4 MG CAPS capsule Take 1 capsule (0.4 mg total) by mouth daily. 03/25/17   Michiel Cowboy A, PA-C    Allergies Penicillins and Prednisone  Family History  Problem Relation Age of Onset  .  Hypertension Mother   . Kidney cancer Neg Hx   . Bladder Cancer Neg Hx     Social History Social History  Substance Use Topics  . Smoking status: Former Smoker    Quit date: 10/07/1998  . Smokeless tobacco: Never Used  . Alcohol use No    Review of Systems Constitutional: No fever/chills Eyes: No visual changes. ENT: No sore throat. Cardiovascular: Positive chest pain. Respiratory: Positive shortness of breath. Gastrointestinal: No abdominal pain.  No nausea, no vomiting.  No diarrhea.  No constipation. Genitourinary: Negative for dysuria. Musculoskeletal: Negative for back pain. Skin: Negative for rash. Neurological: Negative for headaches, focal weakness or numbness.   ____________________________________________   PHYSICAL EXAM:  VITAL SIGNS: ED Triage Vitals [04/14/17 2003]  Enc Vitals Group     BP      Pulse      Resp      Temp      Temp src      SpO2 93 %     Weight      Height      Head Circumference      Peak Flow      Pain Score      Pain Loc      Pain Edu?      Excl. in GC?     Constitutional: Alert and oriented 4 morbidly obese and appears somewhat short of breath no audible wheezing Eyes: PERRL EOMI. Head: Atraumatic. Nose: No congestion/rhinnorhea. Mouth/Throat: No trismus Neck: No stridor.   Cardiovascular: Normal rate, regular rhythm. Grossly normal heart sounds.  Good peripheral circulation. Respiratory: Increased respiratory effort with mild wheezes throughout no accessory muscle use Gastrointestinal: Obese soft nontender Musculoskeletal: No lower extremity edema   Neurologic:  Normal speech and language. No gross focal neurologic deficits are appreciated. Skin:  Skin is warm, dry and intact. No rash noted.     ____________________________________________   DIFFERENTIAL includes but not limited to  COPD exacerbation, pneumonia, lobar collapse, pneumothorax, pulmonary embolism   LABS (all labs ordered are listed, but only  abnormal results are displayed)  Labs Reviewed - No data to display   __________________________________________  EKG  ED ECG REPORT I, Merrily Brittle, the attending physician, personally viewed and interpreted this ECG.  Date: 04/14/2017 EKG Time:  Rate: 77 Rhythm: normal sinus rhythm QRS Axis: Rightward Intervals: normal ST/T Wave abnormalities: normal Narrative Interpretation: Abnormal but no signs of acute ischemia  ____________________________________________  RADIOLOGY  Chest x-ray with no evidence of pneumonia____________________________________________   PROCEDURES  Procedure(s) performed: no  Procedures  Critical Care performed: no  Observation: no ____________________________________________   INITIAL IMPRESSION / ASSESSMENT AND PLAN / ED COURSE  Pertinent labs & imaging results that were available during my care of the patient were reviewed by me and considered in my medical  decision making (see chart for details).  The patient arrives saturating well and overall relatively well-appearing although she guarded he received steroids and multiple doses of albuterol prior to my seeing her. Her exacerbation began today so I do not believe she is significantly dehydrated. Magnesium fluids breathing treatments are all pending.  After 2 breathing treatments and Solu-Medrol the patient feels significantly improved. She reports an allergy to penicillin but not to Decadron so I will give her a dose of Decadron now to cover her for the next 3 days and a home dose in 3 days to last the next 3 days. She is discharged home with strict return precautions in improved condition.      ____________________________________________   FINAL CLINICAL IMPRESSION(S) / ED DIAGNOSES  Final diagnoses:  COPD exacerbation (HCC)      NEW MEDICATIONS STARTED DURING THIS VISIT:  Discharge Medication List as of 04/14/2017  9:28 PM    START taking these medications   Details    dexamethasone (DECADRON) 6 MG tablet Take 2 tablets (12 mg total) by mouth once., Starting Mon 04/14/2017, Print         Note:  This document was prepared using Dragon voice recognition software and may include unintentional dictation errors.     Merrily Brittleifenbark, Marquetta Weiskopf, MD 04/15/17 1422

## 2017-04-14 NOTE — Discharge Instructions (Signed)
Fortunately today you chest x-ray is reassuring and your lungs are doing well. Please take a repeat dose of Decadron on July 12. Continue using your albuterol inhaler and nebulizer as needed. Return to the emergency department for any concerns.  It was a pleasure to take care of you today, and thank you for coming to our emergency department.  If you have any questions or concerns before leaving please ask the nurse to grab me and I'm more than happy to go through your aftercare instructions again.  If you were prescribed any opioid pain medication today such as Norco, Vicodin, Percocet, morphine, hydrocodone, or oxycodone please make sure you do not drive when you are taking this medication as it can alter your ability to drive safely.  If you have any concerns once you are home that you are not improving or are in fact getting worse before you can make it to your follow-up appointment, please do not hesitate to call 911 and come back for further evaluation.  Merrily BrittleNeil Bliss Tsang MD  Results for orders placed or performed in visit on 03/25/17  BLADDER SCAN AMB NON-IMAGING  Result Value Ref Range   Scan Result 449    Dg Abd 1 View  Result Date: 03/25/2017 CLINICAL DATA:  Pain across lower abdomen for 5 days, bladder infection, urinary retention EXAM: ABDOMEN - 1 VIEW COMPARISON:  08/15/2013 FINDINGS: Lung bases clear. Numerous surgical clips in upper abdomen bilaterally. Scattered gas and stool throughout colon. Nonobstructive bowel gas pattern. No bowel dilatation or bowel wall thickening. No definite urinary tract calcification or acute osseous findings. IMPRESSION: No acute abnormalities. Electronically Signed   By: Ulyses SouthwardMark  Boles M.D.   On: 03/25/2017 20:29   Dg Chest Port 1 View  Result Date: 04/14/2017 CLINICAL DATA:  56 y/o  F; shortness of breath and history of COPD. EXAM: PORTABLE CHEST 1 VIEW COMPARISON:  03/15/2017 chest radiograph FINDINGS: Stable cardiac silhouette given projection and  technique. Aortic atherosclerosis with calcification. Pulmonary venous hypertension. No focal consolidation. No pleural effusion or pneumothorax. Bones are unremarkable. IMPRESSION: Pulmonary venous hypertension. Aortic atherosclerosis. No focal consolidation. Electronically Signed   By: Mitzi HansenLance  Furusawa-Stratton M.D.   On: 04/14/2017 20:25

## 2017-04-14 NOTE — ED Triage Notes (Signed)
Per EMS: Tammy BiblePat c/o SOB. Pt had expiratory wheezes all fields. Pt hx of COPD, asthma and CHF. Pt always on 2L [place]/

## 2017-04-14 NOTE — ED Notes (Signed)
RN to room for rounding and to check on completion of medication.  Pt had removed IV. Patient questioned as to why she removed IV. Pt denied removing IV.   MD informed

## 2017-04-14 NOTE — ED Notes (Signed)
Reviewed d/c instructions, follow-up care, prescription with patient and caregiver from facility.  Pt and caregiver verbalized understanding

## 2017-05-04 NOTE — Progress Notes (Deleted)
05/05/2017 1:33 PM   Tammy [name] [birth year] [id number]  Referring provider: Armando Gang, FNP [address] [place], [place] [address]  No chief complaint on file.   HPI: 56 yo WF with a history of urinary retention who underwent a TOV one month ago.    Background history Patient is a 57 year old Caucasian female who is referred to [place] by, Armando Gang, FNP, for difficulty urinating and pain with her caregiver, Maurine Minister.  Patient states that she has had pain and difficulty with urination for one month.   She did have back pain at that time.  She is having associated hesitancy and straining to urinate.   She does have a history of urinary tract infections.  Her caregiver states that she has had 3 or 4 UTI's over the last two years.  She does not have a history of STI's or injury to the bladder.  She endorse suprapubic pain.  She denies dysuria, gross hematuria, back pain, abdominal pain or flank pain.   She has not had any recent fevers, chills, nausea or vomiting.  She does not have a history of nephrolithiasis, GU surgery or GU trauma.  She is not sexually active.  She is post menopausal.   She denies constipation and/or diarrhea.   She is having with bladder filling.  CT scan performed at [facility] in 02/2017 noted bladder distention.  I do not have access to these films.  Her PVR today is 449 mL.  Her serum creatinine has been elevated intermittently over the last 2 weeks. It is been as high as 1.27 and also has been in the normal range of 0.87.  She denies any recent back injuries, DM, neurological conditions, new medications or numbness in the saddle area.    Interval history A Foley was placed at her visit 2 weeks ago and she was started on tamsulosin.  Foley is removed for a TOV.  KUB taken did not demonstrate any constipation.  She is taking the tamsulosin daily.  She denies fevers, chills, nausea and vomiting. She also denies dysuria, gross hematuria and  suprapubic pain.  Today, the patient has been experiencing urgency x *** (***), frequency x *** (***), not/is restricting fluids to avoid visits to the restroom ***, not/is engaging in toilet mapping, incontinence x *** (***) and nocturia x *** (***).  Her PVR is ***.  She denies any dysuria, gross hematuria or suprapubic pain. She also denies any fevers, chills, nausea and vomiting.   PMH: Past Medical History:  Diagnosis Date  . Anxiety   . Asthma   . Bipolar 1 disorder (HCC)   . CHF (congestive heart failure) (HCC)   . COPD (chronic obstructive pulmonary disease) (HCC)   . Dementia   . Epilepsy (HCC)   . Seizures [facility])     Surgical History: Past Surgical History:  Procedure Laterality Date  . APPENDECTOMY    . hernia reapir     x 3      Home Medications:  Allergies as of 05/05/2017      Reactions   Penicillins Rash   Prednisone Rash      Medication List       Accurate as of 05/04/17  1:33 PM. Always use your most recent med list.          albuterol 108 (90 Base) MCG/ACT inhaler Commonly known as:  PROVENTIL HFA;VENTOLIN HFA Inhale 4-6 puffs by mouth every 4 hours as needed for wheezing, cough, and/or shortness  of breath   alendronate 70 MG/75ML solution Commonly known as:  FOSAMAX Take 70 mg by mouth every 7 (seven) days. Take with a full glass of water on an empty stomach.   benzonatate 100 MG capsule Commonly known as:  TESSALON Take 100 mg by mouth 3 (three) times daily as needed for cough.   benztropine 1 MG tablet Commonly known as:  COGENTIN Take 1 tablet (1 mg total) by mouth 2 (two) times daily.   budesonide-formoterol 80-4.5 MCG/ACT inhaler Commonly known as:  SYMBICORT Inhale 2 puffs into the lungs 2 (two) times daily.   busPIRone 15 MG tablet Commonly known as:  BUSPAR Take 5 mg by mouth 2 (two) times daily.   calcium carbonate 1500 (600 Ca) MG Tabs tablet Commonly known as:  OSCAL Take 600 mg of elemental calcium by mouth 2 (two) times  daily with a meal.   carvedilol 3.125 MG tablet Commonly known as:  COREG Take 3.125 mg by mouth 2 (two) times daily with a meal.   cetirizine 10 MG tablet Commonly known as:  ZYRTEC Take 10 mg by mouth daily.   Fish Oil 1000 MG Caps Take 1 capsule by mouth 2 (two) times daily.   fluticasone 50 MCG/ACT nasal spray Commonly known as:  FLONASE Place 1 spray into both nostrils daily.   Levothyroxine Sodium 100 MCG Caps Take 100 mcg by mouth daily before breakfast.   lithium carbonate 300 MG capsule Take 300 mg by mouth 2 (two) times daily with a meal.   nystatin powder Generic drug:  nystatin Apply topically 2 (two) times daily.   risperiDONE 1 MG tablet Commonly known as:  RISPERDAL Take 1 mg by mouth 2 (two) times daily.   tamsulosin 0.4 MG Caps capsule Commonly known as:  FLOMAX Take 1 capsule (0.4 mg total) by mouth daily.   Vitamin D3 5000 units Caps Take 1 capsule by mouth daily.       Allergies:  Allergies  Allergen Reactions  . Penicillins Rash  . Prednisone Rash    Family History: Family History  Problem Relation Age of Onset  . Hypertension Mother   . Kidney cancer Neg Hx   . Bladder Cancer Neg Hx     Social History:  reports that she quit smoking about 18 years ago. She has never used smokeless tobacco. She reports that she does not drink alcohol or use drugs.  ROS:                                        Physical Exam: There were no vitals taken for this visit.  Constitutional: Well nourished. Alert and oriented, No acute distress. HEENT: [place] AT, moist mucus membranes. Trachea midline, no masses. Cardiovascular: No clubbing, cyanosis, or edema. Respiratory: Normal respiratory effort, no increased work of breathing. Skin: No rashes, bruises or suspicious lesions. Lymph: No cervical or inguinal adenopathy. Neurologic: Grossly intact, no focal deficits, moving all 4 extremities. Psychiatric: Normal mood and  affect.  Laboratory Data: Lab Results  Component Value Date   WBC 9.3 03/16/2017   HGB 12.9 03/16/2017   HCT 41.1 03/16/2017   MCV 87.3 03/16/2017   PLT 179 03/16/2017    Lab Results  Component Value Date   CREATININE 1.08 (H) 03/17/2017    I have reviewed the labs   Assessment & Plan:    1. History of urinary retention  -  etiology is unclear at this time  - continue Flomax 0.4 mg daily  - will recommend timed voids once Foley is removed in addition to the Flomax - attempting to void q 2 hours while awake  - RTC in one month for OAB questionnaire and PVR  No Follow-up on file.  These notes generated with voice recognition software. I apologize for typographical errors.  Michiel CowboySHANNON Reyes Aldaco, PA-C  [facility] [address], [address] [place], [place] [address] [phone]

## 2017-05-05 ENCOUNTER — Encounter: Payer: Self-pay | Admitting: Urology

## 2017-05-05 ENCOUNTER — Ambulatory Visit: Payer: Medicare Other | Admitting: Urology

## 2017-06-16 ENCOUNTER — Ambulatory Visit (INDEPENDENT_AMBULATORY_CARE_PROVIDER_SITE_OTHER): Payer: Medicare Other | Admitting: Podiatry

## 2017-06-16 DIAGNOSIS — E119 Type 2 diabetes mellitus without complications: Secondary | ICD-10-CM

## 2017-06-16 DIAGNOSIS — B351 Tinea unguium: Secondary | ICD-10-CM | POA: Diagnosis not present

## 2017-06-16 DIAGNOSIS — M79609 Pain in unspecified limb: Secondary | ICD-10-CM | POA: Diagnosis not present

## 2017-06-16 NOTE — Progress Notes (Signed)
Complaint:  Visit Type: Patient returns to my office for continued preventative foot care services. Complaint: Patient states" my nails have grown long and thick and become painful to walk and wear shoes" Patient has been diagnosed with DM and now takes metformin.. The patient presents for preventative foot care services. No changes to ROS  Podiatric Exam: Vascular: dorsalis pedis and posterior tibial pulses are palpable bilateral. Capillary return is immediate. Temperature gradient is WNL. Skin turgor WNL  Sensorium: Normal Semmes Weinstein monofilament test. Normal tactile sensation bilaterally. Nail Exam: Pt has thick disfigured discolored nails with subungual debris noted bilateral entire nail hallux through fifth toenails Ulcer Exam: There is no evidence of ulcer or pre-ulcerative changes or infection. Orthopedic Exam: Muscle tone and strength are WNL. No limitations in general ROM. No crepitus or effusions noted. Foot type and digits show no abnormalities. Bony prominences are unremarkable. Dorsal  DJD  B/L Skin: No Porokeratosis. No infection or ulcers  Diagnosis:  Onychomycosis, , Pain in right toe, pain in left toes  Treatment & Plan Procedures and Treatment: Consent by patient was obtained for treatment procedures.   Debridement of mycotic and hypertrophic toenails, 1 through 5 bilateral and clearing of subungual debris. No ulceration, no infection noted.  Return Visit-Office Procedure: Patient instructed to return to the office for a follow up visit 3 months for continued evaluation and treatment.    Helane GuntherGregory Noor Witte DPM

## 2017-06-23 ENCOUNTER — Observation Stay
Admission: EM | Admit: 2017-06-23 | Discharge: 2017-06-25 | Disposition: A | Discharge status: Home / Self Care | Payer: Medicare Other | Attending: Internal Medicine | Admitting: Internal Medicine

## 2017-06-23 ENCOUNTER — Encounter: Payer: Self-pay | Admitting: Emergency Medicine

## 2017-06-23 ENCOUNTER — Emergency Department: Payer: Medicare Other

## 2017-06-23 DIAGNOSIS — J449 Chronic obstructive pulmonary disease, unspecified: Secondary | ICD-10-CM | POA: Diagnosis not present

## 2017-06-23 DIAGNOSIS — I371 Nonrheumatic pulmonary valve insufficiency: Secondary | ICD-10-CM | POA: Diagnosis not present

## 2017-06-23 DIAGNOSIS — Z880 Allergy status to penicillin: Secondary | ICD-10-CM | POA: Insufficient documentation

## 2017-06-23 DIAGNOSIS — I509 Heart failure, unspecified: Secondary | ICD-10-CM | POA: Insufficient documentation

## 2017-06-23 DIAGNOSIS — E039 Hypothyroidism, unspecified: Secondary | ICD-10-CM | POA: Diagnosis not present

## 2017-06-23 DIAGNOSIS — I110 Hypertensive heart disease with heart failure: Secondary | ICD-10-CM | POA: Diagnosis not present

## 2017-06-23 DIAGNOSIS — Z87891 Personal history of nicotine dependence: Secondary | ICD-10-CM | POA: Diagnosis not present

## 2017-06-23 DIAGNOSIS — F0390 Unspecified dementia without behavioral disturbance: Secondary | ICD-10-CM | POA: Diagnosis not present

## 2017-06-23 DIAGNOSIS — W19XXXA Unspecified fall, initial encounter: Secondary | ICD-10-CM | POA: Insufficient documentation

## 2017-06-23 DIAGNOSIS — F419 Anxiety disorder, unspecified: Secondary | ICD-10-CM | POA: Diagnosis not present

## 2017-06-23 DIAGNOSIS — G40909 Epilepsy, unspecified, not intractable, without status epilepticus: Secondary | ICD-10-CM | POA: Diagnosis not present

## 2017-06-23 DIAGNOSIS — F319 Bipolar disorder, unspecified: Secondary | ICD-10-CM | POA: Diagnosis not present

## 2017-06-23 DIAGNOSIS — R55 Syncope and collapse: Principal | ICD-10-CM | POA: Insufficient documentation

## 2017-06-23 DIAGNOSIS — E871 Hypo-osmolality and hyponatremia: Secondary | ICD-10-CM | POA: Insufficient documentation

## 2017-06-23 DIAGNOSIS — I672 Cerebral atherosclerosis: Secondary | ICD-10-CM | POA: Insufficient documentation

## 2017-06-23 DIAGNOSIS — G8929 Other chronic pain: Secondary | ICD-10-CM | POA: Diagnosis not present

## 2017-06-23 DIAGNOSIS — I083 Combined rheumatic disorders of mitral, aortic and tricuspid valves: Secondary | ICD-10-CM | POA: Diagnosis not present

## 2017-06-23 DIAGNOSIS — Z79899 Other long term (current) drug therapy: Secondary | ICD-10-CM | POA: Insufficient documentation

## 2017-06-23 DIAGNOSIS — Z888 Allergy status to other drugs, medicaments and biological substances status: Secondary | ICD-10-CM | POA: Diagnosis not present

## 2017-06-23 DIAGNOSIS — R8271 Bacteriuria: Secondary | ICD-10-CM | POA: Diagnosis present

## 2017-06-23 DIAGNOSIS — M546 Pain in thoracic spine: Secondary | ICD-10-CM

## 2017-06-23 DIAGNOSIS — M549 Dorsalgia, unspecified: Secondary | ICD-10-CM | POA: Diagnosis not present

## 2017-06-23 DIAGNOSIS — R197 Diarrhea, unspecified: Secondary | ICD-10-CM | POA: Insufficient documentation

## 2017-06-23 LAB — BRAIN NATRIURETIC PEPTIDE: B NATRIURETIC PEPTIDE 5: 17 pg/mL (ref 0.0–100.0)

## 2017-06-23 LAB — URINALYSIS, COMPLETE (UACMP) WITH MICROSCOPIC
Bilirubin Urine: NEGATIVE
Glucose, UA: NEGATIVE mg/dL
Hgb urine dipstick: NEGATIVE
KETONES UR: NEGATIVE mg/dL
LEUKOCYTES UA: NEGATIVE
Nitrite: NEGATIVE
PROTEIN: NEGATIVE mg/dL
Specific Gravity, Urine: 1.003 — ABNORMAL LOW (ref 1.005–1.030)
pH: 6 (ref 5.0–8.0)

## 2017-06-23 LAB — BASIC METABOLIC PANEL
Anion gap: 8 (ref 5–15)
BUN: 18 mg/dL (ref 6–20)
CHLORIDE: 98 mmol/L — AB (ref 101–111)
CO2: 20 mmol/L — ABNORMAL LOW (ref 22–32)
Calcium: 9.3 mg/dL (ref 8.9–10.3)
Creatinine, Ser: 0.95 mg/dL (ref 0.44–1.00)
GFR calc Af Amer: >60 mL/min (ref >60)
GFR calc non Af Amer: >60 mL/min (ref >60)
Glucose, Bld: 92 mg/dL (ref 65–99)
POTASSIUM: 4.9 mmol/L (ref 3.5–5.1)
SODIUM: 126 mmol/L — AB (ref 135–145)

## 2017-06-23 LAB — CBC
HEMATOCRIT: 40.3 % (ref 35.0–47.0)
HEMOGLOBIN: 13 g/dL (ref 12.0–16.0)
MCH: 29 pg (ref 26.0–34.0)
MCHC: 32.3 g/dL (ref 32.0–36.0)
MCV: 89.7 fL (ref 80.0–100.0)
Platelets: 108 10*3/uL — ABNORMAL LOW (ref 150–440)
RBC: 4.5 MIL/uL (ref 3.80–5.20)
RDW: 17.3 % — ABNORMAL HIGH (ref 11.5–14.5)
WBC: 11.2 10*3/uL — AB (ref 3.6–11.0)

## 2017-06-23 LAB — HEPATIC FUNCTION PANEL
ALBUMIN: 3.2 g/dL — AB (ref 3.5–5.0)
ALT: 10 U/L — ABNORMAL LOW (ref 14–54)
AST: 26 U/L (ref 15–41)
Alkaline Phosphatase: 105 U/L (ref 38–126)
Bilirubin, Direct: 0.3 mg/dL (ref 0.1–0.5)
Indirect Bilirubin: 0.3 mg/dL (ref 0.3–0.9)
TOTAL PROTEIN: 5.6 g/dL — AB (ref 6.5–8.1)
Total Bilirubin: 0.6 mg/dL (ref 0.3–1.2)

## 2017-06-23 LAB — TROPONIN I: Troponin I: <0.03 ng/mL (ref <0.03)

## 2017-06-23 LAB — TSH: TSH: 1.746 u[IU]/mL (ref 0.350–4.500)

## 2017-06-23 MED ORDER — SODIUM CHLORIDE 0.9 % IV SOLN
INTRAVENOUS | Status: DC
  Administered 2017-06-23: 22:00:00 via INTRAVENOUS

## 2017-06-23 MED ORDER — ORAL CARE MOUTH RINSE
15.0000 mL | Freq: Two times a day (BID) | OROMUCOSAL | Status: DC
  Administered 2017-06-23 – 2017-06-24 (×2): 15 mL via OROMUCOSAL

## 2017-06-23 MED ORDER — HEPARIN SODIUM (PORCINE) 5000 UNIT/ML IJ SOLN
5000.0000 [IU] | Freq: Three times a day (TID) | INTRAMUSCULAR | Status: DC
  Administered 2017-06-23 – 2017-06-25 (×5): 5000 [IU] via SUBCUTANEOUS
  Filled 2017-06-23 (×5): qty 1

## 2017-06-23 MED ORDER — SODIUM CHLORIDE 0.9 % IV BOLUS (SEPSIS)
1000.0000 mL | Freq: Once | INTRAVENOUS | Status: AC
  Administered 2017-06-23: 1000 mL via INTRAVENOUS

## 2017-06-23 MED ORDER — ALBUTEROL SULFATE (2.5 MG/3ML) 0.083% IN NEBU: 2.5000 mg | INHALATION_SOLUTION | Freq: Four times a day (QID) | RESPIRATORY_TRACT | Status: DC | PRN

## 2017-06-23 MED ORDER — LORATADINE 10 MG PO TABS
10.0000 mg | ORAL_TABLET | Freq: Every day | ORAL | Status: DC
  Administered 2017-06-23 – 2017-06-25 (×3): 10 mg via ORAL
  Filled 2017-06-23 (×3): qty 1

## 2017-06-23 MED ORDER — CARVEDILOL 3.125 MG PO TABS
3.1250 mg | ORAL_TABLET | Freq: Two times a day (BID) | ORAL | Status: DC
  Administered 2017-06-24 – 2017-06-25 (×3): 3.125 mg via ORAL
  Filled 2017-06-23 (×3): qty 1

## 2017-06-23 MED ORDER — ACETAMINOPHEN 500 MG PO TABS
1000.0000 mg | ORAL_TABLET | Freq: Once | ORAL | Status: AC
  Administered 2017-06-23: 1000 mg via ORAL
  Filled 2017-06-23: qty 2

## 2017-06-23 MED ORDER — INFLUENZA VAC SPLIT QUAD 0.5 ML IM SUSY
0.5000 mL | PREFILLED_SYRINGE | INTRAMUSCULAR | Status: AC
  Administered 2017-06-24: 0.5 mL via INTRAMUSCULAR
  Filled 2017-06-23: qty 0.5

## 2017-06-23 MED ORDER — BENZONATATE 100 MG PO CAPS: 100.0000 mg | ORAL_CAPSULE | Freq: Three times a day (TID) | ORAL | Status: DC | PRN

## 2017-06-23 MED ORDER — DOCUSATE SODIUM 100 MG PO CAPS: 100.0000 mg | ORAL_CAPSULE | Freq: Two times a day (BID) | ORAL | Status: DC | PRN

## 2017-06-23 MED ORDER — TAMSULOSIN HCL 0.4 MG PO CAPS
0.4000 mg | ORAL_CAPSULE | Freq: Every day | ORAL | Status: DC
  Administered 2017-06-23 – 2017-06-25 (×3): 0.4 mg via ORAL
  Filled 2017-06-23 (×3): qty 1

## 2017-06-23 MED ORDER — SULFAMETHOXAZOLE-TRIMETHOPRIM 800-160 MG PO TABS
1.0000 | ORAL_TABLET | Freq: Once | ORAL | Status: AC
  Administered 2017-06-23: 1 via ORAL
  Filled 2017-06-23: qty 1

## 2017-06-23 MED ORDER — RISPERIDONE 1 MG PO TABS
1.0000 mg | ORAL_TABLET | Freq: Two times a day (BID) | ORAL | Status: DC
  Administered 2017-06-23 – 2017-06-25 (×4): 1 mg via ORAL
  Filled 2017-06-23 (×5): qty 1

## 2017-06-23 MED ORDER — FLUTICASONE PROPIONATE 50 MCG/ACT NA SUSP
1.0000 | Freq: Every day | NASAL | Status: DC
  Administered 2017-06-23 – 2017-06-25 (×3): 1 via NASAL
  Filled 2017-06-23: qty 16

## 2017-06-23 MED ORDER — ACETAMINOPHEN 325 MG PO TABS
650.0000 mg | ORAL_TABLET | Freq: Four times a day (QID) | ORAL | Status: DC | PRN
  Administered 2017-06-23 – 2017-06-24 (×2): 650 mg via ORAL
  Filled 2017-06-23 (×2): qty 2

## 2017-06-23 MED ORDER — MOMETASONE FURO-FORMOTEROL FUM 100-5 MCG/ACT IN AERO
2.0000 | INHALATION_SPRAY | Freq: Two times a day (BID) | RESPIRATORY_TRACT | Status: DC
  Administered 2017-06-23 – 2017-06-25 (×4): 2 via RESPIRATORY_TRACT
  Filled 2017-06-23: qty 8.8

## 2017-06-23 MED ORDER — BUSPIRONE HCL 5 MG PO TABS
5.0000 mg | ORAL_TABLET | Freq: Two times a day (BID) | ORAL | Status: DC
  Administered 2017-06-23 – 2017-06-25 (×4): 5 mg via ORAL
  Filled 2017-06-23 (×5): qty 1

## 2017-06-23 MED ORDER — LITHIUM CARBONATE 300 MG PO CAPS
300.0000 mg | ORAL_CAPSULE | Freq: Two times a day (BID) | ORAL | Status: DC
  Administered 2017-06-24 – 2017-06-25 (×3): 300 mg via ORAL
  Filled 2017-06-23 (×4): qty 1

## 2017-06-23 MED ORDER — CALCIUM CARBONATE 1500 (600 CA) MG PO TABS
600.0000 mg | ORAL_TABLET | Freq: Two times a day (BID) | ORAL | Status: DC
  Filled 2017-06-23: qty 1

## 2017-06-23 MED ORDER — LEVOTHYROXINE SODIUM 100 MCG PO TABS
100.0000 ug | ORAL_TABLET | Freq: Every day | ORAL | Status: DC
  Administered 2017-06-23 – 2017-06-25 (×2): 100 ug via ORAL
  Filled 2017-06-23 (×3): qty 1

## 2017-06-23 NOTE — ED Provider Notes (Addendum)
[facility] Emergency Department Provider Note  ____________________________________________  Time seen: Approximately 4:03 PM  I have reviewed the triage vital signs and the nursing notes.   HISTORY  Chief Complaint Loss of Consciousness    HPI Tammy [name] is a 56 y.o. female a history of COPD on 2 L nasal cannula, bipolar disorder, CHFpresenting with syncope and back pain. The patient reports that for the past several days, she has felt "bad." She is unable to give any additional characteristics to this. Today, she was walking around in her group home when she had an acute episode of lightheadedness, followed by brief syncopal episode without any reported tonic-clonic activity, urinary or fecal incontinence. She did not have a postictal state. She states that she has chronic back pain, and that this worsened after her fall. She denies any numbness tingling or weakness, saddle anesthesia, urinary or fecal incontinence or retention. The patient denies any recent cough or cold symptoms, or significant change in her baseline shortness of breath. She does say that 3 months ago she had her diabetes medication change, and has had approximately 3 daily episodes of loose, nonbloody diarrhea without any abdominal pain, nausea or vomiting; her doctor has attributed her diarrhea to her medication.   Past Medical History:  Diagnosis Date  . Anxiety   . Asthma   . Bipolar 1 disorder (HCC)   . CHF (congestive heart failure) (HCC)   . COPD (chronic obstructive pulmonary disease) (HCC)   . Dementia   . Epilepsy (HCC)   . Seizures [facility])     Patient Active Problem List   Diagnosis Date Noted  . Acute on chronic respiratory failure with hypoxia (HCC) 03/15/2017  . COPD with acute exacerbation (HCC) 03/15/2017  . Acute on chronic respiratory failure (HCC) 03/15/2017    Past Surgical History:  Procedure Laterality Date  . APPENDECTOMY    . hernia reapir     x 3       Current Outpatient Rx  . Order #: [id number] Class: Print  . Order #: [id number] Class: Historical Med  . Order #: [id number] Class: Historical Med  . Order #: [id number] Class: Print  . Order #: [id number] Class: Historical Med  . Order #: [id number] Class: Historical Med  . Order #: [id number] Class: Historical Med  . Order #: [id number] Class: Historical Med  . Order #: [id number] Class: Historical Med  . Order #: [id number] Class: Historical Med  . Order #: [id number] Class: Historical Med  . Order #: [id number] Class: Historical Med  . Order #: [id number] Class: Historical Med  . Order #: [id number] Class: Historical Med  . Order #: [id number] Class: Historical Med  . Order #: [id number] Class: Historical Med  . Order #: [id number] Class: Normal    Allergies Penicillins and Prednisone  Family History  Problem Relation Age of Onset  . Hypertension Mother   . Kidney cancer Neg Hx   . Bladder Cancer Neg Hx     Social History Social History  Substance Use Topics  . Smoking status: Former Smoker    Quit date: 10/07/1998  . Smokeless tobacco: Never Used  . Alcohol use No    Review of Systems Constitutional: No fever/chills.As of lightheadedness with syncope. Positive fall. Eyes: No visual changes. No blurred or double vision. ENT: No sore throat. No congestion or rhinorrhea. No ear pain. No eye discharge. Cardiovascular: Denies chest pain. Denies palpitations. Respiratory: Denies change in chronic shortness of breath.  No cough. Gastrointestinal: No abdominal pain.  No nausea, no vomiting.  Positive diarrhea for 3 months.  No  constipation. Genitourinary: Negative for dysuria. No pain or burning with urination; no urinary frequency. Musculoskeletal: Positive for acute on chronic back pain after fall. Skin: Negative for rash. Neurological: Negative for headaches. No focal numbness, tingling or weakness. No saddle anesthesia. No difficulty walking. Normal  ambulation.    ____________________________________________   PHYSICAL EXAM:  VITAL SIGNS: ED Triage Vitals  Enc Vitals Group     BP 06/23/17 1411 123/60     Pulse Rate 06/23/17 1411 64     Resp 06/23/17 1411 18     Temp 06/23/17 1411 98.9 F (37.2 C)     Temp Source 06/23/17 1411 Oral     SpO2 06/23/17 1411 98 %     Weight 06/23/17 1412 160 lb (72.6 kg)     Height 06/23/17 1412  (1.575 m)     Head Circumference --      Peak Flow --      Pain Score 06/23/17 1411 8     Pain Loc --      Pain Edu? --      Excl. in GC? --     Constitutional: Alert and oriented. Chronically ill appearing but in no acute distress. Answers questions appropriately. Eyes: Conjunctivae are normal.  EOMI. PERRLA. No scleral icterus. No eye discharge Head: Atraumatic. Nose: No congestion/rhinnorhea. Mouth/Throat: Mucous membranes are mildly dry.  Neck: No stridor.  Supple.  No JVD. No meningismus. No midline C-spine tenderness to palpation, step-offs or deformities. Cardiovascular: Slow rate 59, regular rhythm. No murmurs, rubs or gallops.  Respiratory: Normal respiratory effort.  No accessory muscle use or retractions. Lungs CTAB.  No wheezes, rales or ronchi. Gastrointestinal: Overweight. Soft, nontender and nondistended.  No guarding or rebound.  No peritoneal signs. Musculoskeletal: No LE edema. No ttp in the calves or palpable cords.  Negative Homan's sign. Positive midline lower T- and entire L-spine tenderness to palpation, step-offs or deformities without focality. Neurologic:  A&Ox3.  Speech is clear.  Face and smile are symmetric.  EOMI.  Moves all extremities well.  Skin:  Skin is warm, dry and intact. No rash noted. Psychiatric: Mood and affect are normal. Speech and behavior are normal.  Normal judgement.  ____________________________________________   LABS (all labs ordered are listed, but only abnormal results are displayed)  Labs Reviewed  BASIC METABOLIC PANEL - Abnormal;  Notable for the following:       Result Value   Sodium 126 (*)    Chloride 98 (*)    CO2 20 (*)    All other components within normal limits  CBC - Abnormal; Notable for the following:    WBC 11.2 (*)    RDW 17.3 (*)    Platelets 108 (*)    All other components within normal limits  URINALYSIS, COMPLETE (UACMP) WITH MICROSCOPIC - Abnormal; Notable for the following:    Color, Urine COLORLESS (*)    APPearance CLEAR (*)    Specific Gravity, Urine 1.003 (*)    Bacteria, UA RARE (*)    Squamous Epithelial / LPF 0-5 (*)    All other components within normal limits  URINE CULTURE  C DIFFICILE QUICK SCREEN W PCR REFLEX  GASTROINTESTINAL PANEL BY PCR, STOOL (REPLACES STOOL CULTURE)  TROPONIN I  HEPATIC FUNCTION PANEL  BRAIN NATRIURETIC PEPTIDE  TSH  CBG MONITORING, ED   ____________________________________________  EKG  ED ECG REPORT I, Rockne Menghini, the attending physician, personally viewed and interpreted this ECG.   Date: 06/23/2017  EKG Time: 1421  Rate: 57  Rhythm: sinus bradycardia  Axis: normal  Intervals:none  ST&T Change: No STEMI  ____________________________________________  RADIOLOGY  No results found.  ____________________________________________   PROCEDURES  Procedure(s) performed: None  Procedures  Critical Care performed: No ____________________________________________   INITIAL IMPRESSION / ASSESSMENT AND PLAN / ED COURSE  Pertinent labs & imaging results that were available during my care of the patient were reviewed by me and considered in my medical decision making (see chart for details).  56 y.o. female with a history of COPD on 2 L nasal cannula, DM, presenting with 3 months of diarrhea, and today a syncopal episode after some lightheadedness, now with resulting acute on chronic back pain. Overall, the patient does have some sinus bradycardia, and may be symptomatic from that. This possibly iatrogenic from her medications.  I would also consider hypovolemia, especially in the setting of 3 months of diarrhea although the patient does not have any signs or symptoms of an acute infection or intra-abdominal pathology that would warrant intervention at this time. The patient's laboratory studies are consistent with hyponatremia, which also may contribute her symptoms. Will plan to get orthostatics, treat the patient with intravenous fluids, evaluate the patient's spine with plain x-ray, treat her pain with Tylenol, and admit the patient for further evaluation and treatment. The patient does have rare bacteria in her urine, said send this for culture and given her dose of Bactrim although she is not having significant symptoms of UTI.  ----------------------------------------- 5:55 PM on 06/23/2017 -----------------------------------------  The patient's trauma workup is negative at this time. She will be admitted with hyponatremia, possible UTI for which she has been treated for further evaluation and treatment.  ____________________________________________  FINAL CLINICAL IMPRESSION(S) / ED DIAGNOSES  Final diagnoses:  Syncope, unspecified syncope type  Acute midline thoracic back pain  Hyponatremia  Diarrhea, unspecified type  Bacteriuria         NEW MEDICATIONS STARTED DURING THIS VISIT:  New Prescriptions   No medications on file      Rockne Menghini, MD 06/23/17 1615    Rockne Menghini, MD 06/23/17 1615    Rockne Menghini, MD 06/23/17 1756

## 2017-06-23 NOTE — ED Notes (Signed)
Pt discussed with ED MD, Dr. Mayford Knife, no further orders necessary at this time.

## 2017-06-23 NOTE — ED Notes (Signed)
Pt arrives via EMS from a [facility]behavioral hospital" when she had a syncopal episode, states 2L [place] at all times

## 2017-06-23 NOTE — ED Notes (Signed)
Pt keeps requesting for something to eat. Dr. Sharma Covert informed pt no food yet.

## 2017-06-23 NOTE — ED Triage Notes (Addendum)
Pt in via [facility]; pt reports sudden onset dizziness w/ syncopal episode at approximately 1330 today.  Pt reports falling, denies hitting head, denies taking blood thinners, complains of pain to back from the fall.  Pt states she is from a group home, unable to recall name of home; first nurse note states otherwise per EMS report.  Pt A/Ox3, vitals WDL, NAD noted at this time.

## 2017-06-23 NOTE — H&P (Signed)
[facility] - [place] at [facility]   PATIENT NAME: Tammy [name]    MR#:  [id number]  DATE OF BIRTH:  [birth year]  DATE OF ADMISSION:  06/23/2017  PRIMARY CARE PHYSICIAN: Armando Gang, FNP   REQUESTING/REFERRING PHYSICIAN: Sharma Covert  CHIEF COMPLAINT:   Chief Complaint  Patient presents with  . Loss of Consciousness    HISTORY OF PRESENT ILLNESS: Tammy [name]  is a 56 y.o. female with a known history of Anxiety, Bipolar, COPD, CHF, seizures, dementia- was at a training school today- passed out there and had a fall. Had some palpitation before the episode. Denies focal neurological symptoms. She lost bladder control while passing out. After waking up- saw people around her , they suspected - she have a seizures. She was completely oriented- denies confusion.  Have loose stool for last 1-2 weeks 2- times a day, noted hyponatremia in ER>  PAST MEDICAL HISTORY:   Past Medical History:  Diagnosis Date  . Anxiety   . Asthma   . Bipolar 1 disorder (HCC)   . CHF (congestive heart failure) (HCC)   . COPD (chronic obstructive pulmonary disease) (HCC)   . Dementia   . Epilepsy (HCC)   . Seizures (HCC)     PAST SURGICAL HISTORY: Past Surgical History:  Procedure Laterality Date  . APPENDECTOMY    . hernia reapir     x 3      SOCIAL HISTORY:  Social History  Substance Use Topics  . Smoking status: Former Smoker    Quit date: 10/07/1998  . Smokeless tobacco: Never Used  . Alcohol use No    FAMILY HISTORY:  Family History  Problem Relation Age of Onset  . Hypertension Mother   . Kidney cancer Neg Hx   . Bladder Cancer Neg Hx     DRUG ALLERGIES:  Allergies  Allergen Reactions  . Penicillins Rash  . Prednisone Rash    REVIEW OF SYSTEMS:   CONSTITUTIONAL: No fever, fatigue or weakness.  EYES: No blurred or double vision.  EARS, NOSE, AND THROAT: No tinnitus or ear pain.  RESPIRATORY: No cough, shortness of breath, wheezing or hemoptysis.   CARDIOVASCULAR: No chest pain, orthopnea, edema.  GASTROINTESTINAL: No nausea, vomiting, diarrhea or abdominal pain.  GENITOURINARY: No dysuria, hematuria.  ENDOCRINE: No polyuria, nocturia,  HEMATOLOGY: No anemia, easy bruising or bleeding SKIN: No rash or lesion. MUSCULOSKELETAL: No joint pain or arthritis.   NEUROLOGIC: No tingling, numbness, weakness.  PSYCHIATRY: No anxiety or depression.   MEDICATIONS AT HOME:  Prior to Admission medications   Medication Sig Start Date End Date Taking? Authorizing Provider  albuterol (PROVENTIL HFA;VENTOLIN HFA) 108 (90 BASE) MCG/ACT inhaler Inhale 4-6 puffs by mouth every 4 hours as needed for wheezing, cough, and/or shortness of breath 07/01/15   Loleta Rose, MD  alendronate (FOSAMAX) 70 MG/75ML solution Take 70 mg by mouth every 7 (seven) days. Take with a full glass of water on an empty stomach.    [provider]  benzonatate (TESSALON) 100 MG capsule Take 100 mg by mouth 3 (three) times daily as needed for cough.    [provider]  benztropine (COGENTIN) 1 MG tablet Take 1 tablet (1 mg total) by mouth 2 (two) times daily. 07/03/15 03/31/17  Emily Filbert, MD  budesonide-formoterol (SYMBICORT) 80-4.5 MCG/ACT inhaler Inhale 2 puffs into the lungs 2 (two) times daily.    [provider]  busPIRone (BUSPAR) 15 MG tablet Take 5 mg by mouth 2 (two) times  daily.     [provider]  calcium carbonate (OSCAL) 1500 (600 Ca) MG TABS tablet Take 600 mg of elemental calcium by mouth 2 (two) times daily with a meal.    [provider]  carvedilol (COREG) 3.125 MG tablet Take 3.125 mg by mouth 2 (two) times daily with a meal.    [provider]  cetirizine (ZYRTEC) 10 MG tablet Take 10 mg by mouth daily.    [provider]  Cholecalciferol (VITAMIN D3) 5000 units CAPS Take 1 capsule by mouth daily.    [provider]  fluticasone (FLONASE) 50 MCG/ACT nasal spray Place 1 spray into  both nostrils daily.     [provider]  Levothyroxine Sodium 100 MCG CAPS Take 100 mcg by mouth daily before breakfast.     [provider]  lithium carbonate 300 MG capsule Take 300 mg by mouth 2 (two) times daily with a meal.    [provider]  nystatin (NYSTATIN) powder Apply topically 2 (two) times daily.    [provider]  Omega-3 Fatty Acids (FISH OIL) 1000 MG CAPS Take 1 capsule by mouth 2 (two) times daily.    [provider]  risperiDONE (RISPERDAL) 1 MG tablet Take 1 mg by mouth 2 (two) times daily.     [provider]  tamsulosin (FLOMAX) 0.4 MG CAPS capsule Take 1 capsule (0.4 mg total) by mouth daily. 03/25/17   McGowan, Carollee Herter A, PA-C      PHYSICAL EXAMINATION:   VITAL SIGNS: Blood pressure (!) 107/54, pulse (!) 57, temperature 98.9 F (37.2 C), temperature source Oral, resp. rate 18, height  (1.575 m), weight 72.6 kg (160 lb), SpO2 100 %.  GENERAL:  56 y.o.-year-old patient lying in the bed with no acute distress.  EYES: Pupils equal, round, reactive to light and accommodation. No scleral icterus. Extraocular muscles intact.  HEENT: Head atraumatic, normocephalic. Oropharynx and nasopharynx clear.  NECK:  Supple, no jugular venous distention. No thyroid enlargement, no tenderness.  LUNGS: Normal breath sounds bilaterally, no wheezing, rales,rhonchi or crepitation. No use of accessory muscles of respiration.  CARDIOVASCULAR: S1, S2 normal. No murmurs, rubs, or gallops.  ABDOMEN: Soft, nontender, nondistended. Bowel sounds present. No organomegaly or mass.  EXTREMITIES: No pedal edema, cyanosis, or clubbing.  NEUROLOGIC: Cranial nerves II through XII are intact. Muscle strength 5/5 in all extremities. Sensation intact. Gait not checked.  PSYCHIATRIC: The patient is alert and oriented x 3.  SKIN: No obvious rash, lesion, or ulcer.   LABORATORY PANEL:   CBC  Recent Labs Lab 06/23/17 1427  WBC 11.2*  HGB 13.0   HCT 40.3  PLT 108*  MCV 89.7  MCH 29.0  MCHC 32.3  RDW 17.3*   ------------------------------------------------------------------------------------------------------------------  Chemistries   Recent Labs Lab 06/23/17 1427  NA 126*  K 4.9  CL 98*  CO2 20*  GLUCOSE 92  BUN 18  CREATININE 0.95  CALCIUM 9.3   ------------------------------------------------------------------------------------------------------------------ estimated creatinine clearance is 61.7 mL/min (by C-G formula based on SCr of 0.95 mg/dL). ------------------------------------------------------------------------------------------------------------------ No results for input(s): TSH, T4TOTAL, T3FREE, THYROIDAB in the last 72 hours.  Invalid input(s): FREET3   Coagulation profile No results for input(s): INR, PROTIME in the last 168 hours. ------------------------------------------------------------------------------------------------------------------- No results for input(s): DDIMER in the last 72 hours. -------------------------------------------------------------------------------------------------------------------  Cardiac Enzymes No results for input(s): CKMB, TROPONINI, MYOGLOBIN in the last 168 hours.  Invalid input(s): CK ------------------------------------------------------------------------------------------------------------------ Invalid input(s): POCBNP  ---------------------------------------------------------------------------------------------------------------  Urinalysis    Component  Value Date/Time   COLORURINE COLORLESS (A) 06/23/2017 1427   APPEARANCEUR CLEAR (A) 06/23/2017 1427   LABSPEC 1.003 (L) 06/23/2017 1427   PHURINE 6.0 06/23/2017 1427   GLUCOSEU NEGATIVE 06/23/2017 1427   HGBUR NEGATIVE 06/23/2017 1427   BILIRUBINUR NEGATIVE 06/23/2017 1427   KETONESUR NEGATIVE 06/23/2017 1427   PROTEINUR NEGATIVE 06/23/2017 1427   NITRITE NEGATIVE 06/23/2017 1427    LEUKOCYTESUR NEGATIVE 06/23/2017 1427     RADIOLOGY: Dg Chest 2 View  Result Date: 06/23/2017 CLINICAL DATA:  Sudden onset dizziness resulting in a syncopal episode and fall today. Ex-smoker. EXAM: CHEST  2 VIEW COMPARISON:  04/14/2017, 03/15/2017 and 07/01/2015. FINDINGS: The cardiac silhouette remains borderline enlarged. Interval minimal left pleural effusion and minimal left basilar linear atelectasis. The lungs remain mildly hyperexpanded with mild diffuse peribronchial thickening and accentuation of the interstitial markings. No fracture or pneumothorax seen. There are multiple old, healed left rib fractures. Mild scoliosis. Aortic arch calcifications. Upper abdominal surgical clips. IMPRESSION: 1. Minimal left pleural fluid and minimal left basilar atelectasis. 2. Stable borderline cardiomegaly and mild changes of COPD and chronic bronchitis. Electronically Signed   By: Beckie Salts M.D.   On: 06/23/2017 17:23   Dg Thoracic Spine 2 View  Result Date: 06/23/2017 CLINICAL DATA:  Back pain following a fall during a syncopal episode today. EXAM: THORACIC SPINE 2 VIEWS COMPARISON:  Chest radiographs obtained today, 04/14/2017, 03/15/2017 and 07/01/2015. FINDINGS: Mild scoliosis. Minimal anterior spur formation at multiple levels. No fractures or subluxations. Bilateral upper abdominal surgical clips. Atheromatous arterial calcifications. IMPRESSION: 1. No fracture or subluxation. 2. Mild scoliosis and minimal degenerative changes. Electronically Signed   By: Beckie Salts M.D.   On: 06/23/2017 17:18   Dg Lumbar Spine Complete  Result Date: 06/23/2017 CLINICAL DATA:  Back pain following a fall during a syncopal episode today. EXAM: LUMBAR SPINE - COMPLETE 4+ VIEW COMPARISON:  12/06/2014. FINDINGS: Five non-rib-bearing lumbar vertebrae and transitional thoracolumbar vertebra. Minimal anterior spur formation at the L3-4 and L4-5 levels. No fractures, pars defects or subluxations. Atheromatous arterial  calcifications. Abdominal surgical clips and staples. IMPRESSION: 1. No fracture or subluxation. 2. Minimal degenerative changes. 3. Extensive atheromatous arterial calcifications. Electronically Signed   By: Beckie Salts M.D.   On: 06/23/2017 17:20    EKG: Orders placed or performed during the hospital encounter of 06/23/17  . ED EKG  . ED EKG    IMPRESSION AND PLAN:  * Syncopal episode   Tele, Echo    Consult cardio   UA negative.   May have seizures    Neuro consult  * Hyponatremia   Saline IV, Check TSH.  * bipolar    On lithium, check level.  * COPD   Cont inhalers    * Hypothyroidism    A sabove check TSH, Cont levothyroxine  All the records are reviewed and case discussed with ED provider. Management plans discussed with the patient, family and they are in agreement.  CODE STATUS: Full. Code Status History    Date Active Date Inactive Code Status Order ID Comments User Context   03/15/2017  5:45 PM 03/17/2017  8:24 PM Full Code [id number]  Altamese Dilling, MD Inpatient       TOTAL TIME TAKING CARE OF THIS PATIENT: 45 minutes.    Altamese Dilling M.D on 06/23/2017   Between 7am to 6pm - Pager - [phone]  After 6pm go to [email or web address] - Social research officer, government  [facility]  Office  [phone]  CC: Primary  care physician; Armando Gang, FNP   Note: This dictation was prepared with Dragon dictation along with smaller phrase technology. Any transcriptional errors that result from this process are unintentional.

## 2017-06-23 NOTE — Progress Notes (Signed)
Pt has orders for enteric precautions and to collect a stool to test for c.dff, however when speaking with the pt she states she has not had a stool in 3 days, and that her diabetes medication is what makes her have loose stools. However looking through her chart there is no diabetes medications listed and no history of diabetes. MD paged and explained to Dr. Anne Hahn, Dr. Anne Hahn to D/C enteric precautions at the moment but if pt does have a loose stool call back and we can reorders the enteric precautions and collect the stool.

## 2017-06-23 NOTE — ED Notes (Signed)
Patient transported to room 247.

## 2017-06-23 NOTE — ED Notes (Signed)
Pt given meal tray.

## 2017-06-23 NOTE — ED Notes (Signed)
[facility] - ask for Maurine Minister or [place]   [phone]

## 2017-06-24 ENCOUNTER — Observation Stay (HOSPITAL_COMMUNITY): Payer: Medicare Other

## 2017-06-24 ENCOUNTER — Observation Stay: Payer: Medicare Other

## 2017-06-24 ENCOUNTER — Observation Stay
Admit: 2017-06-24 | Discharge: 2017-06-24 | Disposition: A | Discharge status: Home / Self Care | Payer: Medicare Other | Attending: Cardiovascular Disease | Admitting: Cardiovascular Disease

## 2017-06-24 DIAGNOSIS — R55 Syncope and collapse: Secondary | ICD-10-CM

## 2017-06-24 LAB — CBC
HCT: 38.1 % (ref 35.0–47.0)
Hemoglobin: 12.3 g/dL (ref 12.0–16.0)
MCH: 28.4 pg (ref 26.0–34.0)
MCHC: 32.2 g/dL (ref 32.0–36.0)
MCV: 88.3 fL (ref 80.0–100.0)
Platelets: 165 10*3/uL (ref 150–440)
RBC: 4.32 MIL/uL (ref 3.80–5.20)
RDW: 17.3 % — ABNORMAL HIGH (ref 11.5–14.5)
WBC: 5.4 10*3/uL (ref 3.6–11.0)

## 2017-06-24 LAB — BASIC METABOLIC PANEL
Anion gap: 3 — ABNORMAL LOW (ref 5–15)
BUN: 16 mg/dL (ref 6–20)
CHLORIDE: 111 mmol/L (ref 101–111)
CO2: 25 mmol/L (ref 22–32)
Calcium: 8.9 mg/dL (ref 8.9–10.3)
Creatinine, Ser: 0.95 mg/dL (ref 0.44–1.00)
GFR calc non Af Amer: >60 mL/min (ref >60)
Glucose, Bld: 110 mg/dL — ABNORMAL HIGH (ref 65–99)
POTASSIUM: 5 mmol/L (ref 3.5–5.1)
SODIUM: 139 mmol/L (ref 135–145)

## 2017-06-24 LAB — TROPONIN I
Troponin I: <0.03 ng/mL (ref <0.03)
Troponin I: <0.03 ng/mL (ref <0.03)

## 2017-06-24 LAB — MRSA PCR SCREENING: MRSA by PCR: NEGATIVE

## 2017-06-24 LAB — LITHIUM LEVEL: LITHIUM LVL: 0.98 mmol/L (ref 0.60–1.20)

## 2017-06-24 MED ORDER — CALCIUM CARBONATE ANTACID 500 MG PO CHEW
3.0000 | CHEWABLE_TABLET | Freq: Two times a day (BID) | ORAL | Status: DC
  Administered 2017-06-24 – 2017-06-25 (×3): 600 mg via ORAL
  Filled 2017-06-24 (×3): qty 3

## 2017-06-24 NOTE — Plan of Care (Signed)
Problem: Education: Goal: Knowledge of [facility] Education information/materials will improve Outcome: Progressing Pt complained of back pain once, received tylenol with relief. Swabbed for MRSA, pcr negative. troponins have been negative, up to [facility] with 1 assist, unsteady gait and very impulsive when needing to go to the bathroom. IV fluids infusing

## 2017-06-24 NOTE — Care Management Obs Status (Signed)
MEDICARE OBSERVATION STATUS NOTIFICATION   Patient Details  Name: Tammy [name] MRN: [id number] Date of Birth: 29-Sep-1961   Medicare Observation Status Notification Given:  Yes Notice signed, one given to patient and the other to HIM for scanning    Eber Hong, RN 06/24/2017, 9:34 AM

## 2017-06-24 NOTE — Care Management (Addendum)
Patient placed in observation after becoming dizzy and falling.  She resides in a group home ? [facility]?  She has chronic oxygen through [employer] and denies home health services. Neurology and cardiology consults pending.  Sodium level at presentation 126 - which is now up to 139 with IVF. Referral to CSW.

## 2017-06-24 NOTE — Progress Notes (Signed)
Notified 2A consult should go to unassigned per [facility] schedule.

## 2017-06-24 NOTE — Progress Notes (Signed)
[facility] - [place] at [facility]   PATIENT NAME: Tammy [name]    MR#:  [id number]  DATE OF BIRTH:  1961/07/02  SUBJECTIVE:   Patient reports that she remembers passing out. Patient reports that prior to her passing out episode she has palpitations and feels dizzy. She is feeling fine this morning. She denies chest pain.  REVIEW OF SYSTEMS:    Review of Systems  Constitutional: Negative for fever, chills weight loss HENT: Negative for ear pain, nosebleeds, congestion, facial swelling, rhinorrhea, neck pain, neck stiffness and ear discharge.   Respiratory: Negative for cough, shortness of breath, wheezing  Cardiovascular: Negative for chest pain, palpitations and leg swelling.  Gastrointestinal: Negative for heartburn, abdominal pain, vomiting, diarrhea or consitpation Genitourinary: Negative for dysuria, urgency, frequency, hematuria Musculoskeletal: Negative for back pain or joint pain Neurological: Negative for dizziness, seizures, syncope, focal weakness,  numbness and headaches.  Hematological: Does not bruise/bleed easily.  Psychiatric/Behavioral: Negative for hallucinations, confusion, dysphoric mood History of anxiety   Tolerating Diet: yes      DRUG ALLERGIES:   Allergies  Allergen Reactions  . Penicillins Rash    Has patient had a PCN reaction causing immediate rash, facial/tongue/throat swelling, SOB or lightheadedness with hypotension: No Has patient had a PCN reaction causing severe rash involving mucus membranes or skin necrosis: No Has patient had a PCN reaction that required hospitalization: No Has patient had a PCN reaction occurring within the last 10 years: No If all of the above answers are "NO", then may proceed with Cephalosporin use.   . Prednisone Rash    VITALS:  Blood pressure 128/61, pulse 89, temperature (!) 97.5 F (36.4 C), temperature source Oral, resp. rate 17, height  (1.575 m), weight 78.7 kg (173 lb 6.4  oz), SpO2 93 %.  PHYSICAL EXAMINATION:  Constitutional: Appears well-developed and well-nourished. No distress. HENT: Normocephalic. Marland Kitchen Oropharynx is clear and moist.  Eyes: Conjunctivae and EOM are normal. PERRLA, no scleral icterus.  Neck: Normal ROM. Neck supple. No JVD. No tracheal deviation. CVS: RRR, S1/S2 +, no murmurs, no gallops, no carotid bruit.  Pulmonary: Effort and breath sounds normal, no stridor, rhonchi, wheezes, rales.  Abdominal: Soft. BS +,  no distension, tenderness, rebound or guarding.  Musculoskeletal: Normal range of motion. No edema and no tenderness.  Neuro: Alert. CN 2-12 grossly intact. No focal deficits. Skin: Skin is warm and dry. No rash noted. Psychiatric: Normal mood and affect.      LABORATORY PANEL:   CBC  Recent Labs Lab 06/24/17 0513  WBC 5.4  HGB 12.3  HCT 38.1  PLT 165   ------------------------------------------------------------------------------------------------------------------  Chemistries   Recent Labs Lab 06/23/17 2048 06/24/17 0513  NA  --  139  K  --  5.0  CL  --  111  CO2  --  25  GLUCOSE  --  110*  BUN  --  16  CREATININE  --  0.95  CALCIUM  --  8.9  AST 26  --   ALT 10*  --   ALKPHOS 105  --   BILITOT 0.6  --    ------------------------------------------------------------------------------------------------------------------  Cardiac Enzymes  Recent Labs Lab 06/23/17 2048 06/24/17 0034 06/24/17 0513  TROPONINI <0.03 <0.03 <0.03   ------------------------------------------------------------------------------------------------------------------  RADIOLOGY:  Dg Chest 2 View  Result Date: 06/23/2017 CLINICAL DATA:  Sudden onset dizziness resulting in a syncopal episode and fall today. Ex-smoker. EXAM: CHEST  2 VIEW COMPARISON:  04/14/2017, 03/15/2017 and 07/01/2015. FINDINGS: The cardiac silhouette  remains borderline enlarged. Interval minimal left pleural effusion and minimal left basilar linear  atelectasis. The lungs remain mildly hyperexpanded with mild diffuse peribronchial thickening and accentuation of the interstitial markings. No fracture or pneumothorax seen. There are multiple old, healed left rib fractures. Mild scoliosis. Aortic arch calcifications. Upper abdominal surgical clips. IMPRESSION: 1. Minimal left pleural fluid and minimal left basilar atelectasis. 2. Stable borderline cardiomegaly and mild changes of COPD and chronic bronchitis. Electronically Signed   By: Beckie Salts M.D.   On: 06/23/2017 17:23   Dg Thoracic Spine 2 View  Result Date: 06/23/2017 CLINICAL DATA:  Back pain following a fall during a syncopal episode today. EXAM: THORACIC SPINE 2 VIEWS COMPARISON:  Chest radiographs obtained today, 04/14/2017, 03/15/2017 and 07/01/2015. FINDINGS: Mild scoliosis. Minimal anterior spur formation at multiple levels. No fractures or subluxations. Bilateral upper abdominal surgical clips. Atheromatous arterial calcifications. IMPRESSION: 1. No fracture or subluxation. 2. Mild scoliosis and minimal degenerative changes. Electronically Signed   By: Beckie Salts M.D.   On: 06/23/2017 17:18   Dg Lumbar Spine Complete  Result Date: 06/23/2017 CLINICAL DATA:  Back pain following a fall during a syncopal episode today. EXAM: LUMBAR SPINE - COMPLETE 4+ VIEW COMPARISON:  12/06/2014. FINDINGS: Five non-rib-bearing lumbar vertebrae and transitional thoracolumbar vertebra. Minimal anterior spur formation at the L3-4 and L4-5 levels. No fractures, pars defects or subluxations. Atheromatous arterial calcifications. Abdominal surgical clips and staples. IMPRESSION: 1. No fracture or subluxation. 2. Minimal degenerative changes. 3. Extensive atheromatous arterial calcifications. Electronically Signed   By: Beckie Salts M.D.   On: 06/23/2017 17:20     ASSESSMENT AND PLAN:   56 year old female with history of seizure disorder who presents with syncope.  1. Syncope: Patient is being ruled out  for neurological first cardiac etiology. It does appear the patient may have been dehydrated which may have caused syncope as well.  She has ruled out for ACS. Telemetry shows no arrhythmia. I will order EEG to evaluate for seizure. I will await consultations by cardiology and neurology. Follow up on echocardiogram  2. Hyponatremia: This is improved with IV fluids. Discontinue IV fluids for now.   3. Essential hypertension: Continue Coreg  4. Hypothyroid: Continue Synthroid  5. Anxiety/bipolar: Continue BuSpar,Risperdal and lithium  6. Seizure disorder: She is currently not on any seizure medications. Await neurology consultation  7. COPD with chronic respiratory failure: No signs of exacerbation  Management plans discussed with the patient and she is in agreement.  CODE STATUS: full  TOTAL TIME TAKING CARE OF THIS PATIENT: 30 minutes.     POSSIBLE D/C tomorrow, DEPENDING ON CLINICAL CONDITION.   Tate Jerkins M.D on 06/24/2017 at 11:02 AM  Between 7am to 6pm - Pager - [phone] After 6pm go to [email or web address] - password [employer]  [facility] [place] [facility]  Office  [phone]  CC: Primary care physician; Armando Gang, FNP  Note: This dictation was prepared with Dragon dictation along with smaller phrase technology. Any transcriptional errors that result from this process are unintentional.

## 2017-06-24 NOTE — Consult Note (Signed)
Reason for Consult:Syncope Referring Physician: Mody  CC: Syncope  HPI: Tammy [name] is an 57 y.o. female with a history of seizure-like events in the past that have occurred at a frequency of about once a year.  It appears that the last was in November of last year.  Patient on no anticonvulsant therapy.  Reportedly the patient had some lightheadedness and palpitations and then a syncopal episode.  Per EDP the patient had no tonic/clonic activity or incontinence.  The patient reports that she remembers the entire event and that she remembers passing out and that she had left sided jerking.    Past Medical History:  Diagnosis Date  . Anxiety   . Asthma   . Bipolar 1 disorder (HCC)   . CHF (congestive heart failure) (HCC)   . COPD (chronic obstructive pulmonary disease) (HCC)   . Dementia   . Epilepsy (HCC)   . Seizures (HCC)     Past Surgical History:  Procedure Laterality Date  . APPENDECTOMY    . hernia reapir     x 3      Family History  Problem Relation Age of Onset  . Hypertension Mother   . Kidney cancer Neg Hx   . Bladder Cancer Neg Hx     Social History:  reports that she quit smoking about 18 years ago. She has never used smokeless tobacco. She reports that she does not drink alcohol or use drugs.  Allergies  Allergen Reactions  . Penicillins Rash    Has patient had a PCN reaction causing immediate rash, facial/tongue/throat swelling, SOB or lightheadedness with hypotension: No Has patient had a PCN reaction causing severe rash involving mucus membranes or skin necrosis: No Has patient had a PCN reaction that required hospitalization: No Has patient had a PCN reaction occurring within the last 10 years: No If all of the above answers are "NO", then may proceed with Cephalosporin use.   . Prednisone Rash    Medications:  I have reviewed the patient's current medications. Prior to Admission:  Prescriptions Prior to Admission  Medication Sig Dispense  Refill Last Dose  . albuterol (PROVENTIL HFA;VENTOLIN HFA) 108 (90 BASE) MCG/ACT inhaler Inhale 4-6 puffs by mouth every 4 hours as needed for wheezing, cough, and/or shortness of breath 1 Inhaler 1 PRN at PRN  . alendronate (FOSAMAX) 70 MG/75ML solution Take 70 mg by mouth every 7 (seven) days. Take with a full glass of water on an empty stomach.   06/22/2017 at 0800  . benzonatate (TESSALON) 100 MG capsule Take 100 mg by mouth 3 (three) times daily as needed for cough.   PRN at PRN  . benztropine (COGENTIN) 1 MG tablet Take 1 tablet (1 mg total) by mouth 2 (two) times daily. 60 tablet 2 06/22/2017 at 2000  . budesonide-formoterol (SYMBICORT) 80-4.5 MCG/ACT inhaler Inhale 2 puffs into the lungs 2 (two) times daily.   PRN at PRN  . busPIRone (BUSPAR) 5 MG tablet Take 5 mg by mouth 2 (two) times daily.    06/22/2017 at 0800  . calcium carbonate (OSCAL) 1500 (600 Ca) MG TABS tablet Take 600 mg of elemental calcium by mouth 2 (two) times daily with a meal.   06/22/2017 at 0800  . carvedilol (COREG) 3.125 MG tablet Take 3.125 mg by mouth 2 (two) times daily with a meal.   06/22/2017 at 0800  . cetirizine (ZYRTEC) 10 MG tablet Take 10 mg by mouth daily.   06/22/2017 at 0800  . Cholecalciferol (  VITAMIN D3) 5000 units CAPS Take 1 capsule by mouth daily.   06/22/2017 at 0800  . fluticasone (FLONASE) 50 MCG/ACT nasal spray Place 1 spray into both nostrils daily.    06/22/2017 at 0800  . Levothyroxine Sodium 100 MCG CAPS Take 100 mcg by mouth daily before breakfast.    06/22/2017 at 0800  . lithium carbonate 300 MG capsule Take 300 mg by mouth 2 (two) times daily with a meal.   06/22/2017 at 0800  . nystatin (NYSTATIN) powder Apply topically 2 (two) times daily.   PRN at PRN  . Omega-3 Fatty Acids (FISH OIL) 1000 MG CAPS Take 1 capsule by mouth 2 (two) times daily.   06/22/2017 at 0800  . risperiDONE (RISPERDAL) 1 MG tablet Take 1 mg by mouth 2 (two) times daily.    06/22/2017 at 0800  . tamsulosin (FLOMAX) 0.4 MG CAPS  capsule Take 1 capsule (0.4 mg total) by mouth daily. 90 capsule 3 06/22/2017 at 2000   Scheduled: . busPIRone  5 mg Oral BID  . calcium carbonate  3 tablet Oral BID WC  . carvedilol  3.125 mg Oral BID WC  . fluticasone  1 spray Each Nare Daily  . heparin  5,000 Units Subcutaneous Q8H  . Influenza vac split quadrivalent PF  0.5 mL Intramuscular Tomorrow-1000  . levothyroxine  100 mcg Oral QAC breakfast  . lithium carbonate  300 mg Oral BID WC  . loratadine  10 mg Oral Daily  . mouth rinse  15 mL Mouth Rinse BID  . mometasone-formoterol  2 puff Inhalation BID  . risperiDONE  1 mg Oral BID  . tamsulosin  0.4 mg Oral Daily    ROS: History obtained from the patient  General ROS: negative for - chills, fatigue, fever, night sweats, weight gain or weight loss Psychological ROS: negative for - behavioral disorder, hallucinations, memory difficulties, mood swings or suicidal ideation Ophthalmic ROS: negative for - blurry vision, double vision, eye pain or loss of vision ENT ROS: negative for - epistaxis, nasal discharge, oral lesions, sore throat, tinnitus  Allergy and Immunology ROS: negative for - hives or itchy/watery eyes Hematological and Lymphatic ROS: negative for - bleeding problems, bruising or swollen lymph nodes Endocrine ROS: negative for - galactorrhea, hair pattern changes, polydipsia/polyuria or temperature intolerance Respiratory ROS: negative for - cough, hemoptysis, shortness of breath or wheezing Cardiovascular ROS: negative for - chest pain, dyspnea on exertion, edema or irregular heartbeat Gastrointestinal ROS: loose stools for the past 1-2 weeks Genito-Urinary ROS: negative for - dysuria, hematuria, incontinence or urinary frequency/urgency Musculoskeletal ROS: chronic back pain Neurological ROS: as noted in HPI Dermatological ROS: negative for rash and skin lesion changes  Physical Examination: Blood pressure 128/61, pulse 89, temperature (!) 97.5 F (36.4 C),  temperature source Oral, resp. rate 17, height  (1.575 m), weight 78.7 kg (173 lb 6.4 oz), SpO2 93 %.  HEENT-  Normocephalic, no lesions, without obvious abnormality.  Normal external eye and conjunctiva.  Normal TM's bilaterally.  Normal auditory canals and external ears. Normal external nose, mucus membranes and septum.  Normal pharynx. Cardiovascular- S1, S2 normal, pulses palpable throughout   Lungs- chest clear, no wheezing, rales, normal symmetric air entry Abdomen- soft, non-tender; bowel sounds normal; no masses,  no organomegaly Extremities- no edema Lymph-no adenopathy palpable Musculoskeletal-no joint tenderness, deformity or swelling Skin-warm and dry, no hyperpigmentation, vitiligo, or suspicious lesions  Neurological Examination   Mental Status: Alert, oriented, thought content appropriate.  Speech fluent without evidence  of aphasia.  Able to follow 3 step commands without difficulty. Cranial Nerves: II: Discs flat bilaterally; Visual fields grossly normal, pupils equal, round, reactive to light and accommodation III,IV, VI: ptosis not present, extra-ocular motions intact bilaterally V,VII: decrease in the right NLF, facial light touch sensation normal bilaterally VIII: hearing normal bilaterally IX,X: gag reflex present XI: bilateral shoulder shrug XII: midline tongue extension Motor: Right : Upper extremity   5/5    Left:     Upper extremity   5/5  Lower extremity   5/5     Lower extremity   5/5 Coarse myoclonic-like tremor in the upper extremities bilaterally Sensory: Pinprick and light touch intact throughout, bilaterally Deep Tendon Reflexes: 2+ and symmetric throughout Plantars: Right: mute   Left: mute Cerebellar: Normal finger-to-nose and normal heel-to-shin testing bilaterally Gait: not tested due to safety concerns    Laboratory Studies:   Basic Metabolic Panel:  Recent Labs Lab 06/23/17 1427 06/24/17 0513  NA 126* 139  K 4.9 5.0  CL 98* 111   CO2 20* 25  GLUCOSE 92 110*  BUN 18 16  CREATININE 0.95 0.95  CALCIUM 9.3 8.9    Liver Function Tests:  Recent Labs Lab 06/23/17 2048  AST 26  ALT 10*  ALKPHOS 105  BILITOT 0.6  PROT 5.6*  ALBUMIN 3.2*   No results for input(s): LIPASE, AMYLASE in the last 168 hours. No results for input(s): AMMONIA in the last 168 hours.  CBC:  Recent Labs Lab 06/23/17 1427 06/24/17 0513  WBC 11.2* 5.4  HGB 13.0 12.3  HCT 40.3 38.1  MCV 89.7 88.3  PLT 108* 165    Cardiac Enzymes:  Recent Labs Lab 06/23/17 2048 06/24/17 0034 06/24/17 0513  TROPONINI <0.03 <0.03 <0.03    BNP: Invalid input(s): POCBNP  CBG: No results for input(s): GLUCAP in the last 168 hours.  Microbiology: Results for orders placed or performed during the hospital encounter of 06/23/17  MRSA PCR Screening     Status: None   Collection Time: 06/24/17  2:51 AM  Result Value Ref Range Status   MRSA by PCR NEGATIVE NEGATIVE Final    Comment:        The GeneXpert MRSA Assay (FDA approved for NASAL specimens only), is one component of a comprehensive MRSA colonization surveillance program. It is not intended to diagnose MRSA infection nor to guide or monitor treatment for MRSA infections.     Coagulation Studies: No results for input(s): LABPROT, INR in the last 72 hours.  Urinalysis:  Recent Labs Lab 06/23/17 1427  COLORURINE COLORLESS*  LABSPEC 1.003*  PHURINE 6.0  GLUCOSEU NEGATIVE  HGBUR NEGATIVE  BILIRUBINUR NEGATIVE  KETONESUR NEGATIVE  PROTEINUR NEGATIVE  NITRITE NEGATIVE  LEUKOCYTESUR NEGATIVE    Lipid Panel:  No results found for: CHOL, TRIG, HDL, CHOLHDL, VLDL, LDLCALC  HgbA1C: No results found for: HGBA1C  Urine Drug Screen:  No results found for: LABOPIA, COCAINSCRNUR, LABBENZ, AMPHETMU, THCU, LABBARB  Alcohol Level: No results for input(s): ETH in the last 168 hours.  Other results: EKG: sinus bradycardia at 57 bpm.  Imaging: Dg Chest 2 View  Result Date:  06/23/2017 CLINICAL DATA:  Sudden onset dizziness resulting in a syncopal episode and fall today. Ex-smoker. EXAM: CHEST  2 VIEW COMPARISON:  04/14/2017, 03/15/2017 and 07/01/2015. FINDINGS: The cardiac silhouette remains borderline enlarged. Interval minimal left pleural effusion and minimal left basilar linear atelectasis. The lungs remain mildly hyperexpanded with mild diffuse peribronchial thickening and accentuation of the interstitial markings.  No fracture or pneumothorax seen. There are multiple old, healed left rib fractures. Mild scoliosis. Aortic arch calcifications. Upper abdominal surgical clips. IMPRESSION: 1. Minimal left pleural fluid and minimal left basilar atelectasis. 2. Stable borderline cardiomegaly and mild changes of COPD and chronic bronchitis. Electronically Signed   By: Beckie Salts M.D.   On: 06/23/2017 17:23   Dg Thoracic Spine 2 View  Result Date: 06/23/2017 CLINICAL DATA:  Back pain following a fall during a syncopal episode today. EXAM: THORACIC SPINE 2 VIEWS COMPARISON:  Chest radiographs obtained today, 04/14/2017, 03/15/2017 and 07/01/2015. FINDINGS: Mild scoliosis. Minimal anterior spur formation at multiple levels. No fractures or subluxations. Bilateral upper abdominal surgical clips. Atheromatous arterial calcifications. IMPRESSION: 1. No fracture or subluxation. 2. Mild scoliosis and minimal degenerative changes. Electronically Signed   By: Beckie Salts M.D.   On: 06/23/2017 17:18   Dg Lumbar Spine Complete  Result Date: 06/23/2017 CLINICAL DATA:  Back pain following a fall during a syncopal episode today. EXAM: LUMBAR SPINE - COMPLETE 4+ VIEW COMPARISON:  12/06/2014. FINDINGS: Five non-rib-bearing lumbar vertebrae and transitional thoracolumbar vertebra. Minimal anterior spur formation at the L3-4 and L4-5 levels. No fractures, pars defects or subluxations. Atheromatous arterial calcifications. Abdominal surgical clips and staples. IMPRESSION: 1. No fracture or  subluxation. 2. Minimal degenerative changes. 3. Extensive atheromatous arterial calcifications. Electronically Signed   By: Beckie Salts M.D.   On: 06/23/2017 17:20     Assessment/Plan: 56 year old female presenting after a syncopal episode.  Per history appears to have had similar episodes in the past.  Per description this does not appear to be epileptic in origin.  Patient currently on no antiepileptic therapy.  Reports that tremor is longstanding and may be related to medications.  Hyponatremia may have contributed to presentation.    Recommendations: 1.  EEG.  Antiepileptic therapy not indicated unless EEG has evidence of epileptiform transients.   2.  Head CT without contrast 3.  Orthostatic BP and HR 3.  Agree with addressing hyponatremia   Thana Farr, MD Neurology [phone] 06/24/2017, 11:19 AM

## 2017-06-24 NOTE — Consult Note (Signed)
Tammy [name] is a 56 y.o. female  [id number]  Primary Cardiologist: Adrian Blackwater  Reason for Consultation: Syncope  HPI: 62 YOWF with h/o dementia, Bipolar disorder,and seizure disorderr apparently passed out. She also had intermittant chest pains.  Review of Systems: No orthopnea/PND   Past Medical History:  Diagnosis Date  . Anxiety   . Asthma   . Bipolar 1 disorder (HCC)   . CHF (congestive heart failure) (HCC)   . COPD (chronic obstructive pulmonary disease) (HCC)   . Dementia   . Epilepsy (HCC)   . Seizures (HCC)     Medications Prior to Admission  Medication Sig Dispense Refill  . albuterol (PROVENTIL HFA;VENTOLIN HFA) 108 (90 BASE) MCG/ACT inhaler Inhale 4-6 puffs by mouth every 4 hours as needed for wheezing, cough, and/or shortness of breath 1 Inhaler 1  . alendronate (FOSAMAX) 70 MG/75ML solution Take 70 mg by mouth every 7 (seven) days. Take with a full glass of water on an empty stomach.    . benzonatate (TESSALON) 100 MG capsule Take 100 mg by mouth 3 (three) times daily as needed for cough.    . benztropine (COGENTIN) 1 MG tablet Take 1 tablet (1 mg total) by mouth 2 (two) times daily. 60 tablet 2  . budesonide-formoterol (SYMBICORT) 80-4.5 MCG/ACT inhaler Inhale 2 puffs into the lungs 2 (two) times daily.    . busPIRone (BUSPAR) 5 MG tablet Take 5 mg by mouth 2 (two) times daily.     . calcium carbonate (OSCAL) 1500 (600 Ca) MG TABS tablet Take 600 mg of elemental calcium by mouth 2 (two) times daily with a meal.    . carvedilol (COREG) 3.125 MG tablet Take 3.125 mg by mouth 2 (two) times daily with a meal.    . cetirizine (ZYRTEC) 10 MG tablet Take 10 mg by mouth daily.    . Cholecalciferol (VITAMIN D3) 5000 units CAPS Take 1 capsule by mouth daily.    . fluticasone (FLONASE) 50 MCG/ACT nasal spray Place 1 spray into both nostrils daily.     . Levothyroxine Sodium 100 MCG CAPS Take 100 mcg by mouth daily before breakfast.     . lithium carbonate 300 MG  capsule Take 300 mg by mouth 2 (two) times daily with a meal.    . nystatin (NYSTATIN) powder Apply topically 2 (two) times daily.    . Omega-3 Fatty Acids (FISH OIL) 1000 MG CAPS Take 1 capsule by mouth 2 (two) times daily.    . risperiDONE (RISPERDAL) 1 MG tablet Take 1 mg by mouth 2 (two) times daily.     . tamsulosin (FLOMAX) 0.4 MG CAPS capsule Take 1 capsule (0.4 mg total) by mouth daily. 90 capsule 3     . busPIRone  5 mg Oral BID  . calcium carbonate  3 tablet Oral BID WC  . carvedilol  3.125 mg Oral BID WC  . fluticasone  1 spray Each Nare Daily  . heparin  5,000 Units Subcutaneous Q8H  . levothyroxine  100 mcg Oral QAC breakfast  . lithium carbonate  300 mg Oral BID WC  . loratadine  10 mg Oral Daily  . mouth rinse  15 mL Mouth Rinse BID  . mometasone-formoterol  2 puff Inhalation BID  . risperiDONE  1 mg Oral BID  . tamsulosin  0.4 mg Oral Daily    Infusions:   Allergies  Allergen Reactions  . Penicillins Rash    Has patient had a PCN reaction causing immediate  rash, facial/tongue/throat swelling, SOB or lightheadedness with hypotension: No Has patient had a PCN reaction causing severe rash involving mucus membranes or skin necrosis: No Has patient had a PCN reaction that required hospitalization: No Has patient had a PCN reaction occurring within the last 10 years: No If all of the above answers are "NO", then may proceed with Cephalosporin use.   . Prednisone Rash    Social History   Social History  . Marital status: Widowed    Spouse name: N/A  . Number of children: N/A  . Years of education: N/A   Occupational History  . Not on file.   Social History Main Topics  . Smoking status: Former Smoker    Quit date: 10/07/1998  . Smokeless tobacco: Never Used  . Alcohol use No  . Drug use: No  . Sexual activity: No   Other Topics Concern  . Not on file   Social History Narrative  . No narrative on file    Family History  Problem Relation Age of  Onset  . Hypertension Mother   . Kidney cancer Neg Hx   . Bladder Cancer Neg Hx     PHYSICAL EXAM: Vitals:   06/24/17 0831 06/24/17 1136  BP: 128/61 133/67  Pulse: 89 70  Resp:  18  Temp: (!) 97.5 F (36.4 C) 99 F (37.2 C)  SpO2: 93% 97%     Intake/Output Summary (Last 24 hours) at 06/24/17 1149 Last data filed at 06/24/17 1036  Gross per 24 hour  Intake           726.25 ml  Output             4100 ml  Net         -3373.75 ml    General:  Well appearing. No respiratory difficulty HEENT: normal Neck: supple. no JVD. Carotids 2+ bilat; no bruits. No lymphadenopathy or thryomegaly appreciated. Cor: PMI nondisplaced. Regular rate & rhythm. No rubs, gallops or murmurs. Lungs: clear Abdomen: soft, nontender, nondistended. No hepatosplenomegaly. No bruits or masses. Good bowel sounds. Extremities: no cyanosis, clubbing, rash, edema Neuro: alert & oriented x 3, cranial nerves grossly intact. moves all 4 extremities w/o difficulty. Affect pleasant.  ECG: NSR non-specific st and t changes  Results for orders placed or performed during the hospital encounter of 06/23/17 (from the past 24 hour(s))  Basic metabolic panel     Status: Abnormal   Collection Time: 06/23/17  2:27 PM  Result Value Ref Range   Sodium 126 (L) 135 - 145 mmol/L   Potassium 4.9 3.5 - 5.1 mmol/L   Chloride 98 (L) 101 - 111 mmol/L   CO2 20 (L) 22 - 32 mmol/L   Glucose, Bld 92 65 - 99 mg/dL   BUN 18 6 - 20 mg/dL   Creatinine, Ser 4.09 0.44 - 1.00 mg/dL   Calcium 9.3 8.9 - 81.1 mg/dL   GFR calc non Af Amer >60 >60 mL/min   GFR calc Af Amer >60 >60 mL/min   Anion gap 8 5 - 15  CBC     Status: Abnormal   Collection Time: 06/23/17  2:27 PM  Result Value Ref Range   WBC 11.2 (H) 3.6 - 11.0 K/uL   RBC 4.50 3.80 - 5.20 MIL/uL   Hemoglobin 13.0 12.0 - 16.0 g/dL   HCT 91.4 78.2 - 95.6 %   MCV 89.7 80.0 - 100.0 fL   MCH 29.0 26.0 - 34.0 pg   MCHC 32.3 32.0 -  36.0 g/dL   RDW 16.1 (H) 09.6 - 04.5 %    Platelets 108 (L) 150 - 440 K/uL  Urinalysis, Complete w Microscopic     Status: Abnormal   Collection Time: 06/23/17  2:27 PM  Result Value Ref Range   Color, Urine COLORLESS (A) YELLOW   APPearance CLEAR (A) CLEAR   Specific Gravity, Urine 1.003 (L) 1.005 - 1.030   pH 6.0 5.0 - 8.0   Glucose, UA NEGATIVE NEGATIVE mg/dL   Hgb urine dipstick NEGATIVE NEGATIVE   Bilirubin Urine NEGATIVE NEGATIVE   Ketones, ur NEGATIVE NEGATIVE mg/dL   Protein, ur NEGATIVE NEGATIVE mg/dL   Nitrite NEGATIVE NEGATIVE   Leukocytes, UA NEGATIVE NEGATIVE   RBC / HPF 0-5 0 - 5 RBC/hpf   WBC, UA 0-5 0 - 5 WBC/hpf   Bacteria, UA RARE (A) NONE SEEN   Squamous Epithelial / LPF 0-5 (A) NONE SEEN  Brain natriuretic peptide     Status: None   Collection Time: 06/23/17  6:45 PM  Result Value Ref Range   B Natriuretic Peptide 17.0 0.0 - 100.0 pg/mL  Hepatic function panel     Status: Abnormal   Collection Time: 06/23/17  8:48 PM  Result Value Ref Range   Total Protein 5.6 (L) 6.5 - 8.1 g/dL   Albumin 3.2 (L) 3.5 - 5.0 g/dL   AST 26 15 - 41 U/L   ALT 10 (L) 14 - 54 U/L   Alkaline Phosphatase 105 38 - 126 U/L   Total Bilirubin 0.6 0.3 - 1.2 mg/dL   Bilirubin, Direct 0.3 0.1 - 0.5 mg/dL   Indirect Bilirubin 0.3 0.3 - 0.9 mg/dL  Troponin I     Status: None   Collection Time: 06/23/17  8:48 PM  Result Value Ref Range   Troponin I <0.03 <0.03 ng/mL  TSH     Status: None   Collection Time: 06/23/17  8:48 PM  Result Value Ref Range   TSH 1.746 0.350 - 4.500 uIU/mL  Troponin I     Status: None   Collection Time: 06/24/17 12:34 AM  Result Value Ref Range   Troponin I <0.03 <0.03 ng/mL  MRSA PCR Screening     Status: None   Collection Time: 06/24/17  2:51 AM  Result Value Ref Range   MRSA by PCR NEGATIVE NEGATIVE  Lithium level     Status: None   Collection Time: 06/24/17  5:13 AM  Result Value Ref Range   Lithium Lvl 0.98 0.60 - 1.20 mmol/L  Troponin I     Status: None   Collection Time: 06/24/17  5:13 AM   Result Value Ref Range   Troponin I <0.03 <0.03 ng/mL  Basic metabolic panel     Status: Abnormal   Collection Time: 06/24/17  5:13 AM  Result Value Ref Range   Sodium 139 135 - 145 mmol/L   Potassium 5.0 3.5 - 5.1 mmol/L   Chloride 111 101 - 111 mmol/L   CO2 25 22 - 32 mmol/L   Glucose, Bld 110 (H) 65 - 99 mg/dL   BUN 16 6 - 20 mg/dL   Creatinine, Ser 4.09 0.44 - 1.00 mg/dL   Calcium 8.9 8.9 - 81.1 mg/dL   GFR calc non Af Amer >60 >60 mL/min   GFR calc Af Amer >60 >60 mL/min   Anion gap 3 (L) 5 - 15  CBC     Status: Abnormal   Collection Time: 06/24/17  5:13 AM  Result Value Ref  Range   WBC 5.4 3.6 - 11.0 K/uL   RBC 4.32 3.80 - 5.20 MIL/uL   Hemoglobin 12.3 12.0 - 16.0 g/dL   HCT 16.1 09.6 - 04.5 %   MCV 88.3 80.0 - 100.0 fL   MCH 28.4 26.0 - 34.0 pg   MCHC 32.2 32.0 - 36.0 g/dL   RDW 40.9 (H) 81.1 - 91.4 %   Platelets 165 150 - 440 K/uL   Dg Chest 2 View  Result Date: 06/23/2017 CLINICAL DATA:  Sudden onset dizziness resulting in a syncopal episode and fall today. Ex-smoker. EXAM: CHEST  2 VIEW COMPARISON:  04/14/2017, 03/15/2017 and 07/01/2015. FINDINGS: The cardiac silhouette remains borderline enlarged. Interval minimal left pleural effusion and minimal left basilar linear atelectasis. The lungs remain mildly hyperexpanded with mild diffuse peribronchial thickening and accentuation of the interstitial markings. No fracture or pneumothorax seen. There are multiple old, healed left rib fractures. Mild scoliosis. Aortic arch calcifications. Upper abdominal surgical clips. IMPRESSION: 1. Minimal left pleural fluid and minimal left basilar atelectasis. 2. Stable borderline cardiomegaly and mild changes of COPD and chronic bronchitis. Electronically Signed   By: Beckie Salts M.D.   On: 06/23/2017 17:23   Dg Thoracic Spine 2 View  Result Date: 06/23/2017 CLINICAL DATA:  Back pain following a fall during a syncopal episode today. EXAM: THORACIC SPINE 2 VIEWS COMPARISON:  Chest  radiographs obtained today, 04/14/2017, 03/15/2017 and 07/01/2015. FINDINGS: Mild scoliosis. Minimal anterior spur formation at multiple levels. No fractures or subluxations. Bilateral upper abdominal surgical clips. Atheromatous arterial calcifications. IMPRESSION: 1. No fracture or subluxation. 2. Mild scoliosis and minimal degenerative changes. Electronically Signed   By: Beckie Salts M.D.   On: 06/23/2017 17:18   Dg Lumbar Spine Complete  Result Date: 06/23/2017 CLINICAL DATA:  Back pain following a fall during a syncopal episode today. EXAM: LUMBAR SPINE - COMPLETE 4+ VIEW COMPARISON:  12/06/2014. FINDINGS: Five non-rib-bearing lumbar vertebrae and transitional thoracolumbar vertebra. Minimal anterior spur formation at the L3-4 and L4-5 levels. No fractures, pars defects or subluxations. Atheromatous arterial calcifications. Abdominal surgical clips and staples. IMPRESSION: 1. No fracture or subluxation. 2. Minimal degenerative changes. 3. Extensive atheromatous arterial calcifications. Electronically Signed   By: Beckie Salts M.D.   On: 06/23/2017 17:20     ASSESSMENT AND PLAN:Atypical chest pains associated with syncope, and unreearkable EKG, and r/o for MI. Advise echo, and if its evaluated by neuro and cleared can go home and f/u office for further testing.  Karle Desrosier A

## 2017-06-24 NOTE — Progress Notes (Signed)
*  PRELIMINARY RESULTS* Echocardiogram 2D Echocardiogram has been performed.  Cristela Blue 06/24/2017, 4:38 PM

## 2017-06-24 NOTE — Evaluation (Signed)
Physical Therapy Evaluation Patient Details Name: Tammy [name] MRN: [id number] DOB: 10-15-1960 Today's Date: 06/24/2017   History of Present Illness  presented to ER secondary to fall with LOC; admitted for syncopal work-up, possible seizure.  Head CT, t-spine and l-spine imaging negative for acute injury; EEG pending.  Clinical Impression  Upon evaluation, patient alert and oriented to self only; identifies place when provided with yes/no questions by therapist, but unable to generate answer from open-ended question.  Very impulsive, often attempting to stand without cuing/assist from therapist during session.  Bilat UE/LE strength and ROM grossly symmetrical and WFL; moderate tremors/jerking noted (UE > LEs) at rest and with activity.  Able to complete bed mobility with mod indep; sit/stand, basic transfers and gait (40') without assist device, cga/close sup.  Completes dynamic gait components (head turns, start/stop and change of direction) without buckling or LOB; constant cuing for safety awareness in environment. Vitals stable and WFL throughout session; no signs of orthostasis, no subjective reports of dizziness/lightheadedness with transition to upright. Would benefit from skilled PT to address above deficits and promote optimal return to PLOF; Recommend transition to HHPT upon discharge from acute hospitalization.     Follow Up Recommendations Home health PT    Equipment Recommendations       Recommendations for Other Services       Precautions / Restrictions Precautions Precautions: Fall Restrictions Weight Bearing Restrictions: No      Mobility  Bed Mobility Overal bed mobility: Independent             General bed mobility comments: returns to supine via 'crawling' into bed via quadruped  Transfers Overall transfer level: Needs assistance Equipment used: None Transfers: Sit to/from Stand Sit to Stand: Supervision         General transfer comment:  limited/no use of UEs for sit/stand movement transition  Ambulation/Gait Ambulation/Gait assistance: Min guard;Supervision Ambulation Distance (Feet): 40 Feet Assistive device: None       General Gait Details: reciprocal steppping pattern, fair step height/length, fair cadence; able to complete head turns, start/stop and change of direction without LOB.   Stairs            Wheelchair Mobility    Modified Rankin (Stroke Patients Only)       Balance Overall balance assessment: Needs assistance Sitting-balance support: No upper extremity supported;Feet supported Sitting balance-Leahy Scale: Good     Standing balance support: No upper extremity supported Standing balance-Leahy Scale: Fair                 High Level Balance Comments: additional balance testing deferred this session, as patient preferring to return to bed after gait trial             Pertinent Vitals/Pain Pain Assessment: No/denies pain    Home Living Family/patient expects to be discharged to:: Group home Living Arrangements: Group Home               Additional Comments: Per CSW, 24 sup/assist available in group home environment as needed    Prior Function Level of Independence: Independent with assistive device(s)         Comments: Mod indep with ADLs, household mobility per patient report; question reliability due to cognitive deficits.  Will verify with family/facility as available.     Hand Dominance        Extremity/Trunk Assessment   Upper Extremity Assessment Upper Extremity Assessment: Overall WFL for tasks assessed (persistent tremor, jerking noted throughout UEs (axterixis?) with  some degree of grasp reflex to R UE with tone testing)    Lower Extremity Assessment Lower Extremity Assessment: Overall WFL for tasks assessed (grossly at least 4-/5 throughout; difficult to test formally due to cognitive deficits)       Communication   Communication:  (question  intermittent word-finding difficulties at times?)  Cognition Arousal/Alertness: Awake/alert Behavior During Therapy: Impulsive Overall Cognitive Status: Difficult to assess                                 General Comments: oriented to self only; follows commands, though requires constant redirection to task      General Comments      Exercises     Assessment/Plan    PT Assessment Patient needs continued PT services  PT Problem List Decreased strength;Decreased activity tolerance;Decreased balance;Decreased mobility;Decreased cognition;Decreased knowledge of use of DME;Decreased safety awareness;Decreased knowledge of precautions       PT Treatment Interventions DME instruction;Gait training;Stair training;Functional mobility training;Therapeutic activities;Therapeutic exercise;Balance training;Patient/family education    PT Goals (Current goals can be found in the Care Plan section)  Acute Rehab PT Goals PT Goal Formulation: Patient unable to participate in goal setting Time For Goal Achievement: 07/08/17 Potential to Achieve Goals: Good    Frequency Min 2X/week   Barriers to discharge        Co-evaluation               AM-PAC PT "6 Clicks" Daily Activity  Outcome Measure Difficulty turning over in bed (including adjusting bedclothes, sheets and blankets)?: None Difficulty moving from lying on back to sitting on the side of the bed? : None Difficulty sitting down on and standing up from a chair with arms (e.g., wheelchair, bedside commode, etc,.)?: None Help needed moving to and from a bed to chair (including a wheelchair)?: A Little Help needed walking in hospital room?: A Little Help needed climbing 3-5 steps with a railing? : A Little 6 Click Score: 21    End of Session Equipment Utilized During Treatment: Gait belt;Oxygen Activity Tolerance: Patient tolerated treatment well Patient left: in bed;with call bell/phone within reach;with bed alarm  set Nurse Communication: Mobility status PT Visit Diagnosis: Difficulty in walking, not elsewhere classified (R26.2)    Time: 1610-9604 PT Time Calculation (min) (ACUTE ONLY): 20 min   Charges:   PT Evaluation $PT Eval Low Complexity: 1 Low     PT G Codes:   PT G-Codes **NOT FOR INPATIENT CLASS** Functional Assessment Tool Used: AM-PAC 6 Clicks Basic Mobility Functional Limitation: Mobility: Walking and moving around Mobility: Walking and Moving Around Current Status ([id number]): At least 20 percent but less than 40 percent impaired, limited or restricted Mobility: Walking and Moving Around Goal Status [phone]): At least 1 percent but less than 20 percent impaired, limited or restricted    Alika Saladin H. Manson Passey, PT, DPT, NCS 06/24/17, 3:59 PM [phone]

## 2017-06-25 DIAGNOSIS — R55 Syncope and collapse: Secondary | ICD-10-CM | POA: Diagnosis not present

## 2017-06-25 LAB — ECHOCARDIOGRAM COMPLETE
Height: 62 in
Weight: 2774.4 oz

## 2017-06-25 NOTE — Plan of Care (Signed)
Problem: Education: Goal: Knowledge of [facility] Education information/materials will improve Outcome: Progressing No complaints of pain, pt still very impulsive when having to go to the bathroom, pt can be incontinent at times. New IV placed.

## 2017-06-25 NOTE — Progress Notes (Signed)
SUBJECTIVE: Pt is feeling well. No chest pain reported. Shortness of breath is back to baseline with regard to chronic COPD.    Vitals:   06/24/17 1136 06/24/17 1942 06/25/17 0518 06/25/17 0839  BP: 133/67 132/68 (!) 114/51 108/65  Pulse: 70 71 64 82  Resp: Temp: 99 F (37.2 C) 98.5 F (36.9 C) 98.8 F (37.1 C) 98.7 F (37.1 C)  TempSrc: Oral Oral Oral Oral  SpO2: 97% 100% 98% 97%  Weight:      Height:        Intake/Output Summary (Last 24 hours) at 06/25/17 0855 Last data filed at 06/25/17 0816  Gross per 24 hour  Intake              600 ml  Output             3100 ml  Net            -2500 ml    LABS: Basic Metabolic Panel:  Recent Labs  47/82/95 1427 06/24/17 0513  NA 126* 139  K 4.9 5.0  CL 98* 111  CO2 20* 25  GLUCOSE 92 110*  BUN 18 16  CREATININE 0.95 0.95  CALCIUM 9.3 8.9   Liver Function Tests:  Recent Labs  06/23/17 2048  AST 26  ALT 10*  ALKPHOS 105  BILITOT 0.6  PROT 5.6*  ALBUMIN 3.2*   No results for input(s): LIPASE, AMYLASE in the last 72 hours. CBC:  Recent Labs  06/23/17 1427 06/24/17 0513  WBC 11.2* 5.4  HGB 13.0 12.3  HCT 40.3 38.1  MCV 89.7 88.3  PLT 108* 165   Cardiac Enzymes:  Recent Labs  06/23/17 2048 06/24/17 0034 06/24/17 0513  TROPONINI <0.03 <0.03 <0.03   BNP: Invalid input(s): POCBNP D-Dimer: No results for input(s): DDIMER in the last 72 hours. Hemoglobin A1C: No results for input(s): HGBA1C in the last 72 hours. Fasting Lipid Panel: No results for input(s): CHOL, HDL, LDLCALC, TRIG, CHOLHDL, LDLDIRECT in the last 72 hours. Thyroid Function Tests:  Recent Labs  06/23/17 2048  TSH 1.746   Anemia Panel: No results for input(s): VITAMINB12, FOLATE, FERRITIN, TIBC, IRON, RETICCTPCT in the last 72 hours.   PHYSICAL EXAM General: Well developed, well nourished, in no acute distress HEENT:  Normocephalic and atramatic Neck:  No JVD.  Lungs: Clear bilaterally to auscultation and  percussion. Heart: HRRR . Normal S1 and S2 without gallops or murmurs.  Abdomen: Bowel sounds are positive, abdomen soft and non-tender  Msk:  Back normal, normal gait. Normal strength and tone for age. Extremities: No clubbing, cyanosis or edema.   Neuro: Alert and oriented X 3. Psych:  Good affect, responds appropriately  TELEMETRY: NSR 85bpm  ASSESSMENT AND PLAN: Status post syncopal episodes and ruled out for ACS with negative troponin and no acute changes on EKG. Echo results are pending. Blood pressure is low-normal, hyponatremia has improved, potassium stable. Dehydration may have caused syncopal symptoms. If no acute findings on echo, advise discharge with outpatient follow up at [facility] Friday 10 am with Dr. Adrian Blackwater.  Principal Problem:   Syncopal episodes    Caroleen Hamman, NP-C 06/25/2017 8:55 AM

## 2017-06-25 NOTE — Progress Notes (Signed)
Called to pt's room as caregiver from group home arrived to get her. Pt was sitting in chair with urine on her clothes. Caregiver stated that the pt was up and the chair alarm was going off when he came in. Assisted the pt with a bath and put her in paper scrubs. Wet clothing placed in a personal belongings bag and sent home with her. Earring and necklace noted to be missing per caregiver. Unable to located items in room. Housekeeping and management notified to be looking for them. Pt discharged to group home. Escorted out via wheelchair.

## 2017-06-25 NOTE — Clinical Social Work Note (Addendum)
CSW spoke to [facility] at patient's group home, and they can pick up patient today to bring her back home around [place].  CSW notified bedside nurse that facility will be picking patient up today.  CSW also notified patient's sister Sallye Ober to inform her that patient will be discharging back to group home today.  Ervin Knack. Wendle Kina, MSW, Theresia Majors [phone]  06/25/2017 9:44 AM

## 2017-06-25 NOTE — Discharge Summary (Signed)
[facility] [place] [facility]   PATIENT NAME: Tammy [name]    MR#:  [id number]  DATE OF BIRTH:  1960/10/30  DATE OF ADMISSION:  06/23/2017 ADMITTING PHYSICIAN: Altamese Dilling, MD  DATE OF DISCHARGE: 06/25/2017  PRIMARY CARE PHYSICIAN: Armando Gang, FNP    ADMISSION DIAGNOSIS:  Hyponatremia [E87.1] Bacteriuria [R82.71] Syncope, unspecified syncope type [R55] Acute midline thoracic back pain [M54.6] Diarrhea, unspecified type [R19.7]  DISCHARGE DIAGNOSIS:  Principal Problem:   Syncopal episodes   SECONDARY DIAGNOSIS:   Past Medical History:  Diagnosis Date  . Anxiety   . Asthma   . Bipolar 1 disorder (HCC)   . CHF (congestive heart failure) (HCC)   . COPD (chronic obstructive pulmonary disease) (HCC)   . Dementia   . Epilepsy (HCC)   . Seizures [facility])     HOSPITAL COURSE:  56 year old female with history of seizure disorder who presents with syncope.  1. Syncope: Patient has been ruled out for neurological and cardiac etiology. It does appear the patient may have been dehydrated which may have caused syncope. EEG and Headt CT were negative. She has ruled out for ACS. Telemetry shows no arrhythmia. Her ECHO (unofficial) looks to have No major abnormalities and no etiology of syncope. She will follow up with cardiology as an outpatient.   2. Hyponatremia: This has improved with IV fluids.   3. Essential hypertension: Continue Coreg  4. Hypothyroid: Continue Synthroid  5. Anxiety/bipolar: Continue BuSpar,Risperdal and lithium  6. Seizure disorder: She is currently not on any seizure medications. Her EEG showed no signs of seizure. Neurology did not recommend starting anticonvulsants.  7. COPD with chronic respiratory failure: No signs of exacerbation  Followed by Dr Nicholos Johns DISCHARGE CONDITIONS AND DIET:   Stable for discharge on heart healthy diet  CONSULTS OBTAINED:  Treatment Team:  Thana Farr,  MD Laurier Nancy, MD  DRUG ALLERGIES:   Allergies  Allergen Reactions  . Penicillins Rash    Has patient had a PCN reaction causing immediate rash, facial/tongue/throat swelling, SOB or lightheadedness with hypotension: No Has patient had a PCN reaction causing severe rash involving mucus membranes or skin necrosis: No Has patient had a PCN reaction that required hospitalization: No Has patient had a PCN reaction occurring within the last 10 years: No If all of the above answers are "NO", then may proceed with Cephalosporin use.   . Prednisone Rash    DISCHARGE MEDICATIONS:   Current Discharge Medication List    CONTINUE these medications which have NOT CHANGED   Details  albuterol (PROVENTIL HFA;VENTOLIN HFA) 108 (90 BASE) MCG/ACT inhaler Inhale 4-6 puffs by mouth every 4 hours as needed for wheezing, cough, and/or shortness of breath Qty: 1 Inhaler, Refills: 1    alendronate (FOSAMAX) 70 MG/75ML solution Take 70 mg by mouth every 7 (seven) days. Take with a full glass of water on an empty stomach.    benzonatate (TESSALON) 100 MG capsule Take 100 mg by mouth 3 (three) times daily as needed for cough.    benztropine (COGENTIN) 1 MG tablet Take 1 tablet (1 mg total) by mouth 2 (two) times daily. Qty: 60 tablet, Refills: 2    budesonide-formoterol (SYMBICORT) 80-4.5 MCG/ACT inhaler Inhale 2 puffs into the lungs 2 (two) times daily.    busPIRone (BUSPAR) 5 MG tablet Take 5 mg by mouth 2 (two) times daily.     calcium carbonate (OSCAL) 1500 (600 Ca) MG TABS tablet Take 600 mg of elemental calcium by  mouth 2 (two) times daily with a meal.    carvedilol (COREG) 3.125 MG tablet Take 3.125 mg by mouth 2 (two) times daily with a meal.    cetirizine (ZYRTEC) 10 MG tablet Take 10 mg by mouth daily.    Cholecalciferol (VITAMIN D3) 5000 units CAPS Take 1 capsule by mouth daily.    fluticasone (FLONASE) 50 MCG/ACT nasal spray Place 1 spray into both nostrils daily.      Levothyroxine Sodium 100 MCG CAPS Take 100 mcg by mouth daily before breakfast.     lithium carbonate 300 MG capsule Take 300 mg by mouth 2 (two) times daily with a meal.    nystatin (NYSTATIN) powder Apply topically 2 (two) times daily.    Omega-3 Fatty Acids (FISH OIL) 1000 MG CAPS Take 1 capsule by mouth 2 (two) times daily.    risperiDONE (RISPERDAL) 1 MG tablet Take 1 mg by mouth 2 (two) times daily.     tamsulosin (FLOMAX) 0.4 MG CAPS capsule Take 1 capsule (0.4 mg total) by mouth daily. Qty: 90 capsule, Refills: 3          Today   CHIEF COMPLAINT:  Patient wants to go home. No acute events overnight. No chest pain or shortness of breath. No seizure activity reported.   VITAL SIGNS:  Blood pressure 108/65, pulse 82, temperature 98.7 F (37.1 C), temperature source Oral, resp. rate 20, height  (1.575 m), weight 78.7 kg (173 lb 6.4 oz), SpO2 97 %.   REVIEW OF SYSTEMS:  Review of Systems  Constitutional: Negative.  Negative for chills, fever and malaise/fatigue.  HENT: Negative.  Negative for ear discharge, ear pain, hearing loss, nosebleeds and sore throat.   Eyes: Negative.  Negative for blurred vision and pain.  Respiratory: Negative.  Negative for cough, hemoptysis, shortness of breath and wheezing.   Cardiovascular: Negative.  Negative for chest pain, palpitations and leg swelling.  Gastrointestinal: Negative.  Negative for abdominal pain, blood in stool, diarrhea, nausea and vomiting.  Genitourinary: Negative.  Negative for dysuria.  Musculoskeletal: Negative.  Negative for back pain.  Skin: Negative.   Neurological: Negative for dizziness, tremors, speech change, focal weakness, seizures and headaches.  Endo/Heme/Allergies: Negative.  Does not bruise/bleed easily.  Psychiatric/Behavioral: Negative.  Negative for depression, hallucinations and suicidal ideas.     PHYSICAL EXAMINATION:  GENERAL:  56 y.o.-year-old patient lying in the bed with no acute  distress.  NECK:  Supple, no jugular venous distention. No thyroid enlargement, no tenderness.  LUNGS: Normal breath sounds bilaterally, no wheezing, rales,rhonchi  No use of accessory muscles of respiration.  CARDIOVASCULAR: S1, S2 normal. No murmurs, rubs, or gallops.  ABDOMEN: Soft, non-tender, non-distended. Bowel sounds present. No organomegaly or mass.  EXTREMITIES: No pedal edema, cyanosis, or clubbing.  PSYCHIATRIC: The patient is alert and oriented x 3.  SKIN: No obvious rash, lesion, or ulcer.   DATA REVIEW:   CBC  Recent Labs Lab 06/24/17 0513  WBC 5.4  HGB 12.3  HCT 38.1  PLT 165    Chemistries   Recent Labs Lab 06/23/17 2048 06/24/17 0513  NA  --  139  K  --  5.0  CL  --  111  CO2  --  25  GLUCOSE  --  110*  BUN  --  16  CREATININE  --  0.95  CALCIUM  --  8.9  AST 26  --   ALT 10*  --   ALKPHOS 105  --   BILITOT 0.6  --  Cardiac Enzymes  Recent Labs Lab 06/23/17 2048 06/24/17 0034 06/24/17 0513  TROPONINI <0.03 <0.03 <0.03    Microbiology Results  @  RADIOLOGY:  Dg Chest 2 View  Result Date: 06/23/2017 CLINICAL DATA:  Sudden onset dizziness resulting in a syncopal episode and fall today. Ex-smoker. EXAM: CHEST  2 VIEW COMPARISON:  04/14/2017, 03/15/2017 and 07/01/2015. FINDINGS: The cardiac silhouette remains borderline enlarged. Interval minimal left pleural effusion and minimal left basilar linear atelectasis. The lungs remain mildly hyperexpanded with mild diffuse peribronchial thickening and accentuation of the interstitial markings. No fracture or pneumothorax seen. There are multiple old, healed left rib fractures. Mild scoliosis. Aortic arch calcifications. Upper abdominal surgical clips. IMPRESSION: 1. Minimal left pleural fluid and minimal left basilar atelectasis. 2. Stable borderline cardiomegaly and mild changes of COPD and chronic bronchitis. Electronically Signed   By: Beckie Salts M.D.   On: 06/23/2017 17:23   Dg  Thoracic Spine 2 View  Result Date: 06/23/2017 CLINICAL DATA:  Back pain following a fall during a syncopal episode today. EXAM: THORACIC SPINE 2 VIEWS COMPARISON:  Chest radiographs obtained today, 04/14/2017, 03/15/2017 and 07/01/2015. FINDINGS: Mild scoliosis. Minimal anterior spur formation at multiple levels. No fractures or subluxations. Bilateral upper abdominal surgical clips. Atheromatous arterial calcifications. IMPRESSION: 1. No fracture or subluxation. 2. Mild scoliosis and minimal degenerative changes. Electronically Signed   By: Beckie Salts M.D.   On: 06/23/2017 17:18   Dg Lumbar Spine Complete  Result Date: 06/23/2017 CLINICAL DATA:  Back pain following a fall during a syncopal episode today. EXAM: LUMBAR SPINE - COMPLETE 4+ VIEW COMPARISON:  12/06/2014. FINDINGS: Five non-rib-bearing lumbar vertebrae and transitional thoracolumbar vertebra. Minimal anterior spur formation at the L3-4 and L4-5 levels. No fractures, pars defects or subluxations. Atheromatous arterial calcifications. Abdominal surgical clips and staples. IMPRESSION: 1. No fracture or subluxation. 2. Minimal degenerative changes. 3. Extensive atheromatous arterial calcifications. Electronically Signed   By: Beckie Salts M.D.   On: 06/23/2017 17:20   Ct Head Wo Contrast  Result Date: 06/24/2017 CLINICAL DATA:  Initial evaluation for recurrent syncope. EXAM: CT HEAD WITHOUT CONTRAST TECHNIQUE: Contiguous axial images were obtained from the base of the skull through the vertex without intravenous contrast. COMPARISON:  Prior CT from 08/22/2016. FINDINGS: Brain: Cerebral volume is stable, and remains within normal limits for age. No acute intracranial hemorrhage. No evidence for acute large vessel territory infarct. No mass lesion, midline shift or mass effect. No hydrocephalus. No extra-axial fluid collection. Vascular: No worrisome hyperdense vessel. Atherosclerotic change again noted within the carotid siphons. Skull: Scalp  soft tissues and calvarium within normal limits. Sinuses/Orbits: Visualized globes and orbital soft tissues are normal. Scattered mucosal thickening within the ethmoid air cells. Remainder the visualized paranasal sinuses otherwise clear. No mastoid effusion. Other: None. IMPRESSION: 1. Stable appearance of the brain. No acute intracranial abnormality identified. 2. Intracranial atherosclerosis. Electronically Signed   By: Rise Mu M.D.   On: 06/24/2017 13:46      Current Discharge Medication List    CONTINUE these medications which have NOT CHANGED   Details  albuterol (PROVENTIL HFA;VENTOLIN HFA) 108 (90 BASE) MCG/ACT inhaler Inhale 4-6 puffs by mouth every 4 hours as needed for wheezing, cough, and/or shortness of breath Qty: 1 Inhaler, Refills: 1    alendronate (FOSAMAX) 70 MG/75ML solution Take 70 mg by mouth every 7 (seven) days. Take with a full glass of water on an empty stomach.    benzonatate (TESSALON) 100 MG capsule Take 100 mg by mouth  3 (three) times daily as needed for cough.    benztropine (COGENTIN) 1 MG tablet Take 1 tablet (1 mg total) by mouth 2 (two) times daily. Qty: 60 tablet, Refills: 2    budesonide-formoterol (SYMBICORT) 80-4.5 MCG/ACT inhaler Inhale 2 puffs into the lungs 2 (two) times daily.    busPIRone (BUSPAR) 5 MG tablet Take 5 mg by mouth 2 (two) times daily.     calcium carbonate (OSCAL) 1500 (600 Ca) MG TABS tablet Take 600 mg of elemental calcium by mouth 2 (two) times daily with a meal.    carvedilol (COREG) 3.125 MG tablet Take 3.125 mg by mouth 2 (two) times daily with a meal.    cetirizine (ZYRTEC) 10 MG tablet Take 10 mg by mouth daily.    Cholecalciferol (VITAMIN D3) 5000 units CAPS Take 1 capsule by mouth daily.    fluticasone (FLONASE) 50 MCG/ACT nasal spray Place 1 spray into both nostrils daily.     Levothyroxine Sodium 100 MCG CAPS Take 100 mcg by mouth daily before breakfast.     lithium carbonate 300 MG capsule Take 300  mg by mouth 2 (two) times daily with a meal.    nystatin (NYSTATIN) powder Apply topically 2 (two) times daily.    Omega-3 Fatty Acids (FISH OIL) 1000 MG CAPS Take 1 capsule by mouth 2 (two) times daily.    risperiDONE (RISPERDAL) 1 MG tablet Take 1 mg by mouth 2 (two) times daily.     tamsulosin (FLOMAX) 0.4 MG CAPS capsule Take 1 capsule (0.4 mg total) by mouth daily. Qty: 90 capsule, Refills: 3           Management plans discussed with the patient and she is in agreement. Stable for discharge  Patient should follow up with PCP and cardiology CODE STATUS:     Code Status Orders        Start     Ordered   06/23/17 1945  Full code  Continuous     06/23/17 1944    Code Status History    Date Active Date Inactive Code Status Order ID Comments User Context   03/15/2017  5:45 PM 03/17/2017  8:24 PM Full Code [id number]  Altamese Dilling, MD Inpatient      TOTAL TIME TAKING CARE OF THIS PATIENT: 37 minutes.    Note: This dictation was prepared with Dragon dictation along with smaller phrase technology. Any transcriptional errors that result from this process are unintentional.  Leopoldo Mazzie M.D on 06/25/2017 at 8:49 AM  Between 7am to 6pm - Pager - [phone] After 6pm go to [email or web address] - Social research officer, government  [facility]  Office  [phone]  CC: Primary care physician; Armando Gang, FNP

## 2017-06-25 NOTE — Care Management (Signed)
Spoke with group home administrator and informed that preference for home health services is [facility].  Referral for SN and PT called to [facility] and referral accepted

## 2017-06-25 NOTE — Progress Notes (Signed)
Subjective: Patient with no further events.    Objective: Current vital signs: BP (!) 131/93 (BP Location: Right Arm)   Pulse 81   Temp 98 F (36.7 C) (Oral)   Resp 20   Ht  (1.575 m)   Wt 78.7 kg (173 lb 6.4 oz)   SpO2 96%   BMI 31.72 kg/m  Vital signs in last 24 hours: Temp:  [98 F (36.7 C)-98.8 F (37.1 C)] 98 F (36.7 C) (09/19 1143) Pulse Rate:  [64-82] 81 (09/19 1143) Resp:  [17-20] 20 (09/19 1143) BP: (108-132)/(51-93) 131/93 (09/19 1143) SpO2:  [96 %-100 %] 96 % (09/19 1143)  Intake/Output from previous day: 09/18 0701 - 09/19 0700 In: 600 [P.O.:600] Out: 3400 [Urine:3400] Intake/Output this shift: Total I/O In: 240 [P.O.:240] Out: 900 [Urine:900] Nutritional status: Diet Heart Room service appropriate? Yes; Fluid consistency: Thin  Neurologic Exam: Mental Status: Alert, oriented, thought content appropriate.  Speech fluent without evidence of aphasia.  Able to follow 3 step commands without difficulty. Cranial Nerves: II: Discs flat bilaterally; Visual fields grossly normal, pupils equal, round, reactive to light and accommodation III,IV, VI: ptosis not present, extra-ocular motions intact bilaterally V,VII: decrease in the right NLF, facial light touch sensation normal bilaterally VIII: hearing normal bilaterally IX,X: gag reflex present XI: bilateral shoulder shrug XII: midline tongue extension Motor: Right :  Upper extremity   5/5                                      Left:     Upper extremity   5/5             Lower extremity   5/5                                                  Lower extremity   5/5 Coarse myoclonic-like tremor in the upper extremities bilaterally   Lab Results: Basic Metabolic Panel:  Recent Labs Lab 06/23/17 1427 06/24/17 0513  NA 126* 139  K 4.9 5.0  CL 98* 111  CO2 20* 25  GLUCOSE 92 110*  BUN 18 16  CREATININE 0.95 0.95  CALCIUM 9.3 8.9    Liver Function Tests:  Recent Labs Lab 06/23/17 2048  AST 26   ALT 10*  ALKPHOS 105  BILITOT 0.6  PROT 5.6*  ALBUMIN 3.2*   No results for input(s): LIPASE, AMYLASE in the last 168 hours. No results for input(s): AMMONIA in the last 168 hours.  CBC:  Recent Labs Lab 06/23/17 1427 06/24/17 0513  WBC 11.2* 5.4  HGB 13.0 12.3  HCT 40.3 38.1  MCV 89.7 88.3  PLT 108* 165    Cardiac Enzymes:  Recent Labs Lab 06/23/17 2048 06/24/17 0034 06/24/17 0513  TROPONINI <0.03 <0.03 <0.03    Lipid Panel: No results for input(s): CHOL, TRIG, HDL, CHOLHDL, VLDL, LDLCALC in the last 168 hours.  CBG: No results for input(s): GLUCAP in the last 168 hours.  Microbiology: Results for orders placed or performed during the hospital encounter of 06/23/17  Urine culture     Status: Abnormal (Preliminary result)   Collection Time: 06/23/17  2:27 PM  Result Value Ref Range Status   Specimen Description URINE, CLEAN CATCH  Final   Special Requests NONE  Final   Culture >=100,000 COLONIES/mL GRAM NEGATIVE RODS (A)  Final   Report Status PENDING  Incomplete  MRSA PCR Screening     Status: None   Collection Time: 06/24/17  2:51 AM  Result Value Ref Range Status   MRSA by PCR NEGATIVE NEGATIVE Final    Comment:        The GeneXpert MRSA Assay (FDA approved for NASAL specimens only), is one component of a comprehensive MRSA colonization surveillance program. It is not intended to diagnose MRSA infection nor to guide or monitor treatment for MRSA infections.     Coagulation Studies: No results for input(s): LABPROT, INR in the last 72 hours.  Imaging: Dg Chest 2 View  Result Date: 06/23/2017 CLINICAL DATA:  Sudden onset dizziness resulting in a syncopal episode and fall today. Ex-smoker. EXAM: CHEST  2 VIEW COMPARISON:  04/14/2017, 03/15/2017 and 07/01/2015. FINDINGS: The cardiac silhouette remains borderline enlarged. Interval minimal left pleural effusion and minimal left basilar linear atelectasis. The lungs remain mildly hyperexpanded with  mild diffuse peribronchial thickening and accentuation of the interstitial markings. No fracture or pneumothorax seen. There are multiple old, healed left rib fractures. Mild scoliosis. Aortic arch calcifications. Upper abdominal surgical clips. IMPRESSION: 1. Minimal left pleural fluid and minimal left basilar atelectasis. 2. Stable borderline cardiomegaly and mild changes of COPD and chronic bronchitis. Electronically Signed   By: Beckie Salts M.D.   On: 06/23/2017 17:23   Dg Thoracic Spine 2 View  Result Date: 06/23/2017 CLINICAL DATA:  Back pain following a fall during a syncopal episode today. EXAM: THORACIC SPINE 2 VIEWS COMPARISON:  Chest radiographs obtained today, 04/14/2017, 03/15/2017 and 07/01/2015. FINDINGS: Mild scoliosis. Minimal anterior spur formation at multiple levels. No fractures or subluxations. Bilateral upper abdominal surgical clips. Atheromatous arterial calcifications. IMPRESSION: 1. No fracture or subluxation. 2. Mild scoliosis and minimal degenerative changes. Electronically Signed   By: Beckie Salts M.D.   On: 06/23/2017 17:18   Dg Lumbar Spine Complete  Result Date: 06/23/2017 CLINICAL DATA:  Back pain following a fall during a syncopal episode today. EXAM: LUMBAR SPINE - COMPLETE 4+ VIEW COMPARISON:  12/06/2014. FINDINGS: Five non-rib-bearing lumbar vertebrae and transitional thoracolumbar vertebra. Minimal anterior spur formation at the L3-4 and L4-5 levels. No fractures, pars defects or subluxations. Atheromatous arterial calcifications. Abdominal surgical clips and staples. IMPRESSION: 1. No fracture or subluxation. 2. Minimal degenerative changes. 3. Extensive atheromatous arterial calcifications. Electronically Signed   By: Beckie Salts M.D.   On: 06/23/2017 17:20   Ct Head Wo Contrast  Result Date: 06/24/2017 CLINICAL DATA:  Initial evaluation for recurrent syncope. EXAM: CT HEAD WITHOUT CONTRAST TECHNIQUE: Contiguous axial images were obtained from the base of the  skull through the vertex without intravenous contrast. COMPARISON:  Prior CT from 08/22/2016. FINDINGS: Brain: Cerebral volume is stable, and remains within normal limits for age. No acute intracranial hemorrhage. No evidence for acute large vessel territory infarct. No mass lesion, midline shift or mass effect. No hydrocephalus. No extra-axial fluid collection. Vascular: No worrisome hyperdense vessel. Atherosclerotic change again noted within the carotid siphons. Skull: Scalp soft tissues and calvarium within normal limits. Sinuses/Orbits: Visualized globes and orbital soft tissues are normal. Scattered mucosal thickening within the ethmoid air cells. Remainder the visualized paranasal sinuses otherwise clear. No mastoid effusion. Other: None. IMPRESSION: 1. Stable appearance of the brain. No acute intracranial abnormality identified. 2. Intracranial atherosclerosis. Electronically Signed   By: Rise Mu M.D.   On: 06/24/2017 13:46    Medications:  I have reviewed the patient's current medications. Scheduled: . busPIRone  5 mg Oral BID  . calcium carbonate  3 tablet Oral BID WC  . carvedilol  3.125 mg Oral BID WC  . fluticasone  1 spray Each Nare Daily  . heparin  5,000 Units Subcutaneous Q8H  . levothyroxine  100 mcg Oral QAC breakfast  . lithium carbonate  300 mg Oral BID WC  . loratadine  10 mg Oral Daily  . mouth rinse  15 mL Mouth Rinse BID  . mometasone-formoterol  2 puff Inhalation BID  . risperiDONE  1 mg Oral BID  . tamsulosin  0.4 mg Oral Daily    Assessment/Plan: Unclear etiology of event.  Although seizure remains in the differential, description is not consistent, patient not having them very frequently and in this case would have been considered provoked.  Head CT reviewed and shows no acute changes.  Patient not orthostatic.  Recommendations: 1.  Anticonvulsant therapy not indicated at this time.     LOS: 0 days   Thana Farr,  MD Neurology [phone] 06/25/2017  12:24 PM

## 2017-06-25 NOTE — Progress Notes (Signed)
Pt being discharged back to group home. CSW arranged discharge transportation with group home. IV & tele removed. Pt resting in chair awaiting ride. No complaintss, VSS.

## 2017-06-27 LAB — URINE CULTURE: Culture: >=100000 — AB

## 2017-08-09 ENCOUNTER — Emergency Department
Admission: EM | Admit: 2017-08-09 | Discharge: 2017-08-09 | Disposition: A | Discharge status: Home / Self Care | Payer: Medicare Other | Attending: Emergency Medicine | Admitting: Emergency Medicine

## 2017-08-09 ENCOUNTER — Emergency Department: Payer: Medicare Other

## 2017-08-09 DIAGNOSIS — J441 Chronic obstructive pulmonary disease with (acute) exacerbation: Secondary | ICD-10-CM | POA: Insufficient documentation

## 2017-08-09 DIAGNOSIS — R0602 Shortness of breath: Secondary | ICD-10-CM | POA: Diagnosis present

## 2017-08-09 DIAGNOSIS — R0600 Dyspnea, unspecified: Secondary | ICD-10-CM | POA: Diagnosis not present

## 2017-08-09 DIAGNOSIS — J45909 Unspecified asthma, uncomplicated: Secondary | ICD-10-CM | POA: Insufficient documentation

## 2017-08-09 DIAGNOSIS — Z79899 Other long term (current) drug therapy: Secondary | ICD-10-CM | POA: Insufficient documentation

## 2017-08-09 DIAGNOSIS — Z87891 Personal history of nicotine dependence: Secondary | ICD-10-CM | POA: Diagnosis not present

## 2017-08-09 LAB — CBC WITH DIFFERENTIAL/PLATELET
BASOS ABS: 0 10*3/uL (ref 0–0.1)
Basophils Relative: 0 %
EOS PCT: 2 %
Eosinophils Absolute: 0.1 10*3/uL (ref 0–0.7)
HCT: 37.7 % (ref 35.0–47.0)
Hemoglobin: 12.1 g/dL (ref 12.0–16.0)
LYMPHS PCT: 20 %
Lymphs Abs: 1.2 10*3/uL (ref 1.0–3.6)
MCH: 30.4 pg (ref 26.0–34.0)
MCHC: 32 g/dL (ref 32.0–36.0)
MCV: 94.9 fL (ref 80.0–100.0)
MONO ABS: 0.5 10*3/uL (ref 0.2–0.9)
Monocytes Relative: 8 %
Neutro Abs: 4.2 10*3/uL (ref 1.4–6.5)
Neutrophils Relative %: 70 %
PLATELETS: 188 10*3/uL (ref 150–440)
RBC: 3.97 MIL/uL (ref 3.80–5.20)
RDW: 18.1 % — AB (ref 11.5–14.5)
WBC: 6 10*3/uL (ref 3.6–11.0)

## 2017-08-09 LAB — BASIC METABOLIC PANEL
Anion gap: 3 — ABNORMAL LOW (ref 5–15)
BUN: 16 mg/dL (ref 6–20)
CO2: 26 mmol/L (ref 22–32)
Calcium: 9.1 mg/dL (ref 8.9–10.3)
Chloride: 108 mmol/L (ref 101–111)
Creatinine, Ser: 1.2 mg/dL — ABNORMAL HIGH (ref 0.44–1.00)
GFR calc Af Amer: 57 mL/min — ABNORMAL LOW (ref >60)
GFR, EST NON AFRICAN AMERICAN: 50 mL/min — AB (ref >60)
GLUCOSE: 113 mg/dL — AB (ref 65–99)
POTASSIUM: 5 mmol/L (ref 3.5–5.1)
Sodium: 137 mmol/L (ref 135–145)

## 2017-08-09 LAB — TROPONIN I: Troponin I: <0.03 ng/mL (ref <0.03)

## 2017-08-09 MED ORDER — IOPAMIDOL (ISOVUE-370) INJECTION 76%
75.0000 mL | Freq: Once | INTRAVENOUS | Status: AC | PRN
  Administered 2017-08-09: 75 mL via INTRAVENOUS

## 2017-08-09 MED ORDER — METHYLPREDNISOLONE 4 MG PO TBPK: ORAL_TABLET | ORAL | 0 refills | Status: DC

## 2017-08-09 MED ORDER — DOXYCYCLINE HYCLATE 100 MG PO TABS
100.0000 mg | ORAL_TABLET | Freq: Once | ORAL | Status: AC
  Administered 2017-08-09: 100 mg via ORAL
  Filled 2017-08-09: qty 1

## 2017-08-09 MED ORDER — IPRATROPIUM-ALBUTEROL 0.5-2.5 (3) MG/3ML IN SOLN
3.0000 mL | Freq: Once | RESPIRATORY_TRACT | Status: AC
  Administered 2017-08-09: 3 mL via RESPIRATORY_TRACT
  Filled 2017-08-09: qty 3

## 2017-08-09 MED ORDER — DOXYCYCLINE HYCLATE 100 MG PO CAPS: 100.0000 mg | ORAL_CAPSULE | Freq: Two times a day (BID) | ORAL | 0 refills | Status: DC

## 2017-08-09 MED ORDER — GUAIFENESIN 100 MG/5ML PO SOLN: 5.0000 mL | ORAL | 0 refills | Status: DC | PRN

## 2017-08-09 MED ORDER — METHYLPREDNISOLONE SODIUM SUCC 125 MG IJ SOLR
125.0000 mg | Freq: Once | INTRAMUSCULAR | Status: AC
  Administered 2017-08-09: 125 mg via INTRAVENOUS
  Filled 2017-08-09: qty 2

## 2017-08-09 MED ORDER — METHYLPREDNISOLONE 4 MG PO TABS
32.0000 mg | ORAL_TABLET | Freq: Every day | ORAL | Status: DC
  Administered 2017-08-09: 32 mg via ORAL
  Filled 2017-08-09: qty 1

## 2017-08-09 NOTE — ED Provider Notes (Signed)
[facility] Emergency Department Provider Note  ____________________________________________  Time seen: Approximately 3:30 PM  I have reviewed the triage vital signs and the nursing notes.   HISTORY  Chief Complaint Shortness of Breath (Hx COPD, uses 2 lpm @ home)    HPI Tammy [name] is a 56 y.o. female who complains of shortness of breath this started suddenly yesterday afternoon. Feels like her COPD, with wheezing. Not improved with her nebulizer treatments at home. EMS found her to be 88% on her home oxygen 2 L nasal cannula. They put her on increased oxygen which normalized her O2 sat. They gave 2 albuterol nebs in route without improvement. Patient also reports having pleuritic chest pain in the center of the chest, nonradiating, worse with deep breathing, no alleviating factors, started when her shortness of breath started yesterday. Constant.     Past Medical History:  Diagnosis Date  . Anxiety   . Asthma   . Bipolar 1 disorder (HCC)   . CHF (congestive heart failure) (HCC)   . COPD (chronic obstructive pulmonary disease) (HCC)   . Dementia   . Epilepsy (HCC)   . Seizures (HCC)   Denies history of venous thromboembolism   Patient Active Problem List   Diagnosis Date Noted  . Syncopal episodes 06/23/2017  . Acute on chronic respiratory failure with hypoxia (HCC) 03/15/2017  . COPD with acute exacerbation (HCC) 03/15/2017  . Acute on chronic respiratory failure (HCC) 03/15/2017     Past Surgical History:  Procedure Laterality Date  . APPENDECTOMY    . hernia reapir     x 3       Prior to Admission medications   Medication Sig Start Date End Date Taking? Authorizing Provider  albuterol (PROVENTIL HFA;VENTOLIN HFA) 108 (90 BASE) MCG/ACT inhaler Inhale 4-6 puffs by mouth every 4 hours as needed for wheezing, cough, and/or shortness of breath 07/01/15  Yes Loleta Rose, MD  benztropine (COGENTIN) 1 MG tablet Take 1 tablet (1 mg  total) by mouth 2 (two) times daily. 07/03/15 06/23/18 Yes Emily Filbert, MD  budesonide-formoterol (SYMBICORT) 80-4.5 MCG/ACT inhaler Inhale 2 puffs into the lungs 2 (two) times daily.   Yes [provider]  busPIRone (BUSPAR) 10 MG tablet Take 5 mg by mouth 2 (two) times daily.    Yes [provider]  carvedilol (COREG) 3.125 MG tablet Take 3.125 mg by mouth 2 (two) times daily with a meal.   Yes [provider]  Levothyroxine Sodium 100 MCG CAPS Take 100 mcg by mouth daily before breakfast.    Yes [provider]  lithium carbonate 300 MG capsule Take 300 mg by mouth 2 (two) times daily with a meal.   Yes [provider]  risperiDONE (RISPERDAL) 1 MG tablet Take 1 mg by mouth 2 (two) times daily.    Yes [provider]  alendronate (FOSAMAX) 70 MG/75ML solution Take 70 mg by mouth every 7 (seven) days. Take with a full glass of water on an empty stomach.    [provider]  benzonatate (TESSALON) 100 MG capsule Take 100 mg by mouth 3 (three) times daily as needed for cough.    [provider]  calcium carbonate (OSCAL) 1500 (600 Ca) MG TABS tablet Take 600 mg of elemental calcium by mouth 2 (two) times daily with a meal.    [provider]  cetirizine (ZYRTEC) 10 MG tablet Take 10 mg by mouth daily.    [provider]  Cholecalciferol (  VITAMIN D3) 5000 units CAPS Take 1 capsule by mouth daily.    [provider]  doxycycline (VIBRAMYCIN) 100 MG capsule Take 1 capsule (100 mg total) by mouth 2 (two) times daily. 08/09/17   Sharman Cheek, MD  fluticasone [facility]) 50 MCG/ACT nasal spray Place 1 spray into both nostrils daily.     [provider]  guaiFENesin (ROBITUSSIN) 100 MG/5ML SOLN Take 5 mLs (100 mg total) by mouth every 4 (four) hours as needed for cough or to loosen phlegm. 08/09/17   Sharman Cheek, MD  methylPREDNISolone (MEDROL) 4 MG TBPK tablet Follow package instructions  for steroid taper regimen. 08/09/17   Sharman Cheek, MD  nystatin (NYSTATIN) powder Apply topically 2 (two) times daily.    [provider]  Omega-3 Fatty Acids (FISH OIL) 1000 MG CAPS Take 1 capsule by mouth 2 (two) times daily.    [provider]  tamsulosin (FLOMAX) 0.4 MG CAPS capsule Take 1 capsule (0.4 mg total) by mouth daily. Patient not taking: Reported on 08/09/2017 03/25/17   Michiel Cowboy A, PA-C     Allergies Penicillins and Prednisone   Family History  Problem Relation Age of Onset  . Hypertension Mother   . Kidney cancer Neg Hx   . Bladder Cancer Neg Hx     Social History Social History  Substance Use Topics  . Smoking status: Former Smoker    Quit date: 10/07/1998  . Smokeless tobacco: Never Used  . Alcohol use No    Review of Systems  Constitutional:   No fever or chills.  ENT:   No sore throat. No rhinorrhea. Cardiovascular:  Positive as above chest pain without syncope. Respiratory:   Positive shortness of breath and nonproductive cough. Gastrointestinal:   Negative for abdominal pain, vomiting and diarrhea.  Musculoskeletal:   Negative for focal pain or swelling All other systems reviewed and are negative except as documented above in ROS and HPI.  ____________________________________________   PHYSICAL EXAM:  VITAL SIGNS: ED Triage Vitals  Enc Vitals Group     BP --      Pulse --      Resp --      Temp --      Temp src --      SpO2 08/09/17 1522 96 %     Weight 08/09/17 1523 182 lb 3.2 oz (82.6 kg)     Height 08/09/17 1523 5\' 2"  (1.575 m)     Head Circumference --      Peak Flow --      Pain Score --      Pain Loc --      Pain Edu? --      Excl. in GC? --     Vital signs reviewed, nursing assessments reviewed.   Constitutional:   Alert and oriented. Mild respiratory distress Eyes:   No scleral icterus.  EOMI. No nystagmus. No conjunctival pallor. PERRL. ENT   Head:   Normocephalic and atraumatic.    Nose:   No congestion/rhinnorhea.    Mouth/Throat:   MMM, no pharyngeal erythema. No peritonsillar mass.    Neck:   No meningismus. Full ROM. Midline trachea Hematological/Lymphatic/Immunilogical:   No cervical lymphadenopathy. Cardiovascular:   RRR. Symmetric bilateral radial and DP pulses.  No murmurs.  Respiratory:   Increased work of breathing, shallow inspirations, diffuse expiratory wheezing and prolonged expiratory phase. Bibasilar crackles. Diminished breath sounds in all lung fields, symmetric. Gastrointestinal:   Soft and nontender. Non distended. There is no CVA  tenderness.  No rebound, rigidity, or guarding. Genitourinary:   deferred Musculoskeletal:   Normal range of motion in all extremities. No joint effusions.  No lower extremity tenderness.  No edema. Neurologic:   Normal speech and language.  Motor grossly intact. No gross focal neurologic deficits are appreciated.  Skin:    Skin is warm, dry and intact. No rash noted.  No petechiae, purpura, or bullae.  ____________________________________________    LABS (pertinent positives/negatives) (all labs ordered are listed, but only abnormal results are displayed)  ____________________________________________   EKG    ____________________________________________    RADIOLOGY  Ct Angio Chest Pe W And/or Wo Contrast  Result Date: 08/09/2017 CLINICAL DATA:  Patient reports this as exacerbation of her COPD, normally uses 2 lpm O2 at home, has expiratory wheezes, reports pain 7/10 in chest. EXAM: CT ANGIOGRAPHY CHEST WITH CONTRAST TECHNIQUE: Multidetector CT imaging of the chest was performed using the standard protocol during bolus administration of intravenous contrast. Multiplanar CT image reconstructions and MIPs were obtained to evaluate the vascular anatomy. CONTRAST:  75 mL of Isovue 370 intravenous contrast COMPARISON:  Current chest radiograph. FINDINGS: Cardiovascular: Satisfactory opacification of the  pulmonary arteries to the segmental level. No evidence of pulmonary embolism. Normal heart size. No pericardial effusion. Moderate three-vessel coronary artery calcifications. Aorta is normal caliber. There are atherosclerotic calcifications along the thoracic aorta and aortic arch branch vessels. Main and right pulmonary arteries are mildly enlarged, main pulmonary artery measuring 3.8 cm in right pulmonary artery measuring 3.3 cm. Mediastinum/Nodes: No neck base or axillary masses or enlarged lymph nodes. No mediastinal or hilar masses or adenopathy. Trachea is widely patent. Esophagus is unremarkable. Lungs/Pleura: Moderate centrilobular emphysema. Minor lung base subsegmental atelectasis. No evidence of pneumonia or pulmonary edema. No mass or suspicious nodule. No pleural effusion or pneumothorax. Upper Abdomen: No acute finding. Left adrenal thickening with low Hounsfield units, likely hyperplasia. Status post cholecystectomy and stomach surgery. Musculoskeletal: No chest wall abnormality. No acute or significant osseous findings. Review of the MIP images confirms the above findings. IMPRESSION: 1. No evidence of a pulmonary embolism. 2. Moderate coronary artery calcifications. 3. Mild enlargement of the main and right pulmonary arteries. This raises suspicion for pulmonary hypertension. 4. Aortic atherosclerosis. 5. No acute findings in the lungs.  Moderate emphysema. Aortic Atherosclerosis (ICD10-I70.0) and Emphysema (ICD10-J43.9). Electronically Signed   By: Amie Portlandavid  Ormond M.D.   On: 08/09/2017 18:59   Dg Chest Portable 1 View  Result Date: 08/09/2017 CLINICAL DATA:  Chest pain.  Expiratory wheezes.  COPD exacerbation. EXAM: PORTABLE CHEST 1 VIEW COMPARISON:  06/23/2017. FINDINGS: The cardiac silhouette remains borderline enlarged. Aortic calcifications. Mild increase in prominence of the pulmonary vasculature. Stable mildly prominent interstitial markings and mild hyperexpansion of the lungs. Diffuse  osteopenia. IMPRESSION: 1. The interval minimal pulmonary vascular congestion with stable borderline cardiomegaly. 2. Stable mild changes of COPD. Electronically Signed   By: Beckie SaltsSteven  Reid M.D.   On: 08/09/2017 15:55    ____________________________________________   PROCEDURES Procedures  ____________________________________________   DIFFERENTIAL DIAGNOSIS  COPD exacerbation, pneumonia, pneumothorax, pulmonary embolism, ACS, heart failure.  CLINICAL IMPRESSION / ASSESSMENT AND PLAN / ED COURSE  Pertinent labs & imaging results that were available during my care of the patient were reviewed by me and considered in my medical decision making (see chart for details).   Patient presents with shortness of breath, mild gastritis distress with a decreased oxygen requirement to maintain normal O2 sat. Clinically this is most likely COPD, but she is  at high risk for pulmonary embolism with her prior cancer history and recent hospitalization. Plan to get a CT angiogram of the chest as part of her workup. On exam there isn't evidence of peripheral edema or other signs of heart failure, auscultation is not consistent with pulmonary edema or pneumothorax. We'll give Solu-Medrol and bronchodilators in addition to the 2 nebs already given by EMS for symptom relief pending the workup.  Clinical Course as of Aug 09 1924  Sat Aug 09, 2017  1920 Pt feels better. So2 98% on baseline 2l [place]. Lungs with diminished air movement diffusely,but resolved E:I ratio and wheezing. Workup neg. CTA negative for PE. Suitable for DC home on prednisone and doxy.   [PS]    Clinical Course User Index [PS] Sharman Cheek, MD     ----------------------------------------- 7:25 PM on 08/09/2017 -----------------------------------------  EMR list a allergic reaction to prednisone. I'll send her home with a prescription for Medrol Dosepak, low risk for significant allergic  reaction.  ____________________________________________   FINAL CLINICAL IMPRESSION(S) / ED DIAGNOSES    Final diagnoses:  COPD exacerbation (HCC)  Dyspnea, unspecified type      New Prescriptions   DOXYCYCLINE (VIBRAMYCIN) 100 MG CAPSULE    Take 1 capsule (100 mg total) by mouth 2 (two) times daily.   GUAIFENESIN (ROBITUSSIN) 100 MG/5ML SOLN    Take 5 mLs (100 mg total) by mouth every 4 (four) hours as needed for cough or to loosen phlegm.   METHYLPREDNISOLONE (MEDROL) 4 MG TBPK TABLET    Follow package instructions for steroid taper regimen.     Portions of this note were generated with dragon dictation software. Dictation errors may occur despite best attempts at proofreading.    Sharman Cheek, MD 08/09/17 [phone]

## 2017-08-09 NOTE — ED Notes (Signed)
Patient transported to CT 

## 2017-08-09 NOTE — ED Notes (Signed)
Dr Scotty CourtStafford informed of pt's complaint of leg pain; acknowledged; no verbal orders given at this time

## 2017-08-09 NOTE — ED Notes (Signed)
Hold Metformin X 48 hours post IV contrast as Per Dr. Scotty CourtStafford, contrast given [phone]

## 2017-08-09 NOTE — ED Notes (Signed)
Pt's caregiver, Reggie, here to transport her back to the group home;

## 2017-08-09 NOTE — ED Triage Notes (Addendum)
Patient reports this as exacerbation of her COPD, normally uses 2 lpm O2 at home, has expiratory wheezes, reports pain 7/10 in chest - D/T breathing.   This episode started 08/08/17 at approximately 1500  Facility Phone Number   [phone] - Contact: Jerrye BeaversHazel

## 2017-08-09 NOTE — ED Notes (Signed)
Patient returned from CT

## 2017-08-09 NOTE — Discharge Instructions (Signed)
Your labs and CT scan of the chest were unremarkable today.

## 2017-08-12 LAB — BLOOD GAS, VENOUS
ACID-BASE EXCESS: 0.3 mmol/L (ref 0.0–2.0)
Bicarbonate: 27.6 mmol/L (ref 20.0–28.0)
O2 SAT: 77.6 %
PCO2 VEN: 56 mmHg (ref 44.0–60.0)
Patient temperature: 37
pH, Ven: 7.3 (ref 7.250–7.430)
pO2, Ven: 47 mmHg — ABNORMAL HIGH (ref 32.0–45.0)

## 2017-09-06 ENCOUNTER — Emergency Department
Admission: EM | Admit: 2017-09-06 | Discharge: 2017-09-07 | Disposition: A | Discharge status: Home / Self Care | Payer: Medicare Other | Attending: Emergency Medicine | Admitting: Emergency Medicine

## 2017-09-06 DIAGNOSIS — Z79899 Other long term (current) drug therapy: Secondary | ICD-10-CM | POA: Diagnosis not present

## 2017-09-06 DIAGNOSIS — I509 Heart failure, unspecified: Secondary | ICD-10-CM | POA: Diagnosis not present

## 2017-09-06 DIAGNOSIS — R1084 Generalized abdominal pain: Secondary | ICD-10-CM | POA: Diagnosis present

## 2017-09-06 DIAGNOSIS — J45909 Unspecified asthma, uncomplicated: Secondary | ICD-10-CM | POA: Insufficient documentation

## 2017-09-06 DIAGNOSIS — F0390 Unspecified dementia without behavioral disturbance: Secondary | ICD-10-CM | POA: Diagnosis not present

## 2017-09-06 DIAGNOSIS — J449 Chronic obstructive pulmonary disease, unspecified: Secondary | ICD-10-CM | POA: Diagnosis not present

## 2017-09-06 DIAGNOSIS — Z87891 Personal history of nicotine dependence: Secondary | ICD-10-CM | POA: Insufficient documentation

## 2017-09-07 ENCOUNTER — Encounter: Payer: Self-pay | Admitting: Radiology

## 2017-09-07 ENCOUNTER — Emergency Department: Payer: Medicare Other

## 2017-09-07 DIAGNOSIS — R1084 Generalized abdominal pain: Secondary | ICD-10-CM | POA: Diagnosis not present

## 2017-09-07 LAB — CBC WITH DIFFERENTIAL/PLATELET
Basophils Absolute: 0 10*3/uL (ref 0–0.1)
Basophils Relative: 1 %
EOS ABS: 0.1 10*3/uL (ref 0–0.7)
Eosinophils Relative: 2 %
HEMATOCRIT: 36.5 % (ref 35.0–47.0)
HEMOGLOBIN: 11.8 g/dL — AB (ref 12.0–16.0)
LYMPHS ABS: 1 10*3/uL (ref 1.0–3.6)
LYMPHS PCT: 20 %
MCH: 29.9 pg (ref 26.0–34.0)
MCHC: 32.2 g/dL (ref 32.0–36.0)
MCV: 92.8 fL (ref 80.0–100.0)
MONOS PCT: 8 %
Monocytes Absolute: 0.4 10*3/uL (ref 0.2–0.9)
NEUTROS ABS: 3.5 10*3/uL (ref 1.4–6.5)
NEUTROS PCT: 69 %
PLATELETS: 173 10*3/uL (ref 150–440)
RBC: 3.93 MIL/uL (ref 3.80–5.20)
RDW: 15.5 % — AB (ref 11.5–14.5)
WBC: 5.1 10*3/uL (ref 3.6–11.0)

## 2017-09-07 LAB — COMPREHENSIVE METABOLIC PANEL
ALBUMIN: 3.2 g/dL — AB (ref 3.5–5.0)
ALT: 17 U/L (ref 14–54)
AST: 30 U/L (ref 15–41)
Alkaline Phosphatase: 86 U/L (ref 38–126)
Anion gap: 6 (ref 5–15)
BUN: 16 mg/dL (ref 6–20)
CHLORIDE: 108 mmol/L (ref 101–111)
CO2: 26 mmol/L (ref 22–32)
Calcium: 8.9 mg/dL (ref 8.9–10.3)
Creatinine, Ser: 1.25 mg/dL — ABNORMAL HIGH (ref 0.44–1.00)
GFR calc Af Amer: 55 mL/min — ABNORMAL LOW (ref >60)
GFR, EST NON AFRICAN AMERICAN: 47 mL/min — AB (ref >60)
GLUCOSE: 117 mg/dL — AB (ref 65–99)
POTASSIUM: 5 mmol/L (ref 3.5–5.1)
Sodium: 140 mmol/L (ref 135–145)
Total Bilirubin: 0.9 mg/dL (ref 0.3–1.2)
Total Protein: 5.8 g/dL — ABNORMAL LOW (ref 6.5–8.1)

## 2017-09-07 LAB — TROPONIN I: Troponin I: <0.03 ng/mL (ref <0.03)

## 2017-09-07 LAB — LACTIC ACID, PLASMA: Lactic Acid, Venous: 0.9 mmol/L (ref 0.5–1.9)

## 2017-09-07 LAB — LIPASE, BLOOD: Lipase: 21 U/L (ref 11–51)

## 2017-09-07 MED ORDER — IOPAMIDOL (ISOVUE-300) INJECTION 61%
100.0000 mL | Freq: Once | INTRAVENOUS | Status: AC | PRN
  Administered 2017-09-07: 100 mL via INTRAVENOUS

## 2017-09-07 MED ORDER — ONDANSETRON HCL 4 MG/2ML IJ SOLN
4.0000 mg | INTRAMUSCULAR | Status: AC
  Administered 2017-09-07: 4 mg via INTRAVENOUS
  Filled 2017-09-07: qty 2

## 2017-09-07 MED ORDER — MORPHINE SULFATE (PF) 4 MG/ML IV SOLN
4.0000 mg | Freq: Once | INTRAVENOUS | Status: AC
  Administered 2017-09-07: 4 mg via INTRAVENOUS
  Filled 2017-09-07: qty 1

## 2017-09-07 NOTE — Discharge Instructions (Signed)

## 2017-09-07 NOTE — ED Notes (Signed)
Cannot get signature pad in pt's room to load to allow for pt to sign discharge instructions. Instructions reviewed with pt and reggie  spurrill from group home. Report was also previously given to group home staff as previously documented.

## 2017-09-07 NOTE — ED Notes (Signed)
Pt confirms she does not have a legal guardian.

## 2017-09-07 NOTE — ED Provider Notes (Signed)
[facility] Emergency Department Provider Note  ____________________________________________   First MD Initiated Contact with Patient 09/07/17 [id number]     (approximate)  I have reviewed the triage vital signs and the nursing notes.   HISTORY  Chief Complaint Abdominal Pain    HPI Tammy [name] is a 56 y.o. female with extensive chronic medical and psychiatric issues who presents by EMS for evaluation of abdominal pain.  Initially she reported it was epigastric but now she is telling me it is in her lower abdomen and feels like a knife.  She also reports having diarrhea for about a week which she describes as loose stools that occur 2 times a day.  For her this is highly abnormal because she usually only has about 1 bowel movement a week.  The symptoms started shortly after she started taking metformin approximately 1 week ago.  She has severe chronic respiratory issues and is on 2 L of oxygen and states that her breathing is at baseline currently.  She denies fever/chills, chest pain, acute worsening of her shortness of breath, vomiting, and dysuria.  Nothing makes her symptoms better or worse.  Past Medical History:  Diagnosis Date  . Anxiety   . Asthma   . Bipolar 1 disorder (HCC)   . CHF (congestive heart failure) (HCC)   . COPD (chronic obstructive pulmonary disease) (HCC)   . Dementia   . Epilepsy (HCC)   . Seizures [facility](HCC)     Patient Active Problem List   Diagnosis Date Noted  . Syncopal episodes 06/23/2017  . Acute on chronic respiratory failure with hypoxia (HCC) 03/15/2017  . COPD with acute exacerbation (HCC) 03/15/2017  . Acute on chronic respiratory failure (HCC) 03/15/2017    Past Surgical History:  Procedure Laterality Date  . APPENDECTOMY    . hernia reapir     x 3      Prior to Admission medications   Medication Sig Start Date End Date Taking? Authorizing Provider  albuterol (PROVENTIL HFA;VENTOLIN HFA) 108 (90 BASE) MCG/ACT  inhaler Inhale 4-6 puffs by mouth every 4 hours as needed for wheezing, cough, and/or shortness of breath 07/01/15   Loleta RoseForbach, Fabyan Loughmiller, MD  alendronate (FOSAMAX) 70 MG/75ML solution Take 70 mg by mouth every 7 (seven) days. Take with a full glass of water on an empty stomach.    [provider]  benzonatate (TESSALON) 100 MG capsule Take 100 mg by mouth 3 (three) times daily as needed for cough.    [provider]  benztropine (COGENTIN) 1 MG tablet Take 1 tablet (1 mg total) by mouth 2 (two) times daily. 07/03/15 06/23/18  Emily FilbertWilliams, Jonathan E, MD  budesonide-formoterol (SYMBICORT) 80-4.5 MCG/ACT inhaler Inhale 2 puffs into the lungs 2 (two) times daily.    [provider]  busPIRone (BUSPAR) 10 MG tablet Take 5 mg by mouth 2 (two) times daily.     [provider]  calcium carbonate (OSCAL) 1500 (600 Ca) MG TABS tablet Take 600 mg of elemental calcium by mouth 2 (two) times daily with a meal.    [provider]  carvedilol (COREG) 3.125 MG tablet Take 3.125 mg by mouth 2 (two) times daily with a meal.    [provider]  cetirizine (ZYRTEC) 10 MG tablet Take 10 mg by mouth daily.    [provider]  Cholecalciferol (VITAMIN D3) 5000 units CAPS Take 1 capsule by mouth daily.    [provider]  doxycycline (VIBRAMYCIN) 100 MG capsule  Take 1 capsule (100 mg total) by mouth 2 (two) times daily. 08/09/17   Sharman CheekStafford, Phillip, MD  fluticasone [facility](FLONASE) 50 MCG/ACT nasal spray Place 1 spray into both nostrils daily.     [provider]  guaiFENesin (ROBITUSSIN) 100 MG/5ML SOLN Take 5 mLs (100 mg total) by mouth every 4 (four) hours as needed for cough or to loosen phlegm. 08/09/17   Sharman CheekStafford, Phillip, MD  Levothyroxine Sodium 100 MCG CAPS Take 100 mcg by mouth daily before breakfast.     [provider]  lithium carbonate 300 MG capsule Take 300 mg by mouth 2 (two) times daily with a meal.    [provider]    methylPREDNISolone (MEDROL) 4 MG TBPK tablet Follow package instructions for steroid taper regimen. 08/09/17   Sharman CheekStafford, Phillip, MD  nystatin (NYSTATIN) powder Apply topically 2 (two) times daily.    [provider]  Omega-3 Fatty Acids (FISH OIL) 1000 MG CAPS Take 1 capsule by mouth 2 (two) times daily.    [provider]  risperiDONE (RISPERDAL) 1 MG tablet Take 1 mg by mouth 2 (two) times daily.     [provider]  tamsulosin (FLOMAX) 0.4 MG CAPS capsule Take 1 capsule (0.4 mg total) by mouth daily. Patient not taking: Reported on 08/09/2017 03/25/17   Michiel CowboyMcGowan, Shannon A, PA-C    Allergies Penicillins and Prednisone  Family History  Problem Relation Age of Onset  . Hypertension Mother   . Kidney cancer Neg Hx   . Bladder Cancer Neg Hx     Social History Social History   Tobacco Use  . Smoking status: Former Smoker    Last attempt to quit: 10/07/1998    Years since quitting: 18.9  . Smokeless tobacco: Never Used  Substance Use Topics  . Alcohol use: No  . Drug use: No    Review of Systems Constitutional: No fever/chills Eyes: No visual changes. ENT: No sore throat. Cardiovascular: Denies chest pain. Respiratory: Denies shortness of breath. Gastrointestinal: Lower abdominal pain and increased bowel movements for about a week after starting metformin. Nausea, no vomiting Genitourinary: Negative for dysuria. Musculoskeletal: Negative for neck pain.  Negative for back pain. Integumentary: Negative for rash. Neurological: Negative for headaches, focal weakness or numbness.   ____________________________________________   PHYSICAL EXAM:  ED Triage Vitals  Enc Vitals Group     BP 09/06/17 2350 (!) 136/53     Pulse Rate 09/06/17 2350 65     Resp 09/07/17 0030 (!) 21     Temp --      Temp src --      SpO2 09/06/17 2350 99 %     Weight --      Height --      Head Circumference --      Peak Flow --      Pain Score 09/07/17 0016 3     Pain  Loc --      Pain Edu? --      Excl. in GC? --      Constitutional: Alert and oriented. Well appearing and in no acute distress. Eyes: Conjunctivae are normal.  Head: Atraumatic. Nose: No congestion/rhinnorhea. Mouth/Throat: Mucous membranes are moist. Neck: No stridor.  No meningeal signs.   Cardiovascular: Normal rate, regular rhythm. Good peripheral circulation. Grossly normal heart sounds. Respiratory: On 2 L of oxygen chronically.  Respiratory effort is at baseline.  Mild expiratory wheezing throughout. Gastrointestinal: Obese, soft with diffuse tenderness throughout abdomen.  No rebound/guarding Musculoskeletal: No lower  extremity tenderness nor edema. No gross deformities of extremities. Neurologic:  Normal speech and language. No gross focal neurologic deficits are appreciated.  Skin:  Skin is warm, dry and intact. No rash noted.   ____________________________________________   LABS (all labs ordered are listed, but only abnormal results are displayed)  Labs Reviewed  COMPREHENSIVE METABOLIC PANEL - Abnormal; Notable for the following components:      Result Value   Glucose, Bld 117 (*)    Creatinine, Ser 1.25 (*)    Total Protein 5.8 (*)    Albumin 3.2 (*)    GFR calc non Af Amer 47 (*)    GFR calc Af Amer 55 (*)    All other components within normal limits  CBC WITH DIFFERENTIAL/PLATELET - Abnormal; Notable for the following components:   Hemoglobin 11.8 (*)    RDW 15.5 (*)    All other components within normal limits  LIPASE, BLOOD  TROPONIN I  LACTIC ACID, PLASMA  CBC WITH DIFFERENTIAL/PLATELET   ____________________________________________  EKG  None - EKG not ordered by ED physician  ____________________________________________  RADIOLOGY   Ct Abdomen Pelvis W Contrast  Result Date: 09/07/2017 CLINICAL DATA:  Diarrhea and abdominal pain. EXAM: CT ABDOMEN AND PELVIS WITH CONTRAST TECHNIQUE: Multidetector CT imaging of the abdomen and pelvis was  performed using the standard protocol following bolus administration of intravenous contrast. CONTRAST:  ISOVUE-300 IOPAMIDOL (ISOVUE-300) INJECTION 61% COMPARISON:  Abdominal CT 06/05/2011 FINDINGS: Lower chest: Lung bases are clear. No pleural fluid or consolidation. Hepatobiliary: No focal hepatic lesion. Clips in the gallbladder fossa postcholecystectomy. No biliary dilatation. Pancreas: Parenchymal atrophy. No ductal dilatation or inflammation. Spleen: Peripheral/capsular calcifications.  No focal lesion. Adrenals/Urinary Tract: Lobular left adrenal thickening without well-defined nodule. Right adrenal gland is normal. No hydronephrosis or perinephric edema. Homogeneous renal enhancement with symmetric excretion on delayed phase imaging. Urinary bladder is physiologically distended without wall thickening. Stomach/Bowel: Postsurgical change in the stomach with probable gastrojejunostomy. No associated obstruction or wall thickening. Suture or stent in the proximal duodenum. Mild fecalization of small bowel contents without abnormal distension or obstruction. No definite bowel wall thickening, lack of enteric contrast limits assessment. Small volume of colonic stool without colonic wall thickening. No liquid stool in the colon. Transverse colon opposed to the anterior abdominal wall in the region of ventral abdominal mesh suggesting adhesions. The appendix is not confidently visualized. Vascular/Lymphatic: Dense aortic atherosclerosis. No aneurysm. No enlarged abdominal or pelvic lymph nodes. Prominent inguinal lymph nodes are likely reactive. Reproductive: Uterus and bilateral adnexa are unremarkable. Other: Post ventral abdominal hernia repair with mesh. No evidence of recurrent hernia. No ascites, free air or intra-abdominal abscess. Musculoskeletal: Hemangioma within L3 vertebral body. There are no acute or suspicious osseous abnormalities. IMPRESSION: 1. No acute abnormality or explanation for  abdominal pain. 2. Left adrenal thickening without discrete nodule, likely hyperplasia. 3.  Aortic Atherosclerosis (ICD10-I70.0). Electronically Signed   By: Rubye Oaks M.D.   On: 09/07/2017 02:34    ____________________________________________   PROCEDURES  Critical Care performed: No   Procedure(s) performed:   Procedures   ____________________________________________   INITIAL IMPRESSION / ASSESSMENT AND PLAN / ED COURSE  As part of my medical decision making, I reviewed the following data within the electronic MEDICAL RECORD NUMBER Nursing notes reviewed and incorporated, Labs reviewed , Old chart reviewed and Notes from prior ED visits    Differential diagnosis includes, but is not limited to, medication side effect (Metformin), ovarian cyst, ovarian torsion, acute appendicitis, diverticulitis,  urinary tract infection/pyelonephritis, endometriosis, bowel obstruction, colitis, renal colic, gastroenteritis, hernia, fibroids, endometriosis, etc. however, the patient is well-appearing and her labs are all reassuring with no leukocytosis, creatinine at baseline, negative troponin, and normal lipase as well as a normal lactic acid.  Vital signs are normal.  Given her chronic medical issues and the fact that she is somewhat vague with her history and does have some abdominal pain, I will obtain a CT scan of her abdomen to rule out an acute or emergent medical condition, but I think it is likely she may be having some GI discomfort from her metformin and it is unlikely she will need further treatment at this time.  She understands and agrees with the plan.  Clinical Course as of Sep 08 455  Sun Sep 07, 2017  0251 No acute abnormalities on CT scan.  Will update patient and discharge for outpatient follow up. CT Abdomen Pelvis W Contrast [CF]  0303 Of note, the patient ate a full meal and drink multiple cans of diet soda while in the emergency department.  [CF]  0304 The patient's nurse  will communicate with the patient's legal guardian prior to discharge to coordinate disposition plans.  [CF]    Clinical Course User Index [CF] Loleta Rose, MD    ____________________________________________  FINAL CLINICAL IMPRESSION(S) / ED DIAGNOSES  Final diagnoses:  Generalized abdominal pain     MEDICATIONS GIVEN DURING THIS VISIT:  Medications  morphine 4 MG/ML injection 4 mg (4 mg Intravenous Given 09/07/17 0216)  ondansetron (ZOFRAN) injection 4 mg (4 mg Intravenous Given 09/07/17 0218)  iopamidol (ISOVUE-300) 61 % injection 100 mL (100 mLs Intravenous Contrast Given 09/07/17 0154)     ED Discharge Orders    None       Note:  This document was prepared using Dragon voice recognition software and may include unintentional dictation errors.    Loleta Rose, MD 09/07/17 [phone]

## 2017-09-07 NOTE — ED Notes (Signed)
Spoke with dennis name from pt's care home, who clarifies pt does not have a legal gaurdian. Report given to dennis and dennis instructed to pick pt up as she is ready for discharge.

## 2017-09-07 NOTE — ED Triage Notes (Signed)
[facility]. Pt chief complaint is epigastric abd pain, Pt states diarrhea and abd pain started 1 week ago, when pt started metformin. Pt is a/o on 2 L chronically. Pt is waiting EDP.

## 2017-09-07 NOTE — ED Notes (Signed)
Report from david, rn.  

## 2017-09-08 ENCOUNTER — Encounter: Payer: Self-pay | Admitting: Podiatry

## 2017-09-08 ENCOUNTER — Ambulatory Visit (INDEPENDENT_AMBULATORY_CARE_PROVIDER_SITE_OTHER): Payer: Medicare Other | Admitting: Podiatry

## 2017-09-08 DIAGNOSIS — E119 Type 2 diabetes mellitus without complications: Secondary | ICD-10-CM

## 2017-09-08 DIAGNOSIS — B351 Tinea unguium: Secondary | ICD-10-CM

## 2017-09-08 DIAGNOSIS — M79609 Pain in unspecified limb: Secondary | ICD-10-CM | POA: Diagnosis not present

## 2017-09-08 NOTE — Progress Notes (Signed)
Complaint:  Visit Type: Patient returns to my office for continued preventative foot care services. Complaint: Patient states" my nails have grown long and thick and become painful to walk and wear shoes" Patient has been diagnosed with DM and now takes metformin.. The patient presents for preventative foot care services. No changes to ROS  Podiatric Exam: Vascular: dorsalis pedis and posterior tibial pulses are palpable bilateral. Capillary return is immediate. Temperature gradient is WNL. Skin turgor WNL  Sensorium: Normal Semmes Weinstein monofilament test. Normal tactile sensation bilaterally. Nail Exam: Pt has thick disfigured discolored nails with subungual debris noted bilateral entire nail hallux through fifth toenails Ulcer Exam: There is no evidence of ulcer or pre-ulcerative changes or infection. Orthopedic Exam: Muscle tone and strength are WNL. No limitations in general ROM. No crepitus or effusions noted. Foot type and digits show no abnormalities. Bony prominences are unremarkable. Dorsal  DJD  B/L Skin: No Porokeratosis. No infection or ulcers  Diagnosis:  Onychomycosis, , Pain in right toe, pain in left toes  Treatment & Plan Procedures and Treatment: Consent by patient was obtained for treatment procedures.   Debridement of mycotic and hypertrophic toenails, 1 through 5 bilateral and clearing of subungual debris. No ulceration, no infection noted.  Return Visit-Office Procedure: Patient instructed to return to the office for a follow up visit 3 months for continued evaluation and treatment.    Aryannah Mohon DPM 

## 2017-10-14 NOTE — Progress Notes (Signed)
[facility] Pulmonary Medicine     Assessment and Plan:  COPD. -Continue Symbicort 2 puffs twice daily, discussed with administrator from group home who administers the patient's medicine, I advised that the patient continue to use this with a spacer to make sure she is getting the medication.  Acute on Chronic hypoxic respiratory failure with dyspnea on exertion. -I suspect that this is multifactorial from atelectasis due to obesity and sedentary habits.  I suspect she may also have some aspiration with chronic aspiration pneumonitis. -We will check chest x-ray today, and refer the patient for swallowing eval.  Atelectasis. -Continue activity.  Orders Placed This Encounter  Procedures  . DG Chest 2 View  . DG Swallowing Func-Speech Pathology   Return in about 6 weeks (around 11/26/2017).     Date: 10/14/2017  MRN# [id number] Tammy [name] [birth year]   Tammy [name] is a 57 y.o. old female seen in follow up for chief complaint of  Chief Complaint  Patient presents with  . COPD    SOB w/activity: cough; congestion     HPI:  57 y.o. female former smoker with COPD, CHF, epilepsy admitted 06/09 with COPD exacerbation requiring transient BiPAP. She was discharged on 2L [place].   She is here with administrator from her group home. He notes that at baseline she gets winded with moderate to mild activity. She is still wearing oxygen at 2L. They have been using the spacer. She has not been using the nebulizer, they still have the machine.  She does not have teeth, and sometimes when she drinks she coughs.  Today she is taken off oxygen and her sats are 88% and HR 77.   Desat walk 03/31/17; Her sat at baseline at rest on RA is 90% with HR of 88. After walking 180 feet sat dropped to 84% on RA with HR of 91.   I personally reviewed films from 03/15/17 which showed hyperinflation c/w COPD and bibasilar atelectasis. And CT chest from 08/09/17 which showed no evidence of emphysema.     Medication:    Current Outpatient Medications:  .  albuterol (PROVENTIL HFA;VENTOLIN HFA) 108 (90 BASE) MCG/ACT inhaler, Inhale 4-6 puffs by mouth every 4 hours as needed for wheezing, cough, and/or shortness of breath, Disp: 1 Inhaler, Rfl: 1 .  alendronate (FOSAMAX) 70 MG/75ML solution, Take 70 mg by mouth every 7 (seven) days. Take with a full glass of water on an empty stomach., Disp: , Rfl:  .  benzonatate (TESSALON) 100 MG capsule, Take 100 mg by mouth 3 (three) times daily as needed for cough., Disp: , Rfl:  .  benztropine (COGENTIN) 1 MG tablet, Take 1 tablet (1 mg total) by mouth 2 (two) times daily., Disp: 60 tablet, Rfl: 2 .  budesonide-formoterol (SYMBICORT) 80-4.5 MCG/ACT inhaler, Inhale 2 puffs into the lungs 2 (two) times daily., Disp: , Rfl:  .  busPIRone (BUSPAR) 10 MG tablet, Take 5 mg by mouth 2 (two) times daily. , Disp: , Rfl:  .  calcium carbonate (OSCAL) 1500 (600 Ca) MG TABS tablet, Take 600 mg of elemental calcium by mouth 2 (two) times daily with a meal., Disp: , Rfl:  .  carvedilol (COREG) 3.125 MG tablet, Take 3.125 mg by mouth 2 (two) times daily with a meal., Disp: , Rfl:  .  cetirizine (ZYRTEC) 10 MG tablet, Take 10 mg by mouth daily., Disp: , Rfl:  .  Cholecalciferol (VITAMIN D3) 5000 units CAPS, Take 1 capsule by mouth daily., Disp: ,  Rfl:  .  doxycycline (VIBRAMYCIN) 100 MG capsule, Take 1 capsule (100 mg total) by mouth 2 (two) times daily., Disp: 28 capsule, Rfl: 0 .  fluticasone (FLONASE) 50 MCG/ACT nasal spray, Place 1 spray into both nostrils daily. , Disp: , Rfl:  .  guaiFENesin (ROBITUSSIN) 100 MG/5ML SOLN, Take 5 mLs (100 mg total) by mouth every 4 (four) hours as needed for cough or to loosen phlegm., Disp: 120 mL, Rfl: 0 .  Levothyroxine Sodium 100 MCG CAPS, Take 100 mcg by mouth daily before breakfast. , Disp: , Rfl:  .  lithium carbonate 300 MG capsule, Take 300 mg by mouth 2 (two) times daily with a meal., Disp: , Rfl:  .  methylPREDNISolone  (MEDROL) 4 MG TBPK tablet, Follow package instructions for steroid taper regimen., Disp: 21 tablet, Rfl: 0 .  nystatin (NYSTATIN) powder, Apply topically 2 (two) times daily., Disp: , Rfl:  .  Omega-3 Fatty Acids (FISH OIL) 1000 MG CAPS, Take 1 capsule by mouth 2 (two) times daily., Disp: , Rfl:  .  risperiDONE (RISPERDAL) 1 MG tablet, Take 1 mg by mouth 2 (two) times daily. , Disp: , Rfl:  .  tamsulosin (FLOMAX) 0.4 MG CAPS capsule, Take 1 capsule (0.4 mg total) by mouth daily. (Patient not taking: Reported on 08/09/2017), Disp: 90 capsule, Rfl: 3   Allergies:  Penicillins and Prednisone  Review of Systems: Gen:  Denies  fever, sweats. HEENT: Denies blurred vision. Cvc:  No dizziness, chest pain or heaviness Resp:   Denies cough or sputum porduction. Gi: Denies swallowing difficulty, stomach pain. constipation, bowel incontinence Gu:  Denies bladder incontinence, burning urine Ext:   No Joint pain, stiffness. Skin: No skin rash, easy bruising. Endoc:  No polyuria, polydipsia. Psych: No depression, insomnia. Other:  All other systems were reviewed and found to be negative other than what is mentioned in the HPI.   Physical Examination:   VS: BP 138/88 (BP Location: Right Arm, Cuff Size: Normal)   Pulse 79   Ht 5\' 2"  (1.575 m)   Wt 182 lb (82.6 kg)   SpO2 93%   BMI 33.29 kg/m    General Appearance: No distress  Neuro:without focal findings,  speech normal,  HEENT: PERRLA, EOM intact. Pulmonary: Scattered rhonchi. CardiovascularNormal S1,S2.  No m/r/g.   Abdomen: Benign, Soft, non-tender. Renal:  No costovertebral tenderness  GU:  Not performed at this time. Endoc: No evident thyromegaly, no signs of acromegaly. Skin:   warm, no rash. Extremities: normal, no cyanosis, clubbing.   LABORATORY PANEL:   CBC No results for input(s): WBC, HGB, HCT, PLT in the last 168  hours. ------------------------------------------------------------------------------------------------------------------  Chemistries  No results for input(s): NA, K, CL, CO2, GLUCOSE, BUN, CREATININE, CALCIUM, MG, AST, ALT, ALKPHOS, BILITOT in the last 168 hours.  Invalid input(s): GFRCGP ------------------------------------------------------------------------------------------------------------------  Cardiac Enzymes No results for input(s): TROPONINI in the last 168 hours. ------------------------------------------------------------  RADIOLOGY:    Results for orders placed during the hospital encounter of 07/01/15  DG Chest 2 View   Narrative CLINICAL DATA:  Chest pain and cough for 1 week  EXAM: CHEST - 2 VIEW  COMPARISON:  01/29/2015  FINDINGS: Cardiac shadow is stable. Aortic calcifications are again noted. The lungs are well aerated bilaterally but hyperinflated. No focal infiltrate or sizable effusion is seen. No bony abnormality is noted.  IMPRESSION: COPD without acute abnormality.   Electronically Signed   By: Alcide CleverMark  Lukens M.D.   On: 07/01/2015 19:19    ------------------------------------------------------------------------------------------------------------------  Thank  you for allowing [facility] to assist in the care of your patient. Our recommendations are noted above.  Please contact us if we can be of further service.   Wells Guiles, MD.  [facility] Office Number: [phone]  Santiago Glad, M.D.  Billy Fischer, M.D  10/14/2017

## 2017-10-15 ENCOUNTER — Ambulatory Visit (INDEPENDENT_AMBULATORY_CARE_PROVIDER_SITE_OTHER): Payer: Medicare Other | Admitting: Internal Medicine

## 2017-10-15 ENCOUNTER — Ambulatory Visit
Admission: RE | Admit: 2017-10-15 | Discharge: 2017-10-15 | Disposition: A | Discharge status: Home / Self Care | Payer: Medicare Other | Source: Ambulatory Visit | Attending: Internal Medicine | Admitting: Internal Medicine

## 2017-10-15 ENCOUNTER — Encounter: Payer: Self-pay | Admitting: Internal Medicine

## 2017-10-15 VITALS — BP 138/88 | HR 79 | Ht 62.0 in | Wt 182.0 lb

## 2017-10-15 DIAGNOSIS — R1310 Dysphagia, unspecified: Secondary | ICD-10-CM

## 2017-10-15 DIAGNOSIS — R0609 Other forms of dyspnea: Secondary | ICD-10-CM

## 2017-10-15 DIAGNOSIS — J449 Chronic obstructive pulmonary disease, unspecified: Secondary | ICD-10-CM | POA: Insufficient documentation

## 2017-10-15 NOTE — Patient Instructions (Addendum)
Will send for a swallow eval to make sure swallowing is normal.  Will send for a CXR.

## 2017-11-07 ENCOUNTER — Ambulatory Visit: Admission: RE | Admit: 2017-11-07 | Payer: Medicare Other | Source: Ambulatory Visit

## 2017-11-25 NOTE — Progress Notes (Deleted)
[facility]* [facility]     Assessment and Plan:  COPD. -Continue Symbicort 2 puffs twice daily, discussed with administrator from group home who administers the patient's medicine, I advised that the patient continue to use this with a spacer to make sure she is getting the medication.  Acute on Chronic hypoxic respiratory failure with dyspnea on exertion. -I suspect that this is multifactorial from atelectasis due to obesity and sedentary habits.  I suspect she may also have some aspiration with chronic aspiration pneumonitis. -We will check chest x-ray today, and refer the patient for swallowing eval.  Atelectasis. -Continue activity.  No orders of the defined types were placed in this encounter.  No Follow-up on file.     Date: 11/25/2017  MRN# [id number] Tammy [name] 06-Jan-1961   Tammy [name] is a 57 y.o. old female seen in follow up for chief complaint of  No chief complaint on file.    Synopsis: 57 y.o. female former smoker with COPD, CHF, epilepsy admitted 06/09 with COPD exacerbation requiring transient BiPAP. She was discharged on 2L [place].  At last visit she was advised to use record with a spacer.  We also ordered a chest x-ray and sent the patient for swallow eval to test for aspiration  She is here with administrator from her group home. He notes that at baseline she gets winded with moderate to mild activity. She is still wearing oxygen at 2L. They have been using the spacer. She has not been using the nebulizer, they still have the machine.  She does not have teeth, and sometimes when she drinks she coughs.  Today she is taken off oxygen and her sats are 88% and HR 77.   Desat walk 03/31/17; Her sat at baseline at rest on RA is 90% with HR of 88. After walking 180 feet sat dropped to 84% on RA with HR of 91.   I personally reviewed films from 03/15/17 and chest x-ray from 10/15/17 which showed hyperinflation c/w COPD and bibasilar atelectasis. And  CT chest from 08/09/17 which showed no evidence of emphysema.    Medication:    Current Outpatient Medications:  .  albuterol (PROVENTIL HFA;VENTOLIN HFA) 108 (90 BASE) MCG/ACT inhaler, Inhale 4-6 puffs by mouth every 4 hours as needed for wheezing, cough, and/or shortness of breath, Disp: 1 Inhaler, Rfl: 1 .  alendronate (FOSAMAX) 70 MG/75ML solution, Take 70 mg by mouth every 7 (seven) days. Take with a full glass of water on an empty stomach., Disp: , Rfl:  .  benzonatate (TESSALON) 100 MG capsule, Take 100 mg by mouth 3 (three) times daily as needed for cough., Disp: , Rfl:  .  benztropine (COGENTIN) 1 MG tablet, Take 1 tablet (1 mg total) by mouth 2 (two) times daily., Disp: 60 tablet, Rfl: 2 .  budesonide-formoterol (SYMBICORT) 80-4.5 MCG/ACT inhaler, Inhale 2 puffs into the lungs 2 (two) times daily., Disp: , Rfl:  .  busPIRone (BUSPAR) 10 MG tablet, Take 5 mg by mouth 2 (two) times daily. , Disp: , Rfl:  .  calcium carbonate (OSCAL) 1500 (600 Ca) MG TABS tablet, Take 600 mg of elemental calcium by mouth 2 (two) times daily with a meal., Disp: , Rfl:  .  carvedilol (COREG) 3.125 MG tablet, Take 3.125 mg by mouth 2 (two) times daily with a meal., Disp: , Rfl:  .  cetirizine (ZYRTEC) 10 MG tablet, Take 10 mg by mouth daily., Disp: , Rfl:  .  Cholecalciferol (VITAMIN  D3) 5000 units CAPS, Take 1 capsule by mouth daily., Disp: , Rfl:  .  doxycycline (VIBRAMYCIN) 100 MG capsule, Take 1 capsule (100 mg total) by mouth 2 (two) times daily. (Patient taking differently: Take 100 mg by mouth at bedtime. ), Disp: 28 capsule, Rfl: 0 .  fluticasone (FLONASE) 50 MCG/ACT nasal spray, Place 1 spray into both nostrils daily. , Disp: , Rfl:  .  guaiFENesin (ROBITUSSIN) 100 MG/5ML SOLN, Take 5 mLs (100 mg total) by mouth every 4 (four) hours as needed for cough or to loosen phlegm., Disp: 120 mL, Rfl: 0 .  Levothyroxine Sodium 100 MCG CAPS, Take 100 mcg by mouth daily before breakfast. , Disp: , Rfl:  .   lithium carbonate 300 MG capsule, Take 300 mg by mouth 2 (two) times daily with a meal., Disp: , Rfl:  .  methylPREDNISolone (MEDROL) 4 MG TBPK tablet, Follow package instructions for steroid taper regimen., Disp: 21 tablet, Rfl: 0 .  nystatin (NYSTATIN) powder, Apply topically 2 (two) times daily., Disp: , Rfl:  .  Omega-3 Fatty Acids (FISH OIL) 1000 MG CAPS, Take 1 capsule by mouth 2 (two) times daily., Disp: , Rfl:  .  risperiDONE (RISPERDAL) 1 MG tablet, Take 1 mg by mouth 2 (two) times daily. , Disp: , Rfl:  .  tamsulosin (FLOMAX) 0.4 MG CAPS capsule, Take 1 capsule (0.4 mg total) by mouth daily., Disp: 90 capsule, Rfl: 3   Allergies:  Penicillins and Prednisone  Review of Systems: Gen:  Denies  fever, sweats. HEENT: Denies blurred vision. Cvc:  No dizziness, chest pain or heaviness Resp:   Denies cough or sputum porduction. Gi: Denies swallowing difficulty, stomach pain. constipation, bowel incontinence Gu:  Denies bladder incontinence, burning urine Ext:   No Joint pain, stiffness. Skin: No skin rash, easy bruising. Endoc:  No polyuria, polydipsia. Psych: No depression, insomnia. Other:  All other systems were reviewed and found to be negative other than what is mentioned in the HPI.   Physical Examination:   VS: There were no vitals taken for this visit.   General Appearance: No distress  Neuro:without focal findings,  speech normal,  HEENT: PERRLA, EOM intact. Pulmonary: Scattered rhonchi. CardiovascularNormal S1,S2.  No m/r/g.   Abdomen: Benign, Soft, non-tender. Renal:  No costovertebral tenderness  GU:  Not performed at this time. Endoc: No evident thyromegaly, no signs of acromegaly. Skin:   warm, no rash. Extremities: normal, no cyanosis, clubbing.   LABORATORY PANEL:   CBC No results for input(s): WBC, HGB, HCT, PLT in the last 168  hours. ------------------------------------------------------------------------------------------------------------------  Chemistries  No results for input(s): NA, K, CL, CO2, GLUCOSE, BUN, CREATININE, CALCIUM, MG, AST, ALT, ALKPHOS, BILITOT in the last 168 hours.  Invalid input(s): GFRCGP ------------------------------------------------------------------------------------------------------------------  Cardiac Enzymes No results for input(s): TROPONINI in the last 168 hours. ------------------------------------------------------------  RADIOLOGY:    Results for orders placed during the hospital encounter of 07/01/15  DG Chest 2 View   Narrative CLINICAL DATA:  Chest pain and cough for 1 week  EXAM: CHEST - 2 VIEW  COMPARISON:  01/29/2015  FINDINGS: Cardiac shadow is stable. Aortic calcifications are again noted. The lungs are well aerated bilaterally but hyperinflated. No focal infiltrate or sizable effusion is seen. No bony abnormality is noted.  IMPRESSION: COPD without acute abnormality.   Electronically Signed   By: Alcide Clever M.D.   On: 07/01/2015 19:19    ------------------------------------------------------------------------------------------------------------------  Thank  you for allowing [facility] to assist  in the care of your patient. Our recommendations are noted above.  Please contact us if we can be of further service.   Wells Guiles, MD.  [facility] Office Number: [phone]  Santiago Glad, M.D.  Billy Fischer, M.D  11/25/2017

## 2017-11-26 ENCOUNTER — Ambulatory Visit: Payer: Self-pay | Admitting: Internal Medicine

## 2017-11-27 ENCOUNTER — Encounter: Payer: Self-pay | Admitting: Internal Medicine

## 2017-12-08 ENCOUNTER — Ambulatory Visit (INDEPENDENT_AMBULATORY_CARE_PROVIDER_SITE_OTHER): Payer: Medicare Other | Admitting: Podiatry

## 2017-12-08 ENCOUNTER — Encounter: Payer: Self-pay | Admitting: Podiatry

## 2017-12-08 DIAGNOSIS — B351 Tinea unguium: Secondary | ICD-10-CM

## 2017-12-08 DIAGNOSIS — E119 Type 2 diabetes mellitus without complications: Secondary | ICD-10-CM

## 2017-12-08 DIAGNOSIS — M79609 Pain in unspecified limb: Secondary | ICD-10-CM | POA: Diagnosis not present

## 2017-12-08 NOTE — Progress Notes (Signed)
Complaint:  Visit Type: Patient returns to my office for continued preventative foot care services. Complaint: Patient states" my nails have grown long and thick and become painful to walk and wear shoes" Patient has been diagnosed with DM and now takes metformin.. The patient presents for preventative foot care services. No changes to ROS  Podiatric Exam: Vascular: dorsalis pedis and posterior tibial pulses are palpable bilateral. Capillary return is immediate. Temperature gradient is WNL. Skin turgor WNL  Sensorium: Normal Semmes Weinstein monofilament test. Normal tactile sensation bilaterally. Nail Exam: Pt has thick disfigured discolored nails with subungual debris noted bilateral entire nail hallux through fifth toenails Ulcer Exam: There is no evidence of ulcer or pre-ulcerative changes or infection. Orthopedic Exam: Muscle tone and strength are WNL. No limitations in general ROM. No crepitus or effusions noted. Foot type and digits show no abnormalities. Bony prominences are unremarkable. Dorsal  DJD  B/L Skin: No Porokeratosis. No infection or ulcers  Diagnosis:  Onychomycosis, , Pain in right toe, pain in left toes  Treatment & Plan Procedures and Treatment: Consent by patient was obtained for treatment procedures.   Debridement of mycotic and hypertrophic toenails, 1 through 5 bilateral and clearing of subungual debris. No ulceration, no infection noted.  Return Visit-Office Procedure: Patient instructed to return to the office for a follow up visit 3 months for continued evaluation and treatment.    Trinette Vera DPM 

## 2017-12-26 ENCOUNTER — Emergency Department: Payer: Medicare Other

## 2017-12-26 ENCOUNTER — Emergency Department
Admission: EM | Admit: 2017-12-26 | Discharge: 2017-12-26 | Disposition: A | Discharge status: Home / Self Care | Payer: Medicare Other | Attending: Emergency Medicine | Admitting: Emergency Medicine

## 2017-12-26 DIAGNOSIS — I509 Heart failure, unspecified: Secondary | ICD-10-CM | POA: Diagnosis not present

## 2017-12-26 DIAGNOSIS — R569 Unspecified convulsions: Secondary | ICD-10-CM | POA: Diagnosis present

## 2017-12-26 DIAGNOSIS — Z87891 Personal history of nicotine dependence: Secondary | ICD-10-CM | POA: Insufficient documentation

## 2017-12-26 DIAGNOSIS — Z79899 Other long term (current) drug therapy: Secondary | ICD-10-CM | POA: Diagnosis not present

## 2017-12-26 DIAGNOSIS — J449 Chronic obstructive pulmonary disease, unspecified: Secondary | ICD-10-CM | POA: Insufficient documentation

## 2017-12-26 DIAGNOSIS — J45909 Unspecified asthma, uncomplicated: Secondary | ICD-10-CM | POA: Diagnosis not present

## 2017-12-26 LAB — COMPREHENSIVE METABOLIC PANEL
ALBUMIN: 3.6 g/dL (ref 3.5–5.0)
ALK PHOS: 93 U/L (ref 38–126)
ALT: 12 U/L — ABNORMAL LOW (ref 14–54)
ANION GAP: 9 (ref 5–15)
AST: 24 U/L (ref 15–41)
BILIRUBIN TOTAL: 0.4 mg/dL (ref 0.3–1.2)
BUN: 13 mg/dL (ref 6–20)
CALCIUM: 9.4 mg/dL (ref 8.9–10.3)
CO2: 25 mmol/L (ref 22–32)
CREATININE: 0.94 mg/dL (ref 0.44–1.00)
Chloride: 107 mmol/L (ref 101–111)
GFR calc non Af Amer: >60 mL/min (ref >60)
GLUCOSE: 104 mg/dL — AB (ref 65–99)
Potassium: 4.9 mmol/L (ref 3.5–5.1)
Sodium: 141 mmol/L (ref 135–145)
Total Protein: 6.9 g/dL (ref 6.5–8.1)

## 2017-12-26 LAB — URINE DRUG SCREEN, QUALITATIVE (ARMC ONLY)
AMPHETAMINES, UR SCREEN: NOT DETECTED
BARBITURATES, UR SCREEN: NOT DETECTED
BENZODIAZEPINE, UR SCRN: NOT DETECTED
Cannabinoid 50 Ng, Ur ~~LOC~~: NOT DETECTED
Cocaine Metabolite,Ur ~~LOC~~: NOT DETECTED
MDMA (Ecstasy)Ur Screen: NOT DETECTED
Methadone Scn, Ur: NOT DETECTED
Opiate, Ur Screen: NOT DETECTED
PHENCYCLIDINE (PCP) UR S: NOT DETECTED
Tricyclic, Ur Screen: NOT DETECTED

## 2017-12-26 LAB — URINALYSIS, COMPLETE (UACMP) WITH MICROSCOPIC
Bilirubin Urine: NEGATIVE
GLUCOSE, UA: NEGATIVE mg/dL
Hgb urine dipstick: NEGATIVE
Ketones, ur: NEGATIVE mg/dL
Nitrite: NEGATIVE
PH: 5 (ref 5.0–8.0)
Protein, ur: NEGATIVE mg/dL
SPECIFIC GRAVITY, URINE: 1.006 (ref 1.005–1.030)
Squamous Epithelial / LPF: NONE SEEN

## 2017-12-26 LAB — CBC WITH DIFFERENTIAL/PLATELET
Basophils Absolute: 0 10*3/uL (ref 0–0.1)
Basophils Relative: 0 %
Eosinophils Absolute: 0.1 10*3/uL (ref 0–0.7)
Eosinophils Relative: 2 %
HEMATOCRIT: 38.9 % (ref 35.0–47.0)
HEMOGLOBIN: 12.3 g/dL (ref 12.0–16.0)
LYMPHS ABS: 1 10*3/uL (ref 1.0–3.6)
LYMPHS PCT: 16 %
MCH: 27 pg (ref 26.0–34.0)
MCHC: 31.7 g/dL — AB (ref 32.0–36.0)
MCV: 85.2 fL (ref 80.0–100.0)
MONO ABS: 0.6 10*3/uL (ref 0.2–0.9)
Monocytes Relative: 10 %
NEUTROS ABS: 4.4 10*3/uL (ref 1.4–6.5)
NEUTROS PCT: 72 %
Platelets: 166 10*3/uL (ref 150–440)
RBC: 4.56 MIL/uL (ref 3.80–5.20)
RDW: 16 % — AB (ref 11.5–14.5)
WBC: 6.1 10*3/uL (ref 3.6–11.0)

## 2017-12-26 LAB — LITHIUM LEVEL: Lithium Lvl: <0.06 mmol/L — ABNORMAL LOW (ref 0.60–1.20)

## 2017-12-26 LAB — PREGNANCY, URINE: Preg Test, Ur: NEGATIVE

## 2017-12-26 LAB — GLUCOSE, CAPILLARY: Glucose-Capillary: 142 mg/dL — ABNORMAL HIGH (ref 65–99)

## 2017-12-26 MED ORDER — ACETAMINOPHEN 325 MG PO TABS
650.0000 mg | ORAL_TABLET | Freq: Once | ORAL | Status: AC
  Administered 2017-12-26: 650 mg via ORAL
  Filled 2017-12-26: qty 2

## 2017-12-26 MED ORDER — KETOROLAC TROMETHAMINE 30 MG/ML IJ SOLN
15.0000 mg | Freq: Once | INTRAMUSCULAR | Status: DC
Start: 2017-12-26 — End: 2017-12-26

## 2017-12-26 NOTE — ED Notes (Signed)
PT given crackers. No food tray available at this time.

## 2017-12-26 NOTE — Discharge Instructions (Signed)
Please schedule an appointment for follow-up with neurology. They can help manage your seizures. Please return for any further problems

## 2017-12-26 NOTE — ED Notes (Signed)
Pt's group home called stating they could not pick up patient until shortly after 4

## 2017-12-26 NOTE — ED Triage Notes (Signed)
Patient to ED via [facility] from [facility] c/o seizure. Per EMS patient had an unwitnessed seizure x2, staff found patient lying on ground. At the scene fire department reported to EMS they witnessed a seizure in which patient was semi conscious and twitching. Patient reports hx of seizure dx w/o medications prescribed. Patient alert and oriented in no acute distress. Vital Signs reported per EMS BP 124/73 HR 86 O2 97% 2Ls [place] CBG 214

## 2017-12-26 NOTE — ED Provider Notes (Signed)
[facility] Emergency Department Provider Note   ____________________________________________   First MD Initiated Contact with Patient 12/26/17 1144     (approximate)  I have reviewed the triage vital signs and the nursing notes.   HISTORY  Chief Complaint Seizures   HPI Tammy [name] is a 57 y.o. female who reportedly had 1 or 2 unwitnessed seizures at the group home. EMS was told by fire department that they also saw a seizure where the patient was semiconscious and twitching. The patient has a history of a seizure disorder with no medicines prescribed. I reviewed multiple charts and care everywhere and cannot find any evidence of her having had any antiseizure medication in the last 3 or 4 years.she apparently has one of these seizure episodes proximally once a year. She has a diagnosis of epilepsy and seizures. Patient is now back to her baseline. She complains of a mild headache and some diffuse mid back pain.   Past Medical History:  Diagnosis Date  . Anxiety   . Asthma   . Bipolar 1 disorder (HCC)   . CHF (congestive heart failure) (HCC)   . COPD (chronic obstructive pulmonary disease) (HCC)   . Dementia   . Epilepsy (HCC)   . Seizures [facility])     Patient Active Problem List   Diagnosis Date Noted  . Syncopal episodes 06/23/2017  . Acute on chronic respiratory failure with hypoxia (HCC) 03/15/2017  . COPD with acute exacerbation (HCC) 03/15/2017  . Acute on chronic respiratory failure (HCC) 03/15/2017    Past Surgical History:  Procedure Laterality Date  . APPENDECTOMY    . hernia reapir     x 3      Prior to Admission medications   Medication Sig Start Date End Date Taking? Authorizing Provider  albuterol (PROVENTIL HFA;VENTOLIN HFA) 108 (90 BASE) MCG/ACT inhaler Inhale 4-6 puffs by mouth every 4 hours as needed for wheezing, cough, and/or shortness of breath 07/01/15   Loleta Rose, MD  alendronate (FOSAMAX) 70 MG/75ML solution  Take 70 mg by mouth every 7 (seven) days. Take with a full glass of water on an empty stomach.    [provider]  benzonatate (TESSALON) 100 MG capsule Take 100 mg by mouth 3 (three) times daily as needed for cough.    [provider]  benztropine (COGENTIN) 1 MG tablet Take 1 tablet (1 mg total) by mouth 2 (two) times daily. 07/03/15 06/23/18  Emily Filbert, MD  budesonide-formoterol (SYMBICORT) 80-4.5 MCG/ACT inhaler Inhale 2 puffs into the lungs 2 (two) times daily.    [provider]  busPIRone (BUSPAR) 10 MG tablet Take 5 mg by mouth 2 (two) times daily.     [provider]  calcium carbonate (OSCAL) 1500 (600 Ca) MG TABS tablet Take 600 mg of elemental calcium by mouth 2 (two) times daily with a meal.    [provider]  carvedilol (COREG) 3.125 MG tablet Take 3.125 mg by mouth 2 (two) times daily with a meal.    [provider]  cetirizine (ZYRTEC) 10 MG tablet Take 10 mg by mouth daily.    [provider]  Cholecalciferol (VITAMIN D3) 5000 units CAPS Take 1 capsule by mouth daily.    [provider]  doxycycline (VIBRAMYCIN) 100 MG capsule Take 1 capsule (100 mg total) by mouth 2 (two) times daily. Patient taking differently: Take 100 mg by mouth at bedtime.  08/09/17   Sharman Cheek, MD  fluticasone [facility]) 50  MCG/ACT nasal spray Place 1 spray into both nostrils daily.     [provider]  guaiFENesin (ROBITUSSIN) 100 MG/5ML SOLN Take 5 mLs (100 mg total) by mouth every 4 (four) hours as needed for cough or to loosen phlegm. 08/09/17   Sharman CheekStafford, Phillip, MD  Levothyroxine Sodium 100 MCG CAPS Take 100 mcg by mouth daily before breakfast.     [provider]  lithium carbonate 300 MG capsule Take 300 mg by mouth 2 (two) times daily with a meal.    [provider]  methylPREDNISolone (MEDROL) 4 MG TBPK tablet Follow package instructions for steroid taper regimen. 08/09/17   Sharman CheekStafford,  Phillip, MD  nystatin (NYSTATIN) powder Apply topically 2 (two) times daily.    [provider]  Omega-3 Fatty Acids (FISH OIL) 1000 MG CAPS Take 1 capsule by mouth 2 (two) times daily.    [provider]  risperiDONE (RISPERDAL) 1 MG tablet Take 1 mg by mouth 2 (two) times daily.     [provider]  tamsulosin (FLOMAX) 0.4 MG CAPS capsule Take 1 capsule (0.4 mg total) by mouth daily. 03/25/17   Michiel CowboyMcGowan, Shannon A, PA-C    Allergies Penicillins and Prednisone  Family History  Problem Relation Age of Onset  . Hypertension Mother   . Kidney cancer Neg Hx   . Bladder Cancer Neg Hx     Social History Social History   Tobacco Use  . Smoking status: Former Smoker    Last attempt to quit: 10/07/1998    Years since quitting: 19.2  . Smokeless tobacco: Never Used  Substance Use Topics  . Alcohol use: No  . Drug use: No    Review of Systems  Constitutional: No fever/chills Eyes: No visual changes. ENT: No sore throat. Cardiovascular: Denies chest pain. Respiratory: Denies shortness of breath. Gastrointestinal: No abdominal pain.  No nausea, no vomiting.  No diarrhea.  No constipation. Genitourinary: Negative for dysuria. Musculoskeletal: see history of present illness Skin: Negative for rash. Neurological: Negative for headaches, focal weakness   ____________________________________________   PHYSICAL EXAM:  VITAL SIGNS: ED Triage Vitals  Enc Vitals Group     BP 12/26/17 1021 132/83     Pulse Rate 12/26/17 1021 86     Resp 12/26/17 1021 20     Temp 12/26/17 1021 98.9 F (37.2 C)     Temp Source 12/26/17 1021 Oral     SpO2 12/26/17 1021 98 %     Weight 12/26/17 1022 180 lb (81.6 kg)     Height 12/26/17 1022 5\' 2"  (1.575 m)     Head Circumference --      Peak Flow --      Pain Score 12/26/17 1021 10     Pain Loc --      Pain Edu? --      Excl. in GC? --     Constitutional: Alert and oriented. Well appearing and in no acute  distress. Eyes: Conjunctivae are normal. PERRL. EOMI. Head: Atraumatic. Nose: No congestion/rhinnorhea. Mouth/Throat: Mucous membranes are moist.  Oropharynx non-erythematous. Neck: No stridor.   Cardiovascular: Normal rate, regular rhythm. Grossly normal heart sounds.  Good peripheral circulation. Respiratory: Normal respiratory effort.  No retractions. Lungs CTAB. Gastrointestinal: Soft and nontender. No distention. No abdominal bruits. No CVA tenderness. Musculoskeletal: No lower extremity tenderness nor edema.  patient has mild diffuse tenderness from about L2 to possibly T6. There is no focal or point tenderness. Neurologic:  Normal speech and language. No gross focal  neurologic deficits are appreciated. cranial nerves II through XII are intact of the visual fields were not checked cerebellar finger-nose rapid alternating movements are normal motor strength is 5 over 5 in the arms and legs. Sensation appears to be intact. Skin:  Skin is warm, dry and intact. No rash noted. Psychiatric: Mood and affect are normal. Speech and behavior are normal.  ____________________________________________   LABS (all labs ordered are listed, but only abnormal results are displayed)  Labs Reviewed  GLUCOSE, CAPILLARY - Abnormal; Notable for the following components:      Result Value   Glucose-Capillary 142 (*)    All other components within normal limits  COMPREHENSIVE METABOLIC PANEL - Abnormal; Notable for the following components:   Glucose, Bld 104 (*)    ALT 12 (*)    All other components within normal limits  URINALYSIS, COMPLETE (UACMP) WITH MICROSCOPIC - Abnormal; Notable for the following components:   Color, Urine YELLOW (*)    APPearance HAZY (*)    Leukocytes, UA TRACE (*)    Bacteria, UA MANY (*)    All other components within normal limits  CBC WITH DIFFERENTIAL/PLATELET - Abnormal; Notable for the following components:   MCHC 31.7 (*)    RDW 16.0 (*)    All other components  within normal limits  LITHIUM LEVEL - Abnormal; Notable for the following components:   Lithium Lvl <0.06 (*)    All other components within normal limits  URINE DRUG SCREEN, QUALITATIVE ([facility] ONLY)  PREGNANCY, URINE  CBG MONITORING, ED   ____________________________________________  EKG  EKG read and interpreted by me shows normal sinus rhythm rate of 89 normal axis no acute ST-T wave changes baseline is very irregular and there is a lot of artifact. Computer is reading minimal ST elevation laterally and did not really see that and wasn't perhaps one complex. ____________________________________________  RADIOLOGY  ED MD interpretation:   Official radiology report(s): Ct Head Wo Contrast  Result Date: 12/26/2017 CLINICAL DATA:  Recent seizures EXAM: CT HEAD WITHOUT CONTRAST TECHNIQUE: Contiguous axial images were obtained from the base of the skull through the vertex without intravenous contrast. COMPARISON:  June 24, 2017 FINDINGS: Brain: The ventricles are normal in size and configuration. There is no intracranial mass, hemorrhage, extra-axial fluid collection, or midline shift. The gray-white compartments are normal. No evident acute infarct. Vascular: There is no appreciable hyperdense vessel. There is calcification in each carotid siphon region. Skull: The bony calvarium appears intact. Sinuses/Orbits: There is mucosal thickening in several ethmoid air cells bilaterally. Other visualized paranasal sinuses are clear. Orbits appear symmetric bilaterally. Other: Visualized mastoid air cells are clear. There is debris in the right external auditory canal. IMPRESSION: Mucosal thickening in several ethmoid air cells. Probable cerumen in the right external auditory canal. Foci of arterial vascular calcification noted. Study otherwise unremarkable. Electronically Signed   By: Bretta Bang III M.D.   On: 12/26/2017 13:12     ____________________________________________   PROCEDURES  Procedure(s) performed:   Procedures  Critical Care performed:   ____________________________________________   INITIAL IMPRESSION / ASSESSMENT AND PLAN / ED COURSE  discussed patient in detail with Dr. Thad Ranger on call for neurology. She reviewed her notes she has seen this patient before. 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 That is why she is not on any antiepileptics. Patient has not been on any antiepileptics for some time and this appears to be the reason for it. I will have her follow-up with neurology for further evaluation.  ____________________________________________   FINAL CLINICAL IMPRESSION(S) / ED DIAGNOSES  Final diagnoses:  Seizure [facility])     ED Discharge Orders    None       Note:  This document was prepared using Dragon voice recognition software and may include unintentional dictation errors.    Arnaldo Natal, MD 12/26/17 1353

## 2017-12-26 NOTE — ED Notes (Signed)
Pt's group home representative at bedside. They informed secretary that home oxygen tank was left in ems truck. EMS truck [facility].

## 2017-12-26 NOTE — ED Notes (Signed)
Group home called for discharge.  

## 2017-12-26 NOTE — ED Notes (Signed)
2 unsuccessful attempts to get blood by charge RN. Lab called for collection.

## 2017-12-26 NOTE — ED Notes (Signed)
Permission to treat patient per group home director, Maurine Ministerennis Name, he reports patient does not have legal guardian, group home is responsible. Call facility for discharge [phone].

## 2018-01-19 ENCOUNTER — Encounter: Payer: Self-pay | Admitting: Emergency Medicine

## 2018-01-19 ENCOUNTER — Emergency Department: Payer: Medicare Other

## 2018-01-19 ENCOUNTER — Emergency Department
Admission: EM | Admit: 2018-01-19 | Discharge: 2018-01-19 | Disposition: A | Discharge status: Home / Self Care | Payer: Medicare Other | Attending: Emergency Medicine | Admitting: Emergency Medicine

## 2018-01-19 DIAGNOSIS — I509 Heart failure, unspecified: Secondary | ICD-10-CM | POA: Insufficient documentation

## 2018-01-19 DIAGNOSIS — N3000 Acute cystitis without hematuria: Secondary | ICD-10-CM | POA: Diagnosis not present

## 2018-01-19 DIAGNOSIS — R569 Unspecified convulsions: Secondary | ICD-10-CM | POA: Insufficient documentation

## 2018-01-19 DIAGNOSIS — J449 Chronic obstructive pulmonary disease, unspecified: Secondary | ICD-10-CM | POA: Insufficient documentation

## 2018-01-19 DIAGNOSIS — Z87891 Personal history of nicotine dependence: Secondary | ICD-10-CM | POA: Diagnosis not present

## 2018-01-19 DIAGNOSIS — F0390 Unspecified dementia without behavioral disturbance: Secondary | ICD-10-CM | POA: Insufficient documentation

## 2018-01-19 LAB — COMPREHENSIVE METABOLIC PANEL
ALBUMIN: 3.5 g/dL (ref 3.5–5.0)
ALT: 13 U/L — ABNORMAL LOW (ref 14–54)
AST: 23 U/L (ref 15–41)
Alkaline Phosphatase: 95 U/L (ref 38–126)
Anion gap: 7 (ref 5–15)
BUN: 15 mg/dL (ref 6–20)
CHLORIDE: 101 mmol/L (ref 101–111)
CO2: 29 mmol/L (ref 22–32)
Calcium: 9.4 mg/dL (ref 8.9–10.3)
Creatinine, Ser: 0.85 mg/dL (ref 0.44–1.00)
GFR calc Af Amer: >60 mL/min (ref >60)
Glucose, Bld: 135 mg/dL — ABNORMAL HIGH (ref 65–99)
POTASSIUM: 4.4 mmol/L (ref 3.5–5.1)
Sodium: 137 mmol/L (ref 135–145)
TOTAL PROTEIN: 6.5 g/dL (ref 6.5–8.1)
Total Bilirubin: 0.4 mg/dL (ref 0.3–1.2)

## 2018-01-19 LAB — CBC
HCT: 37 % (ref 35.0–47.0)
Hemoglobin: 12 g/dL (ref 12.0–16.0)
MCH: 27.4 pg (ref 26.0–34.0)
MCHC: 32.3 g/dL (ref 32.0–36.0)
MCV: 84.9 fL (ref 80.0–100.0)
PLATELETS: 181 10*3/uL (ref 150–440)
RBC: 4.36 MIL/uL (ref 3.80–5.20)
RDW: 15.7 % — AB (ref 11.5–14.5)
WBC: 5.6 10*3/uL (ref 3.6–11.0)

## 2018-01-19 LAB — URINALYSIS, COMPLETE (UACMP) WITH MICROSCOPIC
Bilirubin Urine: NEGATIVE
GLUCOSE, UA: NEGATIVE mg/dL
HGB URINE DIPSTICK: NEGATIVE
Ketones, ur: NEGATIVE mg/dL
Nitrite: NEGATIVE
PROTEIN: NEGATIVE mg/dL
RBC / HPF: NONE SEEN RBC/hpf (ref 0–5)
Specific Gravity, Urine: 1.002 — ABNORMAL LOW (ref 1.005–1.030)
pH: 6 (ref 5.0–8.0)

## 2018-01-19 MED ORDER — IBUPROFEN 800 MG PO TABS
800.0000 mg | ORAL_TABLET | Freq: Once | ORAL | Status: AC
  Administered 2018-01-19: 800 mg via ORAL
  Filled 2018-01-19: qty 1

## 2018-01-19 MED ORDER — SODIUM CHLORIDE 0.9 % IV BOLUS
1000.0000 mL | Freq: Once | INTRAVENOUS | Status: AC
  Administered 2018-01-19: 1000 mL via INTRAVENOUS

## 2018-01-19 MED ORDER — SULFAMETHOXAZOLE-TRIMETHOPRIM 800-160 MG PO TABS
1.0000 | ORAL_TABLET | Freq: Once | ORAL | Status: AC
  Administered 2018-01-19: 1 via ORAL
  Filled 2018-01-19: qty 1

## 2018-01-19 MED ORDER — SULFAMETHOXAZOLE-TRIMETHOPRIM 800-160 MG PO TABS: 1.0000 | ORAL_TABLET | Freq: Two times a day (BID) | ORAL | 0 refills | Status: DC

## 2018-01-19 NOTE — ED Triage Notes (Signed)
Patient presents to ED via [facility] from work post fall and "seizure-like activy". Patient drowsy on assessment. Patient wears 2L of O2 at home chronically. Patient is from a group home. Patient reports neck pain and back pain.

## 2018-01-19 NOTE — Discharge Instructions (Addendum)
Please make an appointment with a neurologist for further evaluation of your seizure-like episodes.  The neurologist will make decisions about whether you need to be started on antiseizure medicine.  In the meantime, please take all seizure precautions including not driving, not climbing to heights or dangerous places, not spending long periods of time by yourself.  Please follow-up with your primary care physician for your symptoms today.  They can also follow-up your urine culture.  Return to the emergency department if you develop severe pain, lightheadedness, nausea or vomiting, fever, more seizure-like activity, or any other symptoms concerning to you.

## 2018-01-19 NOTE — ED Notes (Signed)
Tammy [name] from group home at bedside. Updated on plan of care.

## 2018-01-19 NOTE — ED Notes (Signed)
Called Group home and Maurine MinisterDennis will come pick pt up.

## 2018-01-19 NOTE — ED Provider Notes (Signed)
[facility] Emergency Department Provider Note  ____________________________________________  Time seen: Approximately 1:14 PM  I have reviewed the triage vital signs and the nursing notes.   HISTORY  Chief Complaint Seizures    HPI [name] is a 57 y.o. female with a history of dementia, bipolar disorder, anxiety, and seizure-like episodes presenting for seizure-like episode.  The patient reports that she was at school when she went to the bathroom around noon.  She developed a headache which is typical of her symptoms prior to having her seizure-like episodes.  She reports that witnesses told her she fell down and was "shaking."  She does not remember this.  At this time, the patient has a headache which is typical of her post seizure-like episodes, and some new low back pain without any numbness tingling or weakness, urinary or fecal incontinence or retention.  The patient reports that she has breakthrough seizures every couple of weeks but is not on any antiepileptics.  I have reviewed the patient's history; she was seen in the emergency department 3/22 with a similar episode and evaluated by Dr. Thad Ranger, the neurologist on call.  This patient has been extensively worked up for seizures without any organic findings in the past was thought to have nonepileptic episodes, therefore has not been on any medications.  She was discharged with instructions to follow-up with neurology.  Past Medical History:  Diagnosis Date  . Anxiety   . Asthma   . Bipolar 1 disorder (HCC)   . CHF (congestive heart failure) (HCC)   . COPD (chronic obstructive pulmonary disease) (HCC)   . Dementia   . Epilepsy (HCC)   . Seizures [facility])     Patient Active Problem List   Diagnosis Date Noted  . Syncopal episodes 06/23/2017  . Acute on chronic respiratory failure with hypoxia (HCC) 03/15/2017  . COPD with acute exacerbation (HCC) 03/15/2017  . Acute on chronic respiratory  failure (HCC) 03/15/2017    Past Surgical History:  Procedure Laterality Date  . APPENDECTOMY    . hernia reapir     x 3      Current Outpatient Rx  . Order #: [id number] Class: Print  . Order #: [id number] Class: Historical Med  . Order #: [id number] Class: Historical Med  . Order #: [id number] Class: Print  . Order #: [id number] Class: Historical Med  . Order #: [id number] Class: Historical Med  . Order #: [id number] Class: Historical Med  . Order #: [id number] Class: Historical Med  . Order #: [id number] Class: Historical Med  . Order #: [id number] Class: Historical Med  . Order #: [id number] Class: Print  . Order #: [id number] Class: Historical Med  . Order #: [id number] Class: Print  . Order #: [id number] Class: Historical Med  . Order #: [id number] Class: Historical Med  . Order #: [id number] Class: Print  . Order #: [id number] Class: Historical Med  . Order #: [id number] Class: Historical Med  . Order #: [id number] Class: Historical Med  . Order #: [id number] Class: Normal    Allergies Penicillins and Prednisone  Family History  Problem Relation Age of Onset  . Hypertension Mother   . Kidney cancer Neg Hx   . Bladder Cancer Neg Hx     Social History Social History   Tobacco Use  . Smoking status: Former Smoker    Last attempt to quit: 10/07/1998    Years since quitting: 19.2  . Smokeless tobacco: Never Used  Substance Use Topics  . Alcohol use: No  . Drug use: No    Review of Systems Constitutional: No fever/chills.  No lightheadedness.  Positive syncope. Eyes: No visual changes.  No blurred or double vision. ENT: No sore throat. No congestion or rhinorrhea. Cardiovascular: Denies chest pain. Denies palpitations. Respiratory: Denies shortness of breath.  No cough. Gastrointestinal: No abdominal pain.  No nausea, no vomiting.  No diarrhea.  No constipation. Genitourinary: Negative for dysuria. Musculoskeletal: Negative for back pain. Skin: Negative for rash. Neurological: Positive for  headaches. No focal numbness, tingling or weakness.     ____________________________________________   PHYSICAL EXAM:  VITAL SIGNS: ED Triage Vitals  Enc Vitals Group     BP 01/19/18 1258 (!) 141/75     Pulse Rate 01/19/18 1255 (!) 57     Resp 01/19/18 1255 17     Temp 01/19/18 1255 97.8 F (36.6 C)     Temp src --      SpO2 01/19/18 1255 98 %     Weight 01/19/18 1258 180 lb (81.6 kg)     Height 01/19/18 1258 5\' 2"  (1.575 m)     Head Circumference --      Peak Flow --      Pain Score 01/19/18 1257 10     Pain Loc --      Pain Edu? --      Excl. in GC? --     Constitutional: Alert and oriented. Answers questions appropriately.  She is chronically ill-appearing but comfortable and lying without any distress in the stretcher. Eyes: Conjunctivae are normal.  EOMI. PERRLA.  No vertical or horizontal nystagmus.  No scleral icterus. Head: Atraumatic. Nose: No congestion/rhinnorhea. Mouth/Throat: Mucous membranes are moist.  Neck: No stridor.  Supple.  No JVD.  No meningismus. Cardiovascular: Slow rate, regular rhythm. No murmurs, rubs or gallops.  Respiratory: Normal respiratory effort.  No accessory muscle use or retractions. Lungs CTAB.  No wheezes, rales or ronchi. Gastrointestinal: Obese.  Soft, nontender and nondistended.  No guarding or rebound.  No peritoneal signs. Musculoskeletal: No midline C or T-spine tenderness to palpation, step-offs or deformities.  The patient does have some diffuse L4-L6 midline tenderness to palpation without step-offs or deformities.  Pelvis is stable.  No evidence of extremity injuries.  No LE edema. No ttp in the calves or palpable cords.  Negative Homan's sign. Neurologic:  A&Ox3.  Speech is clear.  Face and smile are symmetric.  EOMI.  Moves all extremities well. Skin:  Skin is warm, dry and intact. No rash noted. Psychiatric: Mood and affect are normal. ____________________________________________   LABS (all labs ordered are listed, but  only abnormal results are displayed)  Labs Reviewed - No data to display ____________________________________________  EKG  ED ECG REPORT I, Rockne Menghini, the attending physician, personally viewed and interpreted this ECG.   Date: 01/19/2018  EKG Time: 1404  Rate: 55  Rhythm: sinus bradycardia  Axis: normal  Intervals:none  ST&T Change: No STEMI  ____________________________________________  RADIOLOGY  No results found.  ____________________________________________   PROCEDURES  Procedure(s) performed: None  Procedures  Critical Care performed: No ____________________________________________   INITIAL IMPRESSION / ASSESSMENT AND PLAN / ED COURSE  Pertinent labs & imaging results that were available during my care of the patient were reviewed by me and considered in my medical decision making (see chart for details).  57 y.o. female with a history of multiple medical and psychiatric illnesses, including prior evaluations for seizure-like activities that did not find true epilepsy, presenting for seizure-like episode with syncope.  From a trauma standpoint, the patient does have some low back  pain and will get plain x-rays for that.  I do not see any evidence of spinal cord compression or cauda equina syndrome.  The patient does have a mild headache, which is typical for her after having these episodes, and she does not think she hit her head.  A CT scan is not warranted at this time we will reevaluate the patient after symptom medic treatment and continue to monitor her neurologic status.  Basic laboratory studies have also been ordered.  Plan reevaluation for final disposition.  ____________________________________________  FINAL CLINICAL IMPRESSION(S) / ED DIAGNOSES  Final diagnoses:  None         NEW MEDICATIONS STARTED DURING THIS VISIT:  New Prescriptions   No medications on file      Rockne MenghiniNorman, Anne-Caroline, MD 01/19/18 1539

## 2018-01-19 NOTE — ED Notes (Signed)
Attempted IV access x3. Unsuccessful. Sharma CovertNorman MD notified of need of IV US.

## 2018-01-19 NOTE — ED Notes (Signed)
Patient transported to radiology at this time. 

## 2018-01-21 LAB — URINE CULTURE

## 2018-01-22 NOTE — Progress Notes (Signed)
Patient was seen in the ED on 01/19/18. Urine cx from that visit resulted w/ >100,000 colonies klebsiella resistant to bactrim. Pt was d/c on bactrim. Pt w/ PCN allergy (rash) and no documented hx of cephalosporin use.  Cipro 500mg  BID x 7 days was prescribed-authorized by Dr. Mayford KnifeWilliams. Called group home, spoke with [place]. States they use [facility]. He was informed to stop the bactrim. Rx for cipro 500mg  BID x 7. Stop bactrim. was called into pharmacy.   Olene FlossMelissa D Javionna Leder, Pharm.D, BCPS Clinical Pharmacist

## 2018-02-23 ENCOUNTER — Other Ambulatory Visit: Payer: Self-pay | Admitting: Urology

## 2018-03-09 ENCOUNTER — Encounter: Payer: Self-pay | Admitting: Podiatry

## 2018-03-09 ENCOUNTER — Ambulatory Visit (INDEPENDENT_AMBULATORY_CARE_PROVIDER_SITE_OTHER): Payer: Medicare Other | Admitting: Podiatry

## 2018-03-09 DIAGNOSIS — E119 Type 2 diabetes mellitus without complications: Secondary | ICD-10-CM

## 2018-03-09 DIAGNOSIS — M79609 Pain in unspecified limb: Secondary | ICD-10-CM

## 2018-03-09 DIAGNOSIS — B351 Tinea unguium: Secondary | ICD-10-CM | POA: Diagnosis not present

## 2018-03-09 NOTE — Progress Notes (Signed)
Complaint:  Visit Type: Patient returns to my office for continued preventative foot care services. Complaint: Patient states" my nails have grown long and thick and become painful to walk and wear shoes" Patient has been diagnosed with DM and now takes metformin.. The patient presents for preventative foot care services. No changes to ROS  Podiatric Exam: Vascular: dorsalis pedis and posterior tibial pulses are palpable bilateral. Capillary return is immediate. Temperature gradient is WNL. Skin turgor WNL  Sensorium: Normal Semmes Weinstein monofilament test. Normal tactile sensation bilaterally. Nail Exam: Pt has thick disfigured discolored nails with subungual debris noted bilateral entire nail hallux through fifth toenails Ulcer Exam: There is no evidence of ulcer or pre-ulcerative changes or infection. Orthopedic Exam: Muscle tone and strength are WNL. No limitations in general ROM. No crepitus or effusions noted. Foot type and digits show no abnormalities. Bony prominences are unremarkable. Dorsal  DJD  B/L Skin: No Porokeratosis. No infection or ulcers  Diagnosis:  Onychomycosis, , Pain in right toe, pain in left toes  Treatment & Plan Procedures and Treatment: Consent by patient was obtained for treatment procedures.   Debridement of mycotic and hypertrophic toenails, 1 through 5 bilateral and clearing of subungual debris. No ulceration, no infection noted.  Return Visit-Office Procedure: Patient instructed to return to the office for a follow up visit 3 months for continued evaluation and treatment.    Zoe Goonan DPM 

## 2018-06-11 ENCOUNTER — Ambulatory Visit (INDEPENDENT_AMBULATORY_CARE_PROVIDER_SITE_OTHER): Payer: Medicare Other | Admitting: Podiatry

## 2018-06-11 ENCOUNTER — Encounter: Payer: Self-pay | Admitting: Podiatry

## 2018-06-11 DIAGNOSIS — M79609 Pain in unspecified limb: Secondary | ICD-10-CM | POA: Diagnosis not present

## 2018-06-11 DIAGNOSIS — E119 Type 2 diabetes mellitus without complications: Secondary | ICD-10-CM

## 2018-06-11 DIAGNOSIS — B351 Tinea unguium: Secondary | ICD-10-CM | POA: Diagnosis not present

## 2018-06-11 NOTE — Progress Notes (Signed)
Complaint:  Visit Type: Patient returns to my office for continued preventative foot care services. Complaint: Patient states" my nails have grown long and thick and become painful to walk and wear shoes" Patient has been diagnosed with DM and now takes metformin.. The patient presents for preventative foot care services. No changes to ROS  Podiatric Exam: Vascular: dorsalis pedis and posterior tibial pulses are palpable bilateral. Capillary return is immediate. Temperature gradient is WNL. Skin turgor WNL  Sensorium: Normal Semmes Weinstein monofilament test. Normal tactile sensation bilaterally. Nail Exam: Pt has thick disfigured discolored nails with subungual debris noted bilateral entire nail hallux through fifth toenails Ulcer Exam: There is no evidence of ulcer or pre-ulcerative changes or infection. Orthopedic Exam: Muscle tone and strength are WNL. No limitations in general ROM. No crepitus or effusions noted. Foot type and digits show no abnormalities. Bony prominences are unremarkable. Dorsal  DJD  B/L Skin: No Porokeratosis. No infection or ulcers  Diagnosis:  Onychomycosis, , Pain in right toe, pain in left toes  Treatment & Plan Procedures and Treatment: Consent by patient was obtained for treatment procedures.   Debridement of mycotic and hypertrophic toenails, 1 through 5 bilateral and clearing of subungual debris. No ulceration, no infection noted.  Return Visit-Office Procedure: Patient instructed to return to the office for a follow up visit 3 months for continued evaluation and treatment.    Zoriana Oats DPM 

## 2018-07-01 ENCOUNTER — Other Ambulatory Visit: Payer: Self-pay | Admitting: Urology

## 2018-09-10 ENCOUNTER — Ambulatory Visit (INDEPENDENT_AMBULATORY_CARE_PROVIDER_SITE_OTHER): Payer: Medicare Other | Admitting: Podiatry

## 2018-09-10 ENCOUNTER — Encounter: Payer: Self-pay | Admitting: Podiatry

## 2018-09-10 DIAGNOSIS — E119 Type 2 diabetes mellitus without complications: Secondary | ICD-10-CM

## 2018-09-10 DIAGNOSIS — B351 Tinea unguium: Secondary | ICD-10-CM

## 2018-09-10 DIAGNOSIS — M79609 Pain in unspecified limb: Secondary | ICD-10-CM

## 2018-09-10 NOTE — Progress Notes (Addendum)
Complaint:  Visit Type: Patient returns to my office for continued preventative foot care services. Complaint: Patient states" my nails have grown long and thick and become painful to walk and wear shoes" Patient has been diagnosed with DM and now takes metformin.. The patient presents for preventative foot care services. No changes to ROS  Podiatric Exam: Vascular: dorsalis pedis and posterior tibial pulses are palpable bilateral. Capillary return is immediate. Temperature gradient is WNL. Skin turgor WNL  Sensorium: Normal Semmes Weinstein monofilament test. Normal tactile sensation bilaterally. Nail Exam: Pt has thick disfigured discolored nails with subungual debris noted bilateral entire nail hallux through fifth toenails Ulcer Exam: There is no evidence of ulcer or pre-ulcerative changes or infection. Orthopedic Exam: Muscle tone and strength are WNL. No limitations in general ROM. No crepitus or effusions noted. Foot type and digits show no abnormalities. Bony prominences are unremarkable. Dorsal  DJD  B/L Skin: No Porokeratosis. No infection or ulcers  Diagnosis:  Onychomycosis, , Pain in right toe, pain in left toes  Treatment & Plan Procedures and Treatment: Consent by patient was obtained for treatment procedures.   Debridement of mycotic and hypertrophic toenails, 1 through 5 bilateral and clearing of subungual debris. No ulceration, no infection noted. ABN signed for 2019. Return Visit-Office Procedure: Patient instructed to return to the office for a follow up visit 3 months for continued evaluation and treatment.    Helane GuntherGregory Derren Suydam DPM

## 2018-09-24 ENCOUNTER — Other Ambulatory Visit: Payer: Self-pay | Admitting: Urology

## 2018-10-26 ENCOUNTER — Inpatient Hospital Stay
Admission: EM | Admit: 2018-10-26 | Discharge: 2018-10-30 | DRG: 101 | Disposition: A | Discharge status: Home / Self Care | Payer: Medicare Other | Attending: Internal Medicine | Admitting: Internal Medicine

## 2018-10-26 ENCOUNTER — Other Ambulatory Visit: Payer: Self-pay

## 2018-10-26 ENCOUNTER — Encounter: Payer: Self-pay | Admitting: Emergency Medicine

## 2018-10-26 DIAGNOSIS — Z79899 Other long term (current) drug therapy: Secondary | ICD-10-CM | POA: Diagnosis not present

## 2018-10-26 DIAGNOSIS — R0902 Hypoxemia: Secondary | ICD-10-CM | POA: Diagnosis present

## 2018-10-26 DIAGNOSIS — K2900 Acute gastritis without bleeding: Secondary | ICD-10-CM | POA: Diagnosis present

## 2018-10-26 DIAGNOSIS — E785 Hyperlipidemia, unspecified: Secondary | ICD-10-CM | POA: Diagnosis present

## 2018-10-26 DIAGNOSIS — E039 Hypothyroidism, unspecified: Secondary | ICD-10-CM | POA: Diagnosis present

## 2018-10-26 DIAGNOSIS — F419 Anxiety disorder, unspecified: Secondary | ICD-10-CM | POA: Diagnosis present

## 2018-10-26 DIAGNOSIS — R631 Polydipsia: Secondary | ICD-10-CM | POA: Diagnosis present

## 2018-10-26 DIAGNOSIS — F0390 Unspecified dementia without behavioral disturbance: Secondary | ICD-10-CM | POA: Diagnosis present

## 2018-10-26 DIAGNOSIS — I5030 Unspecified diastolic (congestive) heart failure: Secondary | ICD-10-CM | POA: Diagnosis present

## 2018-10-26 DIAGNOSIS — F319 Bipolar disorder, unspecified: Secondary | ICD-10-CM | POA: Diagnosis present

## 2018-10-26 DIAGNOSIS — E871 Hypo-osmolality and hyponatremia: Secondary | ICD-10-CM | POA: Diagnosis present

## 2018-10-26 DIAGNOSIS — Z880 Allergy status to penicillin: Secondary | ICD-10-CM | POA: Diagnosis not present

## 2018-10-26 DIAGNOSIS — Z888 Allergy status to other drugs, medicaments and biological substances status: Secondary | ICD-10-CM | POA: Diagnosis not present

## 2018-10-26 DIAGNOSIS — N179 Acute kidney failure, unspecified: Secondary | ICD-10-CM | POA: Diagnosis present

## 2018-10-26 DIAGNOSIS — G40909 Epilepsy, unspecified, not intractable, without status epilepticus: Secondary | ICD-10-CM | POA: Diagnosis present

## 2018-10-26 DIAGNOSIS — Z79890 Hormone replacement therapy (postmenopausal): Secondary | ICD-10-CM

## 2018-10-26 DIAGNOSIS — R509 Fever, unspecified: Secondary | ICD-10-CM

## 2018-10-26 DIAGNOSIS — R4587 Impulsiveness: Secondary | ICD-10-CM | POA: Diagnosis present

## 2018-10-26 DIAGNOSIS — Z87891 Personal history of nicotine dependence: Secondary | ICD-10-CM | POA: Diagnosis not present

## 2018-10-26 DIAGNOSIS — Z9049 Acquired absence of other specified parts of digestive tract: Secondary | ICD-10-CM

## 2018-10-26 DIAGNOSIS — Z7951 Long term (current) use of inhaled steroids: Secondary | ICD-10-CM

## 2018-10-26 DIAGNOSIS — I110 Hypertensive heart disease with heart failure: Secondary | ICD-10-CM | POA: Diagnosis present

## 2018-10-26 DIAGNOSIS — J449 Chronic obstructive pulmonary disease, unspecified: Secondary | ICD-10-CM | POA: Diagnosis present

## 2018-10-26 DIAGNOSIS — Z8249 Family history of ischemic heart disease and other diseases of the circulatory system: Secondary | ICD-10-CM

## 2018-10-26 DIAGNOSIS — R569 Unspecified convulsions: Secondary | ICD-10-CM | POA: Diagnosis present

## 2018-10-26 DIAGNOSIS — E119 Type 2 diabetes mellitus without complications: Secondary | ICD-10-CM | POA: Diagnosis present

## 2018-10-26 DIAGNOSIS — E875 Hyperkalemia: Secondary | ICD-10-CM | POA: Diagnosis not present

## 2018-10-26 DIAGNOSIS — Z9981 Dependence on supplemental oxygen: Secondary | ICD-10-CM

## 2018-10-26 LAB — COMPREHENSIVE METABOLIC PANEL
ALT: 13 U/L (ref 0–44)
AST: 24 U/L (ref 15–41)
Albumin: 3.2 g/dL — ABNORMAL LOW (ref 3.5–5.0)
Alkaline Phosphatase: 82 U/L (ref 38–126)
Anion gap: 8 (ref 5–15)
BUN: 11 mg/dL (ref 6–20)
CO2: 21 mmol/L — ABNORMAL LOW (ref 22–32)
Calcium: 8.1 mg/dL — ABNORMAL LOW (ref 8.9–10.3)
Chloride: 89 mmol/L — ABNORMAL LOW (ref 98–111)
Creatinine, Ser: 1.03 mg/dL — ABNORMAL HIGH (ref 0.44–1.00)
GFR calc Af Amer: >60 mL/min (ref >60)
GFR calc non Af Amer: >60 mL/min (ref >60)
Glucose, Bld: 141 mg/dL — ABNORMAL HIGH (ref 70–99)
Potassium: 4.5 mmol/L (ref 3.5–5.1)
SODIUM: 118 mmol/L — AB (ref 135–145)
Total Bilirubin: 0.8 mg/dL (ref 0.3–1.2)
Total Protein: 5.7 g/dL — ABNORMAL LOW (ref 6.5–8.1)

## 2018-10-26 LAB — URINALYSIS, COMPLETE (UACMP) WITH MICROSCOPIC
BILIRUBIN URINE: NEGATIVE
Bacteria, UA: NONE SEEN
Glucose, UA: >=500 mg/dL — AB
Hgb urine dipstick: NEGATIVE
Ketones, ur: NEGATIVE mg/dL
Leukocytes, UA: NEGATIVE
NITRITE: NEGATIVE
Protein, ur: NEGATIVE mg/dL
SPECIFIC GRAVITY, URINE: 1.008 (ref 1.005–1.030)
pH: 6 (ref 5.0–8.0)

## 2018-10-26 LAB — SODIUM
Sodium: 120 mmol/L — ABNORMAL LOW (ref 135–145)
Sodium: 125 mmol/L — ABNORMAL LOW (ref 135–145)
Sodium: 128 mmol/L — ABNORMAL LOW (ref 135–145)

## 2018-10-26 LAB — CBC WITH DIFFERENTIAL/PLATELET
ABS IMMATURE GRANULOCYTES: 0.1 10*3/uL — AB (ref 0.00–0.07)
Basophils Absolute: 0 10*3/uL (ref 0.0–0.1)
Basophils Relative: 0 %
Eosinophils Absolute: 0.2 10*3/uL (ref 0.0–0.5)
Eosinophils Relative: 2 %
HCT: 32.8 % — ABNORMAL LOW (ref 36.0–46.0)
Hemoglobin: 10 g/dL — ABNORMAL LOW (ref 12.0–15.0)
IMMATURE GRANULOCYTES: 1 %
Lymphocytes Relative: 11 %
Lymphs Abs: 1 10*3/uL (ref 0.7–4.0)
MCH: 23.9 pg — AB (ref 26.0–34.0)
MCHC: 30.5 g/dL (ref 30.0–36.0)
MCV: 78.5 fL — ABNORMAL LOW (ref 80.0–100.0)
MONO ABS: 0.5 10*3/uL (ref 0.1–1.0)
MONOS PCT: 6 %
NEUTROS ABS: 7.8 10*3/uL — AB (ref 1.7–7.7)
Neutrophils Relative %: 80 %
Platelets: 162 10*3/uL (ref 150–400)
RBC: 4.18 MIL/uL (ref 3.87–5.11)
RDW: 16.7 % — ABNORMAL HIGH (ref 11.5–15.5)
WBC: 9.7 10*3/uL (ref 4.0–10.5)
nRBC: 0 % (ref 0.0–0.2)

## 2018-10-26 LAB — SODIUM, URINE, RANDOM: Sodium, Ur: 22 mmol/L

## 2018-10-26 LAB — CBC
HEMATOCRIT: 33.4 % — AB (ref 36.0–46.0)
HEMOGLOBIN: 10.3 g/dL — AB (ref 12.0–15.0)
MCH: 23.8 pg — ABNORMAL LOW (ref 26.0–34.0)
MCHC: 30.8 g/dL (ref 30.0–36.0)
MCV: 77.1 fL — ABNORMAL LOW (ref 80.0–100.0)
Platelets: 125 10*3/uL — ABNORMAL LOW (ref 150–400)
RBC: 4.33 MIL/uL (ref 3.87–5.11)
RDW: 16.2 % — ABNORMAL HIGH (ref 11.5–15.5)
WBC: 9.3 10*3/uL (ref 4.0–10.5)
nRBC: 0 % (ref 0.0–0.2)

## 2018-10-26 LAB — MRSA PCR SCREENING: MRSA by PCR: NEGATIVE

## 2018-10-26 LAB — CREATININE, SERUM
Creatinine, Ser: 1.05 mg/dL — ABNORMAL HIGH (ref 0.44–1.00)
GFR calc non Af Amer: 59 mL/min — ABNORMAL LOW (ref >60)

## 2018-10-26 LAB — OSMOLALITY, URINE: Osmolality, Ur: 152 mOsm/kg — ABNORMAL LOW (ref 300–900)

## 2018-10-26 LAB — TROPONIN I: Troponin I: <0.03 ng/mL (ref <0.03)

## 2018-10-26 LAB — GLUCOSE, CAPILLARY
GLUCOSE-CAPILLARY: 123 mg/dL — AB (ref 70–99)
Glucose-Capillary: 125 mg/dL — ABNORMAL HIGH (ref 70–99)
Glucose-Capillary: 107 mg/dL — ABNORMAL HIGH (ref 70–99)

## 2018-10-26 LAB — OSMOLALITY: Osmolality: 254 mOsm/kg — ABNORMAL LOW (ref 275–295)

## 2018-10-26 MED ORDER — FLUTICASONE PROPIONATE 50 MCG/ACT NA SUSP
1.0000 | Freq: Every day | NASAL | Status: DC
  Filled 2018-10-26: qty 16

## 2018-10-26 MED ORDER — LEVOTHYROXINE SODIUM 100 MCG PO TABS
100.0000 ug | ORAL_TABLET | Freq: Every day | ORAL | Status: DC
  Administered 2018-10-27 – 2018-10-30 (×4): 100 ug via ORAL
  Filled 2018-10-26 (×4): qty 1

## 2018-10-26 MED ORDER — OMEGA-3-ACID ETHYL ESTERS 1 G PO CAPS
1.0000 g | ORAL_CAPSULE | Freq: Two times a day (BID) | ORAL | Status: DC
  Administered 2018-10-26 – 2018-10-30 (×8): 1 g via ORAL
  Filled 2018-10-26 (×8): qty 1

## 2018-10-26 MED ORDER — ONDANSETRON HCL 4 MG/2ML IJ SOLN
4.0000 mg | Freq: Four times a day (QID) | INTRAMUSCULAR | Status: DC | PRN
  Administered 2018-10-26: 4 mg via INTRAVENOUS
  Filled 2018-10-26: qty 2

## 2018-10-26 MED ORDER — BUSPIRONE HCL 15 MG PO TABS
30.0000 mg | ORAL_TABLET | Freq: Two times a day (BID) | ORAL | Status: DC
  Administered 2018-10-26 – 2018-10-30 (×9): 30 mg via ORAL
  Filled 2018-10-26 (×10): qty 2

## 2018-10-26 MED ORDER — DIPHENHYDRAMINE HCL 25 MG PO CAPS
25.0000 mg | ORAL_CAPSULE | Freq: Every evening | ORAL | Status: DC | PRN
  Administered 2018-10-27 – 2018-10-28 (×2): 25 mg via ORAL
  Filled 2018-10-26 (×2): qty 1

## 2018-10-26 MED ORDER — SODIUM CHLORIDE 0.9 % IV SOLN
Freq: Once | INTRAVENOUS | Status: AC
  Administered 2018-10-26: 10:00:00 via INTRAVENOUS

## 2018-10-26 MED ORDER — SODIUM CHLORIDE 3 % IV SOLN
INTRAVENOUS | Status: DC
  Filled 2018-10-26: qty 500

## 2018-10-26 MED ORDER — ACETAMINOPHEN 500 MG PO TABS
500.0000 mg | ORAL_TABLET | Freq: Four times a day (QID) | ORAL | Status: DC | PRN
  Administered 2018-10-26 – 2018-10-27 (×5): 500 mg via ORAL
  Administered 2018-10-28 – 2018-10-30 (×5): 1000 mg via ORAL
  Filled 2018-10-26: qty 1
  Filled 2018-10-26 (×2): qty 2
  Filled 2018-10-26: qty 1
  Filled 2018-10-26 (×3): qty 2
  Filled 2018-10-26 (×3): qty 1

## 2018-10-26 MED ORDER — GUAIFENESIN 100 MG/5ML PO SOLN
5.0000 mL | ORAL | Status: DC | PRN
  Administered 2018-10-27 – 2018-10-28 (×2): 100 mg via ORAL
  Filled 2018-10-26 (×3): qty 5

## 2018-10-26 MED ORDER — LITHIUM CARBONATE 150 MG PO CAPS
150.0000 mg | ORAL_CAPSULE | Freq: Two times a day (BID) | ORAL | Status: DC
  Administered 2018-10-26 – 2018-10-30 (×8): 150 mg via ORAL
  Filled 2018-10-26 (×9): qty 1

## 2018-10-26 MED ORDER — LISINOPRIL 2.5 MG PO TABS
2.5000 mg | ORAL_TABLET | Freq: Every day | ORAL | Status: DC
  Administered 2018-10-28: 2.5 mg via ORAL
  Filled 2018-10-26: qty 1

## 2018-10-26 MED ORDER — PRAVASTATIN SODIUM 20 MG PO TABS
40.0000 mg | ORAL_TABLET | Freq: Every day | ORAL | Status: DC
  Administered 2018-10-27 – 2018-10-30 (×4): 40 mg via ORAL
  Filled 2018-10-26 (×4): qty 2

## 2018-10-26 MED ORDER — ROPINIROLE HCL 1 MG PO TABS
1.0000 mg | ORAL_TABLET | Freq: Every day | ORAL | Status: DC
  Administered 2018-10-26 – 2018-10-29 (×4): 1 mg via ORAL
  Filled 2018-10-26 (×5): qty 1

## 2018-10-26 MED ORDER — IBUPROFEN 400 MG PO TABS
600.0000 mg | ORAL_TABLET | Freq: Once | ORAL | Status: AC
  Administered 2018-10-26: 600 mg via ORAL
  Filled 2018-10-26: qty 2

## 2018-10-26 MED ORDER — BENZTROPINE MESYLATE 1 MG PO TABS
1.0000 mg | ORAL_TABLET | Freq: Two times a day (BID) | ORAL | Status: DC
  Administered 2018-10-26 – 2018-10-30 (×9): 1 mg via ORAL
  Filled 2018-10-26 (×11): qty 1

## 2018-10-26 MED ORDER — RISPERIDONE 3 MG PO TABS
3.0000 mg | ORAL_TABLET | Freq: Every day | ORAL | Status: DC
  Administered 2018-10-26 – 2018-10-29 (×4): 3 mg via ORAL
  Filled 2018-10-26 (×5): qty 1

## 2018-10-26 MED ORDER — INSULIN ASPART 100 UNIT/ML ~~LOC~~ SOLN
0.0000 [IU] | Freq: Three times a day (TID) | SUBCUTANEOUS | Status: DC
  Administered 2018-10-26 – 2018-10-27 (×2): 1 [IU] via SUBCUTANEOUS
  Administered 2018-10-27: 2 [IU] via SUBCUTANEOUS
  Administered 2018-10-28 (×2): 1 [IU] via SUBCUTANEOUS
  Administered 2018-10-29 – 2018-10-30 (×2): 2 [IU] via SUBCUTANEOUS
  Filled 2018-10-26 (×7): qty 1

## 2018-10-26 MED ORDER — TAMSULOSIN HCL 0.4 MG PO CAPS
0.4000 mg | ORAL_CAPSULE | Freq: Every day | ORAL | Status: DC
  Administered 2018-10-27 – 2018-10-30 (×4): 0.4 mg via ORAL
  Filled 2018-10-26 (×4): qty 1

## 2018-10-26 MED ORDER — ONDANSETRON HCL 4 MG PO TABS: 4.0000 mg | ORAL_TABLET | Freq: Four times a day (QID) | ORAL | Status: DC | PRN

## 2018-10-26 MED ORDER — ENOXAPARIN SODIUM 40 MG/0.4ML ~~LOC~~ SOLN
40.0000 mg | SUBCUTANEOUS | Status: DC
  Administered 2018-10-26: 40 mg via SUBCUTANEOUS
  Filled 2018-10-26: qty 0.4

## 2018-10-26 MED ORDER — LORATADINE 10 MG PO TABS
10.0000 mg | ORAL_TABLET | Freq: Every day | ORAL | Status: DC
  Administered 2018-10-27 – 2018-10-30 (×4): 10 mg via ORAL
  Filled 2018-10-26 (×4): qty 1

## 2018-10-26 MED ORDER — MOMETASONE FURO-FORMOTEROL FUM 100-5 MCG/ACT IN AERO
2.0000 | INHALATION_SPRAY | Freq: Two times a day (BID) | RESPIRATORY_TRACT | Status: DC
  Administered 2018-10-26 – 2018-10-30 (×9): 2 via RESPIRATORY_TRACT
  Filled 2018-10-26: qty 8.8

## 2018-10-26 MED ORDER — CARVEDILOL 6.25 MG PO TABS
3.1250 mg | ORAL_TABLET | Freq: Two times a day (BID) | ORAL | Status: DC
  Administered 2018-10-26 – 2018-10-30 (×6): 3.125 mg via ORAL
  Filled 2018-10-26 (×6): qty 1

## 2018-10-26 MED ORDER — INSULIN ASPART 100 UNIT/ML ~~LOC~~ SOLN
0.0000 [IU] | Freq: Every day | SUBCUTANEOUS | Status: DC
  Administered 2018-10-28 – 2018-10-29 (×2): 2 [IU] via SUBCUTANEOUS
  Filled 2018-10-26 (×2): qty 1

## 2018-10-26 MED ORDER — SODIUM CHLORIDE 3 % IV SOLN
INTRAVENOUS | Status: DC
  Administered 2018-10-26: 50 mL/h via INTRAVENOUS
  Filled 2018-10-26 (×3): qty 500

## 2018-10-26 MED ORDER — ALBUTEROL SULFATE (2.5 MG/3ML) 0.083% IN NEBU
2.5000 mg | INHALATION_SOLUTION | RESPIRATORY_TRACT | Status: DC | PRN
  Administered 2018-10-26 – 2018-10-27 (×4): 2.5 mg via RESPIRATORY_TRACT
  Filled 2018-10-26 (×5): qty 3

## 2018-10-26 NOTE — Consult Note (Signed)
Name: Tammy [name] MRN: [id number] DOB: August 11, 1961     CONSULTATION DATE: 10/26/2018  REFERRING MD :  Ramonita Lab, MD  CHIEF COMPLAINT:  Seizure-like activity  STUDIES:  1/20 - Arrived at ED via EMS, Na 118 1/20 - Transferred to ICU Stepdown status  HISTORY OF PRESENT ILLNESS:  Patient is a 58 y.o. female with medical history of CHF, COPD, Bipolar 1 disorder, anxiety, asthma, epilepsy, dementia, presents to the ED with seizure-like activity. She reports her whole body was shaking and had headache at around 5AM this morning, and it lasted about 15 minutes. She reports remembers everything during the episode, no LOC or any other unusual activities, and was able to communicate with another female who lives with her in a group home. She reports the female called the ambulance. Initial labs drawn in ED show sodium of 118, and patient denies other complaints. Patient is then transferred to [facility] for further evaluation. She reports on chronic home O2, and had similar episode that lasted 30 minutes about a month ago. She reports started on new med for seizure but unable to provide the name of the medication.    PAST MEDICAL HISTORY :   has a past medical history of Anxiety, Asthma, Bipolar 1 disorder (HCC), CHF (congestive heart failure) (HCC), COPD (chronic obstructive pulmonary disease) (HCC), Dementia (HCC), Epilepsy (HCC), and Seizures (HCC).  has a past surgical history that includes hernia reapir and Appendectomy. Prior to Admission medications   Medication Sig Start Date End Date Taking? Authorizing Provider  acetaminophen (TYLENOL) 500 MG tablet Take 500-1,000 mg by mouth daily as needed.    Yes [provider]  albuterol (PROVENTIL HFA;VENTOLIN HFA) 108 (90 BASE) MCG/ACT inhaler Inhale 4-6 puffs by mouth every 4 hours as needed for wheezing, cough, and/or shortness of breath 07/01/15  Yes Loleta Rose, MD  budesonide-formoterol [facility]) 80-4.5 MCG/ACT inhaler Inhale 2  puffs into the lungs 2 (two) times daily.   Yes [provider]  busPIRone (BUSPAR) 30 MG tablet Take 30 mg by mouth 2 (two) times daily.    Yes [provider]  carvedilol (COREG) 3.125 MG tablet Take 3.125 mg by mouth 2 (two) times daily with a meal.    Yes [provider]  cetirizine (ZYRTEC) 10 MG tablet Take 10 mg by mouth daily.   Yes [provider]  Cholecalciferol (VITAMIN D3) 5000 units CAPS Take 1 capsule by mouth daily.   Yes [provider]  diphenhydramine-acetaminophen (TYLENOL PM) 25-500 MG TABS tablet Take 1 tablet by mouth at bedtime as needed.   Yes [provider]  empagliflozin (JARDIANCE) 10 MG TABS tablet Take 10 mg by mouth daily.    Yes [provider]  fluticasone (FLONASE) 50 MCG/ACT nasal spray Place 1 spray into both nostrils daily.    Yes [provider]  Levothyroxine Sodium 100 MCG CAPS Take 100 mcg by mouth daily before breakfast.    Yes [provider]  lisinopril (PRINIVIL,ZESTRIL) 2.5 MG tablet Take 2.5 mg by mouth daily.   Yes [provider]  metFORMIN (GLUCOPHAGE) 500 MG tablet Take 500 mg by mouth 2 (two) times daily with a meal.   Yes [provider]  Omega-3 Fatty Acids (FISH OIL) 1000 MG CAPS Take 1 capsule by mouth 2 (two) times daily.   Yes [provider]  pravastatin (PRAVACHOL) 40 MG tablet Take 40 mg by mouth daily.   Yes [provider]  risperiDONE (RISPERDAL) 3 MG tablet Take  3 mg by mouth at bedtime.    Yes [provider]  rOPINIRole (REQUIP) 1 MG tablet Take 1 mg by mouth at bedtime.   Yes [provider]  tamsulosin (FLOMAX) 0.4 MG CAPS capsule Take 1 capsule (0.4 mg total) by mouth daily. 03/25/17  Yes McGowan, Carollee Herter A, PA-C  benzonatate (TESSALON) 100 MG capsule Take 100 mg by mouth 3 (three) times daily as needed for cough.    [provider]  benztropine (COGENTIN) 1 MG tablet Take 1 tablet (1 mg  total) by mouth 2 (two) times daily. Patient not taking: Reported on 01/19/2018 07/03/15 06/23/18  Emily Filbert, MD  guaiFENesin (ROBITUSSIN) 100 MG/5ML SOLN Take 5 mLs (100 mg total) by mouth every 4 (four) hours as needed for cough or to loosen phlegm. 08/09/17   Sharman Cheek, MD  lithium carbonate 150 MG capsule Take by mouth.    [provider]  loratadine (CLARITIN) 10 MG tablet Take by mouth.    [provider]  methylPREDNISolone (MEDROL) 4 MG TBPK tablet Follow package instructions for steroid taper regimen. 08/09/17   Sharman Cheek, MD  nitrofurantoin (MACRODANTIN) 100 MG capsule Take 100 mg by mouth at bedtime.    [provider]  nitrofurantoin, macrocrystal-monohydrate, (MACROBID) 100 MG capsule Take by mouth.    [provider]  sulfamethoxazole-trimethoprim (BACTRIM DS,SEPTRA DS) 800-160 MG tablet Take 1 tablet by mouth 2 (two) times daily. 01/19/18   Rockne Menghini, MD   Allergies  Allergen Reactions  . Penicillins Rash    Has patient had a PCN reaction causing immediate rash, facial/tongue/throat swelling, SOB or lightheadedness with hypotension: No Has patient had a PCN reaction causing severe rash involving mucus membranes or skin necrosis: No Has patient had a PCN reaction that required hospitalization: No Has patient had a PCN reaction occurring within the last 10 years: No If all of the above answers are "NO", then may proceed with Cephalosporin use.   . Prednisone Rash    FAMILY HISTORY:  family history includes Hypertension in her mother. SOCIAL HISTORY:  reports that she quit smoking about 20 years ago. She has never used smokeless tobacco. She reports that she does not drink alcohol or use drugs.  REVIEW OF SYSTEMS:   Review of Systems  Constitutional: Negative for chills and fever (Body shaking but not chill).  Eyes: Negative for blurred vision.  Respiratory: Positive for wheezing.   Cardiovascular:  Negative for chest pain and leg swelling.  Gastrointestinal: Negative for abdominal pain, nausea and vomiting.  Neurological: Positive for seizures (Per patient) and headaches. Negative for loss of consciousness and weakness.  All other systems reviewed and are negative.   VITAL SIGNS: Temp:  [97.5 F (36.4 C)] 97.5 F (36.4 C) (01/20 0835) Pulse Rate:  [56-71] 71 (01/20 1152) Resp:  [2-20] 2 (01/20 1152) BP: (135-153)/(68-81) 135/68 (01/20 1152) SpO2:  [94 %-96 %] 96 % (01/20 1152) Weight:  [81.6 kg] 81.6 kg (01/20 0835)  Physical Examination:  GENERAL: In no acute distress, sitting comfortably, drinking water w/o difficulties  HEAD: Normocephalic, atraumatic.  EYES: Pupils equal, round, reactive to light.  No scleral icterus.  MOUTH: Moist mucosal membrane. NECK: Supple. No JVD.  PULMONARY: +wheezing CARDIOVASCULAR: S1 and S2. Regular rate and rhythm. No murmurs, rubs, or gallops.  GASTROINTESTINAL: Soft, nontender, -distended. No masses. Positive bowel sounds. No hepatosplenomegaly.  MUSCULOSKELETAL: No swelling, clubbing, or edema.  NEUROLOGIC: Alert and awake, oriented to place SKIN: Intact,warm,dry  I personally reviewed lab work  that was obtained in last 24 hrs.   ASSESSMENT / PLAN:  Patient is a 58 y.o. female presents to ICU Stepdown for severe hyponatremia  Severe Hyponatremia with seizure-like activity  -Completed normal saline bolus -Na at 120, start 3% saline -Monitor Sodium level q4H    Jo-ku Hillery Aldoeng, PA-Student  PCCM ATTENDING ATTESTATION:  I have evaluated patient with the PA student Hillery Aldoeng, reviewed database in its entirety and discussed care plan in detail. In addition, this patient was discussed on multidisciplinary rounds. I have personally reviewed all chest radiographs discussed herein including CXRs and CT chest unless otherwise indicated  Important exam findings: Alert, RASS 0, calm, pleasant No overt seizure activity The exam is essentially  normal  Major problems addressed by PCCM team: Seizure D/O Symptomatic hyponatremia of unclear etiology Seizure this morning, resolved History of bipolar disorder on risperidone and lithium Mild AKI, on ACE inhibitor  PLAN/REC: Watch in ICU/SDU while on hypertonic saline (ordered by nephrology) When she is off of hypertonic saline, she may be safely discharged to a MedSurg floor    Billy Fischeravid Simonds, MD PCCM service Mobile [phone] Pager [phone] 10/26/2018 2:21 PM

## 2018-10-26 NOTE — ED Triage Notes (Signed)
Seizure at group home. Per EMS post ictal on arrival. Arrives awake, oriented, does not recall events but does states history. MAE, denies pain.

## 2018-10-26 NOTE — Consult Note (Signed)
MEDICATION RELATED CONSULT NOTE  Pharmacy Consult for 3% saline infusion Indication: hyponatremia  Vital Signs: Temp: 97.5 F (36.4 C) (01/20 0835) Temp Source: Oral (01/20 0835) BP: 135/68 (01/20 1152) Pulse Rate: 71 (01/20 1152) Intake/Output from previous day: No intake/output data recorded. Intake/Output from this shift: No intake/output data recorded.  Labs:  Na 01/20  0841  118 01/20 1233 120   Assessment: 58 y.o. female with a h/o COPD, CHF, bipolar disorder, anxiety, asthma, epilepsy, dementia brought into the ED with severe hyponatremia and seizure-like activity.    Goal of Therapy:  Increase Na by 4-6 mEq/L in 4-6 hours   Plan:  3% sodium chloride at 50 mL/hr. Monitor sodium every 2 hours x 2, then every 4 hours.  Lowella Bandy, PharmD 10/26/2018,1:30 PM

## 2018-10-26 NOTE — H&P (Signed)
[facility] - [place] at [facility]   PATIENT NAME: Tammy [name]    MR#:  [id number]  DATE OF BIRTH:  1960/12/28  DATE OF ADMISSION:  10/26/2018  PRIMARY CARE PHYSICIAN: Armando Gang, FNP   REQUESTING/REFERRING PHYSICIAN: [email or web address]   CHIEF COMPLAINT:  Seizure-like activity  HISTORY OF PRESENT ILLNESS:  Tammy [name]  is a 58 y.o. female with a known history of COPD, CHF, bipolar disorder, anxiety, asthma, epilepsy, dementia lives in group home is brought into the ED after she had seizure-like activity.  Patient was recently treated with antibiotics for urinary tract infection and chronically lives on 2 L of oxygen.  Patient is a poor historian.  No family members at bedside.  Patient endorsed that she had 2 episodes of vomiting today.  Resting comfortably during my examination.  No evidence of seizures or vomiting after her ER arrival  PAST MEDICAL HISTORY:   Past Medical History:  Diagnosis Date  . Anxiety   . Asthma   . Bipolar 1 disorder (HCC)   . CHF (congestive heart failure) (HCC)   . COPD (chronic obstructive pulmonary disease) (HCC)   . Dementia (HCC)   . Epilepsy (HCC)   . Seizures (HCC)     PAST SURGICAL HISTOIRY:   Past Surgical History:  Procedure Laterality Date  . APPENDECTOMY    . hernia reapir     x 3      SOCIAL HISTORY:   Social History   Tobacco Use  . Smoking status: Former Smoker    Last attempt to quit: 10/07/1998    Years since quitting: 20.0  . Smokeless tobacco: Never Used  Substance Use Topics  . Alcohol use: No    FAMILY HISTORY:   Family History  Problem Relation Age of Onset  . Hypertension Mother   . Kidney cancer Neg Hx   . Bladder Cancer Neg Hx     DRUG ALLERGIES:   Allergies  Allergen Reactions  . Penicillins Rash    Has patient had a PCN reaction causing immediate rash, facial/tongue/throat swelling, SOB or lightheadedness with hypotension: No Has patient had a PCN reaction causing  severe rash involving mucus membranes or skin necrosis: No Has patient had a PCN reaction that required hospitalization: No Has patient had a PCN reaction occurring within the last 10 years: No If all of the above answers are "NO", then may proceed with Cephalosporin use.   . Prednisone Rash    REVIEW OF SYSTEMS:  Review of system unobtainable as patient is demented  MEDICATIONS AT HOME:   Prior to Admission medications   Medication Sig Start Date End Date Taking? Authorizing Provider  acetaminophen (TYLENOL) 500 MG tablet Take 500-1,000 mg by mouth daily as needed.    Yes [provider]  albuterol (PROVENTIL HFA;VENTOLIN HFA) 108 (90 BASE) MCG/ACT inhaler Inhale 4-6 puffs by mouth every 4 hours as needed for wheezing, cough, and/or shortness of breath 07/01/15  Yes Loleta Rose, MD  budesonide-formoterol [facility]) 80-4.5 MCG/ACT inhaler Inhale 2 puffs into the lungs 2 (two) times daily.   Yes [provider]  busPIRone (BUSPAR) 30 MG tablet Take 30 mg by mouth 2 (two) times daily.    Yes [provider]  carvedilol (COREG) 3.125 MG tablet Take 3.125 mg by mouth 2 (two) times daily with a meal.    Yes [provider]  cetirizine (ZYRTEC) 10 MG tablet Take 10 mg by mouth daily.   Yes [provider]  Cholecalciferol (  VITAMIN D3) 5000 units CAPS Take 1 capsule by mouth daily.   Yes [provider]  diphenhydramine-acetaminophen (TYLENOL PM) 25-500 MG TABS tablet Take 1 tablet by mouth at bedtime as needed.   Yes [provider]  empagliflozin (JARDIANCE) 10 MG TABS tablet Take 10 mg by mouth daily.    Yes [provider]  fluticasone (FLONASE) 50 MCG/ACT nasal spray Place 1 spray into both nostrils daily.    Yes [provider]  Levothyroxine Sodium 100 MCG CAPS Take 100 mcg by mouth daily before breakfast.    Yes [provider]  lisinopril (PRINIVIL,ZESTRIL) 2.5 MG tablet Take 2.5 mg by mouth daily.    Yes [provider]  metFORMIN (GLUCOPHAGE) 500 MG tablet Take 500 mg by mouth 2 (two) times daily with a meal.   Yes [provider]  Omega-3 Fatty Acids (FISH OIL) 1000 MG CAPS Take 1 capsule by mouth 2 (two) times daily.   Yes [provider]  pravastatin (PRAVACHOL) 40 MG tablet Take 40 mg by mouth daily.   Yes [provider]  risperiDONE (RISPERDAL) 3 MG tablet Take 3 mg by mouth at bedtime.    Yes [provider]  rOPINIRole (REQUIP) 1 MG tablet Take 1 mg by mouth at bedtime.   Yes [provider]  tamsulosin (FLOMAX) 0.4 MG CAPS capsule Take 1 capsule (0.4 mg total) by mouth daily. 03/25/17  Yes McGowan, Carollee HerterShannon A, PA-C  benzonatate (TESSALON) 100 MG capsule Take 100 mg by mouth 3 (three) times daily as needed for cough.    [provider]  benztropine (COGENTIN) 1 MG tablet Take 1 tablet (1 mg total) by mouth 2 (two) times daily. Patient not taking: Reported on 01/19/2018 07/03/15 06/23/18  Emily FilbertWilliams, Jonathan E, MD  guaiFENesin (ROBITUSSIN) 100 MG/5ML SOLN Take 5 mLs (100 mg total) by mouth every 4 (four) hours as needed for cough or to loosen phlegm. 08/09/17   Sharman CheekStafford, Phillip, MD  lithium carbonate 150 MG capsule Take by mouth.    [provider]  loratadine (CLARITIN) 10 MG tablet Take by mouth.    [provider]  methylPREDNISolone (MEDROL) 4 MG TBPK tablet Follow package instructions for steroid taper regimen. 08/09/17   Sharman CheekStafford, Phillip, MD  nitrofurantoin (MACRODANTIN) 100 MG capsule Take 100 mg by mouth at bedtime.    [provider]  nitrofurantoin, macrocrystal-monohydrate, (MACROBID) 100 MG capsule Take by mouth.    [provider]  sulfamethoxazole-trimethoprim (BACTRIM DS,SEPTRA DS) 800-160 MG tablet Take 1 tablet by mouth 2 (two) times daily. 01/19/18   Rockne MenghiniNorman, Anne-Caroline, MD      VITAL SIGNS:  Blood pressure (!) 153/77, pulse 63, temperature (!) 97.5 F (36.4 C),  temperature source Oral, resp. rate 20, height 5\' 2"  (1.575 m), weight 81.6 kg, SpO2 94 %.  PHYSICAL EXAMINATION:  GENERAL:  58 y.o.-year-old patient lying in the bed with no acute distress.  EYES: Pupils equal, round, reactive to light and accommodation. No scleral icterus. Extraocular muscles intact.  HEENT: Head atraumatic, normocephalic. Oropharynx and nasopharynx clear.  NECK:  Supple, no jugular venous distention. No thyroid enlargement, no tenderness.  LUNGS: Normal breath sounds bilaterally, no wheezing, rales,rhonchi or crepitation. No use of accessory muscles of respiration.  CARDIOVASCULAR: S1, S2 normal. No murmurs, rubs, or gallops.  ABDOMEN: Soft, nontender, nondistended. Bowel sounds present.   EXTREMITIES: No pedal edema, cyanosis, or clubbing.  NEUROLOGIC: Awake, alert and oriented x2- sensation intact. Gait not checked.  PSYCHIATRIC: The patient is  alert and oriented x2 .  SKIN: No obvious rash, lesion, or ulcer.   LABORATORY PANEL:   CBC Recent Labs  Lab 10/26/18 0841  WBC 9.7  HGB 10.0*  HCT 32.8*  PLT 162   ------------------------------------------------------------------------------------------------------------------  Chemistries  Recent Labs  Lab 10/26/18 0841  NA 118*  K 4.5  CL 89*  CO2 21*  GLUCOSE 141*  BUN 11  CREATININE 1.03*  CALCIUM 8.1*  AST 24  ALT 13  ALKPHOS 82  BILITOT 0.8   ------------------------------------------------------------------------------------------------------------------  Cardiac Enzymes Recent Labs  Lab 10/26/18 0841  TROPONINI <0.03   ------------------------------------------------------------------------------------------------------------------  RADIOLOGY:  No results found.  EKG:   Orders placed or performed during the hospital encounter of 01/19/18  . ED EKG  . ED EKG  . EKG 12-Lead  . EKG 12-Lead  . EKG    IMPRESSION AND PLAN:   #Severe hyponatremia with  seizure-like activity at group  home  Admit to stepdown unit Seems to be acute hyponatremia Patient has received normal saline bolus in the ED will start her on hypertonic saline and monitor serial sodiums Serum osmolality, urine osmolality and random urine sodium Check TSH and fasting lipid panel Nephrology consult placed and discussed with Dr. Wynelle Link Consult placed to intensivist and Dr. Darrol Angel is aware Will avoid overzealous correction-goal is  to correct approximately 8 to 10 mEq/day  #Acute gastritis with nausea and vomiting PPI, IV fluids, supportive treatment  #Diabetes mellitus hold metformin and provide sliding scale insulin  #Bipolar disorder continue home medications lithium  #Dementia-continue close monitoring  #CHF no exacerbation at this time continue home medication  #COPD no exacerbation-albuterol as needed and continue home dose inhalers   DVT prophylaxis with Lovenox subcu GI prophylaxis with PPI  All the records are reviewed and case discussed with ED provider. Management plans discussed with the patient, sister and they are in agreement.  CODE STATUS: fc  TOTAL critical care TIME TAKING CARE OF THIS PATIENT: 45  minutes.   Note: This dictation was prepared with Dragon dictation along with smaller phrase technology. Any transcriptional errors that result from this process are unintentional.  Ramonita Lab M.D on 10/26/2018 at 11:24 AM  Between 7am to 6pm - Pager - [phone]  After 6pm go to [email or web address] - password [email or web address] [facility]  [facility]  Office  [phone]  CC: Primary care physician; Armando Gang, FNP

## 2018-10-26 NOTE — ED Notes (Signed)
ED TO INPATIENT HANDOFF REPORT  Name/Age/Gender Tammy [name] 58 y.o. female  Code Status Code Status History    Date Active Date Inactive Code Status Order ID Comments User Context   06/23/2017 1944 06/25/2017 1640 Full Code [id number]  Altamese Dilling, MD Inpatient   03/15/2017 1745 03/17/2017 2024 Full Code [id number]  Altamese Dilling, MD Inpatient      Home/SNF/Other   Chief Complaint seizure  Level of Care/Admitting Diagnosis ED Disposition    ED Disposition Condition Comment   Admit  Hospital Area: [facility] [[id number]]  Level of Care: Stepdown [14]  Diagnosis: Hyponatremia [037048]  Admitting Physician: Ramonita Lab [[id number]]  Attending Physician: Ramonita Lab [[id number]]  Estimated length of stay: 3 - 4 days  Certification:: I certify this patient will need inpatient services for at least 2 midnights  PT Class (Do Not Modify): Inpatient [101]  PT Acc Code (Do Not Modify): Private [1]       Medical History Past Medical History:  Diagnosis Date  . Anxiety   . Asthma   . Bipolar 1 disorder (HCC)   . CHF (congestive heart failure) (HCC)   . COPD (chronic obstructive pulmonary disease) (HCC)   . Dementia (HCC)   . Epilepsy (HCC)   . Seizures (HCC)     Allergies Allergies  Allergen Reactions  . Penicillins Rash    Has patient had a PCN reaction causing immediate rash, facial/tongue/throat swelling, SOB or lightheadedness with hypotension: No Has patient had a PCN reaction causing severe rash involving mucus membranes or skin necrosis: No Has patient had a PCN reaction that required hospitalization: No Has patient had a PCN reaction occurring within the last 10 years: No If all of the above answers are "NO", then may proceed with Cephalosporin use.   . Prednisone Rash    IV Location/Drains/Wounds Patient Lines/Drains/Airways Status   Active Line/Drains/Airways    None          Labs/Imaging Results for orders placed or  performed during the hospital encounter of 10/26/18 (from the past 48 hour(s))  CBC with Differential     Status: Abnormal   Collection Time: 10/26/18  8:41 AM  Result Value Ref Range   WBC 9.7 4.0 - 10.5 K/uL   RBC 4.18 3.87 - 5.11 MIL/uL   Hemoglobin 10.0 (L) 12.0 - 15.0 g/dL   HCT 88.9 (L) 16.9 - 45.0 %   MCV 78.5 (L) 80.0 - 100.0 fL   MCH 23.9 (L) 26.0 - 34.0 pg   MCHC 30.5 30.0 - 36.0 g/dL   RDW 38.8 (H) 82.8 - 00.3 %   Platelets 162 150 - 400 K/uL   nRBC 0.0 0.0 - 0.2 %   Neutrophils Relative % 80 %   Neutro Abs 7.8 (H) 1.7 - 7.7 K/uL   Lymphocytes Relative 11 %   Lymphs Abs 1.0 0.7 - 4.0 K/uL   Monocytes Relative 6 %   Monocytes Absolute 0.5 0.1 - 1.0 K/uL   Eosinophils Relative 2 %   Eosinophils Absolute 0.2 0.0 - 0.5 K/uL   Basophils Relative 0 %   Basophils Absolute 0.0 0.0 - 0.1 K/uL   Immature Granulocytes 1 %   Abs Immature Granulocytes 0.10 (H) 0.00 - 0.07 K/uL    Comment: Performed at [facility], [address] [place], [place] [address]  Comprehensive metabolic panel     Status: Abnormal   Collection Time: 10/26/18  8:41 AM  Result Value Ref Range   Sodium 118 (  LL) 135 - 145 mmol/L    Comment: CRITICAL RESULT CALLED TO, READ BACK BY AND VERIFIED WITH KIM Lionardo Haze @ 6144 01/290/20 [facility]    Potassium 4.5 3.5 - 5.1 mmol/L   Chloride 89 (L) 98 - 111 mmol/L   CO2 21 (L) 22 - 32 mmol/L   Glucose, Bld 141 (H) 70 - 99 mg/dL   BUN 11 6 - 20 mg/dL   Creatinine, Ser 3.15 (H) 0.44 - 1.00 mg/dL   Calcium 8.1 (L) 8.9 - 10.3 mg/dL   Total Protein 5.7 (L) 6.5 - 8.1 g/dL   Albumin 3.2 (L) 3.5 - 5.0 g/dL   AST 24 15 - 41 U/L   ALT 13 0 - 44 U/L   Alkaline Phosphatase 82 38 - 126 U/L   Total Bilirubin 0.8 0.3 - 1.2 mg/dL   GFR calc non Af Amer >60 >60 mL/min   GFR calc Af Amer >60 >60 mL/min   Anion gap 8 5 - 15    Comment: Performed at [facility], [address] [place], [place] [address]  Troponin I - Once     Status: None   Collection Time:  10/26/18  8:41 AM  Result Value Ref Range   Troponin I <0.03 <0.03 ng/mL    Comment: Performed at [facility], [address] [place], [place] [address]  Urinalysis, Complete w Microscopic     Status: Abnormal   Collection Time: 10/26/18  8:41 AM  Result Value Ref Range   Color, Urine STRAW (A) YELLOW   APPearance CLEAR (A) CLEAR   Specific Gravity, Urine 1.008 1.005 - 1.030   pH 6.0 5.0 - 8.0   Glucose, UA >=500 (A) NEGATIVE mg/dL   Hgb urine dipstick NEGATIVE NEGATIVE   Bilirubin Urine NEGATIVE NEGATIVE   Ketones, ur NEGATIVE NEGATIVE mg/dL   Protein, ur NEGATIVE NEGATIVE mg/dL   Nitrite NEGATIVE NEGATIVE   Leukocytes, UA NEGATIVE NEGATIVE   WBC, UA 0-5 0 - 5 WBC/hpf   Bacteria, UA NONE SEEN NONE SEEN   Squamous Epithelial / LPF 0-5 0 - 5   Mucus PRESENT     Comment: Performed at [facility], [address] [place], [place] [address]   No results found.  Pending Labs Unresulted Labs (From admission, onward)    Start     Ordered   10/26/18 1655  Sodium  Every 4 hours,   STAT     10/26/18 1057   10/26/18 1055  Sodium  Now then every 2 hours,   STAT     10/26/18 1057   10/26/18 1055  Osmolality  Once,   STAT     10/26/18 1057   10/26/18 1055  Osmolality, urine  Once,   STAT     10/26/18 1057   10/26/18 1055  Sodium, urine, random  Once,   STAT     10/26/18 1057          Vitals/Pain Today's Vitals   10/26/18 0835 10/26/18 1023 10/26/18 1029  BP: (!) 141/81  (!) 153/77  Pulse: (!) 56 63   Resp: 20 20   Temp: (!) 97.5 F (36.4 C)    TempSrc: Oral    SpO2: 96% 94%   Weight: 81.6 kg    Height: 5\' 2"  (1.575 m)    PainSc: 0-No pain      Isolation Precautions No active isolations  Medications Medications  sodium chloride (hypertonic) 3 % solution (has no administration in time range)  insulin aspart (  novoLOG) injection 0-9 Units (has no administration in time range)  insulin aspart (novoLOG) injection 0-5 Units (has no administration in  time range)  albuterol (PROVENTIL) (2.5 MG/3ML) 0.083% nebulizer solution 2.5 mg (has no administration in time range)  0.9 %  sodium chloride infusion ( Intravenous New Bag/Given 10/26/18 1026)    Mobility

## 2018-10-26 NOTE — ED Notes (Signed)
Flipped over in bed, pulled IV out.

## 2018-10-26 NOTE — Consult Note (Signed)
[facility]  CONSULT NOTE    Date: 10/26/2018                  Patient Name:  Tammy [name]  MRN: [id number]  DOB: June 25, 1961  Age / Sex: 58 y.o., female         PCP: Armando Gang, FNP                 Service Requesting Consult: Dr. Sung Amabile                 Reason for Consult: Hyponatremia            History of Present Illness: [name] is a 58 y.o. white female with hypertension, diabetes mellitus type II, seizure disorder, bipolar disorder, COPD, dementia, congestive heart failure, who was admitted to [facility] on 10/26/2018 for Hyponatremia [E87.1] Seizure-like activity (HCC) [R56.9]  Patient was admitted with seizures. Patient found to have serum sodium 118. Started on hypertonic saline.   Patient seems to be a poor historian. Denies any nausea or vomiting. No diarrhea. Patient endorses drinking lots of water, when asked to quantify, is 2 coke bottles worth.    Medications: Outpatient medications: Medications Prior to Admission  Medication Sig Dispense Refill Last Dose  . acetaminophen (TYLENOL) 500 MG tablet Take 500-1,000 mg by mouth daily as needed.    prn at prn  . albuterol (PROVENTIL HFA;VENTOLIN HFA) 108 (90 BASE) MCG/ACT inhaler Inhale 4-6 puffs by mouth every 4 hours as needed for wheezing, cough, and/or shortness of breath 1 Inhaler 1 prn at prn  . budesonide-formoterol (SYMBICORT) 80-4.5 MCG/ACT inhaler Inhale 2 puffs into the lungs 2 (two) times daily.   unknown at unknown  . busPIRone (BUSPAR) 30 MG tablet Take 30 mg by mouth 2 (two) times daily.    unknown at unknown  . carvedilol (COREG) 3.125 MG tablet Take 3.125 mg by mouth 2 (two) times daily with a meal.    unknown at unknown  . cetirizine (ZYRTEC) 10 MG tablet Take 10 mg by mouth daily.   unknown at unknown  . Cholecalciferol (VITAMIN D3) 5000 units CAPS Take 1 capsule by mouth daily.   unknown at unknown  . diphenhydramine-acetaminophen (TYLENOL PM) 25-500 MG TABS  tablet Take 1 tablet by mouth at bedtime as needed.   prn at prn  . empagliflozin (JARDIANCE) 10 MG TABS tablet Take 10 mg by mouth daily.    unknown at unknown  . fluticasone (FLONASE) 50 MCG/ACT nasal spray Place 1 spray into both nostrils daily.    unknown at unknown  . Levothyroxine Sodium 100 MCG CAPS Take 100 mcg by mouth daily before breakfast.    unknown at unknown  . lisinopril (PRINIVIL,ZESTRIL) 2.5 MG tablet Take 2.5 mg by mouth daily.   unknown at unknown  . metFORMIN (GLUCOPHAGE) 500 MG tablet Take 500 mg by mouth 2 (two) times daily with a meal.   unknown at unknown  . Omega-3 Fatty Acids (FISH OIL) 1000 MG CAPS Take 1 capsule by mouth 2 (two) times daily.   unknown at unknown  . pravastatin (PRAVACHOL) 40 MG tablet Take 40 mg by mouth daily.   unknown at unknown  . risperiDONE (RISPERDAL) 3 MG tablet Take 3 mg by mouth at bedtime.    unknown at unknown  . rOPINIRole (REQUIP) 1 MG tablet Take 1 mg by mouth at bedtime.   unknown at unknown  . tamsulosin (FLOMAX) 0.4 MG CAPS capsule Take  1 capsule (0.4 mg total) by mouth daily. 90 capsule 3 unknown at unknown  . benzonatate (TESSALON) 100 MG capsule Take 100 mg by mouth 3 (three) times daily as needed for cough.   Taking  . benztropine (COGENTIN) 1 MG tablet Take 1 tablet (1 mg total) by mouth 2 (two) times daily. (Patient not taking: Reported on 01/19/2018) 60 tablet 2 Not Taking at Unknown time  . guaiFENesin (ROBITUSSIN) 100 MG/5ML SOLN Take 5 mLs (100 mg total) by mouth every 4 (four) hours as needed for cough or to loosen phlegm. 120 mL 0 Taking  . lithium carbonate 150 MG capsule Take by mouth.   Taking  . loratadine (CLARITIN) 10 MG tablet Take by mouth.   Taking  . methylPREDNISolone (MEDROL) 4 MG TBPK tablet Follow package instructions for steroid taper regimen. 21 tablet 0 Taking  . nitrofurantoin (MACRODANTIN) 100 MG capsule Take 100 mg by mouth at bedtime.   Taking  . nitrofurantoin, macrocrystal-monohydrate, (MACROBID) 100  MG capsule Take by mouth.   Taking  . sulfamethoxazole-trimethoprim (BACTRIM DS,SEPTRA DS) 800-160 MG tablet Take 1 tablet by mouth 2 (two) times daily. 10 tablet 0 Taking    Current medications: Current Facility-Administered Medications  Medication Dose Route Frequency Provider Last Rate Last Dose  . acetaminophen (TYLENOL) tablet 500-1,000 mg  500-1,000 mg Oral Q6H PRN Gouru, Aruna, MD   500 mg at 10/26/18 1250  . albuterol (PROVENTIL) (2.5 MG/3ML) 0.083% nebulizer solution 2.5 mg  2.5 mg Nebulization Q4H PRN Gouru, Aruna, MD   2.5 mg at 10/26/18 1329  . benztropine (COGENTIN) tablet 1 mg  1 mg Oral BID Gouru, Aruna, MD   1 mg at 10/26/18 1418  . busPIRone (BUSPAR) tablet 30 mg  30 mg Oral BID Gouru, Aruna, MD   30 mg at 10/26/18 1417  . carvedilol (COREG) tablet 3.125 mg  3.125 mg Oral BID WC Gouru, Aruna, MD      . diphenhydrAMINE (BENADRYL) capsule 25 mg  25 mg Oral QHS PRN Gouru, Aruna, MD      . enoxaparin (LOVENOX) injection 40 mg  40 mg Subcutaneous Q24H Gouru, Aruna, MD   40 mg at 10/26/18 1422  . guaiFENesin (ROBITUSSIN) 100 MG/5ML solution 100 mg  5 mL Oral Q4H PRN Gouru, Aruna, MD      . insulin aspart (novoLOG) injection 0-5 Units  0-5 Units Subcutaneous QHS Gouru, Aruna, MD      . insulin aspart (novoLOG) injection 0-9 Units  0-9 Units Subcutaneous TID WC Gouru, Aruna, MD   1 Units at 10/26/18 1237  . [START ON 10/27/2018] levothyroxine (SYNTHROID, LEVOTHROID) tablet 100 mcg  100 mcg Oral QAC breakfast Gouru, Aruna, MD      . lisinopril (PRINIVIL,ZESTRIL) tablet 2.5 mg  2.5 mg Oral Daily Gouru, Deanna ArtisAruna, MD   Stopped at 10/26/18 1425  . lithium carbonate capsule 150 mg  150 mg Oral BID WC Gouru, Aruna, MD      . loratadine (CLARITIN) tablet 10 mg  10 mg Oral Daily Gouru, Aruna, MD      . mometasone-formoterol (DULERA) 100-5 MCG/ACT inhaler 2 puff  2 puff Inhalation BID Gouru, Aruna, MD   2 puff at 10/26/18 1420  . omega-3 acid ethyl esters (LOVAZA) capsule 1 g  1 g Oral BID Gouru,  Aruna, MD      . ondansetron (ZOFRAN) tablet 4 mg  4 mg Oral Q6H PRN Gouru, Aruna, MD       Or  . ondansetron (ZOFRAN) injection 4  mg  4 mg Intravenous Q6H PRN Gouru, Aruna, MD   4 mg at 10/26/18 1316  . pravastatin (PRAVACHOL) tablet 40 mg  40 mg Oral Daily Gouru, Aruna, MD      . risperiDONE (RISPERDAL) tablet 3 mg  3 mg Oral QHS Gouru, Aruna, MD      . rOPINIRole (REQUIP) tablet 1 mg  1 mg Oral QHS Gouru, Aruna, MD      . sodium chloride (hypertonic) 3 % solution   Intravenous Continuous Gouru, Aruna, MD 50 mL/hr at 10/26/18 1443    . tamsulosin (FLOMAX) capsule 0.4 mg  0.4 mg Oral Daily Gouru, Aruna, MD          Allergies: Allergies  Allergen Reactions  . Penicillins Rash    Has patient had a PCN reaction causing immediate rash, facial/tongue/throat swelling, SOB or lightheadedness with hypotension: No Has patient had a PCN reaction causing severe rash involving mucus membranes or skin necrosis: No Has patient had a PCN reaction that required hospitalization: No Has patient had a PCN reaction occurring within the last 10 years: No If all of the above answers are "NO", then may proceed with Cephalosporin use.   . Prednisone Rash      Past Medical History: Past Medical History:  Diagnosis Date  . Anxiety   . Asthma   . Bipolar 1 disorder (HCC)   . CHF (congestive heart failure) (HCC)   . COPD (chronic obstructive pulmonary disease) (HCC)   . Dementia (HCC)   . Epilepsy (HCC)   . Seizures (HCC)      Past Surgical History: Past Surgical History:  Procedure Laterality Date  . APPENDECTOMY    . hernia reapir     x 3       Family History: Family History  Problem Relation Age of Onset  . Hypertension Mother   . Kidney cancer Neg Hx   . Bladder Cancer Neg Hx      Social History: Social History   Socioeconomic History  . Marital status: Widowed    Spouse name: Not on file  . Number of children: Not on file  . Years of education: Not on file  . Highest  education level: Not on file  Occupational History  . Not on file  Social Needs  . Financial resource strain: Not on file  . Food insecurity:    Worry: Not on file    Inability: Not on file  . Transportation needs:    Medical: Not on file    Non-medical: Not on file  Tobacco Use  . Smoking status: Former Smoker    Last attempt to quit: 10/07/1998    Years since quitting: 20.0  . Smokeless tobacco: Never Used  Substance and Sexual Activity  . Alcohol use: No  . Drug use: No  . Sexual activity: Never  Lifestyle  . Physical activity:    Days per week: Not on file    Minutes per session: Not on file  . Stress: Not on file  Relationships  . Social connections:    Talks on phone: Not on file    Gets together: Not on file    Attends religious service: Not on file    Active member of club or organization: Not on file    Attends meetings of clubs or organizations: Not on file    Relationship status: Not on file  . Intimate partner violence:    Fear of current or ex partner: Not on file  Emotionally abused: Not on file    Physically abused: Not on file    Forced sexual activity: Not on file  Other Topics Concern  . Not on file  Social History Narrative  . Not on file     Review of Systems: Review of Systems  Unable to perform ROS: Dementia    Vital Signs: Blood pressure (!) 148/78, pulse 64, temperature (!) 97.2 F (36.2 C), temperature source Oral, resp. rate 17, height 5\' 2"  (1.575 m), weight 85.2 kg, SpO2 95 %.  Weight trends: Filed Weights   10/26/18 0835 10/26/18 1215  Weight: 81.6 kg 85.2 kg    Physical Exam: General: NAD, laying in bed  Head: Normocephalic, atraumatic. Moist oral mucosal membranes  Eyes: Anicteric, PERRL  Neck: Supple, trachea midline  Lungs:  Clear to auscultation  Heart: Regular rate and rhythm  Abdomen:  Soft, nontender,   Extremities:  no peripheral edema.  Neurologic: Nonfocal, moving all four extremities  Skin: No lesions          Lab results: Basic Metabolic Panel: Recent Labs  Lab 10/26/18 0841 10/26/18 1233 10/26/18 1430  NA 118* 120*  --   K 4.5  --   --   CL 89*  --   --   CO2 21*  --   --   GLUCOSE 141*  --   --   BUN 11  --   --   CREATININE 1.03*  --  1.05*  CALCIUM 8.1*  --   --     Liver Function Tests: Recent Labs  Lab 10/26/18 0841  AST 24  ALT 13  ALKPHOS 82  BILITOT 0.8  PROT 5.7*  ALBUMIN 3.2*   No results for input(s): LIPASE, AMYLASE in the last 168 hours. No results for input(s): AMMONIA in the last 168 hours.  CBC: Recent Labs  Lab 10/26/18 0841  WBC 9.7  NEUTROABS 7.8*  HGB 10.0*  HCT 32.8*  MCV 78.5*  PLT 162    Cardiac Enzymes: Recent Labs  Lab 10/26/18 0841  TROPONINI <0.03    BNP: Invalid input(s): POCBNP  CBG: Recent Labs  Lab 10/26/18 1215  GLUCAP 125*    Microbiology: Results for orders placed or performed during the hospital encounter of 10/26/18  MRSA PCR Screening     Status: None   Collection Time: 10/26/18 12:23 PM  Result Value Ref Range Status   MRSA by PCR NEGATIVE NEGATIVE Final    Comment:        The GeneXpert MRSA Assay (FDA approved for NASAL specimens only), is one component of a comprehensive MRSA colonization surveillance program. It is not intended to diagnose MRSA infection nor to guide or monitor treatment for MRSA infections. Performed at [facility], [address] [place], [place] [address]     Coagulation Studies: No results for input(s): LABPROT, INR in the last 72 hours.  Urinalysis: Recent Labs    10/26/18 0841  COLORURINE STRAW*  LABSPEC 1.008  PHURINE 6.0  GLUCOSEU >=500*  HGBUR NEGATIVE  BILIRUBINUR NEGATIVE  KETONESUR NEGATIVE  PROTEINUR NEGATIVE  NITRITE NEGATIVE  LEUKOCYTESUR NEGATIVE      Imaging:  No results found.   Assessment & Plan: [name] is a 58 y.o. white female with hypertension, diabetes mellitus type II, seizure disorder, bipolar  disorder, COPD, dementia, congestive heart failure, who was admitted to [facility] on 10/26/2018 for Hyponatremia  Hyponatremia leading to seizures. Started on hypertonic saline. Etiology is unclear, differential includes polydipsia (  with or without diabetes insipidus), GI losses or SIADH.  - Continue hypertonic saline until sodium >125.  - start fluid restriction - continue serial sodium checks.   LOS: 0 Cornell Bourbon 1/20/20203:10 PM

## 2018-10-26 NOTE — ED Provider Notes (Signed)
[facility]       Time seen: ----------------------------------------- 8:24 AM on 10/26/2018 -----------------------------------------   I have reviewed the triage vital signs and the nursing notes.  HISTORY   Chief Complaint No chief complaint on file.    HPI Tammy [name] is a 58 y.o. female with a history of anxiety, asthma, bipolar disorder, CHF, COPD, dementia, epilepsy, seizures who presents to the ED for seizure-like activity.  Patient arrives by ambulance with reported history of seizure.  She was incontinent.  They report she has not been acting quite herself, recently had a UTI.  No further information is available.  There were also concerns about hypoxia, patient reportedly wears 2 L of oxygen all the time.  Patient denies complaints on arrival.  Past Medical History:  Diagnosis Date  . Anxiety   . Asthma   . Bipolar 1 disorder (HCC)   . CHF (congestive heart failure) (HCC)   . COPD (chronic obstructive pulmonary disease) (HCC)   . Dementia (HCC)   . Epilepsy (HCC)   . Seizures [facility](HCC)     Patient Active Problem List   Diagnosis Date Noted  . Syncopal episodes 06/23/2017  . Acute on chronic respiratory failure with hypoxia (HCC) 03/15/2017  . COPD with acute exacerbation (HCC) 03/15/2017  . Acute on chronic respiratory failure (HCC) 03/15/2017    Past Surgical History:  Procedure Laterality Date  . APPENDECTOMY    . hernia reapir     x 3      Allergies Penicillins and Prednisone  Social History Social History   Tobacco Use  . Smoking status: Former Smoker    Last attempt to quit: 10/07/1998    Years since quitting: 20.0  . Smokeless tobacco: Never Used  Substance Use Topics  . Alcohol use: No  . Drug use: No   Review of Systems Constitutional: Negative for fever. Cardiovascular: Negative for chest pain. Respiratory: Negative for shortness of breath. Gastrointestinal: Negative for  abdominal pain, vomiting and diarrhea. Musculoskeletal: Negative for back pain. Skin: Negative for rash. Neurological: Negative for headaches, focal weakness or numbness.  All systems negative/normal/unremarkable except as stated in the HPI  ____________________________________________   PHYSICAL EXAM:  VITAL SIGNS: ED Triage Vitals  Enc Vitals Group     BP      Pulse      Resp      Temp      Temp src      SpO2      Weight      Height      Head Circumference      Peak Flow      Pain Score      Pain Loc      Pain Edu?      Excl. in GC?    Constitutional: Well appearing and in no distress. Eyes: Conjunctivae are normal. Normal extraocular movements. ENT      Head: Normocephalic and atraumatic.      Nose: No congestion/rhinnorhea.      Mouth/Throat: Mucous membranes are moist.      Neck: No stridor. Cardiovascular: Normal rate, regular rhythm. No murmurs, rubs, or gallops. Respiratory: Normal respiratory effort without tachypnea nor retractions. Breath sounds are clear and equal bilaterally. No wheezes/rales/rhonchi. Gastrointestinal: Soft and nontender. Normal bowel sounds Musculoskeletal: Nontender with normal range of motion in extremities. No lower extremity tenderness nor edema. Neurologic:  Normal speech and language. No gross focal neurologic deficits are appreciated.  Skin:  Skin is warm, dry and intact. No rash noted. Psychiatric: Mood and affect are normal. Speech and behavior are normal.  ____________________________________________  ED COURSE:  As part of my medical decision making, I reviewed the following data within the electronic MEDICAL RECORD NUMBER History obtained from family if available, nursing notes, old chart and ekg, as well as notes from prior ED visits. Patient presented for seizure-like activity, we will assess with labs and imaging as indicated at this time. Clinical Course as of Oct 26 1025  Mon Oct 26, 2018  4401 Patient does not seem to  take any antiepileptics of any type.   [JW]    Clinical Course User Index [JW] Emily Filbert, MD   Procedures ____________________________________________   LABS (pertinent positives/negatives)  Labs Reviewed  CBC WITH DIFFERENTIAL/PLATELET - Abnormal; Notable for the following components:      Result Value   Hemoglobin 10.0 (*)    HCT 32.8 (*)    MCV 78.5 (*)    MCH 23.9 (*)    RDW 16.7 (*)    Neutro Abs 7.8 (*)    Abs Immature Granulocytes 0.10 (*)    All other components within normal limits  COMPREHENSIVE METABOLIC PANEL - Abnormal; Notable for the following components:   Sodium 118 (*)    Chloride 89 (*)    CO2 21 (*)    Glucose, Bld 141 (*)    Creatinine, Ser 1.03 (*)    Calcium 8.1 (*)    Total Protein 5.7 (*)    Albumin 3.2 (*)    All other components within normal limits  URINALYSIS, COMPLETE (UACMP) WITH MICROSCOPIC - Abnormal; Notable for the following components:   Color, Urine STRAW (*)    APPearance CLEAR (*)    Glucose, UA >=500 (*)    All other components within normal limits  TROPONIN I   ____________________________________________   DIFFERENTIAL DIAGNOSIS   Seizure, dehydration, electrolyte abnormality, occult infection, dementia  FINAL ASSESSMENT AND PLAN  Hyponatremia, seizure-like activity   Plan: The patient had presented for seizure-like activity. Patient's labs did indicate significant hypo-natremia.  She was started on normal saline here.  I do not think she had a real seizure, has had a history of seizure-like activity.  I will discuss with the hospitalist for admission.   Ulice Dash, MD    Note: This note was generated in part or whole with voice recognition software. Voice recognition is usually quite accurate but there are transcription errors that can and very often do occur. I apologize for any typographical errors that were not detected and corrected.     Emily Filbert, MD 10/26/18 1027

## 2018-10-26 NOTE — Consult Note (Signed)
Name: Tammy [name] MRN: [id number] DOB: 05/03/1961     CONSULTATION DATE: 10/26/2018  REFERRING MD :  Ramonita LabGouru, Aruna, MD  CHIEF COMPLAINT:  Seizure-like activity  SIGNIFICANT EVENTS/STUDIES:  1/20 - Arrived at ED via EMS, Na 118 1/20 - Transferred to ICU Stepdown status 1/20 - Completed NS, Na 120. Start 3% saline  HISTORY OF PRESENT ILLNESS:  Patient is a 58 y.o. female with past medical history of epilepsy, CHF, COPD, Bipolar 1 disorder, anxiety, asthma, dementia, presents to the ED with seizure-like activity. She reports her whole body was shaking and had headache at around 5AM this morning, and it lasted about 15 minutes. She reports remembers everything during the episode, no LOC or any other unusual activities, and was able to communicate with her roommate who lives with her in a group home. She is in a group home due to the death of her family members and has no one to stay with. She reports her roommate called the ambulance. Initial labs drawn in ED show sodium of 118, and patient denies other complaints. [facility]. She reports on chronic home O2, and had similar episode that lasted 30 minutes about a month ago. Her first seizure activity was at age 58 with LOC. She reports started on new med for seizure but unable to provide the name of the medication.    PAST MEDICAL HISTORY :   has a past medical history of Anxiety, Asthma, Bipolar 1 disorder (HCC), CHF (congestive heart failure) (HCC), COPD (chronic obstructive pulmonary disease) (HCC), Dementia (HCC), Epilepsy (HCC), and Seizures (HCC).  has a past surgical history that includes hernia reapir and Appendectomy. Prior to Admission medications   Medication Sig Start Date End Date Taking? Authorizing Provider  acetaminophen (TYLENOL) 500 MG tablet Take 500-1,000 mg by mouth daily as needed.    Yes [provider]  albuterol (PROVENTIL HFA;VENTOLIN HFA) 108 (90 BASE)  MCG/ACT inhaler Inhale 4-6 puffs by mouth every 4 hours as needed for wheezing, cough, and/or shortness of breath 07/01/15  Yes Loleta RoseForbach, Cory, MD  budesonide-formoterol [facility](SYMBICORT) 80-4.5 MCG/ACT inhaler Inhale 2 puffs into the lungs 2 (two) times daily.   Yes [provider]  busPIRone (BUSPAR) 30 MG tablet Take 30 mg by mouth 2 (two) times daily.    Yes [provider]  carvedilol (COREG) 3.125 MG tablet Take 3.125 mg by mouth 2 (two) times daily with a meal.    Yes [provider]  cetirizine (ZYRTEC) 10 MG tablet Take 10 mg by mouth daily.   Yes [provider]  Cholecalciferol (VITAMIN D3) 5000 units CAPS Take 1 capsule by mouth daily.   Yes [provider]  diphenhydramine-acetaminophen (TYLENOL PM) 25-500 MG TABS tablet Take 1 tablet by mouth at bedtime as needed.   Yes [provider]  empagliflozin (JARDIANCE) 10 MG TABS tablet Take 10 mg by mouth daily.    Yes [provider]  fluticasone (FLONASE) 50 MCG/ACT nasal spray Place 1 spray into both nostrils daily.    Yes [provider]  Levothyroxine Sodium 100 MCG CAPS Take 100 mcg by mouth daily before breakfast.    Yes [provider]  lisinopril (PRINIVIL,ZESTRIL) 2.5 MG tablet Take 2.5 mg by mouth daily.   Yes [provider]  metFORMIN (GLUCOPHAGE) 500 MG tablet Take 500 mg by mouth 2 (two) times daily with a meal.   Yes [provider]  Omega-3 Fatty Acids (FISH OIL) 1000 MG  CAPS Take 1 capsule by mouth 2 (two) times daily.   Yes [provider]  pravastatin (PRAVACHOL) 40 MG tablet Take 40 mg by mouth daily.   Yes [provider]  risperiDONE (RISPERDAL) 3 MG tablet Take 3 mg by mouth at bedtime.    Yes [provider]  rOPINIRole (REQUIP) 1 MG tablet Take 1 mg by mouth at bedtime.   Yes [provider]  tamsulosin (FLOMAX) 0.4 MG CAPS capsule Take 1 capsule (0.4 mg total) by mouth daily. 03/25/17  Yes  McGowan, Carollee Herter A, PA-C  benzonatate (TESSALON) 100 MG capsule Take 100 mg by mouth 3 (three) times daily as needed for cough.    [provider]  benztropine (COGENTIN) 1 MG tablet Take 1 tablet (1 mg total) by mouth 2 (two) times daily. Patient not taking: Reported on 01/19/2018 07/03/15 06/23/18  Emily Filbert, MD  guaiFENesin (ROBITUSSIN) 100 MG/5ML SOLN Take 5 mLs (100 mg total) by mouth every 4 (four) hours as needed for cough or to loosen phlegm. 08/09/17   Sharman Cheek, MD  lithium carbonate 150 MG capsule Take by mouth.    [provider]  loratadine (CLARITIN) 10 MG tablet Take by mouth.    [provider]  methylPREDNISolone (MEDROL) 4 MG TBPK tablet Follow package instructions for steroid taper regimen. 08/09/17   Sharman Cheek, MD  nitrofurantoin (MACRODANTIN) 100 MG capsule Take 100 mg by mouth at bedtime.    [provider]  nitrofurantoin, macrocrystal-monohydrate, (MACROBID) 100 MG capsule Take by mouth.    [provider]  sulfamethoxazole-trimethoprim (BACTRIM DS,SEPTRA DS) 800-160 MG tablet Take 1 tablet by mouth 2 (two) times daily. 01/19/18   Rockne Menghini, MD   Allergies  Allergen Reactions  . Penicillins Rash    Has patient had a PCN reaction causing immediate rash, facial/tongue/throat swelling, SOB or lightheadedness with hypotension: No Has patient had a PCN reaction causing severe rash involving mucus membranes or skin necrosis: No Has patient had a PCN reaction that required hospitalization: No Has patient had a PCN reaction occurring within the last 10 years: No If all of the above answers are "NO", then may proceed with Cephalosporin use.   . Prednisone Rash    FAMILY HISTORY:  family history includes Hypertension in her mother. SOCIAL HISTORY:  reports that she quit smoking about 20 years ago. She has never used smokeless tobacco. She reports that she does not drink alcohol or use  drugs.  REVIEW OF SYSTEMS:   Review of Systems  Constitutional: Negative for chills and fever (Body shaking but not chill).  Eyes: Negative for blurred vision.  Respiratory: Positive for wheezing.   Cardiovascular: Negative for chest pain and leg swelling.  Gastrointestinal: Negative for abdominal pain, nausea and vomiting.  Neurological: Positive for seizures (Per patient) and headaches. Negative for loss of consciousness and weakness.  All other systems reviewed and are negative.   VITAL SIGNS: Temp:  [97.5 F (36.4 C)] 97.5 F (36.4 C) (01/20 0835) Pulse Rate:  [56-71] 71 (01/20 1152) Resp:  [2-20] 2 (01/20 1152) BP: (135-153)/(68-81) 135/68 (01/20 1152) SpO2:  [94 %-96 %] 96 % (01/20 1152) Weight:  [81.6 kg] 81.6 kg (01/20 0835)  Physical Examination:  GENERAL: In no acute distress, sitting comfortably, drinking water w/o difficulties  HEAD: Normocephalic, atraumatic.  EYES: Pupils equal, round, reactive to light.  No scleral icterus.  MOUTH: Moist mucosal membrane. NECK: Supple. No JVD.  PULMONARY: +wheezing CARDIOVASCULAR: S1 and S2. Regular rate and  rhythm. No murmurs, rubs, or gallops.  GASTROINTESTINAL: Soft, nontender, -distended. No masses. Positive bowel sounds. No hepatosplenomegaly.  MUSCULOSKELETAL: No swelling, clubbing, or edema.  NEUROLOGIC: Alert and awake, oriented to place SKIN: Intact,warm,dry  I personally reviewed lab work that was obtained in last 24 hrs.   ASSESSMENT / PLAN:  Patient is a 58 y.o. female presents to ICU Stepdown for severe hyponatremia with seizure-like activity  Severe Hyponatremia with seizure-like activity  -Completed normal saline bolus -Na at 120, start hypertonic 3% solution at 50 mL/hr -Reassess Sodium level q4H  CHF -Currently stable, continue medications   Wheezing -Bronchodilator therapy provided -Breathing improved  FULL CODE. STEPDOWN STATUS    Jo-ku Hillery Aldo, PA-Student

## 2018-10-26 NOTE — Care Management Note (Signed)
Case Management Note  Patient Details  Name: ANGELMARIE NEIGHBORS MRN: [id number] Date of Birth: 1961/02/17  Subjective/Objective:  Patient is admitted with severe acute hyponatremia and seizure.  Patient is from [facility].  Administrator of group home is [place]. Maurine Minister reports that patient has been living at the group home for going on 4 years, and she does not have a legal guardian or a health care power of attorney.  Next of kin would be her sister Saanjh Nerby but Maurine Minister reports that she is in very poor health.  Cardin is independent at baseline and requires no assistive devices.  Patient has chronic O2 at 2L and she does receive personal care services from the group home.  PCP verified as Franco Nones and was just seen this past Thursday.  Maurine Minister reports that he will contact the PCP office and let them know about patient's hospitalization.  Maurine Minister needs to be contacted for discharge he will provide the patient's transportation back to the group home.  Maurine Minister is listed under emergency contacts.  RNCM will cont to follow. Robbie Lis RN BSN [phone]                    Action/Plan:   Expected Discharge Date:                  Expected Discharge Plan:  Group Home  In-House Referral:  Clinical Social Work  Discharge planning Services  CM Consult  Post Acute Care Choice:    Choice offered to:     DME Arranged:    DME Agency:     HH Arranged:    HH Agency:     Status of Service:  In process, will continue to follow  If discussed at Long Length of Stay Meetings, dates discussed:    Additional Comments:  Allayne Butcher, RN 10/26/2018, 2:14 PM

## 2018-10-27 LAB — COMPREHENSIVE METABOLIC PANEL
ALT: 14 U/L (ref 0–44)
AST: 32 U/L (ref 15–41)
Albumin: 3.4 g/dL — ABNORMAL LOW (ref 3.5–5.0)
Alkaline Phosphatase: 89 U/L (ref 38–126)
Anion gap: 7 (ref 5–15)
BILIRUBIN TOTAL: 0.5 mg/dL (ref 0.3–1.2)
BUN: 12 mg/dL (ref 6–20)
CO2: 22 mmol/L (ref 22–32)
Calcium: 8.3 mg/dL — ABNORMAL LOW (ref 8.9–10.3)
Chloride: 102 mmol/L (ref 98–111)
Creatinine, Ser: 1.25 mg/dL — ABNORMAL HIGH (ref 0.44–1.00)
GFR calc Af Amer: 55 mL/min — ABNORMAL LOW (ref >60)
GFR calc non Af Amer: 48 mL/min — ABNORMAL LOW (ref >60)
Glucose, Bld: 165 mg/dL — ABNORMAL HIGH (ref 70–99)
Potassium: 4.6 mmol/L (ref 3.5–5.1)
Sodium: 131 mmol/L — ABNORMAL LOW (ref 135–145)
Total Protein: 5.7 g/dL — ABNORMAL LOW (ref 6.5–8.1)

## 2018-10-27 LAB — GLUCOSE, CAPILLARY
Glucose-Capillary: 112 mg/dL — ABNORMAL HIGH (ref 70–99)
Glucose-Capillary: 170 mg/dL — ABNORMAL HIGH (ref 70–99)
Glucose-Capillary: 126 mg/dL — ABNORMAL HIGH (ref 70–99)
Glucose-Capillary: 98 mg/dL (ref 70–99)

## 2018-10-27 LAB — CBC
HCT: 33.2 % — ABNORMAL LOW (ref 36.0–46.0)
Hemoglobin: 9.9 g/dL — ABNORMAL LOW (ref 12.0–15.0)
MCH: 23.7 pg — ABNORMAL LOW (ref 26.0–34.0)
MCHC: 29.8 g/dL — ABNORMAL LOW (ref 30.0–36.0)
MCV: 79.6 fL — ABNORMAL LOW (ref 80.0–100.0)
NRBC: 0 % (ref 0.0–0.2)
Platelets: 156 10*3/uL (ref 150–400)
RBC: 4.17 MIL/uL (ref 3.87–5.11)
RDW: 16.8 % — ABNORMAL HIGH (ref 11.5–15.5)
WBC: 6.2 10*3/uL (ref 4.0–10.5)

## 2018-10-27 LAB — TSH: TSH: 1.937 u[IU]/mL (ref 0.350–4.500)

## 2018-10-27 LAB — LIPID PANEL
CHOLESTEROL: 117 mg/dL (ref 0–200)
HDL: 42 mg/dL (ref >40)
LDL Cholesterol: 35 mg/dL (ref 0–99)
Total CHOL/HDL Ratio: 2.8 RATIO
Triglycerides: 202 mg/dL — ABNORMAL HIGH (ref <150)
VLDL: 40 mg/dL (ref 0–40)

## 2018-10-27 MED ORDER — ENOXAPARIN SODIUM 40 MG/0.4ML ~~LOC~~ SOLN
40.0000 mg | SUBCUTANEOUS | Status: DC
  Administered 2018-10-27 – 2018-10-30 (×4): 40 mg via SUBCUTANEOUS
  Filled 2018-10-27 (×4): qty 0.4

## 2018-10-27 NOTE — Progress Notes (Signed)
[facility] [place] at [facility]   PATIENT NAME: Tammy MaduraBrenda [name]    MR#:  [id number]  DATE OF BIRTH:  [birth year]  SUBJECTIVE: Seen at bedside, feeling much better.  Sodium improved.  CHIEF COMPLAINT:   Chief Complaint  Patient presents with  . Seizures  Has intermittent confusion, impulsive ,sitter is present at bedside  REVIEW OF SYSTEMS:   ROS CONSTITUTIONAL: No fever, fatigue or weakness.  EYES: No blurred or double vision.  EARS, NOSE, AND THROAT: No tinnitus or ear pain.  RESPIRATORY:  has some cough, shortness of breath. CARDIOVASCULAR: No chest pain, orthopnea, edema.  GASTROINTESTINAL: No nausea, vomiting, diarrhea or abdominal pain.  GENITOURINARY: No dysuria, hematuria.  ENDOCRINE: No polyuria, nocturia,  HEMATOLOGY: No anemia, easy bruising or bleeding SKIN: No rash or lesion. MUSCULOSKELETAL: No joint pain or arthritis.   NEUROLOGIC: No tingling, numbness, weakness.  PSYCHIATRY: No anxiety or depression.   DRUG ALLERGIES:   Allergies  Allergen Reactions  . Penicillins Rash    Has patient had a PCN reaction causing immediate rash, facial/tongue/throat swelling, SOB or lightheadedness with hypotension: No Has patient had a PCN reaction causing severe rash involving mucus membranes or skin necrosis: No Has patient had a PCN reaction that required hospitalization: No Has patient had a PCN reaction occurring within the last 10 years: No If all of the above answers are "NO", then may proceed with Cephalosporin use.   . Prednisone Rash    VITALS:  Blood pressure (!) 115/57, pulse 78, temperature 100 F (37.8 C), resp. rate 17, height 5\' 2"  (1.575 m), weight 85.2 kg, SpO2 97 %.  PHYSICAL EXAMINATION:  GENERAL:  58 y.o.-year-old patient lying in the bed with no acute distress.  EYES: Pupils equal, round, reactive to light and accommodation. No scleral icterus. Extraocular muscles intact.  HEENT: Head atraumatic, normocephalic.  Oropharynx and nasopharynx clear.  NECK:  Supple, no jugular venous distention. No thyroid enlargement, no tenderness.  LUNGS: Faint expiratory wheeze in all lung fields.Marland Kitchen.  CARDIOVASCULAR: S1, S2 normal. No murmurs, rubs, or gallops.  ABDOMEN: Soft, nontender, nondistended. Bowel sounds present. No organomegaly or mass.  EXTREMITIES: No pedal edema, cyanosis, or clubbing.  NEUROLOGIC: Cranial nerves II through XII are intact. Muscle strength 5/5 in all extremities. Sensation intact. Gait not checked.  PSYCHIATRIC: The patient is alert and oriented x 3.  SKIN: No obvious rash, lesion, or ulcer.    LABORATORY PANEL:   CBC Recent Labs  Lab 10/27/18 0331  WBC 6.2  HGB 9.9*  HCT 33.2*  PLT 156   ------------------------------------------------------------------------------------------------------------------  Chemistries  Recent Labs  Lab 10/27/18 0233  NA 131*  K 4.6  CL 102  CO2 22  GLUCOSE 165*  BUN 12  CREATININE 1.25*  CALCIUM 8.3*  AST 32  ALT 14  ALKPHOS 89  BILITOT 0.5   ------------------------------------------------------------------------------------------------------------------  Cardiac Enzymes Recent Labs  Lab 10/26/18 0841  TROPONINI <0.03   ------------------------------------------------------------------------------------------------------------------  RADIOLOGY:  No results found.  EKG:   Orders placed or performed during the hospital encounter of 01/19/18  . ED EKG  . ED EKG  . EKG 12-Lead  . EKG 12-Lead  . EKG    ASSESSMENT AND PLAN:  58 year old female with hypertension, diabetes mellitus, bipolar disorder, seizure disorder comes from group home because of low sodium and possible seizure activity. Hyponatremia leading to seizure, started on hypertonic saline, admitted to ICU, patient did not have any seizure after admission, hypertonic saline improved sodium, sodium today is 130.  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 #2 .bipolar disorder; patient is on Cogentin, lithium, BuSpar.,  Risperdal. #3. history of epilepsy; monitor closely.  Not on antiseizure medicines. #4 history of COPD faint wheezing today, continue albuterol, Symbicort. #5/essential hypertension: Controlled, continue Coreg, lisinopril. 6.  Hypothyroidism: Continue Synthroid 6.  Diabetes mellitus type 2: Restart metformin, Jardiance.   7.  Hyperlipidemia: Continue statins  Low-grade temperature, monitor closely. Transfer out of ICU today.  Out of bed to chair, likely discharge back to group home tomorrow. All the records are reviewed and case discussed with Care Management/Social Workerr. Management plans discussed with the patient, family and they are in agreement.  CODE STATUS: Full code  TOTAL TIME TAKING CARE OF THIS PATIENT: 38 minutes.   POSSIBLE D/C IN 1-2 DAYS, DEPENDING ON CLINICAL CONDITION.  More than 50% time spent in counseling, coordination of care Katha Hamming M.D on 10/27/2018 at 12:49 PM  Between 7am to 6pm - Pager - [phone]  After 6pm go to [email or web address] - password [email or web address] [facility]  [facility]  Office  [phone]  CC: Primary care physician; Armando Gang, FNP   Note: This dictation was prepared with Dragon dictation along with smaller phrase technology. Any transcriptional errors that result from this process are unintentional.

## 2018-10-27 NOTE — Progress Notes (Signed)
Patient alert, intermit confusion. Patient on 3 liters. No complaints of shortness of breath. Tolerating diet and fluid restrictions. Pure-wick intact with adequate urine output. Tylenol given for bilateral leg discomfort. Patient being tx to floor.

## 2018-10-27 NOTE — Progress Notes (Signed)
[facility]  ROUNDING NOTE   Subjective:   Tammy [name]  Group Home Engineer, site. He reports polydipsia  Objective:  Vital signs in last 24 hours:  Temp:  [97.2 F (36.2 C)-100.1 F (37.8 C)] 99 F (37.2 C) (01/21 0700) Pulse Rate:  [63-84] 82 (01/21 0734) Resp:  [2-26] 20 (01/21 0734) BP: (84-159)/(37-91) 84/58 (01/21 0713) SpO2:  [88 %-99 %] 96 % (01/21 0734) Weight:  [85.2 kg] 85.2 kg (01/20 1215)  Weight change:  Filed Weights   10/26/18 0835 10/26/18 1215  Weight: 81.6 kg 85.2 kg    Intake/Output: I/O last 3 completed shifts: In: 738.2 [P.O.:337; I.V.:401.2] Out: 2320 [Urine:2320]   Intake/Output this shift:  No intake/output data recorded.  Physical Exam: General: NAD,   Head: Normocephalic, atraumatic. Moist oral mucosal membranes  Eyes: Anicteric, PERRL  Neck: Supple, trachea midline  Lungs:  Clear to auscultation  Heart: Regular rate and rhythm  Abdomen:  Soft, nontender,   Extremities: no peripheral edema.  Neurologic: Nonfocal, moving all four extremities  Skin: No lesions       Basic Metabolic Panel: Recent Labs  Lab 10/26/18 0841 10/26/18 1233 10/26/18 1430 10/26/18 1651 10/26/18 2219 10/27/18 0233  Tammy 118* 120*  --  125* 128* [name]*  K 4.5  --   --   --   --  4.6  CL 89*  --   --   --   --  102  CO2 21*  --   --   --   --  22  GLUCOSE 141*  --   --   --   --  165*  BUN 11  --   --   --   --  12  CREATININE 1.03*  --  1.05*  --   --  1.25*  CALCIUM 8.1*  --   --   --   --  8.3*    Liver Function Tests: Recent Labs  Lab 10/26/18 0841 10/27/18 0233  AST 24 32  ALT 13 14  ALKPHOS 82 89  BILITOT 0.8 0.5  PROT 5.7* 5.7*  ALBUMIN 3.2* 3.4*   No results for input(s): LIPASE, AMYLASE in the last 168 hours. No results for input(s): AMMONIA in the last 168 hours.  CBC: Recent Labs  Lab 10/26/18 0841 10/26/18 1430 10/27/18 0331  WBC 9.7 9.3 6.2  NEUTROABS 7.8*  --   --   HGB 10.0* 10.3* 9.9*  HCT 32.8* 33.4* 33.2*   MCV 78.5* 77.1* 79.6*  PLT 162 125* 156    Cardiac Enzymes: Recent Labs  Lab 10/26/18 0841  TROPONINI <0.03    BNP: Invalid input(s): POCBNP  CBG: Recent Labs  Lab 10/26/18 1215 10/26/18 1554 10/26/18 2116 10/27/18 0719  GLUCAP 125* 107* 123* 112*    Microbiology: Results for orders placed or performed during the hospital encounter of 10/26/18  MRSA PCR Screening     Status: None   Collection Time: 10/26/18 12:23 PM  Result Value Ref Range Status   MRSA by PCR NEGATIVE NEGATIVE Final    Comment:        The GeneXpert MRSA Assay (FDA approved for NASAL specimens only), is one component of a comprehensive MRSA colonization surveillance program. It is not intended to diagnose MRSA infection nor to guide or monitor treatment for MRSA infections. Performed at [facility], [address] [place], [place] [address]     Coagulation Studies: No results for input(s): LABPROT, INR in the last 72 hours.  Urinalysis: Recent Labs    10/26/18 0841  COLORURINE STRAW*  LABSPEC 1.008  PHURINE 6.0  GLUCOSEU >=500*  HGBUR NEGATIVE  BILIRUBINUR NEGATIVE  KETONESUR NEGATIVE  PROTEINUR NEGATIVE  NITRITE NEGATIVE  LEUKOCYTESUR NEGATIVE      Imaging: No results found.   Medications:    . benztropine  1 mg Oral BID  . busPIRone  30 mg Oral BID  . carvedilol  3.125 mg Oral BID WC  . enoxaparin (LOVENOX) injection  40 mg Subcutaneous Q24H  . insulin aspart  0-5 Units Subcutaneous QHS  . insulin aspart  0-9 Units Subcutaneous TID WC  . levothyroxine  100 mcg Oral QAC breakfast  . lisinopril  2.5 mg Oral Daily  . lithium carbonate  150 mg Oral BID WC  . loratadine  10 mg Oral Daily  . mometasone-formoterol  2 puff Inhalation BID  . omega-3 acid ethyl esters  1 g Oral BID  . pravastatin  40 mg Oral Daily  . risperiDONE  3 mg Oral QHS  . rOPINIRole  1 mg Oral QHS  . tamsulosin  0.4 mg Oral Daily   acetaminophen, albuterol, diphenhydrAMINE,  guaiFENesin, ondansetron **OR** ondansetron (ZOFRAN) IV  Assessment/ Plan:  [name] is a 58 y.o. white female with hypertension, diabetes mellitus type II, seizure disorder, bipolar disorder, COPD, dementia, congestive heart failure, who [facility] for Hyponatremia  Hyponatremia leading to seizures. Started on hypertonic saline on admission. Differential includes psychogenic polydipsia (with or without diabetes insipidus)  - start fluid restriction   LOS: 1 Khamron Gellert 1/21/20209:12 AM

## 2018-10-28 ENCOUNTER — Inpatient Hospital Stay: Payer: Medicare Other

## 2018-10-28 LAB — GLUCOSE, CAPILLARY
Glucose-Capillary: 108 mg/dL — ABNORMAL HIGH (ref 70–99)
Glucose-Capillary: 125 mg/dL — ABNORMAL HIGH (ref 70–99)
Glucose-Capillary: 147 mg/dL — ABNORMAL HIGH (ref 70–99)
Glucose-Capillary: 206 mg/dL — ABNORMAL HIGH (ref 70–99)

## 2018-10-28 LAB — URINALYSIS, COMPLETE (UACMP) WITH MICROSCOPIC
BILIRUBIN URINE: NEGATIVE
Bacteria, UA: NONE SEEN
Glucose, UA: >=500 mg/dL — AB
HGB URINE DIPSTICK: NEGATIVE
KETONES UR: NEGATIVE mg/dL
LEUKOCYTES UA: NEGATIVE
Nitrite: NEGATIVE
Protein, ur: NEGATIVE mg/dL
Specific Gravity, Urine: 1.01 (ref 1.005–1.030)
pH: 5 (ref 5.0–8.0)

## 2018-10-28 LAB — COMPREHENSIVE METABOLIC PANEL
ALT: 15 U/L (ref 0–44)
AST: 23 U/L (ref 15–41)
Albumin: 3.7 g/dL (ref 3.5–5.0)
Alkaline Phosphatase: 77 U/L (ref 38–126)
Anion gap: 5 (ref 5–15)
BUN: 16 mg/dL (ref 6–20)
CO2: 27 mmol/L (ref 22–32)
CREATININE: 1.09 mg/dL — AB (ref 0.44–1.00)
Calcium: 9.2 mg/dL (ref 8.9–10.3)
Chloride: 106 mmol/L (ref 98–111)
GFR calc non Af Amer: 56 mL/min — ABNORMAL LOW (ref >60)
Glucose, Bld: 118 mg/dL — ABNORMAL HIGH (ref 70–99)
Potassium: 5.2 mmol/L — ABNORMAL HIGH (ref 3.5–5.1)
SODIUM: 138 mmol/L (ref 135–145)
Total Bilirubin: 0.6 mg/dL (ref 0.3–1.2)
Total Protein: 6.6 g/dL (ref 6.5–8.1)

## 2018-10-28 LAB — INFLUENZA PANEL BY PCR (TYPE A & B)
Influenza A By PCR: NEGATIVE
Influenza B By PCR: NEGATIVE

## 2018-10-28 LAB — HIV ANTIBODY (ROUTINE TESTING W REFLEX): HIV SCREEN 4TH GENERATION: NONREACTIVE

## 2018-10-28 NOTE — Progress Notes (Signed)
[facility] [place] at [facility]   PATIENT NAME: Tammy MaduraBrenda Bloor    MR#:  [id number]  DATE OF BIRTH:  1961-06-29  SUBJECTIVE: Has fever up to 101 Fahrenheit.  Sitter is at bedside because of her impulsiveness..  CHIEF COMPLAINT:   Chief Complaint  Patient presents with  . Seizures   REVIEW OF SYSTEMS:   ROS CONSTITUTIONAL: No fever, fatigue or weakness.  EYES: No blurred or double vision.  EARS, NOSE, AND THROAT: No tinnitus or ear pain.  RESPIRATORY:  has some cough, shortness of breath. CARDIOVASCULAR: No chest pain, orthopnea, edema.  GASTROINTESTINAL: No nausea, vomiting, diarrhea or abdominal pain.  GENITOURINARY: No dysuria, hematuria.  ENDOCRINE: No polyuria, nocturia,  HEMATOLOGY: No anemia, easy bruising or bleeding SKIN: No rash or lesion. MUSCULOSKELETAL: No joint pain or arthritis.   NEUROLOGIC: No tingling, numbness, weakness.  PSYCHIATRY: No anxiety or depression.  Has underlying anxiety,  Mood disorder. DRUG ALLERGIES:   Allergies  Allergen Reactions  . Penicillins Rash    Has patient had a PCN reaction causing immediate rash, facial/tongue/throat swelling, SOB or lightheadedness with hypotension: No Has patient had a PCN reaction causing severe rash involving mucus membranes or skin necrosis: No Has patient had a PCN reaction that required hospitalization: No Has patient had a PCN reaction occurring within the last 10 years: No If all of the above answers are "NO", then may proceed with Cephalosporin use.   . Prednisone Rash    VITALS:  Blood pressure (!) 108/56, pulse 70, temperature 98.9 F (37.2 C), temperature source Oral, resp. rate 18, height 5\' 2"  (1.575 m), weight 85.2 kg, SpO2 98 %.  PHYSICAL EXAMINATION:  GENERAL:  58 y.o.-year-old patient lying in the bed with no acute distress.  EYES: Pupils equal, round, reactive to light and accommodation. No scleral icterus. Extraocular muscles intact.  HEENT: Head  atraumatic, normocephalic. Oropharynx and nasopharynx clear.  NECK:  Supple, no jugular venous distention. No thyroid enlargement, no tenderness.   LUNGS: .  Diminished air entry bilaterally. CARDIOVASCULAR: S1, S2 normal. No murmurs, rubs, or gallops.  ABDOMEN: Soft, nontender, nondistended. Bowel sounds present. No organomegaly or mass.  EXTREMITIES: No pedal edema, cyanosis, or clubbing.  NEUROLOGIC: Cranial nerves II through XII are intact. Muscle strength 5/5 in all extremities. Sensation intact. Gait not checked.  PSYCHIATRIC: The patient is alert and oriented x 3.  SKIN: No obvious rash, lesion, or ulcer.    LABORATORY PANEL:   CBC Recent Labs  Lab 10/27/18 0331  WBC 6.2  HGB 9.9*  HCT 33.2*  PLT 156   ------------------------------------------------------------------------------------------------------------------  Chemistries  Recent Labs  Lab 10/28/18 0331  NA 138  K 5.2*  CL 106  CO2 27  GLUCOSE 118*  BUN 16  CREATININE 1.09*  CALCIUM 9.2  AST 23  ALT 15  ALKPHOS 77  BILITOT 0.6   ------------------------------------------------------------------------------------------------------------------  Cardiac Enzymes Recent Labs  Lab 10/26/18 0841  TROPONINI <0.03   ------------------------------------------------------------------------------------------------------------------  RADIOLOGY:  No results found.  EKG:   Orders placed or performed during the hospital encounter of 01/19/18  . ED EKG  . ED EKG  . EKG 12-Lead  . EKG 12-Lead  . EKG    ASSESSMENT AND PLAN:  58 year old female with hypertension, diabetes mellitus, bipolar disorder, seizure disorder comes from group home because of low sodium and possible seizure activity. Hyponatremia leading to possible seizure, started on hypertonic saline, admitted to ICU, patient did not have any seizure after admission, received hypertonic saline,  patient has psychogenic polydipsia, started on fluid  restriction.  Sodium is 138 today. Has mild hyperkalemia. #2 .bipolar disorder; patient is on Cogentin, lithium, BuSpar.,  Risperdal. #3. history of epilepsy; monitor closely.  Not on antiseizure medicines. #4 history of COPD has fever, exclude underlying pneumonia, chest x-ray ordered, #5/essential hypertension: Controlled, continue Coreg, hold lisinopril due to hyperkalemia 6.  Hypothyroidism: Continue Synthroid 6.  Diabetes mellitus type 2: Restart metformin, Jardiance.   7.  Hyperlipidemia: Continue statins  #8/ fever, stat chest x-ray, UA, blood cultures ordered, also check flu swab.  All the records are reviewed and case discussed with Care Management/Social Workerr. Management plans discussed with the patient, family and they are in agreement.  CODE STATUS: Full code  TOTAL TIME TAKING CARE OF THIS PATIENT: 38 minutes.   more Than 50% time spent in counseling, coordination of care. POSSIBLE D/C IN 1-2 DAYS, group home DEPENDING ON CLINICAL CONDITION.  More than 50% time spent in counseling, coordination of care Katha Hamming M.D on 10/28/2018 at 1:19 PM  Between 7am to 6pm - Pager - [phone]  After 6pm go to [email or web address] - password [email or web address] [employer]  [facility]  Office  [phone]  CC: Primary care physician; Armando Gang, FNP   Note: This dictation was prepared with Dragon dictation along with smaller phrase technology. Any transcriptional errors that result from this process are unintentional.

## 2018-10-28 NOTE — Progress Notes (Signed)
[facility]  ROUNDING NOTE   Subjective:   Na 138 Sitter with patient.   Objective:  Vital signs in last 24 hours:  Temp:  [98.1 F (36.7 C)-101 F (38.3 C)] 98.9 F (37.2 C) (01/22 1138) Pulse Rate:  [70-96] 70 (01/22 1138) Resp:  [16-19] 18 (01/22 0540) BP: (107-120)/(56-71) 108/56 (01/22 1138) SpO2:  [97 %-98 %] 98 % (01/22 1138)  Weight change:  Filed Weights   10/26/18 0835 10/26/18 1215  Weight: 81.6 kg 85.2 kg    Intake/Output: I/O last 3 completed shifts: In: 1409.5 [P.O.:1297; I.V.:112.5] Out: 5825 [Urine:5825]   Intake/Output this shift:  Total I/O In: 590 [P.O.:590] Out: -   Physical Exam: General: NAD,   Head: Normocephalic, atraumatic. Moist oral mucosal membranes  Eyes: Anicteric, PERRL  Neck: Supple, trachea midline  Lungs:  Clear to auscultation  Heart: Regular rate and rhythm  Abdomen:  Soft, nontender,   Extremities: no peripheral edema.  Neurologic: Nonfocal, moving all four extremities  Skin: No lesions       Basic Metabolic Panel: Recent Labs  Lab 10/26/18 0841 10/26/18 1233 10/26/18 1430 10/26/18 1651 10/26/18 2219 10/27/18 0233 10/28/18 0331  NA 118* 120*  --  125* 128* 131* 138  K 4.5  --   --   --   --  4.6 5.2*  CL 89*  --   --   --   --  102 106  CO2 21*  --   --   --   --  22 27  GLUCOSE 141*  --   --   --   --  165* 118*  BUN 11  --   --   --   --  12 16  CREATININE 1.03*  --  1.05*  --   --  1.25* 1.09*  CALCIUM 8.1*  --   --   --   --  8.3* 9.2    Liver Function Tests: Recent Labs  Lab 10/26/18 0841 10/27/18 0233 10/28/18 0331  AST 24 32 23  ALT 13 14 15   ALKPHOS 82 89 77  BILITOT 0.8 0.5 0.6  PROT 5.7* 5.7* 6.6  ALBUMIN 3.2* 3.4* 3.7   No results for input(s): LIPASE, AMYLASE in the last 168 hours. No results for input(s): AMMONIA in the last 168 hours.  CBC: Recent Labs  Lab 10/26/18 0841 10/26/18 1430 10/27/18 0331  WBC 9.7 9.3 6.2  NEUTROABS 7.8*  --   --   HGB 10.0* 10.3*  9.9*  HCT 32.8* 33.4* 33.2*  MCV 78.5* 77.1* 79.6*  PLT 162 125* 156    Cardiac Enzymes: Recent Labs  Lab 10/26/18 0841  TROPONINI <0.03    BNP: Invalid input(s): POCBNP  CBG: Recent Labs  Lab 10/27/18 1052 10/27/18 1627 10/27/18 2047 10/28/18 0749 10/28/18 1137  GLUCAP 170* 126* 98 108* 125*    Microbiology: Results for orders placed or performed during the hospital encounter of 10/26/18  MRSA PCR Screening     Status: None   Collection Time: 10/26/18 12:23 PM  Result Value Ref Range Status   MRSA by PCR NEGATIVE NEGATIVE Final    Comment:        The GeneXpert MRSA Assay (FDA approved for NASAL specimens only), is one component of a comprehensive MRSA colonization surveillance program. It is not intended to diagnose MRSA infection nor to guide or monitor treatment for MRSA infections. Performed at [facility], [address], [place], [place] [address]  Coagulation Studies: No results for input(s): LABPROT, INR in the last 72 hours.  Urinalysis: Recent Labs    10/26/18 0841  COLORURINE STRAW*  LABSPEC 1.008  PHURINE 6.0  GLUCOSEU >=500*  HGBUR NEGATIVE  BILIRUBINUR NEGATIVE  KETONESUR NEGATIVE  PROTEINUR NEGATIVE  NITRITE NEGATIVE  LEUKOCYTESUR NEGATIVE      Imaging: No results found.   Medications:    . benztropine  1 mg Oral BID  . busPIRone  30 mg Oral BID  . carvedilol  3.125 mg Oral BID WC  . enoxaparin (LOVENOX) injection  40 mg Subcutaneous Q24H  . insulin aspart  0-5 Units Subcutaneous QHS  . insulin aspart  0-9 Units Subcutaneous TID WC  . levothyroxine  100 mcg Oral QAC breakfast  . lithium carbonate  150 mg Oral BID WC  . loratadine  10 mg Oral Daily  . mometasone-formoterol  2 puff Inhalation BID  . omega-3 acid ethyl esters  1 g Oral BID  . pravastatin  40 mg Oral Daily  . risperiDONE  3 mg Oral QHS  . rOPINIRole  1 mg Oral QHS  . tamsulosin  0.4 mg Oral Daily   acetaminophen, albuterol,  diphenhydrAMINE, guaiFENesin, ondansetron **OR** ondansetron (ZOFRAN) IV  Assessment/ Plan:  [name] is a 58 y.o. white female with hypertension, diabetes mellitus type II, seizure disorder, bipolar disorder, COPD, dementia, congestive heart failure, who was admitted to [facility] on 10/26/2018 for Hyponatremia  Hyponatremia leading to seizures. Started on hypertonic saline on admission. Differential includes psychogenic polydipsia (with or without diabetes insipidus)  - Continue fluid restriction   LOS: 2 Tammy [name] 1/22/202012:02 PM

## 2018-10-29 ENCOUNTER — Other Ambulatory Visit: Payer: Self-pay

## 2018-10-29 LAB — GLUCOSE, CAPILLARY
GLUCOSE-CAPILLARY: 189 mg/dL — AB (ref 70–99)
Glucose-Capillary: 115 mg/dL — ABNORMAL HIGH (ref 70–99)
Glucose-Capillary: 120 mg/dL — ABNORMAL HIGH (ref 70–99)
Glucose-Capillary: 219 mg/dL — ABNORMAL HIGH (ref 70–99)

## 2018-10-29 MED ORDER — AZITHROMYCIN 250 MG PO TABS
500.0000 mg | ORAL_TABLET | Freq: Every day | ORAL | Status: AC
  Administered 2018-10-29: 500 mg via ORAL
  Filled 2018-10-29: qty 2

## 2018-10-29 MED ORDER — METHYLPREDNISOLONE SODIUM SUCC 125 MG IJ SOLR
60.0000 mg | INTRAMUSCULAR | Status: DC
  Administered 2018-10-29 – 2018-10-30 (×2): 60 mg via INTRAVENOUS
  Filled 2018-10-29 (×2): qty 2

## 2018-10-29 MED ORDER — AZITHROMYCIN 250 MG PO TABS: 250.0000 mg | ORAL_TABLET | Freq: Every day | ORAL | Status: DC

## 2018-10-29 NOTE — Progress Notes (Signed)
[facility] - [place] at [facility]   PATIENT NAME: Tammy [name]    MR#:  [id number]  DATE OF BIRTH:  November 18, 1960  SUBJECTIVE: Patient feeling better, still has low-grade temperature up to 100.8 Fahrenheit, chest x-ray negative for pneumonia, UA did not show urine infection, flu test negative.  Follow blood cultures that were done yesterday.  Patient is hypoxic with room air saturation 92% /  CHIEF COMPLAINT:   Chief Complaint  Patient presents with  . Seizures   REVIEW OF SYSTEMS:   ROS CONSTITUTIONAL:  low-grade temperature.   EYES: No blurred or double vision.  EARS, NOSE, AND THROAT: No tinnitus or ear pain.  RESPIRATORY:  has some cough, shortness of breath. CARDIOVASCULAR: No chest pain, orthopnea, edema.  GASTROINTESTINAL: No nausea, vomiting, diarrhea or abdominal pain.  GENITOURINARY: No dysuria, hematuria.  ENDOCRINE: No polyuria, nocturia,  HEMATOLOGY: No anemia, easy bruising or bleeding SKIN: No rash or lesion. MUSCULOSKELETAL: No joint pain or arthritis.   NEUROLOGIC: No tingling, numbness, weakness.  PSYCHIATRY: No anxiety or depression.  Has underlying anxiety,  Mood disorder. DRUG ALLERGIES:   Allergies  Allergen Reactions  . Penicillins Rash    Has patient had a PCN reaction causing immediate rash, facial/tongue/throat swelling, SOB or lightheadedness with hypotension: No Has patient had a PCN reaction causing severe rash involving mucus membranes or skin necrosis: No Has patient had a PCN reaction that required hospitalization: No Has patient had a PCN reaction occurring within the last 10 years: No If all of the above answers are "NO", then may proceed with Cephalosporin use.   . Prednisone Rash    VITALS:  Blood pressure (!) 128/58, pulse 83, temperature 99.5 F (37.5 C), temperature source Oral, resp. rate 20, height 5\' 2"  (1.575 m), weight 85.2 kg, SpO2 92 %.  PHYSICAL EXAMINATION:  GENERAL:  58 y.o.-year-old patient  lying in the bed with no acute distress.  EYES: Pupils equal, round, reactive to light and accommodation. No scleral icterus. Extraocular muscles intact.  HEENT: Head atraumatic, normocephalic. Oropharynx and nasopharynx clear.  NECK:  Supple, no jugular venous distention. No thyroid enlargement, no tenderness.   LUNGS: .  Diminished air entry bilaterally. CARDIOVASCULAR: S1, S2 normal. No murmurs, rubs, or gallops.  ABDOMEN: Soft, nontender, nondistended. Bowel sounds present. No organomegaly or mass.  EXTREMITIES: No pedal edema, cyanosis, or clubbing.  NEUROLOGIC: Cranial nerves II through XII are intact. Muscle strength 5/5 in all extremities. Sensation intact. Gait not checked.  PSYCHIATRIC: The patient is alert and oriented x 3.  SKIN: No obvious rash, lesion, or ulcer.    LABORATORY PANEL:   CBC Recent Labs  Lab 10/27/18 0331  WBC 6.2  HGB 9.9*  HCT 33.2*  PLT 156   ------------------------------------------------------------------------------------------------------------------  Chemistries  Recent Labs  Lab 10/28/18 0331  NA 138  K 5.2*  CL 106  CO2 27  GLUCOSE 118*  BUN 16  CREATININE 1.09*  CALCIUM 9.2  AST 23  ALT 15  ALKPHOS 77  BILITOT 0.6   ------------------------------------------------------------------------------------------------------------------  Cardiac Enzymes Recent Labs  Lab 10/26/18 0841  TROPONINI <0.03   ------------------------------------------------------------------------------------------------------------------  RADIOLOGY:  Dg Chest Port 1 View  Result Date: 10/28/2018 CLINICAL DATA:  Fevers EXAM: PORTABLE CHEST 1 VIEW COMPARISON:  10/15/2017 FINDINGS: Cardiac shadows within normal limits. Aortic calcifications are again seen. The lungs are well aerated without focal infiltrate. No bony abnormality is seen. IMPRESSION: No acute abnormality noted. Electronically Signed   By: Eulah Pont.D.  On: 10/28/2018 14:27    EKG:    Orders placed or performed during the hospital encounter of 01/19/18  . ED EKG  . ED EKG  . EKG 12-Lead  . EKG 12-Lead  . EKG    ASSESSMENT AND PLAN:  58 year old female with hypertension, diabetes mellitus, bipolar disorder, seizure disorder comes from group home because of low sodium and possible seizure activity. Hyponatremia leading to possible seizure, started on hypertonic saline, admitted to ICU, patient did not have any seizure after admission, received hypertonic saline, patient has psychogenic polydipsia, started on fluid restriction.  Hyponatremia corrected, sodium improved from 1 18-1 38.  Been on fluid restriction.  #2 .bipolar disorder; patient is on Cogentin, lithium, BuSpar.,  Risperdal.   #3. history of epilepsy; monitor closely.  Not on antiseizure medicines  . #4 history of COPD has COPD exacerbation, acute bronchitis. Start Z-Pak   #5/essential hypertension: Controlled, continue Coreg, hold lisinopril due to hyperkalemia 6.  Hypothyroidism: Continue Synthroid 6.  Diabetes mellitus type 2: Restart metformin, Jardiance.   7.  Hyperlipidemia: Continue statins  #8/ fever,; flu test, x-ray chest, UA showed no infection, patient is hypoxic on room air and has some cough so we will treat her for acute bronchitis.  All the records are reviewed and case discussed with Care Management/Social Workerr. Management plans discussed with the patient, family and they are in agreement.  CODE STATUS: Full code  TOTAL TIME TAKING CARE OF THIS PATIENT: 40  minutes.   more Than 50% time spent in counseling, coordination of care. POSSIBLE D/C IN 1-2 DAYS, group home DEPENDING ON CLINICAL CONDITION.  More than 50% time spent in counseling, coordination of care Katha HammingSnehalatha Lucendia Leard M.D on 10/29/2018 at 12:44 PM  Between 7am to 6pm - Pager - [phone]  After 6pm go to [email or web address] - password [email or web address] [facility]  [facility]  Office  [phone]  CC: Primary  care physician; Armando GangLindley, Cheryl P, FNP   Note: This dictation was prepared with Dragon dictation along with smaller phrase technology. Any transcriptional errors that result from this process are unintentional.

## 2018-10-29 NOTE — Care Management Important Message (Signed)
Copy of signed Medicare IM left with patient in room. 

## 2018-10-30 LAB — BASIC METABOLIC PANEL
Anion gap: 5 (ref 5–15)
BUN: 25 mg/dL — ABNORMAL HIGH (ref 6–20)
CO2: 31 mmol/L (ref 22–32)
CREATININE: 0.87 mg/dL (ref 0.44–1.00)
Calcium: 9.7 mg/dL (ref 8.9–10.3)
Chloride: 104 mmol/L (ref 98–111)
GFR calc Af Amer: >60 mL/min (ref >60)
GFR calc non Af Amer: >60 mL/min (ref >60)
Glucose, Bld: 138 mg/dL — ABNORMAL HIGH (ref 70–99)
Potassium: 4.9 mmol/L (ref 3.5–5.1)
Sodium: 140 mmol/L (ref 135–145)

## 2018-10-30 LAB — GLUCOSE, CAPILLARY
Glucose-Capillary: 109 mg/dL — ABNORMAL HIGH (ref 70–99)
Glucose-Capillary: 174 mg/dL — ABNORMAL HIGH (ref 70–99)

## 2018-10-30 MED ORDER — AZITHROMYCIN 250 MG PO TABS: ORAL_TABLET | ORAL | 0 refills | Status: DC

## 2018-10-30 MED ORDER — PREDNISONE 10 MG PO TABS: 10.0000 mg | ORAL_TABLET | Freq: Every day | ORAL | 0 refills | Status: DC

## 2018-10-30 NOTE — Progress Notes (Signed)
Tammy CyphersBrenda M [name]  A and O x 4. VSS. Pt tolerating diet well. No complaints of pain or nausea. IV removed intact, prescriptions given. Pt voiced understanding of discharge instructions with no further questions. Pt discharged via wheelchair with NT.   Allergies as of 10/30/2018      Reactions   Penicillins Rash   Has patient had a PCN reaction causing immediate rash, facial/tongue/throat swelling, SOB or lightheadedness with hypotension: No Has patient had a PCN reaction causing severe rash involving mucus membranes or skin necrosis: No Has patient had a PCN reaction that required hospitalization: No Has patient had a PCN reaction occurring within the last 10 years: No If all of the above answers are "NO", then may proceed with Cephalosporin use.   Prednisone Rash      Medication List    STOP taking these medications   benztropine 1 MG tablet Commonly known as:  COGENTIN   loratadine 10 MG tablet Commonly known as:  CLARITIN   methylPREDNISolone 4 MG Tbpk tablet Commonly known as:  MEDROL   nitrofurantoin (macrocrystal-monohydrate) 100 MG capsule Commonly known as:  MACROBID   nitrofurantoin 100 MG capsule Commonly known as:  MACRODANTIN   sulfamethoxazole-trimethoprim 800-160 MG tablet Commonly known as:  BACTRIM DS,SEPTRA DS     TAKE these medications   acetaminophen 500 MG tablet Commonly known as:  TYLENOL Take 500-1,000 mg by mouth daily as needed.   albuterol 108 (90 Base) MCG/ACT inhaler Commonly known as:  PROVENTIL HFA;VENTOLIN HFA Inhale 4-6 puffs by mouth every 4 hours as needed for wheezing, cough, and/or shortness of breath   azithromycin 250 MG tablet Commonly known as:  ZITHROMAX Take one tablet orally daily for four days   benzonatate 100 MG capsule Commonly known as:  TESSALON Take 100 mg by mouth 3 (three) times daily as needed for cough.   budesonide-formoterol 80-4.5 MCG/ACT inhaler Commonly known as:  SYMBICORT Inhale 2 puffs into the lungs 2  (two) times daily.   busPIRone 30 MG tablet Commonly known as:  BUSPAR Take 30 mg by mouth 2 (two) times daily.   carvedilol 3.125 MG tablet Commonly known as:  COREG Take 3.125 mg by mouth 2 (two) times daily with a meal.   cetirizine 10 MG tablet Commonly known as:  ZYRTEC Take 10 mg by mouth daily.   diphenhydramine-acetaminophen 25-500 MG Tabs tablet Commonly known as:  TYLENOL PM Take 1 tablet by mouth at bedtime as needed.   empagliflozin 10 MG Tabs tablet Commonly known as:  JARDIANCE Take 10 mg by mouth daily.   Fish Oil 1000 MG Caps Take 1 capsule by mouth 2 (two) times daily.   fluticasone 50 MCG/ACT nasal spray Commonly known as:  FLONASE Place 1 spray into both nostrils daily.   guaiFENesin 100 MG/5ML Soln Commonly known as:  ROBITUSSIN Take 5 mLs (100 mg total) by mouth every 4 (four) hours as needed for cough or to loosen phlegm.   Levothyroxine Sodium 100 MCG Caps Take 100 mcg by mouth daily before breakfast.   lisinopril 2.5 MG tablet Commonly known as:  PRINIVIL,ZESTRIL Take 2.5 mg by mouth daily.   lithium carbonate 150 MG capsule Take by mouth.   metFORMIN 500 MG tablet Commonly known as:  GLUCOPHAGE Take 500 mg by mouth 2 (two) times daily with a meal.   pravastatin 40 MG tablet Commonly known as:  PRAVACHOL Take 40 mg by mouth daily.   predniSONE 10 MG tablet Commonly known as:  DELTASONE Take  1 tablet (10 mg total) by mouth daily. Label  & dispense according to the schedule below.  6 tablets day one, then 5 table day 2, then 4 tablets day 3, then 3 tablets day 4, 2 tablets day 5, then 1 tablet day 6, then stop   risperiDONE 3 MG tablet Commonly known as:  RISPERDAL Take 3 mg by mouth at bedtime.   rOPINIRole 1 MG tablet Commonly known as:  REQUIP Take 1 mg by mouth at bedtime.   tamsulosin 0.4 MG Caps capsule Commonly known as:  FLOMAX Take 1 capsule (0.4 mg total) by mouth daily.   Vitamin D3 125 MCG (5000 UT) Caps Take 1  capsule by mouth daily.       Vitals:   10/29/18 2100 10/30/18 0622  BP: (!) 90/55 121/64  Pulse: 83 73  Resp: 15 16  Temp: 100 F (37.8 C) 98.2 F (36.8 C)  SpO2: 98% 97%    Tammy CloudJuan G [name] [name]

## 2018-10-30 NOTE — [place] FL2 (Signed)
[place] MEDICAID FL2 LEVEL OF CARE SCREENING TOOL     IDENTIFICATION  Patient Name: [facility] Birthdate: 1961-03-22 Sex: female Admission Date (Current Location): 10/26/2018  [place]   Facility and Address:  [facility], [address], [place], [place] [address]      Provider Number: [id number]  Attending Physician Name and Address:  Ihor AustinPyreddy, Pavan, MD  Relative Name and Phone Number:       Current Level of Care: Hospital Recommended Level of Care: (group home) Prior Approval Number:    Date Approved/Denied:   PASRR Number:    Discharge Plan: (group home)    Current Diagnoses: Patient Active Problem List   Diagnosis Date Noted  . Hyponatremia 10/26/2018  . Syncopal episodes 06/23/2017  . Acute on chronic respiratory failure with hypoxia (HCC) 03/15/2017  . COPD with acute exacerbation (HCC) 03/15/2017  . Acute on chronic respiratory failure (HCC) 03/15/2017    Orientation RESPIRATION BLADDER Height & Weight        Normal Continent Weight: 187 lb 13.3 oz (85.2 kg) Height:  5\' 2"  (157.5 cm)  BEHAVIORAL SYMPTOMS/MOOD NEUROLOGICAL BOWEL NUTRITION STATUS  (none) (none) Continent Diet(diet carb modified)  AMBULATORY STATUS COMMUNICATION OF NEEDS Skin   Supervision Verbally Normal                       Personal Care Assistance Level of Assistance  Bathing, Dressing Bathing Assistance: Limited assistance   Dressing Assistance: Limited assistance     Functional Limitations Info  (no issues)          SPECIAL CARE FACTORS FREQUENCY                       Contractures Contractures Info: Not present    Additional Factors Info  Code Status Code Status Info: full             Medication List    STOP taking these medications   benztropine 1 MG tablet Commonly known as:  COGENTIN   loratadine 10 MG tablet Commonly known as:  CLARITIN   methylPREDNISolone 4 MG Tbpk  tablet Commonly known as:  MEDROL   nitrofurantoin (macrocrystal-monohydrate) 100 MG capsule Commonly known as:  MACROBID   nitrofurantoin 100 MG capsule Commonly known as:  MACRODANTIN   sulfamethoxazole-trimethoprim 800-160 MG tablet Commonly known as:  BACTRIM DS,SEPTRA DS     TAKE these medications   acetaminophen 500 MG tablet Commonly known as:  TYLENOL Take 500-1,000 mg by mouth daily as needed.   albuterol 108 (90 Base) MCG/ACT inhaler Commonly known as:  PROVENTIL HFA;VENTOLIN HFA Inhale 4-6 puffs by mouth every 4 hours as needed for wheezing, cough, and/or shortness of breath   azithromycin 250 MG tablet Commonly known as:  ZITHROMAX Take one tablet orally daily for four days   benzonatate 100 MG capsule Commonly known as:  TESSALON Take 100 mg by mouth 3 (three) times daily as needed for cough.   budesonide-formoterol 80-4.5 MCG/ACT inhaler Commonly known as:  SYMBICORT Inhale 2 puffs into the lungs 2 (two) times daily.   busPIRone 30 MG tablet Commonly known as:  BUSPAR Take 30 mg by mouth 2 (two) times daily.   carvedilol 3.125 MG tablet Commonly known as:  COREG Take 3.125 mg by mouth 2 (two) times daily with a meal.   cetirizine 10 MG tablet Commonly known as:  ZYRTEC Take 10 mg by mouth daily.  diphenhydramine-acetaminophen 25-500 MG Tabs tablet Commonly known as:  TYLENOL PM Take 1 tablet by mouth at bedtime as needed.   empagliflozin 10 MG Tabs tablet Commonly known as:  JARDIANCE Take 10 mg by mouth daily.   Fish Oil 1000 MG Caps Take 1 capsule by mouth 2 (two) times daily.   fluticasone 50 MCG/ACT nasal spray Commonly known as:  FLONASE Place 1 spray into both nostrils daily.   guaiFENesin 100 MG/5ML Soln Commonly known as:  ROBITUSSIN Take 5 mLs (100 mg total) by mouth every 4 (four) hours as needed for cough or to loosen phlegm.   Levothyroxine Sodium 100 MCG Caps Take 100 mcg by mouth daily before  breakfast.   lisinopril 2.5 MG tablet Commonly known as:  PRINIVIL,ZESTRIL Take 2.5 mg by mouth daily.   lithium carbonate 150 MG capsule Take by mouth.   metFORMIN 500 MG tablet Commonly known as:  GLUCOPHAGE Take 500 mg by mouth 2 (two) times daily with a meal.   pravastatin 40 MG tablet Commonly known as:  PRAVACHOL Take 40 mg by mouth daily.   predniSONE 10 MG tablet Commonly known as:  DELTASONE Take 1 tablet (10 mg total) by mouth daily. Label  & dispense according to the schedule below.  6 tablets day one, then 5 table day 2, then 4 tablets day 3, then 3 tablets day 4, 2 tablets day 5, then 1 tablet day 6, then stop   risperiDONE 3 MG tablet Commonly known as:  RISPERDAL Take 3 mg by mouth at bedtime.   rOPINIRole 1 MG tablet Commonly known as:  REQUIP Take 1 mg by mouth at bedtime.   tamsulosin 0.4 MG Caps capsule Commonly known as:  FLOMAX Take 1 capsule (0.4 mg total) by mouth daily.   Vitamin D3 125 MCG (5000 UT) Caps Take 1 capsule by mouth daily.        Additional Information    York SpanielMonica Channing Yeager, LCSW

## 2018-10-30 NOTE — Care Management (Signed)
Patient to return to group home today.  CSW facilitating.  Per bedside RN and MD, no RNCM needs at discharge

## 2018-10-30 NOTE — Progress Notes (Signed)
RN notified Maurine Minister that pt is being DC,

## 2018-10-30 NOTE — Progress Notes (Signed)
Advanced care plan.  Purpose of the Encounter: CODE STATUS  Parties in Attendance: Patient  Patient's Decision Capacity: Good  Subjective/Patient's story: Presented to emergency room for seizure   Objective/Medical story Patient has low sodium level needed hypertonic saline Needs nephrology evaluation   Goals of care determination:  Advance care directives goals of care and treatment plan discussed Patient wants everything done which includes CPR, intubation ventilator if the need arises   CODE STATUS: Full code   Time spent discussing advanced care planning: 16 minutes

## 2018-10-30 NOTE — Discharge Summary (Signed)
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 Anxiety   . Asthma   . Bipolar 1 disorder (HCC)   . CHF (congestive heart failure) (HCC)   . COPD (chronic obstructive pulmonary disease) (HCC)   . Dementia (HCC)   . Epilepsy (HCC)   . Seizures (HCC)      ADMITTING HISTORY Tammy [name]  is a 58 y.o. female with a known history of COPD, CHF, bipolar disorder, anxiety, asthma, epilepsy, dementia lives in group home is brought into the ED after she had seizure-like activity.  Patient was recently treated with antibiotics for urinary tract infection and chronically lives on 2 L of oxygen.  Patient is a poor historian.  No family members at bedside.  Patient endorsed that she had 2 episodes of vomiting today.  Resting comfortably during my examination.  No evidence of seizures or vomiting after her ER arrival  HOSPITAL COURSE:  Patient was admitted to stepdown unit for severe hyponatremia.  She was started on hypertonic saline.  Nephrology consultation was done along with ICU consultation.  Patient received proton pump inhibitor for gastritis.  She continued sliding scale coverage with insulin for diabetes mellitus.  Patient received lithium for bipolar disorder.  Her sodium levels improved slowly.  Hypertonic saline was discontinued.  Seizures completely resolved.  Patient transferred to medical floor.  She was treated with  Zithromax antibiotics for bronchitis.  Flu test was negative.  MRSA PCR was negative blood cultures did not reveal any growth.  The time of discharge sodium is normal.  Patient will be discharged back to group home.  CONSULTS OBTAINED:  Treatment Team:  Lamont DowdyKolluru, Sarath, MD  DRUG ALLERGIES:   Allergies  Allergen Reactions  . Penicillins Rash    Has patient had a PCN reaction causing immediate rash, facial/tongue/throat swelling, SOB or lightheadedness with hypotension: No Has patient had a PCN reaction causing severe rash involving mucus membranes or skin necrosis: No Has patient had a PCN reaction that required hospitalization: No Has patient had a PCN reaction occurring within the last 10 years: No If all of the above answers are "NO", then may proceed with Cephalosporin use.   . Prednisone Rash    DISCHARGE MEDICATIONS:   Allergies as of 10/30/2018      Reactions   Penicillins Rash   Has patient had a PCN reaction causing immediate rash, facial/tongue/throat swelling, SOB or lightheadedness with hypotension: No Has patient had a PCN reaction causing severe rash involving mucus membranes or skin necrosis: No Has patient had a PCN reaction that required hospitalization: No Has patient had a PCN reaction occurring within the last 10 years: No If all of the above answers are "NO", then may proceed with Cephalosporin use.   Prednisone Rash      Medication List    STOP taking these medications   benztropine 1 MG tablet Commonly known as:  COGENTIN   loratadine 10 MG tablet Commonly known as:  CLARITIN   methylPREDNISolone 4  MG Tbpk tablet Commonly known as:  MEDROL   nitrofurantoin (macrocrystal-monohydrate) 100 MG capsule Commonly known as:  MACROBID   nitrofurantoin 100 MG capsule Commonly known as:  MACRODANTIN   sulfamethoxazole-trimethoprim 800-160 MG tablet Commonly known as:  BACTRIM DS,SEPTRA DS     TAKE these medications   acetaminophen 500 MG  tablet Commonly known as:  TYLENOL Take 500-1,000 mg by mouth daily as needed.   albuterol 108 (90 Base) MCG/ACT inhaler Commonly known as:  PROVENTIL HFA;VENTOLIN HFA Inhale 4-6 puffs by mouth every 4 hours as needed for wheezing, cough, and/or shortness of breath   azithromycin 250 MG tablet Commonly known as:  ZITHROMAX Take one tablet orally daily for four days   benzonatate 100 MG capsule Commonly known as:  TESSALON Take 100 mg by mouth 3 (three) times daily as needed for cough.   budesonide-formoterol 80-4.5 MCG/ACT inhaler Commonly known as:  SYMBICORT Inhale 2 puffs into the lungs 2 (two) times daily.   busPIRone 30 MG tablet Commonly known as:  BUSPAR Take 30 mg by mouth 2 (two) times daily.   carvedilol 3.125 MG tablet Commonly known as:  COREG Take 3.125 mg by mouth 2 (two) times daily with a meal.   cetirizine 10 MG tablet Commonly known as:  ZYRTEC Take 10 mg by mouth daily.   diphenhydramine-acetaminophen 25-500 MG Tabs tablet Commonly known as:  TYLENOL PM Take 1 tablet by mouth at bedtime as needed.   empagliflozin 10 MG Tabs tablet Commonly known as:  JARDIANCE Take 10 mg by mouth daily.   Fish Oil 1000 MG Caps Take 1 capsule by mouth 2 (two) times daily.   fluticasone 50 MCG/ACT nasal spray Commonly known as:  FLONASE Place 1 spray into both nostrils daily.   guaiFENesin 100 MG/5ML Soln Commonly known as:  ROBITUSSIN Take 5 mLs (100 mg total) by mouth every 4 (four) hours as needed for cough or to loosen phlegm.   Levothyroxine Sodium 100 MCG Caps Take 100 mcg by mouth daily before breakfast.   lisinopril 2.5 MG tablet Commonly known as:  PRINIVIL,ZESTRIL Take 2.5 mg by mouth daily.   lithium carbonate 150 MG capsule Take by mouth.   metFORMIN 500 MG tablet Commonly known as:  GLUCOPHAGE Take 500 mg by mouth 2 (two) times daily with a meal.   pravastatin 40 MG tablet Commonly known as:  PRAVACHOL Take 40 mg by mouth daily.    predniSONE 10 MG tablet Commonly known as:  DELTASONE Take 1 tablet (10 mg total) by mouth daily. Label  & dispense according to the schedule below.  6 tablets day one, then 5 table day 2, then 4 tablets day 3, then 3 tablets day 4, 2 tablets day 5, then 1 tablet day 6, then stop   risperiDONE 3 MG tablet Commonly known as:  RISPERDAL Take 3 mg by mouth at bedtime.   rOPINIRole 1 MG tablet Commonly known as:  REQUIP Take 1 mg by mouth at bedtime.   tamsulosin 0.4 MG Caps capsule Commonly known as:  FLOMAX Take 1 capsule (0.4 mg total) by mouth daily.   Vitamin D3 125 MCG (5000 UT) Caps Take 1 capsule by mouth daily.       Today  Patient seen on the day of discharge Awake and no complaints Tolerating diet well Hemodynamically stable Be discharged back to group home Fluid restriction to 1800 mL/day  VITAL SIGNS:  Blood pressure 121/64, pulse 73, temperature 98.2 F (36.8 C), temperature source Oral,  resp. rate 16, height 5\' 2"  (1.575 m), weight 85.2 kg, SpO2 97 %.  I/O:    Intake/Output Summary (Last 24 hours) at 10/30/2018 1012 Last data filed at 10/29/2018 2034 Gross per 24 hour  Intake 600 ml  Output 850 ml  Net -250 ml    PHYSICAL EXAMINATION:  Physical Exam  GENERAL:  58 y.o.-year-old patient lying in the bed with no acute distress.  LUNGS: Normal breath sounds bilaterally, no wheezing, rales,rhonchi or crepitation. No use of accessory muscles of respiration.  CARDIOVASCULAR: S1, S2 normal. No murmurs, rubs, or gallops.  ABDOMEN: Soft, non-tender, non-distended. Bowel sounds present. No organomegaly or mass.  NEUROLOGIC: Moves all 4 extremities. PSYCHIATRIC: The patient is alert and oriented x 3.  SKIN: No obvious rash, lesion, or ulcer.   DATA REVIEW:   CBC Recent Labs  Lab 10/27/18 0331  WBC 6.2  HGB 9.9*  HCT 33.2*  PLT 156    Chemistries  Recent Labs  Lab 10/28/18 0331 10/30/18 0452  NA 138 140  K 5.2* 4.9  CL 106 104  CO2 27 31   GLUCOSE 118* 138*  BUN 16 25*  CREATININE 1.09* 0.87  CALCIUM 9.2 9.7  AST 23  --   ALT 15  --   ALKPHOS 77  --   BILITOT 0.6  --     Cardiac Enzymes Recent Labs  Lab 10/26/18 0841  TROPONINI <0.03    Microbiology Results  Results for orders placed or performed during the hospital encounter of 10/26/18  MRSA PCR Screening     Status: None   Collection Time: 10/26/18 12:23 PM  Result Value Ref Range Status   MRSA by PCR NEGATIVE NEGATIVE Final    Comment:        The GeneXpert MRSA Assay (FDA approved for NASAL specimens only), is one component of a comprehensive MRSA colonization surveillance program. It is not intended to diagnose MRSA infection nor to guide or monitor treatment for MRSA infections. Performed at [facility], [address] [place], [place] [address]   CULTURE, BLOOD (ROUTINE X 2) w Reflex to ID Panel     Status: None (Preliminary result)   Collection Time: 10/28/18  1:55 PM  Result Value Ref Range Status   Specimen Description BLOOD BLOOD RIGHT HAND  Final   Special Requests   Final    BOTTLES DRAWN AEROBIC AND ANAEROBIC Blood Culture adequate volume   Culture   Final    NO GROWTH 2 DAYS Performed at [facility], [address] [place], [place] [address]    Report Status PENDING  Incomplete  CULTURE, BLOOD (ROUTINE X 2) w Reflex to ID Panel     Status: None (Preliminary result)   Collection Time: 10/28/18  1:55 PM  Result Value Ref Range Status   Specimen Description BLOOD BLOOD RIGHT FOREARM  Final   Special Requests   Final    BOTTLES DRAWN AEROBIC AND ANAEROBIC Blood Culture adequate volume   Culture   Final    NO GROWTH 2 DAYS Performed at [facility], [address] [place], [place] [address]    Report Status PENDING  Incomplete    RADIOLOGY:  Dg Chest Port 1 View  Result Date: 10/28/2018 CLINICAL DATA:  Fevers EXAM: PORTABLE CHEST 1 VIEW COMPARISON:  10/15/2017 FINDINGS: Cardiac shadows within  normal limits. Aortic calcifications are again seen. The lungs are well aerated without focal infiltrate. No bony abnormality is seen. IMPRESSION: No acute abnormality noted. Electronically  Signed   By: Alcide CleverMark  Lukens M.D.   On: 10/28/2018 14:27    Follow up with PCP in 1 week.  Management plans discussed with the patient, family and they are in agreement.  CODE STATUS: Full code    Code Status Orders  (From admission, onward)         Start     Ordered   10/26/18 1244  Full code  Continuous     10/26/18 1243        Code Status History    Date Active Date Inactive Code Status Order ID Comments User Context   06/23/2017 1944 06/25/2017 1640 Full Code [id number]  Altamese DillingVachhani, Vaibhavkumar, MD Inpatient   03/15/2017 1745 03/17/2017 2024 Full Code [id number]  Altamese DillingVachhani, Vaibhavkumar, MD Inpatient      TOTAL TIME TAKING CARE OF THIS PATIENT ON DAY OF DISCHARGE: more than 35 minutes.   Ihor AustinPavan  M.D on 10/30/2018 at 10:12 AM  Between 7am to 6pm - Pager - [phone]  After 6pm go to [email or web address] - password [email or web address] [email or web address]  [facility]  Office  [phone]  CC: Primary care physician; Armando GangLindley, Cheryl P, FNP  Note: This dictation was prepared with Dragon dictation along with smaller phrase technology. Any transcriptional errors that result from this process are unintentional.

## 2018-11-02 LAB — CULTURE, BLOOD (ROUTINE X 2)
Culture: NO GROWTH
Culture: NO GROWTH
SPECIAL REQUESTS: ADEQUATE
Special Requests: ADEQUATE

## 2018-11-10 ENCOUNTER — Other Ambulatory Visit: Payer: Self-pay

## 2018-11-10 ENCOUNTER — Inpatient Hospital Stay
Admission: EM | Admit: 2018-11-10 | Discharge: 2018-11-12 | DRG: 193 | Disposition: A | Discharge status: Home / Self Care | Payer: Medicare Other | Attending: Internal Medicine | Admitting: Internal Medicine

## 2018-11-10 ENCOUNTER — Emergency Department: Payer: Medicare Other

## 2018-11-10 DIAGNOSIS — Z79899 Other long term (current) drug therapy: Secondary | ICD-10-CM

## 2018-11-10 DIAGNOSIS — Z888 Allergy status to other drugs, medicaments and biological substances status: Secondary | ICD-10-CM | POA: Diagnosis not present

## 2018-11-10 DIAGNOSIS — J101 Influenza due to other identified influenza virus with other respiratory manifestations: Principal | ICD-10-CM | POA: Diagnosis present

## 2018-11-10 DIAGNOSIS — Z23 Encounter for immunization: Secondary | ICD-10-CM

## 2018-11-10 DIAGNOSIS — F0390 Unspecified dementia without behavioral disturbance: Secondary | ICD-10-CM | POA: Diagnosis present

## 2018-11-10 DIAGNOSIS — Z8249 Family history of ischemic heart disease and other diseases of the circulatory system: Secondary | ICD-10-CM | POA: Diagnosis not present

## 2018-11-10 DIAGNOSIS — Z7984 Long term (current) use of oral hypoglycemic drugs: Secondary | ICD-10-CM | POA: Diagnosis not present

## 2018-11-10 DIAGNOSIS — Z7951 Long term (current) use of inhaled steroids: Secondary | ICD-10-CM

## 2018-11-10 DIAGNOSIS — F419 Anxiety disorder, unspecified: Secondary | ICD-10-CM | POA: Diagnosis present

## 2018-11-10 DIAGNOSIS — Z7952 Long term (current) use of systemic steroids: Secondary | ICD-10-CM

## 2018-11-10 DIAGNOSIS — E119 Type 2 diabetes mellitus without complications: Secondary | ICD-10-CM | POA: Diagnosis present

## 2018-11-10 DIAGNOSIS — I110 Hypertensive heart disease with heart failure: Secondary | ICD-10-CM | POA: Diagnosis present

## 2018-11-10 DIAGNOSIS — J441 Chronic obstructive pulmonary disease with (acute) exacerbation: Secondary | ICD-10-CM | POA: Diagnosis present

## 2018-11-10 DIAGNOSIS — G40909 Epilepsy, unspecified, not intractable, without status epilepticus: Secondary | ICD-10-CM | POA: Diagnosis present

## 2018-11-10 DIAGNOSIS — Z9981 Dependence on supplemental oxygen: Secondary | ICD-10-CM | POA: Diagnosis not present

## 2018-11-10 DIAGNOSIS — F319 Bipolar disorder, unspecified: Secondary | ICD-10-CM | POA: Diagnosis present

## 2018-11-10 DIAGNOSIS — I5032 Chronic diastolic (congestive) heart failure: Secondary | ICD-10-CM | POA: Diagnosis present

## 2018-11-10 DIAGNOSIS — Z87891 Personal history of nicotine dependence: Secondary | ICD-10-CM

## 2018-11-10 DIAGNOSIS — J9601 Acute respiratory failure with hypoxia: Secondary | ICD-10-CM | POA: Diagnosis present

## 2018-11-10 DIAGNOSIS — Z79890 Hormone replacement therapy (postmenopausal): Secondary | ICD-10-CM

## 2018-11-10 DIAGNOSIS — Z880 Allergy status to penicillin: Secondary | ICD-10-CM | POA: Diagnosis not present

## 2018-11-10 LAB — CBC WITH DIFFERENTIAL/PLATELET
Abs Immature Granulocytes: 0.03 10*3/uL (ref 0.00–0.07)
Basophils Absolute: 0 10*3/uL (ref 0.0–0.1)
Basophils Relative: 0 %
EOS ABS: 0.1 10*3/uL (ref 0.0–0.5)
Eosinophils Relative: 1 %
HCT: 40.4 % (ref 36.0–46.0)
Hemoglobin: 11.9 g/dL — ABNORMAL LOW (ref 12.0–15.0)
IMMATURE GRANULOCYTES: 0 %
Lymphocytes Relative: 4 %
Lymphs Abs: 0.4 10*3/uL — ABNORMAL LOW (ref 0.7–4.0)
MCH: 24 pg — ABNORMAL LOW (ref 26.0–34.0)
MCHC: 29.5 g/dL — ABNORMAL LOW (ref 30.0–36.0)
MCV: 81.5 fL (ref 80.0–100.0)
Monocytes Absolute: 0.4 10*3/uL (ref 0.1–1.0)
Monocytes Relative: 4 %
NEUTROS PCT: 91 %
Neutro Abs: 8.7 10*3/uL — ABNORMAL HIGH (ref 1.7–7.7)
PLATELETS: 273 10*3/uL (ref 150–400)
RBC: 4.96 MIL/uL (ref 3.87–5.11)
RDW: 18.3 % — ABNORMAL HIGH (ref 11.5–15.5)
WBC: 9.6 10*3/uL (ref 4.0–10.5)
nRBC: 0 % (ref 0.0–0.2)

## 2018-11-10 LAB — BASIC METABOLIC PANEL
Anion gap: 6 (ref 5–15)
BUN: 12 mg/dL (ref 6–20)
CO2: 25 mmol/L (ref 22–32)
Calcium: 9.5 mg/dL (ref 8.9–10.3)
Chloride: 101 mmol/L (ref 98–111)
Creatinine, Ser: 1.19 mg/dL — ABNORMAL HIGH (ref 0.44–1.00)
GFR calc Af Amer: 59 mL/min — ABNORMAL LOW (ref >60)
GFR calc non Af Amer: 51 mL/min — ABNORMAL LOW (ref >60)
Glucose, Bld: 193 mg/dL — ABNORMAL HIGH (ref 70–99)
Potassium: 4.9 mmol/L (ref 3.5–5.1)
Sodium: 132 mmol/L — ABNORMAL LOW (ref 135–145)

## 2018-11-10 LAB — INFLUENZA PANEL BY PCR (TYPE A & B)
Influenza A By PCR: POSITIVE — AB
Influenza B By PCR: NEGATIVE

## 2018-11-10 MED ORDER — FLUTICASONE PROPIONATE 50 MCG/ACT NA SUSP
1.0000 | Freq: Every day | NASAL | Status: DC
  Administered 2018-11-11 – 2018-11-12 (×2): 1 via NASAL
  Filled 2018-11-10: qty 16

## 2018-11-10 MED ORDER — OSELTAMIVIR PHOSPHATE 75 MG PO CAPS
75.0000 mg | ORAL_CAPSULE | Freq: Once | ORAL | Status: AC
  Administered 2018-11-10: 75 mg via ORAL
  Filled 2018-11-10: qty 1

## 2018-11-10 MED ORDER — ALBUTEROL SULFATE (2.5 MG/3ML) 0.083% IN NEBU: 2.5000 mg | INHALATION_SOLUTION | RESPIRATORY_TRACT | Status: DC | PRN

## 2018-11-10 MED ORDER — METHYLPREDNISOLONE SODIUM SUCC 125 MG IJ SOLR
60.0000 mg | Freq: Two times a day (BID) | INTRAMUSCULAR | Status: DC
  Administered 2018-11-11: 60 mg via INTRAVENOUS
  Filled 2018-11-10: qty 2

## 2018-11-10 MED ORDER — LITHIUM CARBONATE 150 MG PO CAPS
150.0000 mg | ORAL_CAPSULE | Freq: Two times a day (BID) | ORAL | Status: DC
  Administered 2018-11-11 – 2018-11-12 (×3): 150 mg via ORAL
  Filled 2018-11-10 (×4): qty 1

## 2018-11-10 MED ORDER — LISINOPRIL 2.5 MG PO TABS
2.5000 mg | ORAL_TABLET | Freq: Every day | ORAL | Status: DC
  Administered 2018-11-11 – 2018-11-12 (×2): 2.5 mg via ORAL
  Filled 2018-11-10 (×2): qty 1

## 2018-11-10 MED ORDER — ENOXAPARIN SODIUM 40 MG/0.4ML ~~LOC~~ SOLN
40.0000 mg | SUBCUTANEOUS | Status: DC
  Administered 2018-11-10 – 2018-11-11 (×2): 40 mg via SUBCUTANEOUS
  Filled 2018-11-10 (×2): qty 0.4

## 2018-11-10 MED ORDER — CANAGLIFLOZIN 100 MG PO TABS: 100.0000 mg | ORAL_TABLET | Freq: Every day | ORAL | Status: DC

## 2018-11-10 MED ORDER — PNEUMOCOCCAL VAC POLYVALENT 25 MCG/0.5ML IJ INJ
0.5000 mL | INJECTION | INTRAMUSCULAR | Status: AC
  Administered 2018-11-11: 0.5 mL via INTRAMUSCULAR
  Filled 2018-11-10: qty 0.5

## 2018-11-10 MED ORDER — OSELTAMIVIR PHOSPHATE 30 MG PO CAPS
30.0000 mg | ORAL_CAPSULE | Freq: Two times a day (BID) | ORAL | Status: DC
  Administered 2018-11-11 – 2018-11-12 (×3): 30 mg via ORAL
  Filled 2018-11-10 (×4): qty 1

## 2018-11-10 MED ORDER — GUAIFENESIN 100 MG/5ML PO SOLN
5.0000 mL | ORAL | Status: DC | PRN
  Administered 2018-11-11 (×2): 100 mg via ORAL
  Filled 2018-11-10 (×4): qty 5

## 2018-11-10 MED ORDER — BENZONATATE 100 MG PO CAPS
100.0000 mg | ORAL_CAPSULE | Freq: Three times a day (TID) | ORAL | Status: DC | PRN
  Administered 2018-11-10 – 2018-11-11 (×3): 100 mg via ORAL
  Filled 2018-11-10 (×3): qty 1

## 2018-11-10 MED ORDER — POLYETHYLENE GLYCOL 3350 17 G PO PACK: 17.0000 g | PACK | Freq: Every day | ORAL | Status: DC | PRN

## 2018-11-10 MED ORDER — METHYLPREDNISOLONE SODIUM SUCC 125 MG IJ SOLR
125.0000 mg | Freq: Once | INTRAMUSCULAR | Status: AC
  Administered 2018-11-10: 125 mg via INTRAVENOUS
  Filled 2018-11-10: qty 2

## 2018-11-10 MED ORDER — LORATADINE 10 MG PO TABS
10.0000 mg | ORAL_TABLET | Freq: Every day | ORAL | Status: DC
  Administered 2018-11-11 – 2018-11-12 (×2): 10 mg via ORAL
  Filled 2018-11-10 (×2): qty 1

## 2018-11-10 MED ORDER — BUSPIRONE HCL 15 MG PO TABS
30.0000 mg | ORAL_TABLET | Freq: Two times a day (BID) | ORAL | Status: DC
  Administered 2018-11-10 – 2018-11-12 (×4): 30 mg via ORAL
  Filled 2018-11-10 (×5): qty 2

## 2018-11-10 MED ORDER — ALBUTEROL SULFATE (2.5 MG/3ML) 0.083% IN NEBU
5.0000 mg | INHALATION_SOLUTION | Freq: Once | RESPIRATORY_TRACT | Status: AC
  Administered 2018-11-10: 5 mg via RESPIRATORY_TRACT
  Filled 2018-11-10: qty 6

## 2018-11-10 MED ORDER — KETOROLAC TROMETHAMINE 30 MG/ML IJ SOLN
15.0000 mg | Freq: Once | INTRAMUSCULAR | Status: AC
  Administered 2018-11-10: 15 mg via INTRAVENOUS
  Filled 2018-11-10: qty 1

## 2018-11-10 MED ORDER — ACETAMINOPHEN 325 MG PO TABS
650.0000 mg | ORAL_TABLET | Freq: Four times a day (QID) | ORAL | Status: DC | PRN
  Administered 2018-11-10 – 2018-11-11 (×2): 650 mg via ORAL
  Filled 2018-11-10 (×3): qty 2

## 2018-11-10 MED ORDER — LEVOTHYROXINE SODIUM 100 MCG PO TABS
100.0000 ug | ORAL_TABLET | Freq: Every day | ORAL | Status: DC
  Administered 2018-11-11 – 2018-11-12 (×2): 100 ug via ORAL
  Filled 2018-11-10 (×2): qty 1

## 2018-11-10 MED ORDER — RISPERIDONE 3 MG PO TABS
3.0000 mg | ORAL_TABLET | Freq: Every day | ORAL | Status: DC
  Administered 2018-11-10 – 2018-11-11 (×2): 3 mg via ORAL
  Filled 2018-11-10 (×3): qty 1

## 2018-11-10 MED ORDER — MOMETASONE FURO-FORMOTEROL FUM 100-5 MCG/ACT IN AERO
2.0000 | INHALATION_SPRAY | Freq: Two times a day (BID) | RESPIRATORY_TRACT | Status: DC
  Administered 2018-11-10 – 2018-11-12 (×4): 2 via RESPIRATORY_TRACT
  Filled 2018-11-10: qty 8.8

## 2018-11-10 MED ORDER — ROPINIROLE HCL 1 MG PO TABS
1.0000 mg | ORAL_TABLET | Freq: Every day | ORAL | Status: DC
  Administered 2018-11-10 – 2018-11-11 (×2): 1 mg via ORAL
  Filled 2018-11-10 (×2): qty 1

## 2018-11-10 MED ORDER — SODIUM CHLORIDE 0.9 % IV SOLN
1.0000 g | Freq: Once | INTRAVENOUS | Status: AC
  Administered 2018-11-10: 1 g via INTRAVENOUS
  Filled 2018-11-10: qty 10

## 2018-11-10 MED ORDER — CARVEDILOL 6.25 MG PO TABS
3.1250 mg | ORAL_TABLET | Freq: Two times a day (BID) | ORAL | Status: DC
  Administered 2018-11-11 – 2018-11-12 (×3): 3.125 mg via ORAL
  Filled 2018-11-10 (×3): qty 1

## 2018-11-10 MED ORDER — ACETAMINOPHEN 650 MG RE SUPP: 650.0000 mg | Freq: Four times a day (QID) | RECTAL | Status: DC | PRN

## 2018-11-10 MED ORDER — ONDANSETRON HCL 4 MG PO TABS: 4.0000 mg | ORAL_TABLET | Freq: Four times a day (QID) | ORAL | Status: DC | PRN

## 2018-11-10 MED ORDER — ONDANSETRON HCL 4 MG/2ML IJ SOLN: 4.0000 mg | Freq: Four times a day (QID) | INTRAMUSCULAR | Status: DC | PRN

## 2018-11-10 MED ORDER — PRAVASTATIN SODIUM 20 MG PO TABS
40.0000 mg | ORAL_TABLET | Freq: Every day | ORAL | Status: DC
  Administered 2018-11-11 – 2018-11-12 (×2): 40 mg via ORAL
  Filled 2018-11-10 (×2): qty 2

## 2018-11-10 MED ORDER — TAMSULOSIN HCL 0.4 MG PO CAPS
0.4000 mg | ORAL_CAPSULE | Freq: Every day | ORAL | Status: DC
  Administered 2018-11-11 – 2018-11-12 (×2): 0.4 mg via ORAL
  Filled 2018-11-10 (×2): qty 1

## 2018-11-10 MED ORDER — SODIUM CHLORIDE 0.9% FLUSH
3.0000 mL | Freq: Two times a day (BID) | INTRAVENOUS | Status: DC
  Administered 2018-11-10 – 2018-11-12 (×4): 3 mL via INTRAVENOUS

## 2018-11-10 NOTE — ED Notes (Signed)
Brittany RN, aware of bed assigned  

## 2018-11-10 NOTE — Progress Notes (Signed)
PHARMACIST - PHYSICIAN COMMUNICATION  CONCERNING:  Oseltamivir (Tamiflu) Dose  DESCRIPTION:  This patient has an order for Oseltamivir (Tamiflu) 75mg  BID and has an Estimated Creatinine Clearance: 51.6 mL/min (A) (by C-G formula based on SCr of 1.19 mg/dL (H))..  The package insert for Oseltamivir recommends 30mg  BID x 5 days for patients with CrCl 30-60 ml/min.  RECOMMENDATION: The patient's dose has been changed to Oseltamivir 30mg  PO BID x 5 days.   Gardner Candle, PharmD, BCPS Clinical Pharmacist 11/10/2018 9:31 PM

## 2018-11-10 NOTE — ED Notes (Signed)
Pt is resting in bed. Respirations even/unlabored. Nadn.   

## 2018-11-10 NOTE — ED Notes (Signed)
Pt cleared to eat and drink by ED MD. Pt given sandwich tray and drink by ED tech at this time

## 2018-11-10 NOTE — ED Notes (Signed)
Report was called and given to the floor.   

## 2018-11-10 NOTE — ED Triage Notes (Signed)
Pt arrives via ems from group home [facility] Ems states facility called due to increase in anxiety medication not working. Ems states pt was complaining of SOB with wheezing on right side, non productive dry cough. Ems administered 1 duoned prior to arrival and reports decrease in pt wheezing. Pt A&O x 4 on arrival. Pt is on 2L oxygen chronically.

## 2018-11-10 NOTE — ED Provider Notes (Signed)
[facility] Emergency Department Provider Note  ____________________________________________  Time seen: Approximately 2:27 PM  I have reviewed the triage vital signs and the nursing notes.   HISTORY  Chief Complaint Shortness of Breath    HPI Tammy [name] is a 58 y.o. female with a history of asthma bipolar disorder CHF COPD and dementia who was sent to the ED from her group home due to shortness of breath that started this morning.  Not feeling better with home medications.  EMS noted wheezing and gave 1 DuoNeb prior to arrival which they reported improvement in her symptoms.  Patient still feels very short of breath without aggravating or alleviating factors.  Oxygen saturation was 90% on her chronic 2 L nasal cannula.  Denies chest pain.      Past Medical History:  Diagnosis Date  . Anxiety   . Asthma   . Bipolar 1 disorder (HCC)   . CHF (congestive heart failure) (HCC)   . COPD (chronic obstructive pulmonary disease) (HCC)   . Dementia (HCC)   . Epilepsy (HCC)   . Seizures [facility])      Patient Active Problem List   Diagnosis Date Noted  . Hyponatremia 10/26/2018  . Syncopal episodes 06/23/2017  . Acute on chronic respiratory failure with hypoxia (HCC) 03/15/2017  . COPD with acute exacerbation (HCC) 03/15/2017  . Acute on chronic respiratory failure (HCC) 03/15/2017     Past Surgical History:  Procedure Laterality Date  . APPENDECTOMY    . hernia reapir     x 3       Prior to Admission medications   Medication Sig Start Date End Date Taking? Authorizing Provider  acetaminophen (TYLENOL) 500 MG tablet Take 500-1,000 mg by mouth daily as needed.    Yes [provider]  albuterol (PROVENTIL HFA;VENTOLIN HFA) 108 (90 BASE) MCG/ACT inhaler Inhale 4-6 puffs by mouth every 4 hours as needed for wheezing, cough, and/or shortness of breath 07/01/15  Yes Loleta Rose, MD  azithromycin (ZITHROMAX) 250 MG tablet Take one tablet  orally daily for four days 10/30/18  Yes Pyreddy, Vivien Rota, MD  benzonatate (TESSALON) 100 MG capsule Take 100 mg by mouth 3 (three) times daily as needed for cough.   Yes [provider]  budesonide-formoterol (SYMBICORT) 80-4.5 MCG/ACT inhaler Inhale 2 puffs into the lungs 2 (two) times daily.   Yes [provider]  busPIRone (BUSPAR) 30 MG tablet Take 30 mg by mouth 2 (two) times daily.    Yes [provider]  carvedilol (COREG) 3.125 MG tablet Take 3.125 mg by mouth 2 (two) times daily with a meal.    Yes [provider]  cetirizine (ZYRTEC) 10 MG tablet Take 10 mg by mouth daily.   Yes [provider]  Cholecalciferol (VITAMIN D3) 5000 units CAPS Take 1 capsule by mouth daily.   Yes [provider]  diphenhydramine-acetaminophen (TYLENOL PM) 25-500 MG TABS tablet Take 1 tablet by mouth at bedtime as needed.   Yes [provider]  empagliflozin (JARDIANCE) 10 MG TABS tablet Take 10 mg by mouth daily.    Yes [provider]  fluticasone (FLONASE) 50 MCG/ACT nasal spray Place 1 spray into both nostrils daily.    Yes [provider]  guaiFENesin (ROBITUSSIN) 100 MG/5ML SOLN Take 5 mLs (100 mg total) by mouth every 4 (four) hours as needed for cough or to loosen phlegm. 08/09/17  Yes Sharman Cheek, MD  levothyroxine (SYNTHROID, LEVOTHROID) 100 MCG tablet Take 100 mcg  by mouth daily before breakfast.    Yes [provider]  lisinopril (PRINIVIL,ZESTRIL) 2.5 MG tablet Take 2.5 mg by mouth daily.   Yes [provider]  lithium carbonate 150 MG capsule Take by mouth.   Yes [provider]  metFORMIN (GLUCOPHAGE) 500 MG tablet Take 500 mg by mouth 2 (two) times daily with a meal.   Yes [provider]  Omega-3 Fatty Acids (FISH OIL) 1000 MG CAPS Take 1 capsule by mouth 2 (two) times daily.   Yes [provider]  pravastatin (PRAVACHOL) 40 MG tablet Take 40 mg by mouth daily.   Yes  [provider]  predniSONE (DELTASONE) 10 MG tablet Take 1 tablet (10 mg total) by mouth daily. Label  & dispense according to the schedule below.  6 tablets day one, then 5 table day 2, then 4 tablets day 3, then 3 tablets day 4, 2 tablets day 5, then 1 tablet day 6, then stop 10/30/18  Yes Pyreddy, Pavan, MD  risperiDONE (RISPERDAL) 3 MG tablet Take 3 mg by mouth at bedtime.    Yes [provider]  rOPINIRole (REQUIP) 1 MG tablet Take 1 mg by mouth at bedtime.   Yes [provider]  tamsulosin (FLOMAX) 0.4 MG CAPS capsule Take 1 capsule (0.4 mg total) by mouth daily. 03/25/17  Yes McGowan, Carollee Herter A, PA-C     Allergies Penicillins and Prednisone   Family History  Problem Relation Age of Onset  . Hypertension Mother   . Kidney cancer Neg Hx   . Bladder Cancer Neg Hx     Social History Social History   Tobacco Use  . Smoking status: Former Smoker    Last attempt to quit: 10/07/1998    Years since quitting: 20.1  . Smokeless tobacco: Never Used  Substance Use Topics  . Alcohol use: No  . Drug use: No    Review of Systems  Constitutional:   No fever or chills.  ENT:   No sore throat. No rhinorrhea. Cardiovascular:   No chest pain or syncope. Respiratory: Positive shortness of breath and nonproductive cough. Gastrointestinal:   Negative for abdominal pain, vomiting and diarrhea.  Musculoskeletal:   Negative for focal pain or swelling All other systems reviewed and are negative except as documented above in ROS and HPI.  ____________________________________________   PHYSICAL EXAM:  VITAL SIGNS: ED Triage Vitals  Enc Vitals Group     BP 11/10/18 1412 106/85     Pulse Rate 11/10/18 1412 (!) 109     Resp 11/10/18 1412 20     Temp 11/10/18 1412 99.8 F (37.7 C)     Temp Source 11/10/18 1412 Oral     SpO2 11/10/18 1406 90 %     Weight 11/10/18 1413 180 lb (81.6 kg)     Height 11/10/18 1413 5\' 2"  (1.575 m)     Head Circumference --      Peak  Flow --      Pain Score 11/10/18 1413 6     Pain Loc --      Pain Edu? --      Excl. in GC? --     Vital signs reviewed, nursing assessments reviewed.   Constitutional:   Alert and oriented.  Ill-appearing. Eyes:   Conjunctivae are normal. EOMI. PERRL. ENT      Head:   Normocephalic and atraumatic.      Nose:   No congestion/rhinnorhea.       Mouth/Throat:  Dry mucous membranes, no pharyngeal erythema. No peritonsillar mass.       Neck:   No meningismus. Full ROM. Hematological/Lymphatic/Immunilogical:   No cervical lymphadenopathy. Cardiovascular:   Tachycardia heart rate 110. Symmetric bilateral radial and DP pulses.  No murmurs. Cap refill less than 2 seconds. Respiratory:   Normal respiratory effort without tachypnea/retractions.  Diffuse expiratory wheezing.  Prolonged expiratory phase. Gastrointestinal:   Soft and nontender. Non distended.No rebound, rigidity, or guarding. Musculoskeletal:   Normal range of motion in all extremities. No joint effusions.  No lower extremity tenderness.  No edema. Neurologic:   Normal speech and language.  Motor grossly intact. No acute focal neurologic deficits are appreciated.  Skin:    Skin is warm, dry and intact. No rash noted.  No petechiae, purpura, or bullae.  ____________________________________________    LABS (pertinent positives/negatives) (all labs ordered are listed, but only abnormal results are displayed) Labs Reviewed  BASIC METABOLIC PANEL - Abnormal; Notable for the following components:      Result Value   Sodium 132 (*)    Glucose, Bld 193 (*)    Creatinine, Ser 1.19 (*)    GFR calc non Af Amer 51 (*)    GFR calc Af Amer 59 (*)    All other components within normal limits  CBC WITH DIFFERENTIAL/PLATELET - Abnormal; Notable for the following components:   Hemoglobin 11.9 (*)    MCH 24.0 (*)    MCHC 29.5 (*)    RDW 18.3 (*)    Neutro Abs 8.7 (*)    Lymphs Abs 0.4 (*)    All other components within normal limits   INFLUENZA PANEL BY PCR (TYPE A & B) - Abnormal; Notable for the following components:   Influenza A By PCR POSITIVE (*)    All other components within normal limits   ____________________________________________   EKG    ____________________________________________    RADIOLOGY  Dg Chest 2 View  Result Date: 11/10/2018 CLINICAL DATA:  Shortness of breath. EXAM: CHEST - 2 VIEW COMPARISON:  Radiograph October 28, 2018. FINDINGS: Stable cardiomegaly. Atherosclerosis of thoracic aorta is noted. No pneumothorax or pleural effusion is noted. No acute pulmonary disease is noted. Bony thorax is unremarkable IMPRESSION: No active cardiopulmonary disease. Aortic Atherosclerosis (ICD10-I70.0). Electronically Signed   By: Lupita RaiderJames  Green Jr, M.D.   On: 11/10/2018 16:01   Ct Chest Wo Contrast  Result Date: 11/10/2018 CLINICAL DATA:  58 year old female with acute shortness of breath, wheezing and cough. EXAM: CT CHEST WITHOUT CONTRAST TECHNIQUE: Multidetector CT imaging of the chest was performed following the standard protocol without IV contrast. COMPARISON:  08/09/2017 FINDINGS: Cardiovascular: Heart size normal. Coronary artery and aortic atherosclerotic calcifications noted without thoracic aortic aneurysm. No pericardial effusion. Mediastinum/Nodes: No enlarged mediastinal or axillary lymph nodes. Thyroid gland, trachea, and esophagus demonstrate no significant findings. Lungs/Pleura: No airspace disease, consolidation, suspicious nodule, mass, pleural effusion or pneumothorax. Mild centrilobular emphysema noted. Upper Abdomen: No acute abnormality. Cholecystectomy and gastric surgical changes again noted. Musculoskeletal: No acute or suspicious bony abnormalities. IMPRESSION: 1. No evidence of acute abnormality. 2. Coronary artery and Aortic Atherosclerosis (ICD10-I70.0) and Emphysema (ICD10-J43.9). Electronically Signed   By: Harmon PierJeffrey  Hu M.D.   On: 11/10/2018 16:48     ____________________________________________   PROCEDURES Procedures  ____________________________________________  DIFFERENTIAL DIAGNOSIS   Pneumonia, pneumothorax, COPD exacerbation.  Doubt ACS PE dissection AAA pneumothorax pericarditis.  CLINICAL IMPRESSION / ASSESSMENT AND PLAN / ED COURSE  Pertinent labs & imaging results that were available during  my care of the patient were reviewed by me and considered in my medical decision making (see chart for details).    Patient presents with shortness of breath and wheezing.  Some mild improvement after a DuoNeb from EMS but still significantly wheezy with bronchospasm on exam.  Will give additional albuterol, Solu-Medrol.  Check labs especially with her history of hyponatremia and a chest x-ray.  Clinical Course as of Nov 11 1951  Tue Nov 10, 2018  1602 Now with fever.  No obvious pneumonia on x-ray, we will follow-up radiology report.  May need to order a flu swab.  Differential is neutrophil dominant although overall not elevated.   [PS]  1622 [place] unremarkable.  With her severe COPD and now developing a fever, will check a noncontrast CT scan of the chest as well as a flu test.   [PS]  1926 Influenza A positive.  Start Tamiflu.  With her fever, borderline hypoxia, chronic lung disease, I will plan to hospitalize for further treatment until stability can be assured.   [PS]    Clinical Course User Index [PS] Sharman CheekStafford, Phoenix Riesen, MD     ____________________________________________   FINAL CLINICAL IMPRESSION(S) / ED DIAGNOSES    Final diagnoses:  COPD exacerbation (HCC)  Influenza A     ED Discharge Orders    None      Portions of this note were generated with dragon dictation software. Dictation errors may occur despite best attempts at proofreading.   Sharman CheekStafford, Davine Sweney, MD 11/10/18 [id number]

## 2018-11-10 NOTE — H&P (Signed)
[facility] - [place] [facility]   PATIENT NAME: Tammy MaduraBrenda [name]    MR#:  [id number]  DATE OF BIRTH:  1961-06-28  DATE OF ADMISSION:  11/10/2018  PRIMARY CARE PHYSICIAN: Armando GangLindley, Cheryl P, FNP   REQUESTING/REFERRING PHYSICIAN: Dr. Scotty CourtStafford  CHIEF COMPLAINT:   Chief Complaint  Patient presents with  . Shortness of Breath    HISTORY OF PRESENT ILLNESS:  Tammy [name]  is a 58 y.o. female with a known history of bipolar disorder, COPD, resident of local group home, chronic diastolic CHF, hypertension presents to the hospital complaining of 1 day of being ill, cough, shortness of breath and wheezy.  Patient is poor historian.  History obtained from ED staff and old records.  Patient does not smoke.  Here her saturations were found to be 90% on room air.  Multiple nebulizers, IV steroids used.  Continues to have wheezing and shortness of breath with extreme weakness.  Conversational dyspnea. Influenza A positive  PAST MEDICAL HISTORY:   Past Medical History:  Diagnosis Date  . Anxiety   . Asthma   . Bipolar 1 disorder (HCC)   . CHF (congestive heart failure) (HCC)   . COPD (chronic obstructive pulmonary disease) (HCC)   . Dementia (HCC)   . Epilepsy (HCC)   . Seizures (HCC)     PAST SURGICAL HISTORY:   Past Surgical History:  Procedure Laterality Date  . APPENDECTOMY    . hernia reapir     x 3      SOCIAL HISTORY:   Social History   Tobacco Use  . Smoking status: Former Smoker    Last attempt to quit: 10/07/1998    Years since quitting: 20.1  . Smokeless tobacco: Never Used  Substance Use Topics  . Alcohol use: No    FAMILY HISTORY:   Family History  Problem Relation Age of Onset  . Hypertension Mother   . Kidney cancer Neg Hx   . Bladder Cancer Neg Hx     DRUG ALLERGIES:   Allergies  Allergen Reactions  . Penicillins Rash    Has patient had a PCN reaction causing immediate rash, facial/tongue/throat swelling, SOB or  lightheadedness with hypotension: No Has patient had a PCN reaction causing severe rash involving mucus membranes or skin necrosis: No Has patient had a PCN reaction that required hospitalization: No Has patient had a PCN reaction occurring within the last 10 years: No If all of the above answers are "NO", then may proceed with Cephalosporin use.   . Prednisone Rash    REVIEW OF SYSTEMS:   Review of Systems  Constitutional: Positive for malaise/fatigue. Negative for chills and fever.  HENT: Negative for sore throat.   Eyes: Negative for blurred vision, double vision and pain.  Respiratory: Positive for cough, shortness of breath and wheezing. Negative for hemoptysis.   Cardiovascular: Negative for chest pain, palpitations, orthopnea and leg swelling.  Gastrointestinal: Negative for abdominal pain, constipation, diarrhea, heartburn, nausea and vomiting.  Genitourinary: Negative for dysuria and hematuria.  Musculoskeletal: Negative for back pain and joint pain.  Skin: Negative for rash.  Neurological: Negative for sensory change, speech change, focal weakness and headaches.  Endo/Heme/Allergies: Does not bruise/bleed easily.  Psychiatric/Behavioral: Negative for depression. The patient is not nervous/anxious.     MEDICATIONS AT HOME:   Prior to Admission medications   Medication Sig Start Date End Date Taking? Authorizing Provider  acetaminophen (TYLENOL) 500 MG tablet Take 500-1,000 mg by mouth daily as needed.  Yes [provider]  albuterol (PROVENTIL HFA;VENTOLIN HFA) 108 (90 BASE) MCG/ACT inhaler Inhale 4-6 puffs by mouth every 4 hours as needed for wheezing, cough, and/or shortness of breath 07/01/15  Yes Loleta RoseForbach, Cory, MD  azithromycin (ZITHROMAX) 250 MG tablet Take one tablet orally daily for four days 10/30/18  Yes Pyreddy, Vivien RotaPavan, MD  benzonatate (TESSALON) 100 MG capsule Take 100 mg by mouth 3 (three) times daily as needed for cough.   Yes [provider]   budesonide-formoterol (SYMBICORT) 80-4.5 MCG/ACT inhaler Inhale 2 puffs into the lungs 2 (two) times daily.   Yes [provider]  busPIRone (BUSPAR) 30 MG tablet Take 30 mg by mouth 2 (two) times daily.    Yes [provider]  carvedilol (COREG) 3.125 MG tablet Take 3.125 mg by mouth 2 (two) times daily with a meal.    Yes [provider]  cetirizine (ZYRTEC) 10 MG tablet Take 10 mg by mouth daily.   Yes [provider]  Cholecalciferol (VITAMIN D3) 5000 units CAPS Take 1 capsule by mouth daily.   Yes [provider]  diphenhydramine-acetaminophen (TYLENOL PM) 25-500 MG TABS tablet Take 1 tablet by mouth at bedtime as needed.   Yes [provider]  empagliflozin (JARDIANCE) 10 MG TABS tablet Take 10 mg by mouth daily.    Yes [provider]  fluticasone (FLONASE) 50 MCG/ACT nasal spray Place 1 spray into both nostrils daily.    Yes [provider]  guaiFENesin (ROBITUSSIN) 100 MG/5ML SOLN Take 5 mLs (100 mg total) by mouth every 4 (four) hours as needed for cough or to loosen phlegm. 08/09/17  Yes Sharman CheekStafford, Phillip, MD  levothyroxine (SYNTHROID, LEVOTHROID) 100 MCG tablet Take 100 mcg by mouth daily before breakfast.    Yes [provider]  lisinopril (PRINIVIL,ZESTRIL) 2.5 MG tablet Take 2.5 mg by mouth daily.   Yes [provider]  lithium carbonate 150 MG capsule Take by mouth.   Yes [provider]  metFORMIN (GLUCOPHAGE) 500 MG tablet Take 500 mg by mouth 2 (two) times daily with a meal.   Yes [provider]  Omega-3 Fatty Acids (FISH OIL) 1000 MG CAPS Take 1 capsule by mouth 2 (two) times daily.   Yes [provider]  pravastatin (PRAVACHOL) 40 MG tablet Take 40 mg by mouth daily.   Yes [provider]  predniSONE (DELTASONE) 10 MG tablet Take 1 tablet (10 mg total) by mouth daily. Label  & dispense according to the schedule below.  6 tablets day one, then 5 table day  2, then 4 tablets day 3, then 3 tablets day 4, 2 tablets day 5, then 1 tablet day 6, then stop 10/30/18  Yes Pyreddy, Pavan, MD  risperiDONE (RISPERDAL) 3 MG tablet Take 3 mg by mouth at bedtime.    Yes [provider]  rOPINIRole (REQUIP) 1 MG tablet Take 1 mg by mouth at bedtime.   Yes [provider]  tamsulosin (FLOMAX) 0.4 MG CAPS capsule Take 1 capsule (0.4 mg total) by mouth daily. 03/25/17  Yes McGowan, Carollee HerterShannon A, PA-C     VITAL SIGNS:  Blood pressure 132/68, pulse 96, temperature 99.8 F (37.7 C), temperature source Oral, resp. rate 20, height 5\' 2"  (1.575 m), weight 81.6 kg, SpO2 93 %.  PHYSICAL EXAMINATION:  Physical Exam  GENERAL:  58 y.o.-year-old patient lying in the bed with no acute distress.  EYES: Pupils equal, round, reactive to light and accommodation. No scleral icterus. Extraocular muscles intact.  HEENT: Head atraumatic, normocephalic. Oropharynx and nasopharynx clear. No oropharyngeal erythema, moist oral mucosa  NECK:  Supple, no jugular venous distention. No thyroid enlargement, no tenderness.  LUNGS: Bilateral wheezing and decreased air entry. CARDIOVASCULAR: S1, S2 normal. No murmurs, rubs, or gallops.  ABDOMEN: Soft, nontender, nondistended. Bowel sounds present. No organomegaly or mass.  EXTREMITIES: No pedal edema, cyanosis, or clubbing. + 2 pedal & radial pulses b/l.   NEUROLOGIC: Cranial nerves II through XII are intact. No focal Motor or sensory deficits appreciated b/l PSYCHIATRIC: The patient is alert and awake.  Anxious SKIN: No obvious rash, lesion, or ulcer.   LABORATORY PANEL:   CBC Recent Labs  Lab 11/10/18 1506  WBC 9.6  HGB 11.9*  HCT 40.4  PLT 273   ------------------------------------------------------------------------------------------------------------------  Chemistries  Recent Labs  Lab 11/10/18 1506  NA 132*  K 4.9  CL 101  CO2 25  GLUCOSE 193*  BUN 12  CREATININE 1.19*  CALCIUM 9.5    ------------------------------------------------------------------------------------------------------------------  Cardiac Enzymes No results for input(s): TROPONINI in the last 168 hours. ------------------------------------------------------------------------------------------------------------------  RADIOLOGY:  Dg Chest 2 View  Result Date: 11/10/2018 CLINICAL DATA:  Shortness of breath. EXAM: CHEST - 2 VIEW COMPARISON:  Radiograph October 28, 2018. FINDINGS: Stable cardiomegaly. Atherosclerosis of thoracic aorta is noted. No pneumothorax or pleural effusion is noted. No acute pulmonary disease is noted. Bony thorax is unremarkable IMPRESSION: No active cardiopulmonary disease. Aortic Atherosclerosis (ICD10-I70.0). Electronically Signed   By: Lupita Raider, M.D.   On: 11/10/2018 16:01   Ct Chest Wo Contrast  Result Date: 11/10/2018 CLINICAL DATA:  58 year old female with acute shortness of breath, wheezing and cough. EXAM: CT CHEST WITHOUT CONTRAST TECHNIQUE: Multidetector CT imaging of the chest was performed following the standard protocol without IV contrast. COMPARISON:  08/09/2017 FINDINGS: Cardiovascular: Heart size normal. Coronary artery and aortic atherosclerotic calcifications noted without thoracic aortic aneurysm. No pericardial effusion. Mediastinum/Nodes: No enlarged mediastinal or axillary lymph nodes. Thyroid gland, trachea, and esophagus demonstrate no significant findings. Lungs/Pleura: No airspace disease, consolidation, suspicious nodule, mass, pleural effusion or pneumothorax. Mild centrilobular emphysema noted. Upper Abdomen: No acute abnormality. Cholecystectomy and gastric surgical changes again noted. Musculoskeletal: No acute or suspicious bony abnormalities. IMPRESSION: 1. No evidence of acute abnormality. 2. Coronary artery and Aortic Atherosclerosis (ICD10-I70.0) and Emphysema (ICD10-J43.9). Electronically Signed   By: Harmon Pier M.D.   On: 11/10/2018 16:48      IMPRESSION AND PLAN:   * Influenza A with COPD exacerbation -IV steroids, Tamiflu - Scheduled Nebulizers - Inhalers -Wean O2 as tolerated - Consult pulmonary if no improvement  * Bipolar Continue lithium  * chronic diastolic CHF Continue home medications  *Hypertension.  Continue Coreg and lisinopril  DVT prophylaxis with Lovenox   All the records are reviewed and case discussed with ED provider. Management plans discussed with the patient, family and they are in agreement.  CODE STATUS: FULL CODE  TOTAL TIME TAKING CARE OF THIS PATIENT: 35 minutes.   Molinda Bailiff Myan Locatelli M.D on 11/10/2018 at 8:28 PM  Between 7am to 6pm - Pager - [phone]  After 6pm go to [email or web address] - password [email or web address] [facility]  [facility] [place] [facility]  Office  [phone]  CC: Primary care physician; Armando Gang, FNP  Note: This dictation was prepared with Dragon dictation along with smaller phrase technology. Any transcriptional errors that result from this process are unintentional.

## 2018-11-11 LAB — BASIC METABOLIC PANEL
Anion gap: 6 (ref 5–15)
BUN: 17 mg/dL (ref 6–20)
CO2: 21 mmol/L — ABNORMAL LOW (ref 22–32)
Calcium: 9.2 mg/dL (ref 8.9–10.3)
Chloride: 110 mmol/L (ref 98–111)
Creatinine, Ser: 1.25 mg/dL — ABNORMAL HIGH (ref 0.44–1.00)
GFR calc Af Amer: 55 mL/min — ABNORMAL LOW (ref >60)
GFR calc non Af Amer: 48 mL/min — ABNORMAL LOW (ref >60)
Glucose, Bld: 212 mg/dL — ABNORMAL HIGH (ref 70–99)
Potassium: 5.9 mmol/L — ABNORMAL HIGH (ref 3.5–5.1)
Sodium: 137 mmol/L (ref 135–145)

## 2018-11-11 LAB — CBC
HCT: 39.3 % (ref 36.0–46.0)
HEMOGLOBIN: 11.2 g/dL — AB (ref 12.0–15.0)
MCH: 23.9 pg — ABNORMAL LOW (ref 26.0–34.0)
MCHC: 28.5 g/dL — AB (ref 30.0–36.0)
MCV: 84 fL (ref 80.0–100.0)
Platelets: 280 10*3/uL (ref 150–400)
RBC: 4.68 MIL/uL (ref 3.87–5.11)
RDW: 18.8 % — ABNORMAL HIGH (ref 11.5–15.5)
WBC: 7.4 10*3/uL (ref 4.0–10.5)
nRBC: 0 % (ref 0.0–0.2)

## 2018-11-11 LAB — POTASSIUM: Potassium: 5.3 mmol/L — ABNORMAL HIGH (ref 3.5–5.1)

## 2018-11-11 LAB — GLUCOSE, CAPILLARY
GLUCOSE-CAPILLARY: 339 mg/dL — AB (ref 70–99)
Glucose-Capillary: 162 mg/dL — ABNORMAL HIGH (ref 70–99)
Glucose-Capillary: >600 mg/dL (ref 70–99)
Glucose-Capillary: 181 mg/dL — ABNORMAL HIGH (ref 70–99)

## 2018-11-11 MED ORDER — INSULIN REGULAR HUMAN 100 UNIT/ML IJ SOLN
25.0000 [IU] | Freq: Once | INTRAMUSCULAR | Status: AC
  Administered 2018-11-11: 25 [IU] via SUBCUTANEOUS
  Filled 2018-11-11: qty 10

## 2018-11-11 MED ORDER — INSULIN ASPART 100 UNIT/ML ~~LOC~~ SOLN
0.0000 [IU] | Freq: Three times a day (TID) | SUBCUTANEOUS | Status: DC
  Administered 2018-11-11: 2 [IU] via SUBCUTANEOUS
  Filled 2018-11-11: qty 1

## 2018-11-11 MED ORDER — INSULIN ASPART 100 UNIT/ML ~~LOC~~ SOLN: 0.0000 [IU] | Freq: Every day | SUBCUTANEOUS | Status: DC

## 2018-11-11 NOTE — Progress Notes (Signed)
Pt said she needs time to decide who her HPOA would be. Chaplain offered educ   11/11/18 1200  Clinical Encounter Type  Visited With Patient  Visit Type Initial;Spiritual support  Referral From Physician  Consult/Referral To Chaplain  Spiritual Encounters  Spiritual Needs Brochure;Emotional;Other (Comment)  Stress Factors  Patient Stress Factors Exhausted;Loss;Other (Comment)  Advance Directives (For Healthcare)  Does Patient Have a Medical Advance Directive? No  Would patient like information on creating a medical advance directive? Yes (Inpatient - patient defers creating a medical advance directive at this time)  Mental Health Advance Directives  Does Patient Have a Mental Health Advance Directive? No  Would patient like information on creating a mental health advance directive? No - Patient declined  ation on AD.

## 2018-11-11 NOTE — Progress Notes (Signed)
Blood sugar greater than 600.  Order received for regular insulin 25 units

## 2018-11-11 NOTE — Clinical Social Work Note (Signed)
Clinical Social Work Assessment  Patient Details  Name: Tammy [name] MRN: [id number] Date of Birth: 1961/07/03  Date of referral:  11/11/18               Reason for consult:  Discharge Planning                Permission sought to share information with:    Permission granted to share information::     Name::        Agency::     Relationship::     Contact Information:     Housing/Transportation Living arrangements for the past 2 months:  Group Home Source of Information:  Siblings, Facility Patient Interpreter Needed:  None Criminal Activity/Legal Involvement Pertinent to Current Situation/Hospitalization:  No - Comment as needed Significant Relationships:  Siblings Lives with:  Facility Resident Do you feel safe going back to the place where you live?  Yes Need for family participation in patient care:  Yes (Comment)  Care giving concerns:  [facility].    Social Worker assessment / plan:  CSW spoke with the facility owner: Manning CharityHelen [name] this morning and they will take patient back when time for discharge. [name] voiced that they have concerns because patient is non compliant with her oxygen and that they have hired someone at the home to watch over patient. She states that her mental status has been declining. CSW confirmed that patient has dementia and that this could be as a result of a natural progression of her illness. CSW spoke with patient's sister: Tammy [name] [facility]([place]): [phone] via phone and she stated that patient can be difficult and that she is in poor health and cannot visit patient very often. She is in agreement for her sister to [facility] for [facility].   For transport from hospital call Tammy [name] at the group home during the weekdays: [phone] or weekend call Ms. [name]: [phone].  Employment status:    Insurance information:  Medicare PT Recommendations:    Information / Referral  to community resources:     Patient/Family's Response to care:  Patient's sister expressed appreciation for CSW call.  Patient/Family's Understanding of and Emotional Response to Diagnosis, Current Treatment, and Prognosis:  Patient's sister is grateful for the communication and states that it can be somewhat frustrating when her sister doesn't comply at the group home.   Emotional Assessment Appearance:  Appears stated age Attitude/Demeanor/Rapport:    Affect (typically observed):    Orientation:  Oriented to Self, Oriented to Place Alcohol / Substance use:  Not Applicable Psych involvement (Current and /or in the community):  No (Comment)  Discharge Needs  Concerns to be addressed:  Care Coordination Readmission within the last 30 days:  Yes Current discharge risk:  None Barriers to Discharge:  No Barriers Identified   York SpanielMonica Cherine Drumgoole, LCSW 11/11/2018, 11:41 AM

## 2018-11-12 LAB — GLUCOSE, CAPILLARY: Glucose-Capillary: 108 mg/dL — ABNORMAL HIGH (ref 70–99)

## 2018-11-12 MED ORDER — OSELTAMIVIR PHOSPHATE 30 MG PO CAPS: 30.0000 mg | ORAL_CAPSULE | Freq: Two times a day (BID) | ORAL | 0 refills | Status: AC

## 2018-11-12 NOTE — Progress Notes (Signed)
   11/12/18 1100  Clinical Encounter Type  Visited With Patient  Ch followed up an AD. Pt expressed that she needs more time to think. Ch informed her that chaplain service available whenever she is ready.

## 2018-11-12 NOTE — Discharge Summary (Signed)
[facility] - [place] [facility]   PATIENT NAME: Tammy [name]    MR#:  [id number]  DATE OF BIRTH:  05-29-1961  DATE OF ADMISSION:  11/10/2018 ADMITTING PHYSICIAN: Milagros LollSrikar Sudini, MD  DATE OF DISCHARGE: 11/12/2018  PRIMARY CARE PHYSICIAN: Armando GangLindley, Cheryl P, FNP    ADMISSION DIAGNOSIS:  Influenza A [J10.1] COPD exacerbation (HCC) [J44.1]  DISCHARGE DIAGNOSIS:  Influenza A Mild COPD flare--stable Chronic home oxygen use  SECONDARY DIAGNOSIS:   Past Medical History:  Diagnosis Date  . Anxiety   . Asthma   . Bipolar 1 disorder (HCC)   . CHF (congestive heart failure) (HCC)   . COPD (chronic obstructive pulmonary disease) (HCC)   . Dementia (HCC)   . Epilepsy (HCC)   . Seizures [facility](HCC)     HOSPITAL COURSE:   Tammy [name]  is a 58 y.o. female with a known history of bipolar disorder, COPD, resident of local group home, chronic diastolic CHF, hypertension presents to the hospital complaining of 1 day of being ill, cough, shortness of breath and wheezy   *Influenza A with COPD exacerbation -received IV steroids--d/c now, cont Tamiflu - Scheduled Nebulizers - Inhalers -Wean O2 as tolerated -uses chronic home oxygen  * Bipolar Continue lithium  * chronic diastolic CHF Continue home medications  *Hypertension.  Continue Coreg and lisinopril  DVT prophylaxis with Lovenox  Overall improved and at baseline. D/c to group home CONSULTS OBTAINED:    DRUG ALLERGIES:   Allergies  Allergen Reactions  . Penicillins Rash    Has patient had a PCN reaction causing immediate rash, facial/tongue/throat swelling, SOB or lightheadedness with hypotension: No Has patient had a PCN reaction causing severe rash involving mucus membranes or skin necrosis: No Has patient had a PCN reaction that required hospitalization: No Has patient had a PCN reaction occurring within the last 10 years: No If all of the above answers are "NO", then may proceed  with Cephalosporin use.   . Prednisone Rash    DISCHARGE MEDICATIONS:   Allergies as of 11/12/2018      Reactions   Penicillins Rash   Has patient had a PCN reaction causing immediate rash, facial/tongue/throat swelling, SOB or lightheadedness with hypotension: No Has patient had a PCN reaction causing severe rash involving mucus membranes or skin necrosis: No Has patient had a PCN reaction that required hospitalization: No Has patient had a PCN reaction occurring within the last 10 years: No If all of the above answers are "NO", then may proceed with Cephalosporin use.   Prednisone Rash      Medication List    STOP taking these medications   azithromycin 250 MG tablet Commonly known as:  ZITHROMAX   predniSONE 10 MG tablet Commonly known as:  DELTASONE     TAKE these medications   acetaminophen 500 MG tablet Commonly known as:  TYLENOL Take 500-1,000 mg by mouth daily as needed.   albuterol 108 (90 Base) MCG/ACT inhaler Commonly known as:  PROVENTIL HFA;VENTOLIN HFA Inhale 4-6 puffs by mouth every 4 hours as needed for wheezing, cough, and/or shortness of breath   benzonatate 100 MG capsule Commonly known as:  TESSALON Take 100 mg by mouth 3 (three) times daily as needed for cough.   budesonide-formoterol 80-4.5 MCG/ACT inhaler Commonly known as:  SYMBICORT Inhale 2 puffs into the lungs 2 (two) times daily.   busPIRone 30 MG tablet Commonly known as:  BUSPAR Take 30 mg by mouth 2 (two) times daily.   carvedilol  3.125 MG tablet Commonly known as:  COREG Take 3.125 mg by mouth 2 (two) times daily with a meal.   cetirizine 10 MG tablet Commonly known as:  ZYRTEC Take 10 mg by mouth daily.   diphenhydramine-acetaminophen 25-500 MG Tabs tablet Commonly known as:  TYLENOL PM Take 1 tablet by mouth at bedtime as needed.   empagliflozin 10 MG Tabs tablet Commonly known as:  JARDIANCE Take 10 mg by mouth daily.   Fish Oil 1000 MG Caps Take 1 capsule by mouth 2  (two) times daily.   fluticasone 50 MCG/ACT nasal spray Commonly known as:  FLONASE Place 1 spray into both nostrils daily.   guaiFENesin 100 MG/5ML Soln Commonly known as:  ROBITUSSIN Take 5 mLs (100 mg total) by mouth every 4 (four) hours as needed for cough or to loosen phlegm.   levothyroxine 100 MCG tablet Commonly known as:  SYNTHROID, LEVOTHROID Take 100 mcg by mouth daily before breakfast.   lisinopril 2.5 MG tablet Commonly known as:  PRINIVIL,ZESTRIL Take 2.5 mg by mouth daily.   lithium carbonate 150 MG capsule Take by mouth.   metFORMIN 500 MG tablet Commonly known as:  GLUCOPHAGE Take 500 mg by mouth 2 (two) times daily with a meal.   oseltamivir 30 MG capsule Commonly known as:  TAMIFLU Take 1 capsule (30 mg total) by mouth 2 (two) times daily for 3 days.   pravastatin 40 MG tablet Commonly known as:  PRAVACHOL Take 40 mg by mouth daily.   risperiDONE 3 MG tablet Commonly known as:  RISPERDAL Take 3 mg by mouth at bedtime.   rOPINIRole 1 MG tablet Commonly known as:  REQUIP Take 1 mg by mouth at bedtime.   tamsulosin 0.4 MG Caps capsule Commonly known as:  FLOMAX Take 1 capsule (0.4 mg total) by mouth daily.   Vitamin D3 125 MCG (5000 UT) Caps Take 1 capsule by mouth daily.       If you experience worsening of your admission symptoms, develop shortness of breath, life threatening emergency, suicidal or homicidal thoughts you must seek medical attention immediately by calling 911 or calling your MD immediately  if symptoms less severe.  You Must read complete instructions/literature along with all the possible adverse reactions/side effects for all the Medicines you take and that have been prescribed to you. Take any new Medicines after you have completely understood and accept all the possible adverse reactions/side effects.   Please note  You were cared for by a hospitalist during your hospital stay. If you have any questions about your  discharge medications or the care you received while you were in the hospital after you are discharged, you can call the unit and asked to speak with the hospitalist on call if the hospitalist that took care of you is not available. Once you are discharged, your primary care physician will handle any further medical issues. Please note that NO REFILLS for any discharge medications will be authorized once you are discharged, as it is imperative that you return to your primary care physician (or establish a relationship with a primary care physician if you do not have one) for your aftercare needs so that they can reassess your need for medications and monitor your lab values. Today   SUBJECTIVE  Doing well No wheezing   VITAL SIGNS:  Blood pressure 116/67, pulse 79, temperature 98.4 F (36.9 C), temperature source Oral, resp. rate 20, height 5\' 2"  (1.575 m), weight 78.7 kg, SpO2 96 %.  I/O:    Intake/Output Summary (Last 24 hours) at 11/12/2018 0750 Last data filed at 11/12/2018 0602 Gross per 24 hour  Intake -  Output 2300 ml  Net -2300 ml    PHYSICAL EXAMINATION:  GENERAL:  58 y.o.-year-old patient lying in the bed with no acute distress. obeseEYES: Pupils equal, round, reactive to light and accommodation. No scleral icterus. Extraocular muscles intact.  HEENT: Head atraumatic, normocephalic. Oropharynx and nasopharynx clear.  NECK:  Supple, no jugular venous distention. No thyroid enlargement, no tenderness.  LUNGS: Normal breath sounds bilaterally, no wheezing, rales,scatterd rhonchi or no crepitation. No use of accessory muscles of respiration.  CARDIOVASCULAR: S1, S2 normal. No murmurs, rubs, or gallops.  ABDOMEN: Soft, non-tender, non-distended. Bowel sounds present. No organomegaly or mass.  EXTREMITIES: No pedal edema, cyanosis, or clubbing.  NEUROLOGIC: Cranial nerves II through XII are intact. Muscle strength 5/5 in all extremities. Sensation intact. Gait not checked.   PSYCHIATRIC: The patient is alert and oriented x 3.  SKIN: No obvious rash, lesion, or ulcer.   DATA REVIEW:   CBC  Recent Labs  Lab 11/11/18 0420  WBC 7.4  HGB 11.2*  HCT 39.3  PLT 280    Chemistries  Recent Labs  Lab 11/11/18 0420 11/11/18 1113  NA 137  --   K 5.9* 5.3*  CL 110  --   CO2 21*  --   GLUCOSE 212*  --   BUN 17  --   CREATININE 1.25*  --   CALCIUM 9.2  --     Microbiology Results   No results found for this or any previous visit (from the past 240 hour(s)).  RADIOLOGY:  Dg Chest 2 View  Result Date: 11/10/2018 CLINICAL DATA:  Shortness of breath. EXAM: CHEST - 2 VIEW COMPARISON:  Radiograph October 28, 2018. FINDINGS: Stable cardiomegaly. Atherosclerosis of thoracic aorta is noted. No pneumothorax or pleural effusion is noted. No acute pulmonary disease is noted. Bony thorax is unremarkable IMPRESSION: No active cardiopulmonary disease. Aortic Atherosclerosis (ICD10-I70.0). Electronically Signed   By: Lupita Raider, M.D.   On: 11/10/2018 16:01   Ct Chest Wo Contrast  Result Date: 11/10/2018 CLINICAL DATA:  58 year old female with acute shortness of breath, wheezing and cough. EXAM: CT CHEST WITHOUT CONTRAST TECHNIQUE: Multidetector CT imaging of the chest was performed following the standard protocol without IV contrast. COMPARISON:  08/09/2017 FINDINGS: Cardiovascular: Heart size normal. Coronary artery and aortic atherosclerotic calcifications noted without thoracic aortic aneurysm. No pericardial effusion. Mediastinum/Nodes: No enlarged mediastinal or axillary lymph nodes. Thyroid gland, trachea, and esophagus demonstrate no significant findings. Lungs/Pleura: No airspace disease, consolidation, suspicious nodule, mass, pleural effusion or pneumothorax. Mild centrilobular emphysema noted. Upper Abdomen: No acute abnormality. Cholecystectomy and gastric surgical changes again noted. Musculoskeletal: No acute or suspicious bony abnormalities. IMPRESSION: 1.  No evidence of acute abnormality. 2. Coronary artery and Aortic Atherosclerosis (ICD10-I70.0) and Emphysema (ICD10-J43.9). Electronically Signed   By: Harmon Pier M.D.   On: 11/10/2018 16:48     CODE STATUS:     Code Status Orders  (From admission, onward)         Start     Ordered   11/10/18 2024  Full code  Continuous     11/10/18 2024        Code Status History    Date Active Date Inactive Code Status Order ID Comments User Context   10/26/2018 1243 10/30/2018 1703 Full Code [id number]  Ramonita Lab, MD Inpatient   06/23/2017 1944 06/25/2017  1640 Full Code [id number]  Altamese DillingVachhani, Vaibhavkumar, MD Inpatient   03/15/2017 1745 03/17/2017 2024 Full Code [id number]  Altamese DillingVachhani, Vaibhavkumar, MD Inpatient      TOTAL TIME TAKING CARE OF THIS PATIENT: *40* minutes.    Enedina FinnerSona Maddi Collar M.D on 11/12/2018 at 7:50 AM  Between 7am to 6pm - Pager - [phone] After 6pm go to [email or web address] - Social research officer, governmentpassword [email or web address]  [facility]  Office  [phone]  CC: Primary care physician; Armando GangLindley, Cheryl P, FNP

## 2018-11-12 NOTE — Progress Notes (Signed)
11/12/2018 10:29 AM  Spoke with Cari Caraway, listed at pt's guardian.  Notified of impending discharge and he said he would try to be here to pick pt up by noon today.  Bradly Chris, RN

## 2018-11-12 NOTE — Discharge Instructions (Signed)
Use your oxygen, nebs and inhalers as before ?

## 2018-11-12 NOTE — Progress Notes (Signed)
   11/12/18 0900  Clinical Encounter Type  Visited With Patient  Ch attempted a visit to complete an AD. Pt was asleep. Ch will follow up later.

## 2018-11-12 NOTE — NC FL2 (Signed)
[place] MEDICAID FL2 LEVEL OF CARE SCREENING TOOL     IDENTIFICATION  Patient Name: Tammy [name] [facility] Birthdate: 08-Dec-1960 Sex: female Admission Date (Current Location): 11/10/2018  [place] and [place] Number:  Chiropodist and Address:  [facility], [address] [place], [place] [address]      Provider Number: [id number]  Attending Physician Name and Address:  Enedina Finner, MD  Relative Name and Phone Number:       Current Level of Care: Hospital Recommended Level of Care: (group home) Prior Approval Number:    Date Approved/Denied:   PASRR Number:    Discharge Plan: (group home)    Current Diagnoses: Patient Active Problem List   Diagnosis Date Noted  . Influenza A 11/10/2018  . Hyponatremia 10/26/2018  . Syncopal episodes 06/23/2017  . Acute on chronic respiratory failure with hypoxia (HCC) 03/15/2017  . COPD with acute exacerbation (HCC) 03/15/2017  . Acute on chronic respiratory failure (HCC) 03/15/2017    Orientation RESPIRATION BLADDER Height & Weight     Self, Place  Normal Incontinent Weight: 173 lb 8 oz (78.7 kg) Height:  5\' 2"  (157.5 cm)  BEHAVIORAL SYMPTOMS/MOOD NEUROLOGICAL BOWEL NUTRITION STATUS  (none)   Incontinent Diet(regular)  AMBULATORY STATUS COMMUNICATION OF NEEDS Skin   Supervision Verbally Normal                       Personal Care Assistance Level of Assistance  Bathing, Dressing Bathing Assistance: Limited assistance   Dressing Assistance: Limited assistance     Functional Limitations Info             SPECIAL CARE FACTORS FREQUENCY                       Contractures Contractures Info: Not present    Additional Factors Info  Code Status Code Status Info: full             Medication List    STOP taking these medications   azithromycin 250 MG tablet Commonly known as:  ZITHROMAX   predniSONE 10 MG tablet Commonly known as:  DELTASONE     TAKE these  medications   acetaminophen 500 MG tablet Commonly known as:  TYLENOL Take 500-1,000 mg by mouth daily as needed.   albuterol 108 (90 Base) MCG/ACT inhaler Commonly known as:  PROVENTIL HFA;VENTOLIN HFA Inhale 4-6 puffs by mouth every 4 hours as needed for wheezing, cough, and/or shortness of breath   benzonatate 100 MG capsule Commonly known as:  TESSALON Take 100 mg by mouth 3 (three) times daily as needed for cough.   budesonide-formoterol 80-4.5 MCG/ACT inhaler Commonly known as:  SYMBICORT Inhale 2 puffs into the lungs 2 (two) times daily.   busPIRone 30 MG tablet Commonly known as:  BUSPAR Take 30 mg by mouth 2 (two) times daily.   carvedilol 3.125 MG tablet Commonly known as:  COREG Take 3.125 mg by mouth 2 (two) times daily with a meal.   cetirizine 10 MG tablet Commonly known as:  ZYRTEC Take 10 mg by mouth daily.   diphenhydramine-acetaminophen 25-500 MG Tabs tablet Commonly known as:  TYLENOL PM Take 1 tablet by mouth at bedtime as needed.   empagliflozin 10 MG Tabs tablet Commonly known as:  JARDIANCE Take 10 mg by mouth daily.   Fish Oil 1000 MG Caps Take 1 capsule by mouth 2 (two) times daily.   fluticasone 50 MCG/ACT nasal spray Commonly  known as:  FLONASE Place 1 spray into both nostrils daily.   guaiFENesin 100 MG/5ML Soln Commonly known as:  ROBITUSSIN Take 5 mLs (100 mg total) by mouth every 4 (four) hours as needed for cough or to loosen phlegm.   levothyroxine 100 MCG tablet Commonly known as:  SYNTHROID, LEVOTHROID Take 100 mcg by mouth daily before breakfast.   lisinopril 2.5 MG tablet Commonly known as:  PRINIVIL,ZESTRIL Take 2.5 mg by mouth daily.   lithium carbonate 150 MG capsule Take by mouth.   metFORMIN 500 MG tablet Commonly known as:  GLUCOPHAGE Take 500 mg by mouth 2 (two) times daily with a meal.   oseltamivir 30 MG capsule Commonly known as:  TAMIFLU Take 1 capsule (30 mg total) by mouth 2 (two) times  daily for 3 days.   pravastatin 40 MG tablet Commonly known as:  PRAVACHOL Take 40 mg by mouth daily.   risperiDONE 3 MG tablet Commonly known as:  RISPERDAL Take 3 mg by mouth at bedtime.   rOPINIRole 1 MG tablet Commonly known as:  REQUIP Take 1 mg by mouth at bedtime.   tamsulosin 0.4 MG Caps capsule Commonly known as:  FLOMAX Take 1 capsule (0.4 mg total) by mouth daily.   Vitamin D3 125 MCG (5000 UT) Caps Take 1 capsule by mouth daily.        Additional Information    [name], LCSW

## 2018-11-12 NOTE — Clinical Social Work Note (Signed)
[facility] is [facility]. Dennis with the group home will transport her. CSW spoke with patient's sister and she is in agreement with the discharge. [name] MSW,LCSW [phone]

## 2018-11-12 NOTE — Progress Notes (Signed)
[facility] - [place] at [facility]   PATIENT NAME: Tammy [name]    MR#:  [id number]  DATE OF BIRTH:  1961/04/11  SUBJECTIVE:   Came in with cough, sob and low grade fever REVIEW OF SYSTEMS:   Review of Systems  Constitutional: Negative for chills, fever and weight loss.  HENT: Negative for ear discharge, ear pain and nosebleeds.   Eyes: Negative for blurred vision, pain and discharge.  Respiratory: Positive for cough and shortness of breath. Negative for sputum production, wheezing and stridor.   Cardiovascular: Negative for chest pain, palpitations, orthopnea and PND.  Gastrointestinal: Negative for abdominal pain, diarrhea, nausea and vomiting.  Genitourinary: Negative for frequency and urgency.  Musculoskeletal: Negative for back pain and joint pain.  Neurological: Positive for weakness. Negative for sensory change, speech change and focal weakness.  Psychiatric/Behavioral: Negative for depression and hallucinations. The patient is not nervous/anxious.    Tolerating Diet:yes Tolerating PT: ambulatory  DRUG ALLERGIES:   Allergies  Allergen Reactions  . Penicillins Rash    Has patient had a PCN reaction causing immediate rash, facial/tongue/throat swelling, SOB or lightheadedness with hypotension: No Has patient had a PCN reaction causing severe rash involving mucus membranes or skin necrosis: No Has patient had a PCN reaction that required hospitalization: No Has patient had a PCN reaction occurring within the last 10 years: No If all of the above answers are "NO", then may proceed with Cephalosporin use.   . Prednisone Rash    VITALS:  Blood pressure 116/67, pulse 79, temperature 98.4 F (36.9 C), temperature source Oral, resp. rate 20, height 5\' 2"  (1.575 m), weight 78.7 kg, SpO2 96 %.  PHYSICAL EXAMINATION:   Physical Exam  GENERAL:  58 y.o.-year-old patient lying in the bed with no acute distress.  EYES: Pupils equal, round,  reactive to light and accommodation. No scleral icterus. Extraocular muscles intact.  HEENT: Head atraumatic, normocephalic. Oropharynx and nasopharynx clear.  NECK:  Supple, no jugular venous distention. No thyroid enlargement, no tenderness.  LUNGS: Normal breath sounds bilaterally, scattered wheezing, no rales, rhonchi. No use of accessory muscles of respiration.  CARDIOVASCULAR: S1, S2 normal. No murmurs, rubs, or gallops.  ABDOMEN: Soft, nontender, nondistended. Bowel sounds present. No organomegaly or mass.  EXTREMITIES: No cyanosis, clubbing or edema b/l.    NEUROLOGIC: Cranial nerves II through XII are intact. No focal Motor or sensory deficits b/l.   PSYCHIATRIC:  patient is alert and oriented x 3.  SKIN: No obvious rash, lesion, or ulcer.   LABORATORY PANEL:  CBC Recent Labs  Lab 11/11/18 0420  WBC 7.4  HGB 11.2*  HCT 39.3  PLT 280    Chemistries  Recent Labs  Lab 11/11/18 0420 11/11/18 1113  NA 137  --   K 5.9* 5.3*  CL 110  --   CO2 21*  --   GLUCOSE 212*  --   BUN 17  --   CREATININE 1.25*  --   CALCIUM 9.2  --    Cardiac Enzymes No results for input(s): TROPONINI in the last 168 hours. RADIOLOGY:  Dg Chest 2 View  Result Date: 11/10/2018 CLINICAL DATA:  Shortness of breath. EXAM: CHEST - 2 VIEW COMPARISON:  Radiograph October 28, 2018. FINDINGS: Stable cardiomegaly. Atherosclerosis of thoracic aorta is noted. No pneumothorax or pleural effusion is noted. No acute pulmonary disease is noted. Bony thorax is unremarkable IMPRESSION: No active cardiopulmonary disease. Aortic Atherosclerosis (ICD10-I70.0). Electronically Signed   By: Lupita Raider, M.D.  On: 11/10/2018 16:01   Ct Chest Wo Contrast  Result Date: 11/10/2018 CLINICAL DATA:  58 year old female with acute shortness of breath, wheezing and cough. EXAM: CT CHEST WITHOUT CONTRAST TECHNIQUE: Multidetector CT imaging of the chest was performed following the standard protocol without IV contrast.  COMPARISON:  08/09/2017 FINDINGS: Cardiovascular: Heart size normal. Coronary artery and aortic atherosclerotic calcifications noted without thoracic aortic aneurysm. No pericardial effusion. Mediastinum/Nodes: No enlarged mediastinal or axillary lymph nodes. Thyroid gland, trachea, and esophagus demonstrate no significant findings. Lungs/Pleura: No airspace disease, consolidation, suspicious nodule, mass, pleural effusion or pneumothorax. Mild centrilobular emphysema noted. Upper Abdomen: No acute abnormality. Cholecystectomy and gastric surgical changes again noted. Musculoskeletal: No acute or suspicious bony abnormalities. IMPRESSION: 1. No evidence of acute abnormality. 2. Coronary artery and Aortic Atherosclerosis (ICD10-I70.0) and Emphysema (ICD10-J43.9). Electronically Signed   By: Harmon Pier M.D.   On: 11/10/2018 16:48   ASSESSMENT AND PLAN:  BrendaHaithcockis a57 y.o.femalewith a known history of bipolar disorder, COPD, resident of local group home,chronic diastolic CHF, hypertension presents to the hospital complaining of 1 day of being ill, cough, shortness of breath and wheezy  *InfluenzaAwith COPD exacerbation -received IV steroids--d/c now, contTamiflu - Scheduled Nebulizers - Inhalers -Wean O2 as tolerated -uses chronic home oxygen  * Bipolar Continue lithium  * chronicdiastolic CHF Continue home medications  *Hypertension. Continue Coreg and lisinopril  DVT prophylaxis with Lovenox  Case discussed with Care Management/Social Worker. Management plans discussed with the patient, family and they are in agreement.  CODE STATUS: full   TOTAL TIME TAKING CARE OF THIS PATIENT: .  >50% time spent on counselling and coordination of care  POSSIBLE D/C IN 1-2 DAYS, DEPENDING ON CLINICAL CONDITION.  Note: This dictation was prepared with Dragon dictation along with smaller phrase technology. Any transcriptional errors that result from this process are  unintentional.  Enedina Finner M.D on 11/12/2018 at 7:56 AM  Between 7am to 6pm - Pager - [phone]  After 6pm go to [email or web address] - Social research officer, government  [facility]  Office  [phone]  CC: Primary care physician; Armando Gang, FNPPatient ID: Tammy [name], female   DOB: 1961-02-14, 58 y.o.   MRN: [id number]

## 2018-12-10 ENCOUNTER — Encounter: Payer: Self-pay | Admitting: Podiatry

## 2018-12-10 ENCOUNTER — Ambulatory Visit (INDEPENDENT_AMBULATORY_CARE_PROVIDER_SITE_OTHER): Payer: Medicare Other | Admitting: Podiatry

## 2018-12-10 DIAGNOSIS — B351 Tinea unguium: Secondary | ICD-10-CM

## 2018-12-10 DIAGNOSIS — M79676 Pain in unspecified toe(s): Secondary | ICD-10-CM | POA: Diagnosis not present

## 2018-12-10 DIAGNOSIS — E119 Type 2 diabetes mellitus without complications: Secondary | ICD-10-CM

## 2018-12-10 DIAGNOSIS — M79609 Pain in unspecified limb: Principal | ICD-10-CM

## 2018-12-10 NOTE — Progress Notes (Signed)
Complaint:  Visit Type: Patient returns to my office for continued preventative foot care services. Complaint: Patient states" my nails have grown long and thick and become painful to walk and wear shoes" Patient has been diagnosed with DM and now takes metformin.. The patient presents for preventative foot care services. No changes to ROS  Podiatric Exam: Vascular: dorsalis pedis and posterior tibial pulses are palpable bilateral. Capillary return is immediate. Temperature gradient is WNL. Skin turgor WNL  Sensorium: Normal Semmes Weinstein monofilament test. Normal tactile sensation bilaterally. Nail Exam: Pt has thick disfigured discolored nails with subungual debris noted bilateral entire nail hallux through fifth toenails Ulcer Exam: There is no evidence of ulcer or pre-ulcerative changes or infection. Orthopedic Exam: Muscle tone and strength are WNL. No limitations in general ROM. No crepitus or effusions noted. Foot type and digits show no abnormalities. HAV  B/L. Dorsal  DJD  B/L Skin: No Porokeratosis. No infection or ulcers  Diagnosis:  Onychomycosis, , Pain in right toe, pain in left toes  Treatment & Plan Procedures and Treatment: Consent by patient was obtained for treatment procedures.   Debridement of mycotic and hypertrophic toenails, 1 through 5 bilateral and clearing of subungual debris. No ulceration, no infection noted.  Return Visit-Office Procedure: Patient instructed to return to the office for a follow up visit 4 months for continued evaluation and treatment.    Helane Gunther DPM

## 2018-12-15 ENCOUNTER — Emergency Department
Admission: EM | Admit: 2018-12-15 | Discharge: 2018-12-16 | Disposition: A | Discharge status: Home / Self Care | Payer: Medicare Other | Attending: Student in an Organized Health Care Education/Training Program | Admitting: Student in an Organized Health Care Education/Training Program

## 2018-12-15 ENCOUNTER — Encounter: Payer: Self-pay | Admitting: Emergency Medicine

## 2018-12-15 ENCOUNTER — Emergency Department: Payer: Medicare Other

## 2018-12-15 ENCOUNTER — Other Ambulatory Visit: Payer: Self-pay

## 2018-12-15 DIAGNOSIS — R569 Unspecified convulsions: Secondary | ICD-10-CM | POA: Diagnosis present

## 2018-12-15 DIAGNOSIS — J45909 Unspecified asthma, uncomplicated: Secondary | ICD-10-CM | POA: Insufficient documentation

## 2018-12-15 DIAGNOSIS — N3001 Acute cystitis with hematuria: Secondary | ICD-10-CM | POA: Diagnosis not present

## 2018-12-15 DIAGNOSIS — Z79899 Other long term (current) drug therapy: Secondary | ICD-10-CM | POA: Insufficient documentation

## 2018-12-15 DIAGNOSIS — Z7984 Long term (current) use of oral hypoglycemic drugs: Secondary | ICD-10-CM | POA: Diagnosis not present

## 2018-12-15 DIAGNOSIS — F0390 Unspecified dementia without behavioral disturbance: Secondary | ICD-10-CM | POA: Diagnosis not present

## 2018-12-15 DIAGNOSIS — J449 Chronic obstructive pulmonary disease, unspecified: Secondary | ICD-10-CM | POA: Diagnosis not present

## 2018-12-15 DIAGNOSIS — Z87891 Personal history of nicotine dependence: Secondary | ICD-10-CM | POA: Diagnosis not present

## 2018-12-15 LAB — MAGNESIUM: Magnesium: 1.9 mg/dL (ref 1.7–2.4)

## 2018-12-15 LAB — URINALYSIS, COMPLETE (UACMP) WITH MICROSCOPIC
Bilirubin Urine: NEGATIVE
Ketones, ur: NEGATIVE mg/dL
NITRITE: NEGATIVE
Protein, ur: NEGATIVE mg/dL
Specific Gravity, Urine: 1 — ABNORMAL LOW (ref 1.005–1.030)
pH: 6 (ref 5.0–8.0)

## 2018-12-15 LAB — CBC WITH DIFFERENTIAL/PLATELET
Abs Immature Granulocytes: 0.04 10*3/uL (ref 0.00–0.07)
Basophils Absolute: 0 10*3/uL (ref 0.0–0.1)
Basophils Relative: 1 %
Eosinophils Absolute: 0.2 10*3/uL (ref 0.0–0.5)
Eosinophils Relative: 2 %
HCT: 37.6 % (ref 36.0–46.0)
Hemoglobin: 10.8 g/dL — ABNORMAL LOW (ref 12.0–15.0)
Immature Granulocytes: 1 %
Lymphocytes Relative: 24 %
Lymphs Abs: 1.8 10*3/uL (ref 0.7–4.0)
MCH: 24.1 pg — ABNORMAL LOW (ref 26.0–34.0)
MCHC: 28.7 g/dL — ABNORMAL LOW (ref 30.0–36.0)
MCV: 83.7 fL (ref 80.0–100.0)
Monocytes Absolute: 0.6 10*3/uL (ref 0.1–1.0)
Monocytes Relative: 9 %
NEUTROS PCT: 63 %
Neutro Abs: 4.8 10*3/uL (ref 1.7–7.7)
Platelets: 188 10*3/uL (ref 150–400)
RBC: 4.49 MIL/uL (ref 3.87–5.11)
RDW: 17.2 % — ABNORMAL HIGH (ref 11.5–15.5)
WBC: 7.4 10*3/uL (ref 4.0–10.5)
nRBC: 0 % (ref 0.0–0.2)

## 2018-12-15 LAB — BASIC METABOLIC PANEL
Anion gap: 12 (ref 5–15)
BUN: 19 mg/dL (ref 6–20)
CO2: 21 mmol/L — ABNORMAL LOW (ref 22–32)
Calcium: 9 mg/dL (ref 8.9–10.3)
Chloride: 100 mmol/L (ref 98–111)
Creatinine, Ser: 1.07 mg/dL — ABNORMAL HIGH (ref 0.44–1.00)
GFR calc non Af Amer: 58 mL/min — ABNORMAL LOW (ref >60)
Glucose, Bld: 127 mg/dL — ABNORMAL HIGH (ref 70–99)
Potassium: 4.8 mmol/L (ref 3.5–5.1)
Sodium: 133 mmol/L — ABNORMAL LOW (ref 135–145)

## 2018-12-15 LAB — LACTIC ACID, PLASMA
Lactic Acid, Venous: 2.1 mmol/L (ref 0.5–1.9)
Lactic Acid, Venous: 1.5 mmol/L (ref 0.5–1.9)

## 2018-12-15 MED ORDER — CEPHALEXIN 500 MG PO CAPS: 500.0000 mg | ORAL_CAPSULE | Freq: Three times a day (TID) | ORAL | 0 refills | Status: AC

## 2018-12-15 MED ORDER — ACETAMINOPHEN 500 MG PO TABS
1000.0000 mg | ORAL_TABLET | Freq: Once | ORAL | Status: AC
  Administered 2018-12-15: 1000 mg via ORAL
  Filled 2018-12-15: qty 2

## 2018-12-15 MED ORDER — SODIUM CHLORIDE 0.9 % IV SOLN
1.0000 g | Freq: Once | INTRAVENOUS | Status: AC
  Administered 2018-12-15: 1 g via INTRAVENOUS
  Filled 2018-12-15: qty 10

## 2018-12-15 MED ORDER — IPRATROPIUM-ALBUTEROL 0.5-2.5 (3) MG/3ML IN SOLN
3.0000 mL | Freq: Once | RESPIRATORY_TRACT | Status: AC
  Administered 2018-12-15: 3 mL via RESPIRATORY_TRACT

## 2018-12-15 MED ORDER — IPRATROPIUM-ALBUTEROL 0.5-2.5 (3) MG/3ML IN SOLN
3.0000 mL | Freq: Once | RESPIRATORY_TRACT | Status: AC
  Administered 2018-12-15: 3 mL via RESPIRATORY_TRACT
  Filled 2018-12-15: qty 6

## 2018-12-15 MED ORDER — SODIUM CHLORIDE 0.9 % IV BOLUS
1000.0000 mL | Freq: Once | INTRAVENOUS | Status: AC
  Administered 2018-12-15: 1000 mL via INTRAVENOUS

## 2018-12-15 NOTE — ED Notes (Signed)
Pt is prepared and ready for discharge at this time.

## 2018-12-15 NOTE — ED Notes (Addendum)
Pt assisted to toilet in ED RM.  Once pt was back in bed pt O2 levels dropped down to the mid 70's and was placed on 3L of O2. Pt level went back up to 100% and is now resting in bed comfortably with no complaints.Marland Kitchen

## 2018-12-15 NOTE — ED Notes (Signed)
Pt is resting in bed eating a meal tray at this time.

## 2018-12-15 NOTE — ED Notes (Signed)
Patient given sandwich tray. Sitting up in bed eating.

## 2018-12-15 NOTE — ED Notes (Signed)
Pt able to ambulate to bathroom with a steady gait. Meal tray given to pt.

## 2018-12-15 NOTE — ED Notes (Signed)
Patient transported to X-ray 

## 2018-12-15 NOTE — ED Notes (Signed)
Pt reporting increased WOB, increased exp wheeze noted with grunting.

## 2018-12-15 NOTE — ED Provider Notes (Signed)
[facility] Emergency Department Provider Note  ____________________________________________  Time seen: Approximately 12:56 PM  I have reviewed the triage vital signs and the nursing notes.   HISTORY  Chief Complaint Seizures   HPI Tammy [name] is a 58 y.o. female with a documented history of epilepsy however not on any antiseizure medications, bipolar, COPD, dementia who presents for evaluation after having a seizure.  Patient lives in a group home.  She was in her day program and had a witnessed generalized tonic-clonic seizure.  She was walking down the hall and and collapsed during this episode.  When EMS arrived patient was back to her baseline.  Patient says that she has seizures once or twice a year.  She does not know if she takes any medications for it.  She is complaining of mild left-sided occipital headache.  She reports having a headache every time she has a seizure.  She is not on blood thinners.  She denies neck pain or back pain, denies any extremity pain.  She has not been sick recently, no vomiting or diarrhea, no fever or chills, no respiratory symptoms.  Patient has been admitted twice here for seizure-like activity in the setting of urinary tract infection once and in the setting of hyponatremia.   Past Medical History:  Diagnosis Date  . Anxiety   . Asthma   . Bipolar 1 disorder (HCC)   . CHF (congestive heart failure) (HCC)   . COPD (chronic obstructive pulmonary disease) (HCC)   . Dementia (HCC)   . Epilepsy (HCC)   . Seizures [facility])     Patient Active Problem List   Diagnosis Date Noted  . Influenza A 11/10/2018  . Hyponatremia 10/26/2018  . Syncopal episodes 06/23/2017  . Acute on chronic respiratory failure with hypoxia (HCC) 03/15/2017  . COPD with acute exacerbation (HCC) 03/15/2017  . Acute on chronic respiratory failure (HCC) 03/15/2017    Past Surgical History:  Procedure Laterality Date  . APPENDECTOMY    .  hernia reapir     x 3      Prior to Admission medications   Medication Sig Start Date End Date Taking? Authorizing Provider  acetaminophen (TYLENOL) 500 MG tablet Take 500-1,000 mg by mouth daily as needed.     [provider]  albuterol (PROVENTIL HFA;VENTOLIN HFA) 108 (90 BASE) MCG/ACT inhaler Inhale 4-6 puffs by mouth every 4 hours as needed for wheezing, cough, and/or shortness of breath 07/01/15   Loleta Rose, MD  benzonatate (TESSALON) 100 MG capsule Take 100 mg by mouth 3 (three) times daily as needed for cough.    [provider]  budesonide-formoterol (SYMBICORT) 80-4.5 MCG/ACT inhaler Inhale 2 puffs into the lungs 2 (two) times daily.    [provider]  busPIRone (BUSPAR) 30 MG tablet Take 30 mg by mouth 2 (two) times daily.     [provider]  carvedilol (COREG) 3.125 MG tablet Take 3.125 mg by mouth 2 (two) times daily with a meal.     [provider]  cephALEXin (KEFLEX) 500 MG capsule Take 1 capsule (500 mg total) by mouth 3 (three) times daily for 7 days. 12/15/18 12/22/18  Nita Sickle, MD  cetirizine (ZYRTEC) 10 MG tablet Take 10 mg by mouth daily.    [provider]  Cholecalciferol (VITAMIN D3) 5000 units CAPS Take 1 capsule by mouth daily.    [provider]  diphenhydramine-acetaminophen (TYLENOL PM) 25-500 MG TABS tablet Take 1 tablet by mouth  at bedtime as needed.    [provider]  empagliflozin (JARDIANCE) 10 MG TABS tablet Take 10 mg by mouth daily.     [provider]  fluticasone (FLONASE) 50 MCG/ACT nasal spray Place 1 spray into both nostrils daily.     [provider]  guaiFENesin (ROBITUSSIN) 100 MG/5ML SOLN Take 5 mLs (100 mg total) by mouth every 4 (four) hours as needed for cough or to loosen phlegm. 08/09/17   Sharman Cheek, MD  levothyroxine (SYNTHROID, LEVOTHROID) 100 MCG tablet Take 100 mcg by mouth daily before breakfast.     [provider]    lisinopril (PRINIVIL,ZESTRIL) 2.5 MG tablet Take 2.5 mg by mouth daily.    [provider]  lithium carbonate 150 MG capsule Take by mouth.    [provider]  metFORMIN (GLUCOPHAGE) 500 MG tablet Take 500 mg by mouth 2 (two) times daily with a meal.    [provider]  Omega-3 Fatty Acids (FISH OIL) 1000 MG CAPS Take 1 capsule by mouth 2 (two) times daily.    [provider]  pravastatin (PRAVACHOL) 40 MG tablet Take 40 mg by mouth daily.    [provider]  risperiDONE (RISPERDAL) 3 MG tablet Take 3 mg by mouth at bedtime.     [provider]  rOPINIRole (REQUIP) 1 MG tablet Take 1 mg by mouth at bedtime.    [provider]  tamsulosin (FLOMAX) 0.4 MG CAPS capsule Take 1 capsule (0.4 mg total) by mouth daily. 03/25/17   Harle Battiest, PA-C  TRELEGY ELLIPTA 100-62.5-25 MCG/INH AEPB  11/17/18   [provider]    Allergies Penicillins and Prednisone  Family History  Problem Relation Age of Onset  . Hypertension Mother   . Kidney cancer Neg Hx   . Bladder Cancer Neg Hx     Social History Social History   Tobacco Use  . Smoking status: Former Smoker    Last attempt to quit: 10/07/1998    Years since quitting: 20.2  . Smokeless tobacco: Never Used  Substance Use Topics  . Alcohol use: No  . Drug use: No    Review of Systems  Constitutional: Negative for fever. Eyes: Negative for visual changes. ENT: Negative for sore throat. Neck: No neck pain  Cardiovascular: Negative for chest pain. Respiratory: Negative for shortness of breath. Gastrointestinal: Negative for abdominal pain, vomiting or diarrhea. Genitourinary: Negative for dysuria. Musculoskeletal: Negative for back pain. Skin: Negative for rash. Neurological: Negative for headaches, weakness or numbness. + seizure Psych: No SI or HI  ____________________________________________   PHYSICAL EXAM:  VITAL SIGNS: ED Triage Vitals  Enc Vitals  Group     BP 12/15/18 1247 (!) 144/64     Pulse Rate 12/15/18 1247 72     Resp 12/15/18 1247 18     Temp 12/15/18 1247 97.8 F (36.6 C)     Temp Source 12/15/18 1247 Oral     SpO2 12/15/18 1247 98 %     Weight 12/15/18 1248 180 lb (81.6 kg)     Height 12/15/18 1248  (1.575 m)     Head Circumference --      Peak Flow --      Pain Score 12/15/18 1248 6     Pain Loc --      Pain Edu? --      Excl. in GC? --     Constitutional: Alert and oriented x 3. Well appearing and in no apparent distress.  HEENT:      Head: Normocephalic and atraumatic.         Eyes: Conjunctivae are normal. Sclera is non-icteric.       Mouth/Throat: Mucous membranes are moist.       Neck: Supple with no signs of meningismus.  No C-spine tenderness Cardiovascular: Regular rate and rhythm. No murmurs, gallops, or rubs. 2+ symmetrical distal pulses are present in all extremities. No JVD. Respiratory: Normal respiratory effort. Lungs are clear to auscultation bilaterally. No wheezes, crackles, or rhonchi.  Gastrointestinal: Soft, non tender, and non distended with positive bowel sounds. No rebound or guarding. Musculoskeletal: Nontender with normal range of motion in all extremities. No edema, cyanosis, or erythema of extremities.  No T and L-spine tenderness Neurologic: Normal speech and language. Face is symmetric. Moving all extremities. No gross focal neurologic deficits are appreciated. Skin: Skin is warm, dry and intact. No rash noted. Psychiatric: Mood and affect are normal. Speech and behavior are normal.  ____________________________________________   LABS (all labs ordered are listed, but only abnormal results are displayed)  Labs Reviewed  CBC WITH DIFFERENTIAL/PLATELET - Abnormal; Notable for the following components:      Result Value   Hemoglobin 10.8 (*)    MCH 24.1 (*)    MCHC 28.7 (*)    RDW 17.2 (*)    All other components within normal limits  BASIC METABOLIC PANEL - Abnormal;  Notable for the following components:   Sodium 133 (*)    CO2 21 (*)    Glucose, Bld 127 (*)    Creatinine, Ser 1.07 (*)    GFR calc non Af Amer 58 (*)    All other components within normal limits  URINALYSIS, COMPLETE (UACMP) WITH MICROSCOPIC - Abnormal; Notable for the following components:   Color, Urine STRAW (*)    APPearance HAZY (*)    Specific Gravity, Urine 1.000 (*)    Glucose, UA >=500 (*)    Hgb urine dipstick SMALL (*)    Leukocytes,Ua LARGE (*)    Bacteria, UA RARE (*)    All other components within normal limits  LACTIC ACID, PLASMA - Abnormal; Notable for the following components:   Lactic Acid, Venous 2.1 (*)    All other components within normal limits  URINE CULTURE  MAGNESIUM   ____________________________________________  EKG  ED ECG REPORT I, Nita Sickle, the attending physician, personally viewed and interpreted this ECG.  Normal sinus rhythm, rate of 74, first-degree AV block, right bundle branch block, prolonged QTC, normal axis, no ST elevations or depressions.  Prolonged intervals are new when compared to prior. ____________________________________________  RADIOLOGY  I have personally reviewed the images performed during this visit and I agree with the Radiologist's read.   Interpretation by Radiologist:  Ct Head Wo Contrast  Result Date: 12/15/2018 CLINICAL DATA:  Seizure today.  History of seizures. EXAM: CT HEAD WITHOUT CONTRAST TECHNIQUE: Contiguous axial images were obtained from the base of the skull through the vertex without intravenous contrast. COMPARISON:  Head CT scan 12/26/2017 and 06/24/2017. FINDINGS: Brain: No evidence of acute infarction, hemorrhage, hydrocephalus, extra-axial collection or mass lesion/mass effect. Vascular: No hyperdense vessel or unexpected calcification. Skull: No focal lesion. Sinuses/Orbits: Negative. Other: None. IMPRESSION: Negative head CT. Electronically Signed   By: Drusilla Kanner M.D.   On:  12/15/2018 13:57     ____________________________________________   PROCEDURES  Procedure(s) performed: None Procedures Critical Care performed:  None ____________________________________________   INITIAL IMPRESSION / ASSESSMENT AND PLAN / ED  COURSE  58 y.o. female with a documented history of epilepsy however not on any antiseizure medications, bipolar, COPD, dementia who presents for evaluation after having a seizure.  Patient has documented history of epilepsy in her chart however review of epic and care everywhere does not show that patient has been on any antiseizure medications over the last several years.  She has been admitted twice over the last year with seizure-like activity.  The first time she had a UTI and the second time she had hyponatremia.  Patient is not sure if she is supposed to be on seizure medications.  She is currently at baseline and has a history of dementia.  Likely she does not sustain any trauma from her seizure.  She did fall and hit her head on the ground however head CT is negative for acute traumatic injury.  Her UA is positive for urinary tract infection.  She has normal vital signs and normal white count with no evidence of sepsis.  Her lactic acid is borderline at 2.1 which could be due to dehydration or even due to a seizure if she did have 1.  I will give IV fluids and repeat the lactic.  We will also give her Rocephin for the UTI.  Will send urine for culture.  Will continue to monitor patient closely.  Clinical Course as of Dec 14 1456  Tue Dec 15, 2018  1456 I spoke with the manager of patient's group home.  He reports the patient has been living there for at least 2 years and she has not been on any antiepileptic medication. Patient remains well appearing and at baseline.    [CV]    Clinical Course User Index [CV] Don Perking, [place], MD   _________________________ 2:58 PM on 12/15/2018 -----------------------------------------  Repeat lactic  acid is pending.  Care transferred to Dr. Roxan Hockey.  Discussed with patient and the manager of the group home the patient needs to see her primary care doctor for possible outpatient EEG to determine if patient has seizure disorder or not.  Discussed seizure return precautions with patient  As part of my medical decision making, I reviewed the following data within the electronic MEDICAL RECORD NUMBER Nursing notes reviewed and incorporated, Labs reviewed , EKG interpreted , Old EKG reviewed, Old chart reviewed, Radiograph reviewed , Notes from prior ED visits and [place] Controlled Substance Database    Pertinent labs & imaging results that were available during my care of the patient were reviewed by me and considered in my medical decision making (see chart for details).    ____________________________________________   FINAL CLINICAL IMPRESSION(S) / ED DIAGNOSES  Final diagnoses:  Seizure-like activity (HCC)  Acute cystitis with hematuria      NEW MEDICATIONS STARTED DURING THIS VISIT:  ED Discharge Orders         Ordered    cephALEXin (KEFLEX) 500 MG capsule  3 times daily     12/15/18 1457           Note:  This document was prepared using Dragon voice recognition software and may include unintentional dictation errors.    Don Perking, [place], MD 12/15/18 [phone]

## 2018-12-15 NOTE — ED Triage Notes (Signed)
Patient was at [facility] when she had witnessed seizure. Patient with history of seizure. Patient is unsure of what she takes for seizure or if she has been taking them as prescribed. Patient states she has headache and is dizzy. Patient alert and oriented but slow to respond to questions. MD at bedside.

## 2018-12-15 NOTE — ED Notes (Signed)
Seizure/ safety precautions in place. Seizures pads placed.

## 2018-12-15 NOTE — ED Notes (Signed)
Pt returned from xray at this time. Pt is NAD 98% [place] 2 L, pt chronically uses O2 on 2L for COPD.

## 2018-12-15 NOTE — ED Notes (Signed)
Dennis from home was at bedside requesting update. Per pt permission update given at this time.

## 2018-12-15 NOTE — ED Notes (Signed)
This RN called Tammy [name] the case Production designer, theatre/television/film for transport arrangement. Was unable to reach at this time.   Tammy [name] @ [phone]

## 2018-12-15 NOTE — ED Notes (Addendum)
Hazel on the way to pick pt up

## 2018-12-15 NOTE — ED Provider Notes (Signed)
Patient received in signout.  Does have evidence of UTI.  She is not had any seizure-like activity after observation here in the ER.  She is satting well on her chronic O2.  No evidence of pneumonia.  Repeat lactate is normal.  She is tolerating oral hydration and stable and appropriate for discharge home.   Willy Eddy, MD 12/15/18 [phone]

## 2018-12-17 LAB — URINE CULTURE: Culture: >=100000 — AB

## 2019-03-11 ENCOUNTER — Other Ambulatory Visit: Payer: Self-pay

## 2019-03-11 ENCOUNTER — Encounter: Payer: Self-pay | Admitting: Podiatry

## 2019-03-11 ENCOUNTER — Ambulatory Visit (INDEPENDENT_AMBULATORY_CARE_PROVIDER_SITE_OTHER): Payer: Medicare Other | Admitting: Podiatry

## 2019-03-11 DIAGNOSIS — M79676 Pain in unspecified toe(s): Secondary | ICD-10-CM

## 2019-03-11 DIAGNOSIS — M79609 Pain in unspecified limb: Secondary | ICD-10-CM

## 2019-03-11 DIAGNOSIS — B351 Tinea unguium: Secondary | ICD-10-CM | POA: Diagnosis not present

## 2019-03-11 DIAGNOSIS — E119 Type 2 diabetes mellitus without complications: Secondary | ICD-10-CM

## 2019-03-11 NOTE — Progress Notes (Signed)
Complaint:  Visit Type: Patient returns to my office for continued preventative foot care services. Complaint: Patient states" my nails have grown long and thick and become painful to walk and wear shoes" Patient has been diagnosed with DM and now takes metformin.. The patient presents for preventative foot care services. No changes to ROS  Podiatric Exam: Vascular: dorsalis pedis and posterior tibial pulses are palpable bilateral. Capillary return is immediate. Temperature gradient is WNL. Skin turgor WNL  Sensorium: Normal Semmes Weinstein monofilament test. Normal tactile sensation bilaterally. Nail Exam: Pt has thick disfigured discolored nails with subungual debris noted bilateral entire nail hallux through fifth toenails Ulcer Exam: There is no evidence of ulcer or pre-ulcerative changes or infection. Orthopedic Exam: Muscle tone and strength are WNL. No limitations in general ROM. No crepitus or effusions noted. Foot type and digits show no abnormalities. HAV  B/L. Dorsal  DJD  B/L Skin: No Porokeratosis. No infection or ulcers  Diagnosis:  Onychomycosis, , Pain in right toe, pain in left toes  Treatment & Plan Procedures and Treatment: Consent by patient was obtained for treatment procedures.   Debridement of mycotic and hypertrophic toenails, 1 through 5 bilateral and clearing of subungual debris. No ulceration, no infection noted.  Return Visit-Office Procedure: Patient instructed to return to the office for a follow up visit 4 months for continued evaluation and treatment.    Helane Gunther DPM

## 2019-03-13 DIAGNOSIS — E119 Type 2 diabetes mellitus without complications: Secondary | ICD-10-CM | POA: Diagnosis not present

## 2019-03-13 DIAGNOSIS — Z79899 Other long term (current) drug therapy: Secondary | ICD-10-CM | POA: Diagnosis not present

## 2019-03-13 DIAGNOSIS — Z7984 Long term (current) use of oral hypoglycemic drugs: Secondary | ICD-10-CM | POA: Diagnosis not present

## 2019-03-13 DIAGNOSIS — F319 Bipolar disorder, unspecified: Secondary | ICD-10-CM | POA: Diagnosis not present

## 2019-03-13 DIAGNOSIS — R569 Unspecified convulsions: Secondary | ICD-10-CM | POA: Diagnosis not present

## 2019-03-13 DIAGNOSIS — N390 Urinary tract infection, site not specified: Secondary | ICD-10-CM | POA: Diagnosis not present

## 2019-03-13 DIAGNOSIS — Z79890 Hormone replacement therapy (postmenopausal): Secondary | ICD-10-CM | POA: Diagnosis not present

## 2019-03-13 DIAGNOSIS — Z880 Allergy status to penicillin: Secondary | ICD-10-CM | POA: Diagnosis not present

## 2019-03-13 DIAGNOSIS — F419 Anxiety disorder, unspecified: Secondary | ICD-10-CM | POA: Diagnosis not present

## 2019-03-13 DIAGNOSIS — Z1159 Encounter for screening for other viral diseases: Secondary | ICD-10-CM | POA: Diagnosis not present

## 2019-03-13 DIAGNOSIS — Z87891 Personal history of nicotine dependence: Secondary | ICD-10-CM | POA: Diagnosis not present

## 2019-03-13 DIAGNOSIS — Z7951 Long term (current) use of inhaled steroids: Secondary | ICD-10-CM | POA: Diagnosis not present

## 2019-03-13 DIAGNOSIS — F0390 Unspecified dementia without behavioral disturbance: Secondary | ICD-10-CM | POA: Diagnosis not present

## 2019-03-13 DIAGNOSIS — Z888 Allergy status to other drugs, medicaments and biological substances status: Secondary | ICD-10-CM | POA: Diagnosis not present

## 2019-03-13 DIAGNOSIS — J449 Chronic obstructive pulmonary disease, unspecified: Secondary | ICD-10-CM | POA: Diagnosis not present

## 2019-03-13 DIAGNOSIS — I5032 Chronic diastolic (congestive) heart failure: Secondary | ICD-10-CM | POA: Diagnosis not present

## 2019-03-17 ENCOUNTER — Other Ambulatory Visit: Payer: Self-pay

## 2019-03-17 IMAGING — CT CT HEAD WITHOUT CONTRAST
3 series · 16 of 46 positions shown, 19 images · non-contrast
Comparison: Head CT scan 12/26/2017 and 06/24/2017.

CLINICAL DATA: Seizure today.  History of seizures.

EXAM:
CT HEAD WITHOUT CONTRAST
TECHNIQUE: Contiguous axial images were obtained from the base of the skull
through the vertex without intravenous contrast.

[Series 2: head wo · axial · 0.41mm/px · z∈[+614,+734]mm · 10 of 29 slices shown, 13 images]
[im 3/29  brain]
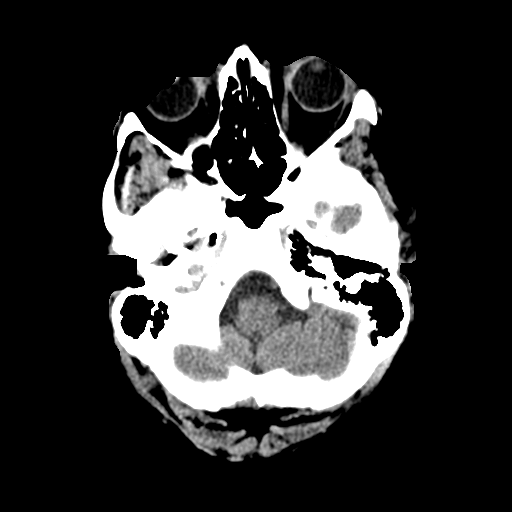
[im 3/29  bone]
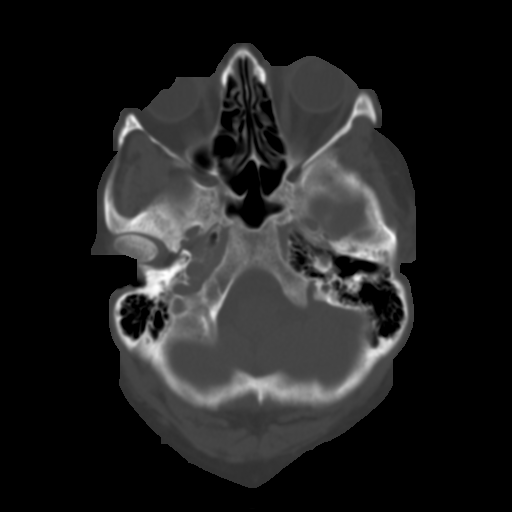
[im 6/29  brain]
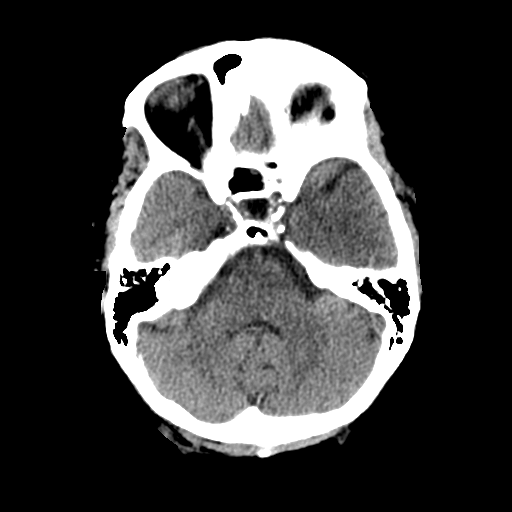
[im 8/29  brain]
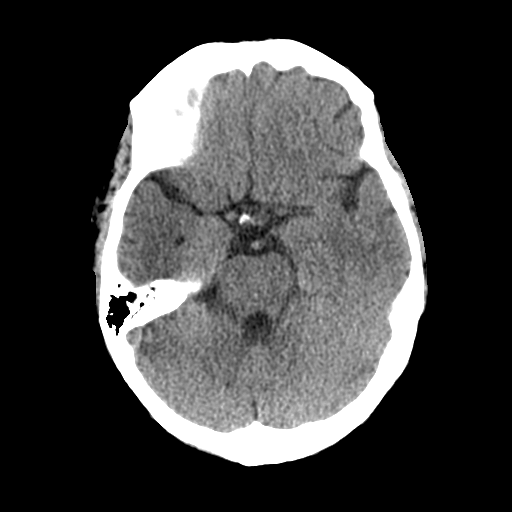
[im 11/29  brain]
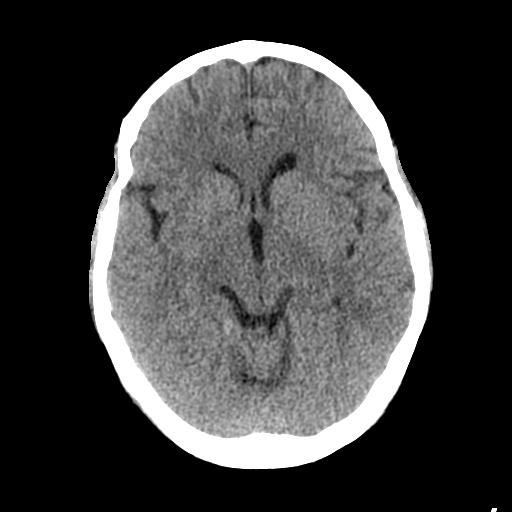
[im 14/29  brain]
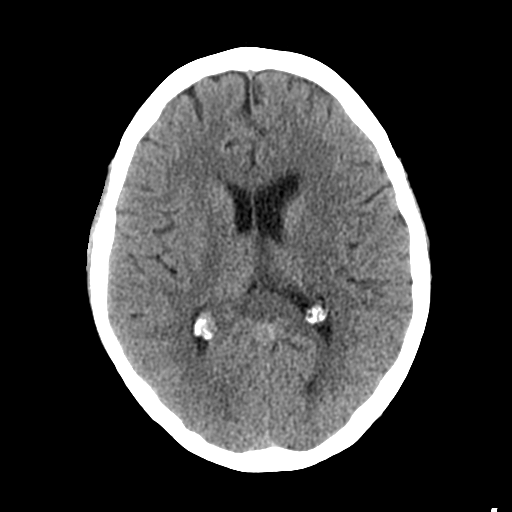
[im 14/29  bone]
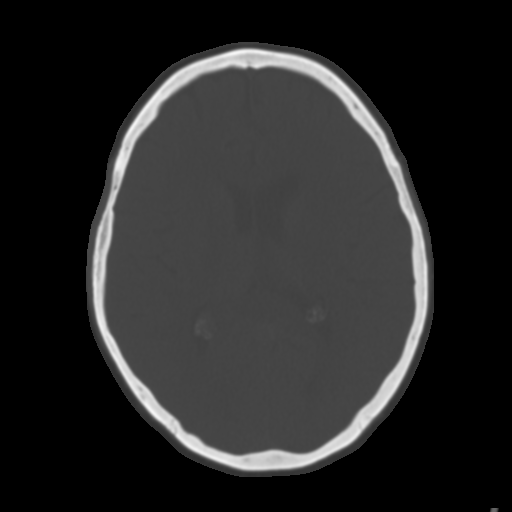
[im 16/29  brain]
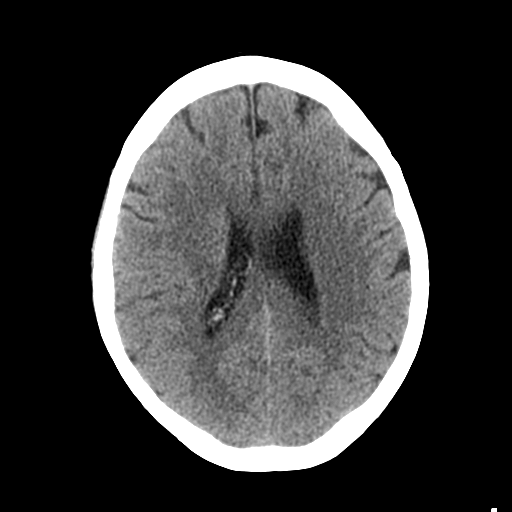
[im 19/29  brain]
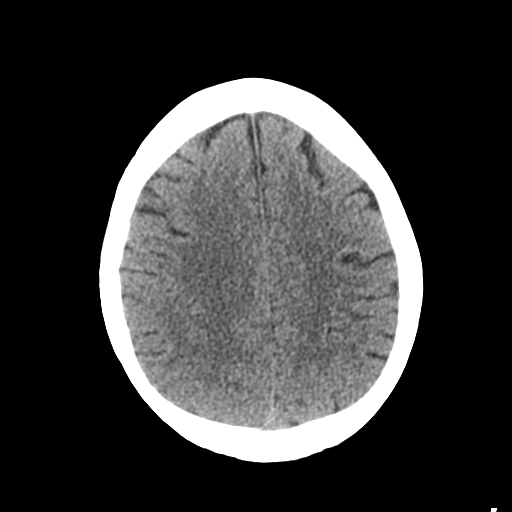
[im 22/29  brain]
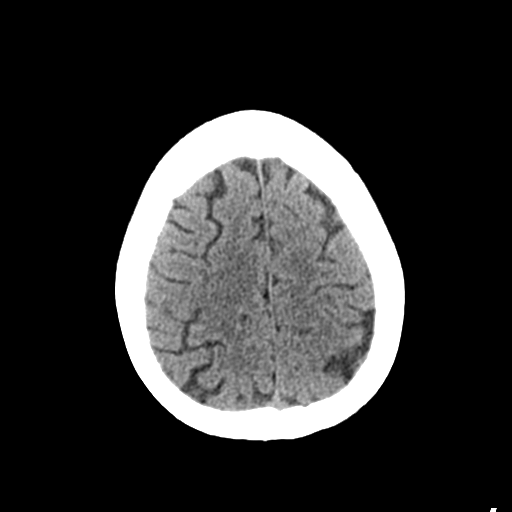
[im 24/29  brain]
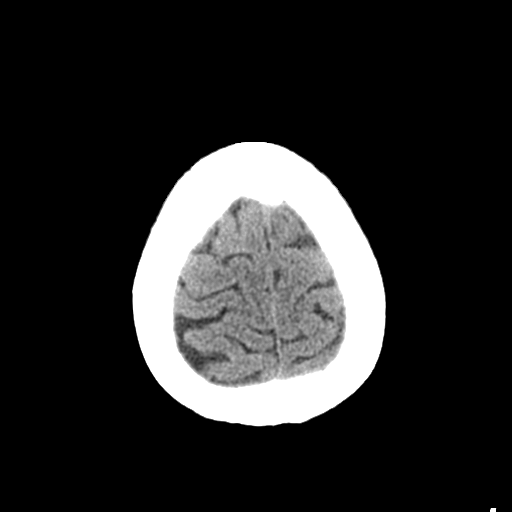
[im 24/29  bone]
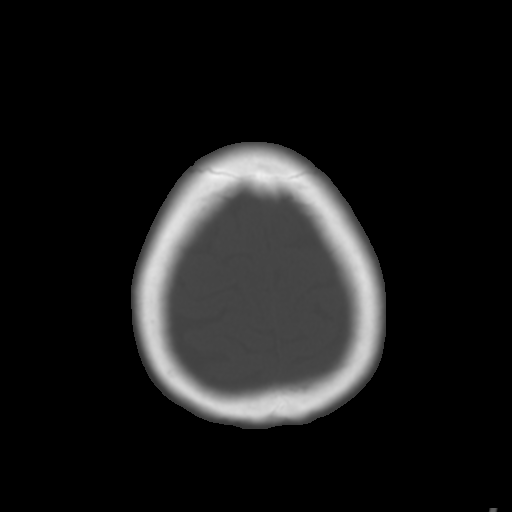
[im 27/29  brain]
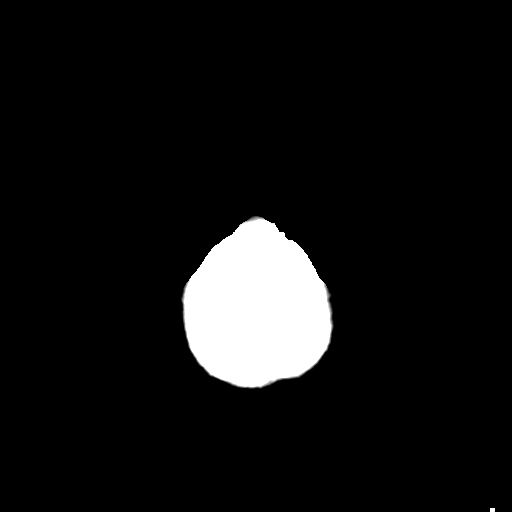

[Series 4: coronal soft tissue · coronal · 0.30mm/px · 3 of 63 slices shown]
[im 21/63  brain]
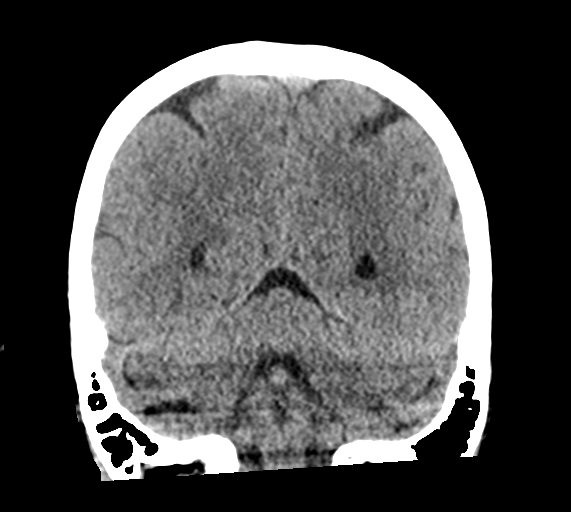
[im 28/63  brain]
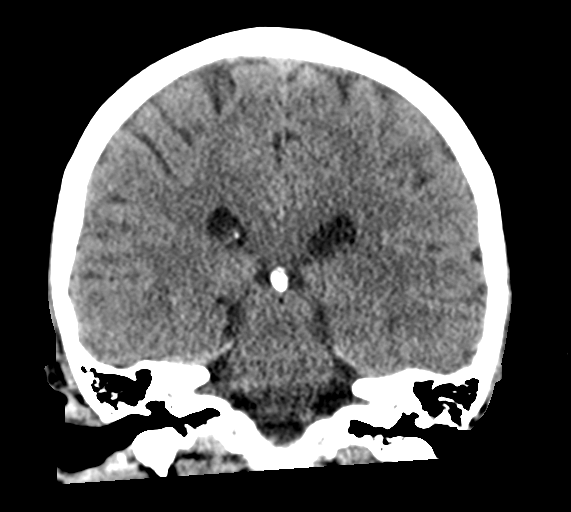
[im 35/63  brain]
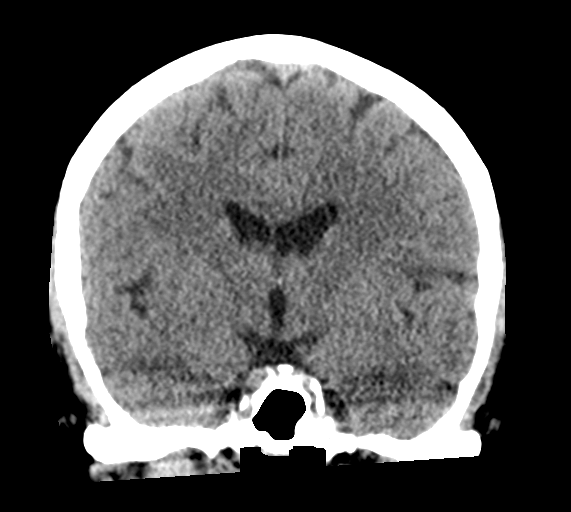

[Series 5: sagittal soft tissue · sagittal · 0.30mm/px · 3 of 52 slices shown]
[im 18/52  brain]
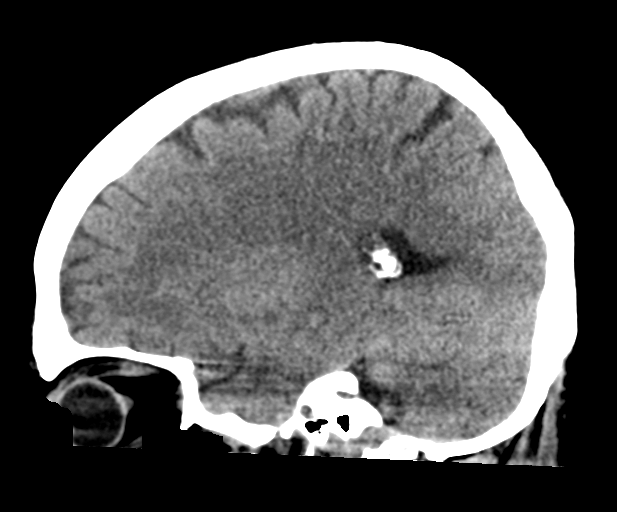
[im 26/52  brain]
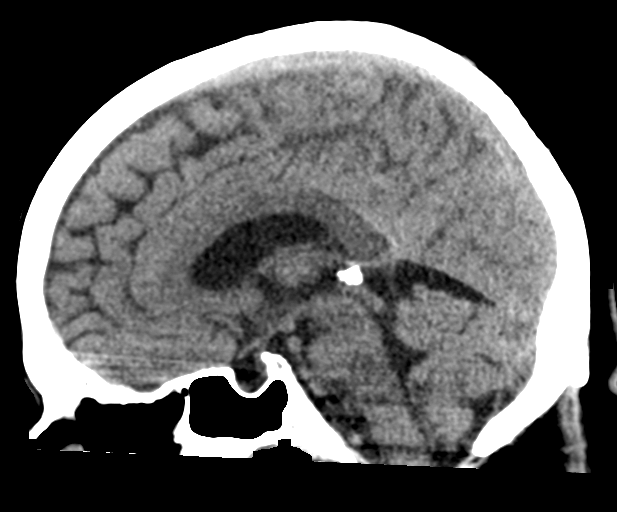
[im 35/52  brain]
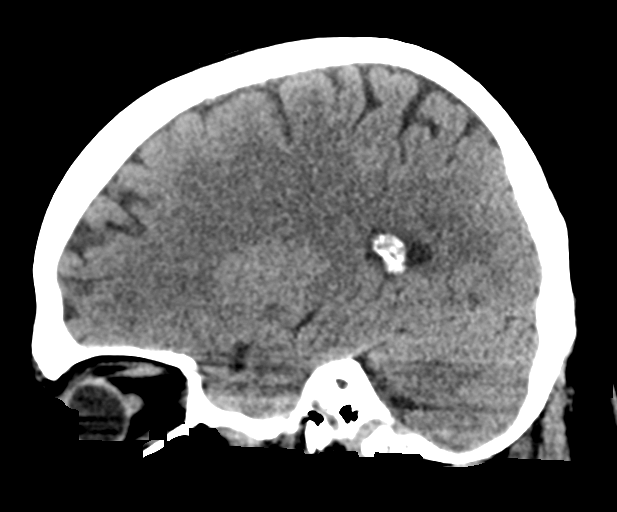

[16 of 46 positions shown; findings below may reference images not displayed]

FINDINGS: Brain: No evidence of acute infarction, hemorrhage, hydrocephalus,
extra-axial collection or mass lesion/mass effect.

Vascular: No hyperdense vessel or unexpected calcification.

Skull: No focal lesion.

Sinuses/Orbits: Negative.

Other: None.
IMPRESSION: Negative head CT.

## 2019-03-18 ENCOUNTER — Other Ambulatory Visit: Payer: Self-pay | Admitting: *Deleted

## 2019-03-18 ENCOUNTER — Encounter: Payer: Self-pay | Admitting: Internal Medicine

## 2019-03-18 ENCOUNTER — Inpatient Hospital Stay: Payer: Medicare Other | Attending: Internal Medicine | Admitting: Internal Medicine

## 2019-03-18 ENCOUNTER — Other Ambulatory Visit: Payer: Self-pay

## 2019-03-18 DIAGNOSIS — D649 Anemia, unspecified: Secondary | ICD-10-CM

## 2019-03-18 MED ORDER — FERROUS SULFATE 325 (65 FE) MG PO TBEC: 325.0000 mg | DELAYED_RELEASE_TABLET | Freq: Every day | ORAL | 3 refills | Status: DC

## 2019-03-18 MED ORDER — CYANOCOBALAMIN 1000 MCG/ML IJ SOLN: INTRAMUSCULAR | 1 refills | Status: DC

## 2019-03-18 NOTE — Progress Notes (Signed)
I connected with Tammy [name] on 03/18/2019 at 10:30 AM EDT by video enabled telemedicine visit and verified that I am speaking with the correct person using two identifiers.  I discussed the limitations, risks, security and privacy concerns of performing an evaluation and management service by telemedicine and the availability of in-person appointments. I also discussed with the patient that there may be a patient responsible charge related to this service. The patient expressed understanding and agreed to proceed.    Other persons participating in the visit and their role in the encounter: RN medical reconciliation/patient's caregiver-patient has bipolar disorder-poor historian. Patient's location: Home Provider's location: Home  Oncology History   No history exists.     Chief Complaint: Iron deficiency anemia  History of present illness:Tammy [name] 58 y.o.  female with history of multiple medical problems including bipolar disorder, chronic diastolic heart failure/COPD is currently a group home resident.  Patient is a poor historian.  She is accompanied by her caregiver.  As per the caregiver patient has had chronic shortness of breath-she however has been noted to have progressive mild worsening fatigue.  No blood in stools or black-colored stools.  As per the caregiver patient might have had a colonoscopy in [place] 4-5 years ago.  No records available.  As per the caregiver no blood in stools or black tarry stools.  No continuing.  Observation/objective:  Assessment and plan: Normocytic anemia # Anemia- iron def- hb 10.4-currently on p.o. iron.  Stable.  #Etiology of iron deficiency-unclear.  Refer to GI.  # Hx of B12 deficiency-recommend B12 injection.  We will send a prescription facility; caregiver will check if this can be given at the facility.    # Multiple medical problems: COPD/Diastolic heart failure/ Bipolar disorder  Thank you Dr.Lindley  for allowing  me to participate in the care of your pleasant patient. Please do not hesitate to contact me with questions or concerns in the interim.'  # DISPO: Stool card x3 [mail to pt] # referral to [facility] re: iron def anemia # follow up in 3 months- MD/labs-cbc/cmp/ldh/iron studies/ferritin/LDH; b12/folate/MM panel; kappa-lamda light chains; UA- Dr.B   Follow-up instructions:  I discussed the assessment and treatment plan with the patient.  The patient was provided an opportunity to ask questions and all were answered.  The patient agreed with the plan and demonstrated understanding of instructions.  The patient was advised to call back or seek an in person evaluation if the symptoms worsen or if the condition fails to improve as anticipated.  Dr. Charlaine Dalton [place] at [facility] 03/31/2019 7:08 AM

## 2019-03-18 NOTE — Assessment & Plan Note (Addendum)
#   Anemia- iron def- hb 10.4-currently on p.o. iron.  Stable.  #Etiology of iron deficiency-unclear.  Refer to GI.  # Hx of B12 deficiency-recommend B12 injection.  We will send a prescription facility; caregiver will check if this can be given at the facility.    # Multiple medical problems: COPD/Diastolic heart failure/ Bipolar disorder  Thank you Dr.Lindley  for allowing me to participate in the care of your pleasant patient. Please do not hesitate to contact me with questions or concerns in the interim.'  # DISPO: Stool card x3 [mail to pt] # referral to [facility] re: iron def anemia # follow up in 3 months- MD/labs-cbc/cmp/ldh/iron studies/ferritin/LDH; b12/folate/MM panel; kappa-lamda light chains; UA- Dr.B

## 2019-03-22 ENCOUNTER — Observation Stay
Admission: EM | Admit: 2019-03-22 | Discharge: 2019-03-23 | Disposition: A | Discharge status: Home Health | Payer: Medicare Other | Attending: Internal Medicine | Admitting: Internal Medicine

## 2019-03-22 ENCOUNTER — Other Ambulatory Visit: Payer: Self-pay

## 2019-03-22 ENCOUNTER — Encounter: Payer: Self-pay | Admitting: *Deleted

## 2019-03-22 ENCOUNTER — Emergency Department: Payer: Medicare Other

## 2019-03-22 ENCOUNTER — Encounter: Payer: Self-pay | Admitting: Emergency Medicine

## 2019-03-22 DIAGNOSIS — I5032 Chronic diastolic (congestive) heart failure: Secondary | ICD-10-CM | POA: Insufficient documentation

## 2019-03-22 DIAGNOSIS — F419 Anxiety disorder, unspecified: Secondary | ICD-10-CM | POA: Insufficient documentation

## 2019-03-22 DIAGNOSIS — R569 Unspecified convulsions: Principal | ICD-10-CM | POA: Insufficient documentation

## 2019-03-22 DIAGNOSIS — Z880 Allergy status to penicillin: Secondary | ICD-10-CM | POA: Insufficient documentation

## 2019-03-22 DIAGNOSIS — F319 Bipolar disorder, unspecified: Secondary | ICD-10-CM | POA: Insufficient documentation

## 2019-03-22 DIAGNOSIS — Z7951 Long term (current) use of inhaled steroids: Secondary | ICD-10-CM | POA: Insufficient documentation

## 2019-03-22 DIAGNOSIS — Z7984 Long term (current) use of oral hypoglycemic drugs: Secondary | ICD-10-CM | POA: Insufficient documentation

## 2019-03-22 DIAGNOSIS — E119 Type 2 diabetes mellitus without complications: Secondary | ICD-10-CM | POA: Insufficient documentation

## 2019-03-22 DIAGNOSIS — Z87891 Personal history of nicotine dependence: Secondary | ICD-10-CM | POA: Insufficient documentation

## 2019-03-22 DIAGNOSIS — Z79890 Hormone replacement therapy (postmenopausal): Secondary | ICD-10-CM | POA: Insufficient documentation

## 2019-03-22 DIAGNOSIS — Z79899 Other long term (current) drug therapy: Secondary | ICD-10-CM | POA: Insufficient documentation

## 2019-03-22 DIAGNOSIS — G40909 Epilepsy, unspecified, not intractable, without status epilepticus: Secondary | ICD-10-CM

## 2019-03-22 DIAGNOSIS — J449 Chronic obstructive pulmonary disease, unspecified: Secondary | ICD-10-CM | POA: Insufficient documentation

## 2019-03-22 DIAGNOSIS — Z888 Allergy status to other drugs, medicaments and biological substances status: Secondary | ICD-10-CM | POA: Insufficient documentation

## 2019-03-22 DIAGNOSIS — Z1159 Encounter for screening for other viral diseases: Secondary | ICD-10-CM | POA: Insufficient documentation

## 2019-03-22 DIAGNOSIS — F0390 Unspecified dementia without behavioral disturbance: Secondary | ICD-10-CM | POA: Insufficient documentation

## 2019-03-22 DIAGNOSIS — N390 Urinary tract infection, site not specified: Secondary | ICD-10-CM | POA: Insufficient documentation

## 2019-03-22 LAB — COMPREHENSIVE METABOLIC PANEL
ALT: 17 U/L (ref 0–44)
AST: 23 U/L (ref 15–41)
Albumin: 3.7 g/dL (ref 3.5–5.0)
Alkaline Phosphatase: 90 U/L (ref 38–126)
Anion gap: 9 (ref 5–15)
BUN: 21 mg/dL — ABNORMAL HIGH (ref 6–20)
CO2: 25 mmol/L (ref 22–32)
Calcium: 9.2 mg/dL (ref 8.9–10.3)
Chloride: 103 mmol/L (ref 98–111)
Creatinine, Ser: 1.08 mg/dL — ABNORMAL HIGH (ref 0.44–1.00)
GFR calc Af Amer: >60 mL/min (ref >60)
GFR calc non Af Amer: 57 mL/min — ABNORMAL LOW (ref >60)
Glucose, Bld: 199 mg/dL — ABNORMAL HIGH (ref 70–99)
Potassium: 4 mmol/L (ref 3.5–5.1)
Sodium: 137 mmol/L (ref 135–145)
Total Bilirubin: 0.5 mg/dL (ref 0.3–1.2)
Total Protein: 6.3 g/dL — ABNORMAL LOW (ref 6.5–8.1)

## 2019-03-22 LAB — CBC WITH DIFFERENTIAL/PLATELET
Abs Immature Granulocytes: 0.06 10*3/uL (ref 0.00–0.07)
Basophils Absolute: 0 10*3/uL (ref 0.0–0.1)
Basophils Relative: 1 %
Eosinophils Absolute: 0.2 10*3/uL (ref 0.0–0.5)
Eosinophils Relative: 3 %
HCT: 36.7 % (ref 36.0–46.0)
Hemoglobin: 10.8 g/dL — ABNORMAL LOW (ref 12.0–15.0)
Immature Granulocytes: 1 %
Lymphocytes Relative: 25 %
Lymphs Abs: 1.8 10*3/uL (ref 0.7–4.0)
MCH: 24.7 pg — ABNORMAL LOW (ref 26.0–34.0)
MCHC: 29.4 g/dL — ABNORMAL LOW (ref 30.0–36.0)
MCV: 83.8 fL (ref 80.0–100.0)
Monocytes Absolute: 0.4 10*3/uL (ref 0.1–1.0)
Monocytes Relative: 6 %
Neutro Abs: 4.6 10*3/uL (ref 1.7–7.7)
Neutrophils Relative %: 64 %
Platelets: 211 10*3/uL (ref 150–400)
RBC: 4.38 MIL/uL (ref 3.87–5.11)
RDW: 17.3 % — ABNORMAL HIGH (ref 11.5–15.5)
WBC: 7.1 10*3/uL (ref 4.0–10.5)
nRBC: 0.4 % — ABNORMAL HIGH (ref 0.0–0.2)

## 2019-03-22 LAB — URINALYSIS, COMPLETE (UACMP) WITH MICROSCOPIC
Bilirubin Urine: NEGATIVE
Glucose, UA: >=500 mg/dL — AB
Hgb urine dipstick: NEGATIVE
Ketones, ur: NEGATIVE mg/dL
Nitrite: POSITIVE — AB
Protein, ur: NEGATIVE mg/dL
Specific Gravity, Urine: 1.003 — ABNORMAL LOW (ref 1.005–1.030)
pH: 6 (ref 5.0–8.0)

## 2019-03-22 LAB — GLUCOSE, CAPILLARY
Glucose-Capillary: 98 mg/dL (ref 70–99)
Glucose-Capillary: 185 mg/dL — ABNORMAL HIGH (ref 70–99)
Glucose-Capillary: 148 mg/dL — ABNORMAL HIGH (ref 70–99)
Glucose-Capillary: 181 mg/dL — ABNORMAL HIGH (ref 70–99)

## 2019-03-22 LAB — SARS CORONAVIRUS 2 BY RT PCR (HOSPITAL ORDER, PERFORMED IN ~~LOC~~ HOSPITAL LAB): SARS Coronavirus 2: NEGATIVE

## 2019-03-22 LAB — LITHIUM LEVEL: Lithium Lvl: 0.15 mmol/L — ABNORMAL LOW (ref 0.60–1.20)

## 2019-03-22 LAB — TROPONIN I: Troponin I: <0.03 ng/mL (ref <0.03)

## 2019-03-22 MED ORDER — LEVOTHYROXINE SODIUM 100 MCG PO TABS
100.0000 ug | ORAL_TABLET | Freq: Every day | ORAL | Status: DC
  Administered 2019-03-23: 100 ug via ORAL
  Filled 2019-03-22: qty 1

## 2019-03-22 MED ORDER — RISPERIDONE 3 MG PO TABS
3.0000 mg | ORAL_TABLET | Freq: Every day | ORAL | Status: DC
  Administered 2019-03-22: 23:00:00 3 mg via ORAL
  Filled 2019-03-22 (×2): qty 1

## 2019-03-22 MED ORDER — ACETAMINOPHEN 650 MG RE SUPP: 650.0000 mg | Freq: Four times a day (QID) | RECTAL | Status: DC | PRN

## 2019-03-22 MED ORDER — FLUTICASONE-UMECLIDIN-VILANT 100-62.5-25 MCG/INH IN AEPB: 1.0000 | INHALATION_SPRAY | Freq: Every day | RESPIRATORY_TRACT | Status: DC

## 2019-03-22 MED ORDER — ALBUTEROL SULFATE (2.5 MG/3ML) 0.083% IN NEBU: 2.5000 mg | INHALATION_SOLUTION | RESPIRATORY_TRACT | Status: DC | PRN

## 2019-03-22 MED ORDER — ONDANSETRON HCL 4 MG PO TABS: 4.0000 mg | ORAL_TABLET | Freq: Four times a day (QID) | ORAL | Status: DC | PRN

## 2019-03-22 MED ORDER — ONDANSETRON HCL 4 MG/2ML IJ SOLN: 4.0000 mg | Freq: Four times a day (QID) | INTRAMUSCULAR | Status: DC | PRN

## 2019-03-22 MED ORDER — DIPHENHYDRAMINE HCL 25 MG PO CAPS: 25.0000 mg | ORAL_CAPSULE | Freq: Every evening | ORAL | Status: DC | PRN

## 2019-03-22 MED ORDER — ACETAMINOPHEN 500 MG PO TABS: 500.0000 mg | ORAL_TABLET | Freq: Every evening | ORAL | Status: DC | PRN

## 2019-03-22 MED ORDER — UMECLIDINIUM BROMIDE 62.5 MCG/INH IN AEPB
1.0000 | INHALATION_SPRAY | Freq: Every day | RESPIRATORY_TRACT | Status: DC
  Administered 2019-03-23: 1 via RESPIRATORY_TRACT
  Filled 2019-03-22: qty 7

## 2019-03-22 MED ORDER — LITHIUM CARBONATE 150 MG PO CAPS
150.0000 mg | ORAL_CAPSULE | Freq: Every day | ORAL | Status: DC
  Administered 2019-03-23: 150 mg via ORAL
  Filled 2019-03-22: qty 1

## 2019-03-22 MED ORDER — ROPINIROLE HCL 1 MG PO TABS
1.0000 mg | ORAL_TABLET | Freq: Every day | ORAL | Status: DC
  Administered 2019-03-22: 1 mg via ORAL
  Filled 2019-03-22: qty 1

## 2019-03-22 MED ORDER — LEVETIRACETAM 500 MG PO TABS
500.0000 mg | ORAL_TABLET | ORAL | Status: AC
  Administered 2019-03-22: 16:00:00 500 mg via ORAL
  Filled 2019-03-22: qty 1

## 2019-03-22 MED ORDER — DIPHENHYDRAMINE-APAP (SLEEP) 25-500 MG PO TABS: 1.0000 | ORAL_TABLET | Freq: Every evening | ORAL | Status: DC | PRN

## 2019-03-22 MED ORDER — INSULIN ASPART 100 UNIT/ML ~~LOC~~ SOLN
0.0000 [IU] | Freq: Three times a day (TID) | SUBCUTANEOUS | Status: DC
  Administered 2019-03-23: 2 [IU] via SUBCUTANEOUS
  Filled 2019-03-22: qty 1

## 2019-03-22 MED ORDER — SODIUM CHLORIDE 0.9 % IV SOLN
1.0000 g | INTRAVENOUS | Status: DC
  Filled 2019-03-22 (×2): qty 10

## 2019-03-22 MED ORDER — TAMSULOSIN HCL 0.4 MG PO CAPS
0.4000 mg | ORAL_CAPSULE | Freq: Every day | ORAL | Status: DC
  Administered 2019-03-23: 0.4 mg via ORAL
  Filled 2019-03-22: qty 1

## 2019-03-22 MED ORDER — ENOXAPARIN SODIUM 40 MG/0.4ML ~~LOC~~ SOLN
40.0000 mg | SUBCUTANEOUS | Status: DC
  Administered 2019-03-22: 23:00:00 40 mg via SUBCUTANEOUS
  Filled 2019-03-22: qty 0.4

## 2019-03-22 MED ORDER — ACETAMINOPHEN 500 MG PO TABS
ORAL_TABLET | ORAL | Status: AC
  Administered 2019-03-22: 1000 mg via ORAL
  Filled 2019-03-22: qty 2

## 2019-03-22 MED ORDER — LORATADINE 10 MG PO TABS
10.0000 mg | ORAL_TABLET | Freq: Every day | ORAL | Status: DC
  Administered 2019-03-23: 09:00:00 10 mg via ORAL
  Filled 2019-03-22: qty 1

## 2019-03-22 MED ORDER — ACETAMINOPHEN 325 MG PO TABS
650.0000 mg | ORAL_TABLET | Freq: Four times a day (QID) | ORAL | Status: DC | PRN
  Administered 2019-03-22 – 2019-03-23 (×2): 650 mg via ORAL
  Filled 2019-03-22 (×2): qty 2

## 2019-03-22 MED ORDER — ALBUTEROL SULFATE HFA 108 (90 BASE) MCG/ACT IN AERS: 2.0000 | INHALATION_SPRAY | RESPIRATORY_TRACT | Status: DC | PRN

## 2019-03-22 MED ORDER — PRAVASTATIN SODIUM 20 MG PO TABS: 40.0000 mg | ORAL_TABLET | Freq: Every day | ORAL | Status: DC

## 2019-03-22 MED ORDER — INSULIN ASPART 100 UNIT/ML ~~LOC~~ SOLN
0.0000 [IU] | Freq: Every day | SUBCUTANEOUS | Status: DC
  Administered 2019-03-22: 3 [IU] via SUBCUTANEOUS
  Filled 2019-03-22: qty 1

## 2019-03-22 MED ORDER — FLUTICASONE PROPIONATE 50 MCG/ACT NA SUSP
1.0000 | Freq: Every day | NASAL | Status: DC
  Administered 2019-03-23: 12:00:00 1 via NASAL
  Filled 2019-03-22 (×2): qty 16

## 2019-03-22 MED ORDER — ACETAMINOPHEN 500 MG PO TABS
1000.0000 mg | ORAL_TABLET | ORAL | Status: AC
  Administered 2019-03-22: 1000 mg via ORAL

## 2019-03-22 MED ORDER — LEVETIRACETAM IN NACL 500 MG/100ML IV SOLN
500.0000 mg | Freq: Once | INTRAVENOUS | Status: DC
  Filled 2019-03-22: qty 100

## 2019-03-22 MED ORDER — POLYETHYLENE GLYCOL 3350 17 G PO PACK: 17.0000 g | PACK | Freq: Every day | ORAL | Status: DC | PRN

## 2019-03-22 MED ORDER — BUSPIRONE HCL 15 MG PO TABS
30.0000 mg | ORAL_TABLET | Freq: Two times a day (BID) | ORAL | Status: DC
  Administered 2019-03-22: 30 mg via ORAL
  Filled 2019-03-22 (×2): qty 2

## 2019-03-22 MED ORDER — FLUTICASONE FUROATE-VILANTEROL 100-25 MCG/INH IN AEPB
1.0000 | INHALATION_SPRAY | Freq: Every day | RESPIRATORY_TRACT | Status: DC
  Administered 2019-03-23: 09:00:00 1 via RESPIRATORY_TRACT
  Filled 2019-03-22: qty 28

## 2019-03-22 MED ORDER — CARVEDILOL 3.125 MG PO TABS
3.1250 mg | ORAL_TABLET | Freq: Two times a day (BID) | ORAL | Status: DC
  Administered 2019-03-23: 3.125 mg via ORAL
  Filled 2019-03-22: qty 1

## 2019-03-22 NOTE — Consult Note (Signed)
Cc: sz.  58 y/o sho lives in a group jome, h/o dementia, bipolar, asthma, CHF, copd, DM and epilepsy( could not find any AEDs in her chartm but patient states that sghe had sz before) admitted for sz. Patient is not a good historian but from what I can ghater had sz around 11:00 am. Per Er doc , patient had 2 briefs sz witnessed by EMS that did not need medication.  Head ct: No acute intracranial abnormality. Labs Na: 133. Patient seems to back  to baseline when I examined.  Vitals per EMR   Neuro exam: Patient is alert, awake, speech is nle, followinf commands CN: perrla, eomi, no nystgamus, face symmetrical, face sensation is nle, palate and tongue midline No motor deficit apprecated  no snesory deficit appreciated dtr not assessed No coordination deficit appreciated Gait exam deffered   A/p: 58 y/o with h/o dementia, CHF, copd, DM, epilepsy presents to ER with 2 sz. Neuro exam is none focal. Head ct w/o acute abnlities. Per chart, patient carries history of epilepsy but I could not find any AED treatment. Would admit the patient for observation to rule out provoked sz  2/2 metabolilc/electrolytes abnliites that should be corrected , infection that should be treated and so forth. Will start keppra 500 mg BID that can be weaned if no further sz activities and for that she will need a follow up neurology as OTP. PT/OT. If no further clinical seizures, patient can b d/ced  With OTP neurology follow if she is medically stable

## 2019-03-22 NOTE — H&P (Addendum)
[facility] [place] at [facility]   PATIENT NAME: Tammy [name]    MR#:  [id number]  DATE OF BIRTH:  Mar 21, 1961  DATE OF ADMISSION:  03/22/2019  PRIMARY CARE PHYSICIAN: Armando GangLindley, Cheryl P, FNP   REQUESTING/REFERRING PHYSICIAN: Dr. Fanny BienQuale  CHIEF COMPLAINT:   Chief Complaint  Patient presents with  . Seizures    HISTORY OF PRESENT ILLNESS:  Tammy [name]  is a 58 y.o. female with a known history of epilepsy, dementia, bipolar disorder presents from local nursing home due to 2 episodes of seizures.  These were generalized tonic-clonic seizure per ED staff.  Patient has been seen by neurology and loaded with oral Keppra.  5 mg twice daily.  Admit for observation.  Patient is poor historian due to dementia.  No complaints of pain or shortness of breath.  Afebrile. CT scan of the head showed nothing acute  PAST MEDICAL HISTORY:   Past Medical History:  Diagnosis Date  . Anxiety   . Asthma   . Bipolar 1 disorder (HCC)   . CHF (congestive heart failure) (HCC)   . COPD (chronic obstructive pulmonary disease) (HCC)   . Dementia (HCC)   . Epilepsy (HCC)   . Seizures (HCC)     PAST SURGICAL HISTORY:   Past Surgical History:  Procedure Laterality Date  . APPENDECTOMY    . hernia reapir     x 3      SOCIAL HISTORY:   Social History   Tobacco Use  . Smoking status: Former Smoker    Quit date: 10/07/1998    Years since quitting: 20.4  . Smokeless tobacco: Never Used  Substance Use Topics  . Alcohol use: No    FAMILY HISTORY:   Family History  Problem Relation Age of Onset  . Hypertension Mother   . Kidney cancer Neg Hx   . Bladder Cancer Neg Hx     DRUG ALLERGIES:   Allergies  Allergen Reactions  . Penicillins Rash    Has patient had a PCN reaction causing immediate rash, facial/tongue/throat swelling, SOB or lightheadedness with hypotension: No Has patient had a PCN reaction causing severe rash involving mucus membranes or skin necrosis:  No Has patient had a PCN reaction that required hospitalization: No Has patient had a PCN reaction occurring within the last 10 years: No If all of the above answers are "NO", then may proceed with Cephalosporin use.   . Prednisone Rash    REVIEW OF SYSTEMS:   Review of Systems  Constitutional: Positive for malaise/fatigue. Negative for chills and fever.  HENT: Negative for sore throat.   Eyes: Negative for blurred vision, double vision and pain.  Respiratory: Negative for cough, hemoptysis, shortness of breath and wheezing.   Cardiovascular: Negative for chest pain, palpitations, orthopnea and leg swelling.  Gastrointestinal: Negative for abdominal pain, constipation, diarrhea, heartburn, nausea and vomiting.  Genitourinary: Negative for dysuria and hematuria.  Musculoskeletal: Negative for back pain and joint pain.  Skin: Negative for rash.  Neurological: Negative for sensory change, speech change, focal weakness and headaches.  Endo/Heme/Allergies: Does not bruise/bleed easily.  Psychiatric/Behavioral: Negative for depression. The patient is not nervous/anxious.     MEDICATIONS AT HOME:   Prior to Admission medications   Medication Sig Start Date End Date Taking? Authorizing Provider  acetaminophen (TYLENOL) 500 MG tablet Take 500-1,000 mg by mouth daily as needed.     [provider]  albuterol (PROVENTIL HFA;VENTOLIN HFA) 108 (90 BASE) MCG/ACT inhaler Inhale 4-6 puffs by  mouth every 4 hours as needed for wheezing, cough, and/or shortness of breath 07/01/15   Loleta RoseForbach, Cory, MD  benzonatate (TESSALON) 100 MG capsule Take 100 mg by mouth 3 (three) times daily as needed for cough.    [provider]  busPIRone (BUSPAR) 30 MG tablet Take 30 mg by mouth 2 (two) times daily.     [provider]  carvedilol (COREG) 3.125 MG tablet Take 3.125 mg by mouth 2 (two) times daily with a meal.     [provider]  cetirizine (ZYRTEC) 10 MG tablet Take 10 mg  by mouth daily.    [provider]  cyanocobalamin (,VITAMIN B-12,) 1000 MCG/ML injection 1000 mcg IM Once a month 03/18/19   Borders, Daryl EasternJoshua R, NP  diphenhydramine-acetaminophen (TYLENOL PM) 25-500 MG TABS tablet Take 1 tablet by mouth at bedtime as needed.    [provider]  empagliflozin (JARDIANCE) 10 MG TABS tablet Take 10 mg by mouth daily.     [provider]  ferrous sulfate 325 (65 FE) MG EC tablet Take 1 tablet (325 mg total) by mouth daily with breakfast. 03/18/19   Borders, Daryl EasternJoshua R, NP  fluticasone (FLONASE) 50 MCG/ACT nasal spray Place 1 spray into both nostrils daily.     [provider]  levothyroxine (SYNTHROID, LEVOTHROID) 100 MCG tablet Take 100 mcg by mouth daily before breakfast.     [provider]  lithium carbonate 150 MG capsule Take 150 mg by mouth daily.     [provider]  metFORMIN (GLUCOPHAGE) 500 MG tablet Take 500 mg by mouth 2 (two) times daily with a meal.    [provider]  Omega-3 Fatty Acids (FISH OIL) 1000 MG CAPS Take 1 capsule by mouth 2 (two) times daily.    [provider]  pravastatin (PRAVACHOL) 40 MG tablet Take 40 mg by mouth daily.    [provider]  risperiDONE (RISPERDAL) 3 MG tablet Take 3 mg by mouth at bedtime.     [provider]  rOPINIRole (REQUIP) 1 MG tablet Take 1 mg by mouth at bedtime.    [provider]  tamsulosin (FLOMAX) 0.4 MG CAPS capsule Take 1 capsule (0.4 mg total) by mouth daily. 03/25/17   McGowan, Shannon A, PA-C  TRELEGY ELLIPTA 100-62.5-25 MCG/INH AEPB 1 puff daily.  11/17/18   [provider]  Vitamin D, Ergocalciferol, (DRISDOL) 1.25 MG (50000 UT) CAPS capsule Take 50,000 Units by mouth every 7 (seven) days.    [provider]     VITAL SIGNS:  Blood pressure 122/62, pulse 62, temperature 98.2 F (36.8 C), temperature source Oral, resp. rate 18, height 5\' 2"  (1.575 m), weight 81.6 kg, SpO2 100  %.  PHYSICAL EXAMINATION:  Physical Exam  GENERAL:  58 y.o.-year-old patient lying in the bed with no acute distress.  EYES: Pupils equal, round, reactive to light and accommodation. No scleral icterus. Extraocular muscles intact.  HEENT: Head atraumatic, normocephalic. Oropharynx and nasopharynx clear. No oropharyngeal erythema, moist oral mucosa  NECK:  Supple, no jugular venous distention. No thyroid enlargement, no tenderness.  LUNGS: Normal breath sounds bilaterally, no wheezing, rales, rhonchi. No use of accessory muscles of respiration.  CARDIOVASCULAR: S1, S2 normal. No murmurs, rubs, or gallops.  ABDOMEN: Soft, nontender, nondistended. Bowel sounds present. No organomegaly or mass.  EXTREMITIES: No pedal edema, cyanosis, or clubbing. + 2 pedal & radial pulses b/l.   NEUROLOGIC: Cranial nerves II through XII are intact. No focal Motor or sensory  deficits appreciated b/l PSYCHIATRIC: The patient is alert and awake. SKIN: No obvious rash, lesion, or ulcer.   LABORATORY PANEL:   CBC Recent Labs  Lab 03/22/19 1249  WBC 7.1  HGB 10.8*  HCT 36.7  PLT 211   ------------------------------------------------------------------------------------------------------------------  Chemistries  Recent Labs  Lab 03/22/19 1249  NA 137  K 4.0  CL 103  CO2 25  GLUCOSE 199*  BUN 21*  CREATININE 1.08*  CALCIUM 9.2  AST 23  ALT 17  ALKPHOS 90  BILITOT 0.5   ------------------------------------------------------------------------------------------------------------------  Cardiac Enzymes Recent Labs  Lab 03/22/19 1249  TROPONINI <0.03   ------------------------------------------------------------------------------------------------------------------  RADIOLOGY:  Ct Head Wo Contrast  Result Date: 03/22/2019 CLINICAL DATA:  Postictal. EXAM: CT HEAD WITHOUT CONTRAST TECHNIQUE: Contiguous axial images were obtained from the base of the skull through the vertex without  intravenous contrast. COMPARISON:  12/15/2018 FINDINGS: Brain: No evidence of acute infarction, hemorrhage, hydrocephalus, extra-axial collection or mass lesion/mass effect. Vascular: No hyperdense vessel or unexpected calcification. Skull: Normal. Negative for fracture or focal lesion. Sinuses/Orbits: No acute finding. Other: None. IMPRESSION: No acute intracranial abnormality. Electronically Signed   By: Fidela Salisbury M.D.   On: 03/22/2019 13:31     IMPRESSION AND PLAN:   *Seizures.  Patient started on Keppra founder twice daily.  Seizure precautions.  Admit under observation.  Appreciate neurology input.  CT head normal.  Likely discharge tomorrow if no further seizures and outpatient neurology follow-up.  * UTI Start ceftriaxone Ucx ordered  *Chronic diastolic CHF is stable.  Continue home medications  *Dementia.  Monitor for inpatient delirium  DVT prophylaxis with Lovenox  All the records are reviewed and case discussed with ED provider. Management plans discussed with the patient, family and they are in agreement.  CODE STATUS: Full code  TOTAL TIME TAKING CARE OF THIS PATIENT: 40 minutes.   Tammy [name] M.D on 03/22/2019 at 5:57 PM  Between 7am to 6pm - Pager - [phone]  After 6pm go to [email or web address] - password [email or web address] [facility]  Office  [phone]  CC: Primary care physician; Remi Haggard, FNP  Note: This dictation was prepared with Dragon dictation along with smaller phrase technology. Any transcriptional errors that result from this process are unintentional.

## 2019-03-22 NOTE — Plan of Care (Signed)
  Problem: Education: Goal: Expressions of having a comfortable level of knowledge regarding the disease process will increase Outcome: Progressing   Problem: Coping: Goal: Ability to adjust to condition or change in health will improve Outcome: Progressing Goal: Ability to identify appropriate support needs will improve Outcome: Progressing   Problem: Health Behavior/Discharge Planning: Goal: Compliance with prescribed medication regimen will improve Outcome: Progressing   Problem: Medication: Goal: Risk for medication side effects will decrease Outcome: Progressing   Problem: Clinical Measurements: Goal: Complications related to the disease process, condition or treatment will be avoided or minimized Outcome: Progressing Goal: Diagnostic test results will improve Outcome: Progressing   Problem: Safety: Goal: Verbalization of understanding the information provided will improve Outcome: Progressing   Problem: Self-Concept: Goal: Level of anxiety will decrease Outcome: Progressing Goal: Ability to verbalize feelings about condition will improve Outcome: Progressing   Problem: Education: Goal: Knowledge of [facility] Education information/materials will improve Outcome: Progressing Goal: Emotional status will improve Outcome: Progressing Goal: Mental status will improve Outcome: Progressing Goal: Verbalization of understanding the information provided will improve Outcome: Progressing   Problem: Activity: Goal: Interest or engagement in activities will improve Outcome: Progressing Goal: Sleeping patterns will improve Outcome: Progressing   Problem: Coping: Goal: Ability to verbalize frustrations and anger appropriately will improve Outcome: Progressing Goal: Ability to demonstrate self-control will improve Outcome: Progressing   Problem: Health Behavior/Discharge Planning: Goal: Identification of resources available to assist in meeting health care  needs will improve Outcome: Progressing Goal: Compliance with treatment plan for underlying cause of condition will improve Outcome: Progressing   Problem: Physical Regulation: Goal: Ability to maintain clinical measurements within normal limits will improve Outcome: Progressing   Problem: Safety: Goal: Periods of time without injury will increase Outcome: Progressing

## 2019-03-22 NOTE — ED Triage Notes (Signed)
Arrives from group home post seizure x 2 today. Patient oriented to person place and month. Knows she had a seizure. MAE well.

## 2019-03-22 NOTE — Progress Notes (Signed)
Advance care planning  Purpose of Encounter Seizure  Parties in Attendance Patient, sister Tammy [name] over the phone  Patients Decisional capacity Alert and oriented.  Discussed in detail regarding seizures.  Treatment plan , prognosis discussed.  All questions answered  Discussed in detail and wishes are for aggressive medical care and full CODE STATUS  Orders entered and CODE STATUS changed  FULL CODE  Time spent - 17 minutes

## 2019-03-22 NOTE — ED Provider Notes (Signed)
[facility] Emergency Department Provider Note    ____________________________________________   First MD Initiated Contact with Patient 03/22/19 1756     (approximate)  I have reviewed the triage vital signs and the nursing notes.   HISTORY  Chief Complaint Seizures  EM caveat:  patient does not recall seizures has a history of some dementia  HPI Tammy [name] is a 58 y.o. female here for evaluation after 2 seizures   Patient reports is been in her normal health she has had seizures in the past  Group home called today as she had a seizure that was reported to be generalized lasting about 5 minutes.  Better by the time EMS arrived but then had an additional seizure that lasted maybe not more than 30 seconds with EMS care and did not require intervention prior to sit ending.  EMS reports her baseline is return to alert and oriented at this time by time of ER arrival.  Patient denies any recent illness fevers chills cough chest pain or any symptoms at all.  She reports she feels okay and has had seizures in the past but is not aware if she is any on medication for review of her medication list does not seem to suggest she is on any antiepileptic either  Past Medical History:  Diagnosis Date  . Anxiety   . Asthma   . Bipolar 1 disorder ([place])   . CHF (congestive heart failure) ([place])   . COPD (chronic obstructive pulmonary disease) ([place])   . Dementia ([place])   . Epilepsy ([place])   . Seizures [facility])     Patient Active Problem List   Diagnosis Date Noted  . Seizure ([place]) 03/22/2019  . Normocytic anemia 03/18/2019  . Influenza A 11/10/2018  . Hyponatremia 10/26/2018  . Syncopal episodes 06/23/2017  . Acute on chronic respiratory failure with hypoxia ([place]) 03/15/2017  . COPD with acute exacerbation ([place]) 03/15/2017  . Acute on chronic respiratory failure ([place]) 03/15/2017    Past Surgical History:  Procedure Laterality Date  . APPENDECTOMY    .  hernia reapir     x 3      Prior to Admission medications   Medication Sig Start Date End Date Taking? Authorizing Provider  acetaminophen (TYLENOL) 500 MG tablet Take 500-1,000 mg by mouth daily as needed.     [provider]  albuterol (PROVENTIL HFA;VENTOLIN HFA) 108 (90 BASE) MCG/ACT inhaler Inhale 4-6 puffs by mouth every 4 hours as needed for wheezing, cough, and/or shortness of breath 07/01/15   Hinda Kehr, MD  benzonatate (TESSALON) 100 MG capsule Take 100 mg by mouth 3 (three) times daily as needed for cough.    [provider]  busPIRone (BUSPAR) 30 MG tablet Take 30 mg by mouth 2 (two) times daily.     [provider]  carvedilol (COREG) 3.125 MG tablet Take 3.125 mg by mouth 2 (two) times daily with a meal.     [provider]  cetirizine (ZYRTEC) 10 MG tablet Take 10 mg by mouth daily.    [provider]  cyanocobalamin (,VITAMIN B-12,) 1000 MCG/ML injection 1000 mcg IM Once a month 03/18/19   Borders, Kirt Boys, NP  diphenhydramine-acetaminophen (TYLENOL PM) 25-500 MG TABS tablet Take 1 tablet by mouth at bedtime as needed.    [provider]  empagliflozin (JARDIANCE) 10 MG TABS tablet Take 10 mg by mouth daily.     [provider]  ferrous sulfate 325 (65 FE)  MG EC tablet Take 1 tablet (325 mg total) by mouth daily with breakfast. 03/18/19   Borders, Daryl EasternJoshua R, NP  fluticasone (FLONASE) 50 MCG/ACT nasal spray Place 1 spray into both nostrils daily.     [provider]  levothyroxine (SYNTHROID, LEVOTHROID) 100 MCG tablet Take 100 mcg by mouth daily before breakfast.     [provider]  lithium carbonate 150 MG capsule Take 150 mg by mouth daily.     [provider]  metFORMIN (GLUCOPHAGE) 500 MG tablet Take 500 mg by mouth 2 (two) times daily with a meal.    [provider]  Omega-3 Fatty Acids (FISH OIL) 1000 MG CAPS Take 1 capsule by mouth 2 (two) times daily.    [provider]  pravastatin (PRAVACHOL) 40 MG tablet Take 40 mg by mouth daily.    [provider]  risperiDONE (RISPERDAL) 3 MG tablet Take 3 mg by mouth at bedtime.     [provider]  rOPINIRole (REQUIP) 1 MG tablet Take 1 mg by mouth at bedtime.    [provider]  tamsulosin (FLOMAX) 0.4 MG CAPS capsule Take 1 capsule (0.4 mg total) by mouth daily. 03/25/17   McGowan, Shannon A, PA-C  TRELEGY ELLIPTA 100-62.5-25 MCG/INH AEPB 1 puff daily.  11/17/18   [provider]  Vitamin D, Ergocalciferol, (DRISDOL) 1.25 MG (50000 UT) CAPS capsule Take 50,000 Units by mouth every 7 (seven) days.    [provider]    Allergies Penicillins and Prednisone  Family History  Problem Relation Age of Onset  . Hypertension Mother   . Kidney cancer Neg Hx   . Bladder Cancer Neg Hx     Social History Social History   Tobacco Use  . Smoking status: Former Smoker    Quit date: 10/07/1998    Years since quitting: 20.4  . Smokeless tobacco: Never Used  Substance Use Topics  . Alcohol use: No  . Drug use: No    Review of Systems Constitutional: No fever/chills Eyes: No visual changes. ENT: No sore throat.  No neck pain. Cardiovascular: Denies chest pain. Respiratory: Denies shortness of breath. Gastrointestinal: No abdominal pain.   Genitourinary: Negative for dysuria. Musculoskeletal: Negative for back pain. Skin: Negative for rash. Neurological: Negative for areas of focal weakness or numbness.  Patient reports just a very light headache.  Like some Tylenol.    ____________________________________________   PHYSICAL EXAM:  VITAL SIGNS: ED Triage Vitals  Enc Vitals Group     BP 03/22/19 1246 122/62     Pulse Rate 03/22/19 1246 62     Resp 03/22/19 1246 18     Temp 03/22/19 1246 98.2 F (36.8 C)     Temp Source 03/22/19 1246 Oral     SpO2 03/22/19 1246 100 %     Weight 03/22/19 1247 180 lb (81.6 kg)     Height 03/22/19 1247 5\' 2"  (1.575  m)     Head Circumference --      Peak Flow --      Pain Score 03/22/19 1247 8     Pain Loc --      Pain Edu? --      Excl. in GC? --     Constitutional: Alert and oriented. Well appearing and in no acute distress.  She appears alert oriented to self and being in the hospital tells me she does not know the date. Eyes: Conjunctivae are normal. Head: Atraumatic. Nose: No congestion/rhinnorhea. Mouth/Throat: Mucous membranes  are moist. Neck: No stridor.  Cardiovascular: Normal rate, regular rhythm. Grossly normal heart sounds.  Good peripheral circulation. Respiratory: Normal respiratory effort.  No retractions. Lungs CTAB. Gastrointestinal: Soft and nontender. No distention. Musculoskeletal: No lower extremity tenderness nor edema. Neurologic:  Normal speech and language. No gross focal neurologic deficits are appreciated.  Skin:  Skin is warm, dry and intact. No rash noted. Psychiatric: Mood and affect are normal. Speech and behavior are normal.  ____________________________________________   LABS (all labs ordered are listed, but only abnormal results are displayed)  Labs Reviewed  CBC WITH DIFFERENTIAL/PLATELET - Abnormal; Notable for the following components:      Result Value   Hemoglobin 10.8 (*)    MCH 24.7 (*)    MCHC 29.4 (*)    RDW 17.3 (*)    nRBC 0.4 (*)    All other components within normal limits  COMPREHENSIVE METABOLIC PANEL - Abnormal; Notable for the following components:   Glucose, Bld 199 (*)    BUN 21 (*)    Creatinine, Ser 1.08 (*)    Total Protein 6.3 (*)    GFR calc non Af Amer 57 (*)    All other components within normal limits  URINALYSIS, COMPLETE (UACMP) WITH MICROSCOPIC - Abnormal; Notable for the following components:   Color, Urine YELLOW (*)    APPearance HAZY (*)    Specific Gravity, Urine 1.003 (*)    Glucose, UA >=500 (*)    Nitrite POSITIVE (*)    Leukocytes,Ua TRACE (*)    Bacteria, UA RARE (*)    All other components within  normal limits  GLUCOSE, CAPILLARY - Abnormal; Notable for the following components:   Glucose-Capillary 185 (*)    All other components within normal limits  TROPONIN I  GLUCOSE, CAPILLARY  CBG MONITORING, ED  CBG MONITORING, ED  CBG MONITORING, ED  CBG MONITORING, ED  CBG MONITORING, ED  CBG MONITORING, ED  CBG MONITORING, ED  CBG MONITORING, ED  CBG MONITORING, ED   ____________________________________________  EKG  Reviewed entered by me at 1430 Heart rate 60 QRS 149 QTc 450 Right bundle branch block, no acute ischemia noted ____________________________________________  RADIOLOGY  Ct Head Wo Contrast  Result Date: 03/22/2019 CLINICAL DATA:  Postictal. EXAM: CT HEAD WITHOUT CONTRAST TECHNIQUE: Contiguous axial images were obtained from the base of the skull through the vertex without intravenous contrast. COMPARISON:  12/15/2018 FINDINGS: Brain: No evidence of acute infarction, hemorrhage, hydrocephalus, extra-axial collection or mass lesion/mass effect. Vascular: No hyperdense vessel or unexpected calcification. Skull: Normal. Negative for fracture or focal lesion. Sinuses/Orbits: No acute finding. Other: None. IMPRESSION: No acute intracranial abnormality. Electronically Signed   By: Ted Mcalpineobrinka  Dimitrova M.D.   On: 03/22/2019 13:31    CT head negative for acute ____________________________________________   PROCEDURES  Procedure(s) performed: None  Procedures  Critical Care performed: No  ____________________________________________   INITIAL IMPRESSION / ASSESSMENT AND PLAN / ED COURSE  Pertinent labs & imaging results that were available during my care of the patient were reviewed by me and considered in my medical decision making (see chart for details).   Patient for evaluation of 2 witnessed seizures.  In the past she has had provoked seizures with UTI and hyponatremia noted.  Does not appear to been a antiepileptic drugs.  Consultation placed to neurology,  head CT lab work-up reviewed.  Hemodynamically stable appears to be alert well oriented to what appears to be her baseline.  No evidence of ongoing seizure activity at  this time  Clinical Course as of Mar 21 1757  Mon Mar 22, 2019  1435 Patient seen by her neurologist, he is recommending overnight observation, follow-up on pending lab work, and initiate Keppra 500 mg.   [MQ]  1507 Difficult stick. Awaiting IV team for access.   [MQ]    Clinical Course User Index [MQ] Sharyn CreamerQuale, Mark, MD    ----------------------------------------- 6:01 PM on 03/22/2019 -----------------------------------------  Appreciate neurology consultation.  Will admit for further work-up and care.  Urine culture ordered ____________________________________________   FINAL CLINICAL IMPRESSION(S) / ED DIAGNOSES  Final diagnoses:  Seizures (HCC)        Note:  This document was prepared using Dragon voice recognition software and may include unintentional dictation errors       Sharyn CreamerQuale, Mark, MD 03/22/19 2123

## 2019-03-22 NOTE — TOC Progression Note (Signed)
Transition of Care [facility]) - Progression Note    Patient Details  Name: Tammy [name] MRN: [id number] Date of Birth: [birth year]  [facility]) CM/SW Contact  Marshell Garfinkel, RN Phone Number: 03/22/2019, 1:29 PM  Clinical Narrative:    ED RNCM spoke with Simona Huh at [facility] [phone]. Patient sister Chryl Heck [facility]): [phone] is primary contact for medical needs.  Wears diaper but is continent of bowel and bladder.  PCP Threasa Alpha; no problems obtaining medications.She is on chronic supplemental O2 through [place]. At baseline patient is ambulatory and attends a daycare program daily.  She takes herself to bathroom, walks and feeds herself without difficulty. She was recently closed out with [facility] home health. Per Simona Huh, unless something changes with her health, she will not need home health services this visit. Per Simona Huh, they will provide transportation back to group home at discharge.    Expected Discharge Plan: Group Home(Community Group Home)    Expected Discharge Plan and Services Expected Discharge Plan: Group Home(Community Group Home)                                               Social Determinants of Health (SDOH) Interventions    Readmission Risk Interventions No flowsheet data found.

## 2019-03-22 NOTE — ED Notes (Signed)
ED TO INPATIENT HANDOFF REPORT  ED Nurse Name and Phone #:  Reuel BoomDaniel [phone]  S Name/Age/Gender Tammy [name] 58 y.o. female Room/Bed: ED19A/ED19A  Code Status   Code Status: Full Code  Home/SNF/Other Skilled nursing facility Patient oriented to: self, place, time and situation Is this baseline? Yes   Triage Complete: Triage complete  Chief Complaint seizure  Triage Note Arrives from group home post seizure x 2 today. Patient oriented to person place and month. Knows she had a seizure. MAE well.    Allergies Allergies  Allergen Reactions  . Penicillins Rash    Has patient had a PCN reaction causing immediate rash, facial/tongue/throat swelling, SOB or lightheadedness with hypotension: No Has patient had a PCN reaction causing severe rash involving mucus membranes or skin necrosis: No Has patient had a PCN reaction that required hospitalization: No Has patient had a PCN reaction occurring within the last 10 years: No If all of the above answers are "NO", then may proceed with Cephalosporin use.   . Prednisone Rash    Level of Care/Admitting Diagnosis ED Disposition    ED Disposition Condition Comment   Admit  [facility] [100120]  Level of Care: Med-Surg [16]  Covid Evaluation: N/A  Diagnosis: Seizure [facility](HCC) [205090]  Admitting Physician: Milagros LollSUDINI, SRIKAR [[id number]]  Attending Physician: Milagros LollSUDINI, SRIKAR [[id number]]  PT Class (Do Not Modify): Observation [104]  PT Acc Code (Do Not Modify): Observation [10022]       B Medical/Surgery History Past Medical History:  Diagnosis Date  . Anxiety   . Asthma   . Bipolar 1 disorder (HCC)   . CHF (congestive heart failure) (HCC)   . COPD (chronic obstructive pulmonary disease) (HCC)   . Dementia (HCC)   . Epilepsy (HCC)   . Seizures (HCC)    Past Surgical History:  Procedure Laterality Date  . APPENDECTOMY    . hernia reapir     x 3       A IV  Location/Drains/Wounds Patient Lines/Drains/Airways Status   Active Line/Drains/Airways    Name:   Placement date:   Placement time:   Site:   Days:   Peripheral IV 12/15/18 Left Forearm   12/15/18    1326    Forearm   97   Peripheral IV 03/22/19 Left Antecubital   03/22/19    1637    Antecubital   less than 1   External Urinary Catheter   10/26/18    1950    -   147          Intake/Output Last 24 hours No intake or output data in the 24 hours ending 03/22/19 1855  Labs/Imaging Results for orders placed or performed during the hospital encounter of 03/22/19 (from the past 48 hour(s))  CBC with Differential     Status: Abnormal   Collection Time: 03/22/19 12:49 PM  Result Value Ref Range   WBC 7.1 4.0 - 10.5 K/uL   RBC 4.38 3.87 - 5.11 MIL/uL   Hemoglobin 10.8 (L) 12.0 - 15.0 g/dL   HCT 52.836.7 41.336.0 - 24.446.0 %   MCV 83.8 80.0 - 100.0 fL   MCH 24.7 (L) 26.0 - 34.0 pg   MCHC 29.4 (L) 30.0 - 36.0 g/dL   RDW 01.017.3 (H) 27.211.5 - 53.615.5 %   Platelets 211 150 - 400 K/uL   nRBC 0.4 (H) 0.0 - 0.2 %   Neutrophils Relative % 64 %   Neutro Abs 4.6 1.7 -  7.7 K/uL   Lymphocytes Relative 25 %   Lymphs Abs 1.8 0.7 - 4.0 K/uL   Monocytes Relative 6 %   Monocytes Absolute 0.4 0.1 - 1.0 K/uL   Eosinophils Relative 3 %   Eosinophils Absolute 0.2 0.0 - 0.5 K/uL   Basophils Relative 1 %   Basophils Absolute 0.0 0.0 - 0.1 K/uL   Immature Granulocytes 1 %   Abs Immature Granulocytes 0.06 0.00 - 0.07 K/uL    Comment: Performed at [facility], [address] [place], [place] [address]  Comprehensive metabolic panel     Status: Abnormal   Collection Time: 03/22/19 12:49 PM  Result Value Ref Range   Sodium 137 135 - 145 mmol/L   Potassium 4.0 3.5 - 5.1 mmol/L   Chloride 103 98 - 111 mmol/L   CO2 25 22 - 32 mmol/L   Glucose, Bld 199 (H) 70 - 99 mg/dL   BUN 21 (H) 6 - 20 mg/dL   Creatinine, Ser 1.08 (H) 0.44 - 1.00 mg/dL   Calcium 9.2 8.9 - 10.3 mg/dL   Total Protein 6.3 (L) 6.5 - 8.1 g/dL    Albumin 3.7 3.5 - 5.0 g/dL   AST 23 15 - 41 U/L   ALT 17 0 - 44 U/L   Alkaline Phosphatase 90 38 - 126 U/L   Total Bilirubin 0.5 0.3 - 1.2 mg/dL   GFR calc non Af Amer 57 (L) >60 mL/min   GFR calc Af Amer >60 >60 mL/min   Anion gap 9 5 - 15    Comment: Performed at [facility], [address] [place], [place] [address]  Urinalysis, Complete w Microscopic     Status: Abnormal   Collection Time: 03/22/19 12:49 PM  Result Value Ref Range   Color, Urine YELLOW (A) YELLOW   APPearance HAZY (A) CLEAR   Specific Gravity, Urine 1.003 (L) 1.005 - 1.030   pH 6.0 5.0 - 8.0   Glucose, UA >=500 (A) NEGATIVE mg/dL   Hgb urine dipstick NEGATIVE NEGATIVE   Bilirubin Urine NEGATIVE NEGATIVE   Ketones, ur NEGATIVE NEGATIVE mg/dL   Protein, ur NEGATIVE NEGATIVE mg/dL   Nitrite POSITIVE (A) NEGATIVE   Leukocytes,Ua TRACE (A) NEGATIVE   RBC / HPF 0-5 0 - 5 RBC/hpf   WBC, UA 6-10 0 - 5 WBC/hpf   Bacteria, UA RARE (A) NONE SEEN   Squamous Epithelial / LPF 0-5 0 - 5    Comment: Performed at [facility], [place],  [address]  Troponin I - ONCE - STAT     Status: None   Collection Time: 03/22/19 12:49 PM  Result Value Ref Range   Troponin I <0.03 <0.03 ng/mL    Comment: Performed at [facility], [address] [place], [place] [address]  Glucose, capillary     Status: None   Collection Time: 03/22/19  2:42 PM  Result Value Ref Range   Glucose-Capillary 98 70 - 99 mg/dL  Glucose, capillary     Status: Abnormal   Collection Time: 03/22/19  4:49 PM  Result Value Ref Range   Glucose-Capillary 185 (H) 70 - 99 mg/dL   Ct Head Wo Contrast  Result Date: 03/22/2019 CLINICAL DATA:  Postictal. EXAM: CT HEAD WITHOUT CONTRAST TECHNIQUE: Contiguous axial images were obtained from the base of the skull through the vertex without intravenous contrast. COMPARISON:  12/15/2018 FINDINGS: Brain: No evidence of acute infarction, hemorrhage, hydrocephalus,  extra-axial collection or mass lesion/mass effect.  Vascular: No hyperdense vessel or unexpected calcification. Skull: Normal. Negative for fracture or focal lesion. Sinuses/Orbits: No acute finding. Other: None. IMPRESSION: No acute intracranial abnormality. Electronically Signed   By: Ted Mcalpineobrinka  Dimitrova M.D.   On: 03/22/2019 13:31    Pending Labs Unresulted Labs (From admission, onward)    Start     Ordered   03/29/19 0500  Creatinine, serum  (enoxaparin (LOVENOX)    CrCl >/= 30 ml/min)  Weekly,   STAT    Comments: while on enoxaparin therapy    03/22/19 1756   03/22/19 1803  Urine Culture  Add-on,   AD    Question:  Patient immune status  Answer:  Normal   03/22/19 1802   03/22/19 1759  Novel Coronavirus,NAA,(SEND-OUT TO REF LAB - TAT 24-48 hrs); Hosp Order  (Asymptomatic Patients Labs)  ONCE - STAT,   STAT    Question:  Rule Out  Answer:  Yes   03/22/19 1758          Vitals/Pain Today's Vitals   03/22/19 1246 03/22/19 1247  BP: 122/62   Pulse: 62   Resp: 18   Temp: 98.2 F (36.8 C)   TempSrc: Oral   SpO2: 100%   Weight:  81.6 kg  Height:  5\' 2"  (1.575 m)  PainSc:  8     Isolation Precautions No active isolations  Medications Medications  enoxaparin (LOVENOX) injection 40 mg (has no administration in time range)  acetaminophen (TYLENOL) tablet 650 mg (has no administration in time range)    Or  acetaminophen (TYLENOL) suppository 650 mg (has no administration in time range)  polyethylene glycol (MIRALAX / GLYCOLAX) packet 17 g (has no administration in time range)  ondansetron (ZOFRAN) tablet 4 mg (has no administration in time range)    Or  ondansetron (ZOFRAN) injection 4 mg (has no administration in time range)  albuterol (PROVENTIL) (2.5 MG/3ML) 0.083% nebulizer solution 2.5 mg (has no administration in time range)  cefTRIAXone (ROCEPHIN) 1 g in sodium chloride 0.9 % 100 mL IVPB (has no administration in time range)  levETIRAcetam (KEPPRA) tablet 500 mg (500  mg Oral Given 03/22/19 1610)  acetaminophen (TYLENOL) tablet 1,000 mg (1,000 mg Oral Given 03/22/19 1643)    Mobility walks Low fall risk   Focused Assessments Cardiac Assessment Handoff:    Lab Results  Component Value Date   TROPONINI <0.03 03/22/2019   No results found for: DDIMER Does the Patient currently have chest pain? No     R Recommendations: See Admitting Provider Note  Report given to:   Additional Notes:

## 2019-03-23 LAB — GLUCOSE, CAPILLARY
Glucose-Capillary: 262 mg/dL — ABNORMAL HIGH (ref 70–99)
Glucose-Capillary: 116 mg/dL — ABNORMAL HIGH (ref 70–99)
Glucose-Capillary: 129 mg/dL — ABNORMAL HIGH (ref 70–99)

## 2019-03-23 LAB — HEMOGLOBIN A1C
Hgb A1c MFr Bld: 6.7 % — ABNORMAL HIGH (ref 4.8–5.6)
Mean Plasma Glucose: 145.59 mg/dL

## 2019-03-23 LAB — MRSA PCR SCREENING: MRSA by PCR: NEGATIVE

## 2019-03-23 MED ORDER — LEVETIRACETAM 500 MG PO TABS
500.0000 mg | ORAL_TABLET | Freq: Two times a day (BID) | ORAL | Status: DC
  Administered 2019-03-23: 12:00:00 500 mg via ORAL
  Filled 2019-03-23 (×2): qty 1

## 2019-03-23 MED ORDER — CEPHALEXIN 500 MG PO CAPS: 500.0000 mg | ORAL_CAPSULE | Freq: Two times a day (BID) | ORAL | 0 refills | Status: AC

## 2019-03-23 MED ORDER — LEVETIRACETAM 500 MG PO TABS: 500.0000 mg | ORAL_TABLET | Freq: Two times a day (BID) | ORAL | 0 refills | Status: DC

## 2019-03-23 NOTE — NC FL2 (Signed)
Tammy [name] FL2 LEVEL OF CARE SCREENING TOOL     IDENTIFICATION  Patient Name: Tammy CyphersBrenda M [name] Birthdate: May 18, 1961 Sex: female Admission Date (Current Location): 03/22/2019  [place]ammy [name]   Facility and [name]:  Tammy [name], [address], Tammy [name], Tammy [name] [phone]      Provider Number: [id number]  Attending Physician Name and [name]:  Tammy [name], Pavan, MD  Relative Name and Phone Number:       Current Level of Care: Hospital Recommended Level of Care: Other (Comment)(Tammy [name]) Prior Approval Number:    Date Approved/Denied:   PASRR Number:    Discharge Plan: Other (Comment)(Tammy [name])    Current Diagnoses: Patient Active Problem List   Diagnosis Date Noted  . Seizure (HCC) 03/22/2019  . Normocytic anemia 03/18/2019  . Influenza A 11/10/2018  . Hyponatremia 10/26/2018  . Syncopal episodes 06/23/2017  . Acute on chronic respiratory failure with hypoxia (HCC) 03/15/2017  . COPD with acute exacerbation (HCC) 03/15/2017  . Acute on chronic respiratory failure (HCC) 03/15/2017    Orientation RESPIRATION BLADDER Height & Weight     Self  O2 Continent Weight: 179 lb 6.4 oz (81.4 kg) Height:  5\' 2"  (157.5 cm)  BEHAVIORAL SYMPTOMS/MOOD NEUROLOGICAL BOWEL NUTRITION STATUS  (none) (none) Continent Diet(Heart Healthy)  AMBULATORY STATUS COMMUNICATION OF NEEDS Skin   Supervision Verbally Normal                       Personal Care Assistance Level of Assistance  Bathing, Feeding, Dressing Bathing Assistance: Limited assistance Feeding assistance: Independent Dressing Assistance: Limited assistance     Functional Limitations Info  Sight, Speech, Hearing Sight Info: Adequate Hearing Info: Adequate Speech Info: Adequate    SPECIAL CARE FACTORS FREQUENCY                       Contractures Contractures Info: Not present    Additional Factors Info  Code Status, Allergies Code Status  Info: Full Code Allergies Info: Penicillins, Prednisone           TAKE these medications   acetaminophen 500 MG tablet Commonly known as: TYLENOL Take 500-1,000 mg by mouth daily as needed for mild pain, moderate pain or fever.   albuterol 108 (90 Base) MCG/ACT inhaler Commonly known as: VENTOLIN HFA Inhale 4-6 puffs by mouth every 4 hours as needed for wheezing, cough, and/or shortness of breath   busPIRone 30 MG tablet Commonly known as: BUSPAR Take 30 mg by mouth 2 (two) times daily.   carvedilol 3.125 MG tablet Commonly known as: COREG Take 3.125 mg by mouth 2 (two) times daily with a meal.   cephALEXin 500 MG capsule Commonly known as: KEFLEX Take 1 capsule (500 mg total) by mouth 2 (two) times daily for 4 days.   cetirizine 10 MG tablet Commonly known as: ZYRTEC Take 10 mg by mouth daily.   cyanocobalamin 1000 MCG/ML injection Commonly known as: (VITAMIN B-12) 1000 mcg IM Once a month   diphenhydramine-acetaminophen 25-500 MG Tabs tablet Commonly known as: TYLENOL PM Take 1 tablet by mouth at bedtime as needed (sleep/pain).   empagliflozin 10 MG Tabs tablet Commonly known as: JARDIANCE Take 10 mg by mouth daily.   ferrous sulfate 325 (65 FE) MG EC tablet Take 1 tablet (325 mg total) by mouth daily with breakfast.   Fish Oil 1000 MG Caps Take 1 capsule by mouth 2 (two) times daily.   fluticasone 50  MCG/ACT nasal spray Commonly known as: FLONASE Place 1 spray into both nostrils daily.   levETIRAcetam 500 MG tablet Commonly known as: KEPPRA Take 1 tablet (500 mg total) by mouth 2 (two) times daily for 30 days.   levothyroxine 100 MCG tablet Commonly known as: SYNTHROID Take 100 mcg by mouth daily before breakfast.   lithium carbonate 150 MG capsule Take 150 mg by mouth daily.   metFORMIN 500 MG tablet Commonly known as: GLUCOPHAGE Take 500 mg by mouth 2 (two) times daily with a meal.   pravastatin 40 MG tablet Commonly known as:  PRAVACHOL Take 40 mg by mouth daily.   risperiDONE 3 MG tablet Commonly known as: RISPERDAL Take 3 mg by mouth at bedtime.   rOPINIRole 1 MG tablet Commonly known as: REQUIP Take 1 mg by mouth at bedtime.   tamsulosin 0.4 MG Caps capsule Commonly known as: FLOMAX Take 1 capsule (0.4 mg total) by mouth daily.   Trelegy Ellipta 100-62.5-25 MCG/INH Aepb Generic drug: Fluticasone-Umeclidin-Vilant Inhale 1 puff into the lungs daily.   Vitamin D (Ergocalciferol) 1.25 MG (50000 UT) Caps capsule Commonly known as: DRISDOL Take 50,000 Units by mouth every 7 (seven) days.     Discharge Medications: Please see discharge summary for a list of discharge medications.  Relevant Imaging Results:  Relevant Lab Results:   Additional Information    Kiaan Overholser  Louretta Shorten, LCSWA

## 2019-03-23 NOTE — Discharge Summary (Signed)
[facility] NAME: Tammy [name]    MR#:  [id number]  DATE OF BIRTH:  Jul 28, 1961  DATE OF ADMISSION:  03/22/2019 ADMITTING PHYSICIAN: Hillary Bow, MD  DATE OF DISCHARGE: 03/23/2019  PRIMARY CARE PHYSICIAN: Remi Haggard, FNP   ADMISSION DIAGNOSIS:  Seizures ([place]) [R56.9]  DISCHARGE DIAGNOSIS:  Breakthrough seizure Urinary tract infection COPD Bipolar disorder  SECONDARY DIAGNOSIS:   Past Medical History:  Diagnosis Date  . Anxiety   . Asthma   . Bipolar 1 disorder ([place])   . CHF (congestive heart failure) ([place])   . COPD (chronic obstructive pulmonary disease) ([place])   . Dementia ([place])   . Epilepsy ([place])   . Seizures ([place])      ADMITTING HISTORY Tammy [name]  is a 58 y.o. female with a known history of epilepsy, dementia, bipolar disorder presents from local nursing home due to 2 episodes of seizures.  These were generalized tonic-clonic seizure per ED staff.  Patient has been seen by neurology and loaded with oral Keppra.  5 mg twice daily.  Admit for observation.  Patient is poor historian due to dementia.  No complaints of pain or shortness of breath.  Afebrile. CT scan of the head showed nothing acute  HOSPITAL COURSE:  Patient admitted to medical floor under observation bed.  Patient was seen by neurology for seizures.  Patient was started on oral Keppra 500 twice daily.  Patient received IV Rocephin antibiotic for UTI.  No new episodes of seizures.  Patient was worked up with CT head which showed no acute abnormality.  CONSULTS OBTAINED:  Treatment Team:  Neville Route, MD  DRUG ALLERGIES:   Allergies  Allergen Reactions  . Penicillins Rash    Has patient had a PCN reaction causing immediate rash, facial/tongue/throat swelling, SOB or lightheadedness with hypotension: No Has patient had a PCN reaction causing severe rash involving mucus membranes or skin necrosis: No Has patient had a PCN reaction  that required hospitalization: No Has patient had a PCN reaction occurring within the last 10 years: No If all of the above answers are "NO", then may proceed with Cephalosporin use.   . Prednisone Rash    DISCHARGE MEDICATIONS:   Allergies as of 03/23/2019      Reactions   Penicillins Rash   Has patient had a PCN reaction causing immediate rash, facial/tongue/throat swelling, SOB or lightheadedness with hypotension: No Has patient had a PCN reaction causing severe rash involving mucus membranes or skin necrosis: No Has patient had a PCN reaction that required hospitalization: No Has patient had a PCN reaction occurring within the last 10 years: No If all of the above answers are "NO", then may proceed with Cephalosporin use.   Prednisone Rash      Medication List    TAKE these medications   acetaminophen 500 MG tablet Commonly known as: TYLENOL Take 500-1,000 mg by mouth daily as needed for mild pain, moderate pain or fever.   albuterol 108 (90 Base) MCG/ACT inhaler Commonly known as: VENTOLIN HFA Inhale 4-6 puffs by mouth every 4 hours as needed for wheezing, cough, and/or shortness of breath   busPIRone 30 MG tablet Commonly known as: BUSPAR Take 30 mg by mouth 2 (two) times daily.   carvedilol 3.125 MG tablet Commonly known as: COREG Take 3.125 mg by mouth 2 (two) times daily with a meal.   cephALEXin 500 MG capsule Commonly known as: KEFLEX Take 1 capsule (500 mg total)  by mouth 2 (two) times daily for 4 days.   cetirizine 10 MG tablet Commonly known as: ZYRTEC Take 10 mg by mouth daily.   cyanocobalamin 1000 MCG/ML injection Commonly known as: (VITAMIN B-12) 1000 mcg IM Once a month   diphenhydramine-acetaminophen 25-500 MG Tabs tablet Commonly known as: TYLENOL PM Take 1 tablet by mouth at bedtime as needed (sleep/pain).   empagliflozin 10 MG Tabs tablet Commonly known as: JARDIANCE Take 10 mg by mouth daily.   ferrous sulfate 325 (65 FE) MG EC  tablet Take 1 tablet (325 mg total) by mouth daily with breakfast.   Fish Oil 1000 MG Caps Take 1 capsule by mouth 2 (two) times daily.   fluticasone 50 MCG/ACT nasal spray Commonly known as: FLONASE Place 1 spray into both nostrils daily.   levETIRAcetam 500 MG tablet Commonly known as: KEPPRA Take 1 tablet (500 mg total) by mouth 2 (two) times daily for 30 days.   levothyroxine 100 MCG tablet Commonly known as: SYNTHROID Take 100 mcg by mouth daily before breakfast.   lithium carbonate 150 MG capsule Take 150 mg by mouth daily.   metFORMIN 500 MG tablet Commonly known as: GLUCOPHAGE Take 500 mg by mouth 2 (two) times daily with a meal.   pravastatin 40 MG tablet Commonly known as: PRAVACHOL Take 40 mg by mouth daily.   risperiDONE 3 MG tablet Commonly known as: RISPERDAL Take 3 mg by mouth at bedtime.   rOPINIRole 1 MG tablet Commonly known as: REQUIP Take 1 mg by mouth at bedtime.   tamsulosin 0.4 MG Caps capsule Commonly known as: FLOMAX Take 1 capsule (0.4 mg total) by mouth daily.   Trelegy Ellipta 100-62.5-25 MCG/INH Aepb Generic drug: Fluticasone-Umeclidin-Vilant Inhale 1 puff into the lungs daily.   Vitamin D (Ergocalciferol) 1.25 MG (50000 UT) Caps capsule Commonly known as: DRISDOL Take 50,000 Units by mouth every 7 (seven) days.       Today  Patient seen and evaluated today No new episodes of seizures Tolerating diet well Hemodynamically stable  VITAL SIGNS:  Blood pressure 121/66, pulse 74, temperature 97.7 F (36.5 C), temperature source Oral, resp. rate 16, height 5\' 2"  (1.575 m), weight 81.4 kg, SpO2 94 %.  I/O:    Intake/Output Summary (Last 24 hours) at 03/23/2019 1006 Last data filed at 03/23/2019 0951 Gross per 24 hour  Intake 860 ml  Output -  Net 860 ml    PHYSICAL EXAMINATION:  Physical Exam  GENERAL:  58 y.o.-year-old patient lying in the bed with no acute distress.  LUNGS: Normal breath sounds bilaterally, no  wheezing, rales,rhonchi or crepitation. No use of accessory muscles of respiration.  CARDIOVASCULAR: S1, S2 normal. No murmurs, rubs, or gallops.  ABDOMEN: Soft, non-tender, non-distended. Bowel sounds present. No organomegaly or mass.  NEUROLOGIC: Moves all 4 extremities. PSYCHIATRIC: The patient is alert and oriented x 3.  SKIN: No obvious rash, lesion, or ulcer.   DATA REVIEW:   CBC Recent Labs  Lab 03/22/19 1249  WBC 7.1  HGB 10.8*  HCT 36.7  PLT 211    Chemistries  Recent Labs  Lab 03/22/19 1249  NA 137  K 4.0  CL 103  CO2 25  GLUCOSE 199*  BUN 21*  CREATININE 1.08*  CALCIUM 9.2  AST 23  ALT 17  ALKPHOS 90  BILITOT 0.5    Cardiac Enzymes Recent Labs  Lab 03/22/19 1249  TROPONINI <0.03    Microbiology Results  Results for orders placed or performed during the  hospital encounter of 03/22/19  SARS Coronavirus 2 (CEPHEID - Performed [facility]), Hosp Order     Status: None   Collection Time: 03/22/19  8:00 PM   Specimen: Nasopharyngeal Swab  Result Value Ref Range Status   SARS Coronavirus 2 NEGATIVE NEGATIVE Final    Comment: (NOTE) If result is NEGATIVE SARS-CoV-2 target nucleic acids are NOT DETECTED. The SARS-CoV-2 RNA is generally detectable in upper and lower  respiratory specimens during the acute phase of infection. The lowest  concentration of SARS-CoV-2 viral copies this assay can detect is 250  copies / mL. A negative result does not preclude SARS-CoV-2 infection  and should not be used as the sole basis for treatment or other  patient management decisions.  A negative result may occur with  improper specimen collection / handling, submission of specimen other  than nasopharyngeal swab, presence of viral mutation(s) within the  areas targeted by this assay, and inadequate number of viral copies  (<250 copies / mL). A negative result must be combined with clinical  observations, patient history, and epidemiological  information. If result is POSITIVE SARS-CoV-2 target nucleic acids are DETECTED. The SARS-CoV-2 RNA is generally detectable in upper and lower  respiratory specimens dur ing the acute phase of infection.  Positive  results are indicative of active infection with SARS-CoV-2.  Clinical  correlation with patient history and other diagnostic information is  necessary to determine patient infection status.  Positive results do  not rule out bacterial infection or co-infection with other viruses. If result is PRESUMPTIVE POSTIVE SARS-CoV-2 nucleic acids MAY BE PRESENT.   A presumptive positive result was obtained on the submitted specimen  and confirmed on repeat testing.  While 2019 novel coronavirus  (SARS-CoV-2) nucleic acids may be present in the submitted sample  additional confirmatory testing may be necessary for epidemiological  and / or clinical management purposes  to differentiate between  SARS-CoV-2 and other Sarbecovirus currently known to infect humans.  If clinically indicated additional testing with an alternate test  methodology [phone]([id number]) is advised. The SARS-CoV-2 RNA is generally  detectable in upper and lower respiratory sp ecimens during the acute  phase of infection. The expected result is Negative. Fact Sheet for Patients:  BoilerBrush.com.cyhttps://www.fda.gov/media/136312/download Fact Sheet for Healthcare Providers: https://pope.com/https://www.fda.gov/media/136313/download This test is not yet approved or cleared by the [place] FDA and has been authorized for detection and/or diagnosis of SARS-CoV-2 by FDA under an Emergency Use Authorization (EUA).  This EUA will remain in effect (meaning this test can be used) for the duration of the COVID-19 declaration under Section 564(b)(1) of the Act, 21 U.S.C. section 360bbb-3(b)(1), unless the authorization is terminated or revoked sooner. Performed at [facility], [address] [place], [place] [address]     RADIOLOGY:  Ct Head  Wo Contrast  Result Date: 03/22/2019 CLINICAL DATA:  Postictal. EXAM: CT HEAD WITHOUT CONTRAST TECHNIQUE: Contiguous axial images were obtained from the base of the skull through the vertex without intravenous contrast. COMPARISON:  12/15/2018 FINDINGS: Brain: No evidence of acute infarction, hemorrhage, hydrocephalus, extra-axial collection or mass lesion/mass effect. Vascular: No hyperdense vessel or unexpected calcification. Skull: Normal. Negative for fracture or focal lesion. Sinuses/Orbits: No acute finding. Other: None. IMPRESSION: No acute intracranial abnormality. Electronically Signed   By: Ted Mcalpineobrinka  Dimitrova M.D.   On: 03/22/2019 13:31    Follow up with PCP in 1 week.  Management plans discussed with the patient, family and they are in agreement.  CODE STATUS:  Full code    Code Status Orders  (From admission, onward)         Start     Ordered   03/22/19 1756  Full code  Continuous     03/22/19 1756        Code Status History    Date Active Date Inactive Code Status Order ID Comments User Context   11/10/2018 2024 11/12/2018 1618 Full Code [id number]  Milagros LollSudini, Srikar, MD ED   10/26/2018 1243 10/30/2018 1703 Full Code [id number]  Ramonita LabGouru, Aruna, MD Inpatient   06/23/2017 1944 06/25/2017 1640 Full Code [id number]  Altamese DillingVachhani, Vaibhavkumar, MD Inpatient   03/15/2017 1745 03/17/2017 2024 Full Code [id number]  Altamese DillingVachhani, Vaibhavkumar, MD Inpatient   Advance Care Planning Activity      TOTAL TIME TAKING CARE OF THIS PATIENT ON DAY OF DISCHARGE: more than 35 minutes.   Ihor AustinPavan  M.D on 03/23/2019 at 10:06 AM  Between 7am to 6pm - Pager - [phone]  After 6pm go to [email or web address] - password [email or web address] [facility]  [employer]  Hospitalists  Office  [phone]  CC: Primary care physician; Armando GangLindley, Cheryl P, FNP  Note: This dictation was prepared with Dragon dictation along with smaller phrase technology. Any transcriptional errors that result from this process are unintentional.

## 2019-03-23 NOTE — TOC Transition Note (Signed)
Transition of Care [facility]) - CM/SW Discharge Note   Patient Details  Name: Tammy [name] MRN: [id number] Date of Birth: 1961-04-16  [facility] CM/SW Contact:  Annamaria Boots, [place] Phone Number: 03/23/2019, 2:02 PM   Clinical Narrative:  Patient is medically ready for discharge today back to group home. CSW notified patient of discharge. CSW also contacted Simona Huh from [facility] group home and he states that patient can return and he will transport. RN to call when patient is ready for transport.      Final next level of care: Group Home Barriers to Discharge: No Barriers Identified   Patient Goals and CMS Choice   CMS Medicare.gov Compare Post Acute Care list provided to:: Patient Represenative (must comment)([facility] with [facility])    Discharge Placement                    Patient and family notified of of transfer: 03/23/19  Discharge Plan and Services                                     Social Determinants of Health (SDOH) Interventions     Readmission Risk Interventions No flowsheet data found.

## 2019-03-23 NOTE — Progress Notes (Signed)
   03/23/19 1100  Clinical Encounter Type  Visited With Patient  Visit Type Initial  Referral From Nurse  Spiritual Encounters  Spiritual Needs Prayer   Chaplain received an OR to provide support to the patient. Upon arrival, she was sitting up in bed watching television. She indicated that she is feeling, "some better" since being admitted for seizures. The patient presents with the concern that she would like to go home soon, and anticipates that being tomorrow at the earliest. Chaplain engaged the patient in conversation about her family and support system. The patient acknowledges being a person of faith who depends upon her relationship with God to manage the uncertainties brought on by life. The patient expressed interest in prayer, which the chaplain offered for her health, family and friends, and the state of the world.

## 2019-03-25 LAB — URINE CULTURE
Culture: >=100000 — AB
Special Requests: NORMAL

## 2019-04-07 DIAGNOSIS — Z87898 Personal history of other specified conditions: Secondary | ICD-10-CM | POA: Insufficient documentation

## 2019-05-04 ENCOUNTER — Other Ambulatory Visit: Payer: Self-pay

## 2019-05-04 ENCOUNTER — Ambulatory Visit (INDEPENDENT_AMBULATORY_CARE_PROVIDER_SITE_OTHER): Payer: Medicare Other | Admitting: Gastroenterology

## 2019-05-04 ENCOUNTER — Encounter: Payer: Self-pay | Admitting: Gastroenterology

## 2019-05-04 DIAGNOSIS — R569 Unspecified convulsions: Secondary | ICD-10-CM

## 2019-06-02 ENCOUNTER — Encounter: Payer: Self-pay | Admitting: Gastroenterology

## 2019-06-10 ENCOUNTER — Ambulatory Visit (INDEPENDENT_AMBULATORY_CARE_PROVIDER_SITE_OTHER): Payer: Medicare Other | Admitting: Podiatry

## 2019-06-10 ENCOUNTER — Other Ambulatory Visit: Payer: Self-pay

## 2019-06-10 ENCOUNTER — Encounter: Payer: Self-pay | Admitting: Podiatry

## 2019-06-10 DIAGNOSIS — B351 Tinea unguium: Secondary | ICD-10-CM | POA: Diagnosis not present

## 2019-06-10 DIAGNOSIS — M79676 Pain in unspecified toe(s): Secondary | ICD-10-CM

## 2019-06-10 DIAGNOSIS — E119 Type 2 diabetes mellitus without complications: Secondary | ICD-10-CM

## 2019-06-10 NOTE — Progress Notes (Signed)
Complaint:  Visit Type: Patient returns to my office for continued preventative foot care services. Complaint: Patient states" my nails have grown long and thick and become painful to walk and wear shoes" Patient has been diagnosed with DM and now takes metformin.. The patient presents for preventative foot care services. No changes to ROS  Podiatric Exam: Vascular: dorsalis pedis and posterior tibial pulses are palpable bilateral. Capillary return is immediate. Temperature gradient is WNL. Skin turgor WNL  Sensorium: Normal Semmes Weinstein monofilament test. Normal tactile sensation bilaterally. Nail Exam: Pt has thick disfigured discolored nails with subungual debris noted bilateral entire nail hallux through fifth toenails Ulcer Exam: There is no evidence of ulcer or pre-ulcerative changes or infection. Orthopedic Exam: Muscle tone and strength are WNL. No limitations in general ROM. No crepitus or effusions noted. Foot type and digits show no abnormalities. HAV  B/L. Dorsal  DJD  B/L Skin: No Porokeratosis. No infection or ulcers  Diagnosis:  Onychomycosis, , Pain in right toe, pain in left toes  Treatment & Plan Procedures and Treatment: Consent by patient was obtained for treatment procedures.   Debridement of mycotic and hypertrophic toenails, 1 through 5 bilateral and clearing of subungual debris. No ulceration, no infection noted.  Return Visit-Office Procedure: Patient instructed to return to the office for a follow up visit 3 months for continued evaluation and treatment.    Abdinasir Spadafore DPM 

## 2019-06-16 ENCOUNTER — Other Ambulatory Visit: Payer: Self-pay | Admitting: Internal Medicine

## 2019-06-18 ENCOUNTER — Inpatient Hospital Stay: Payer: Medicare Other

## 2019-06-18 ENCOUNTER — Inpatient Hospital Stay: Payer: Medicare Other | Admitting: Internal Medicine

## 2019-06-21 ENCOUNTER — Other Ambulatory Visit: Payer: Self-pay | Admitting: Internal Medicine

## 2019-06-21 DIAGNOSIS — E611 Iron deficiency: Secondary | ICD-10-CM | POA: Insufficient documentation

## 2019-06-22 ENCOUNTER — Encounter: Payer: Self-pay | Admitting: Internal Medicine

## 2019-06-22 ENCOUNTER — Other Ambulatory Visit: Payer: Self-pay

## 2019-06-22 NOTE — Progress Notes (Signed)
Patient pre screened for office appointment, no questions or concerns today. Patient unable to talk on phone group home administrator provided answers.

## 2019-06-23 ENCOUNTER — Other Ambulatory Visit: Payer: Self-pay

## 2019-06-23 ENCOUNTER — Inpatient Hospital Stay: Payer: Medicare Other | Attending: Internal Medicine

## 2019-06-23 ENCOUNTER — Inpatient Hospital Stay: Payer: Medicare Other

## 2019-06-23 ENCOUNTER — Encounter: Payer: Self-pay | Admitting: Internal Medicine

## 2019-06-23 ENCOUNTER — Inpatient Hospital Stay (HOSPITAL_BASED_OUTPATIENT_CLINIC_OR_DEPARTMENT_OTHER): Payer: Medicare Other | Admitting: Internal Medicine

## 2019-06-23 DIAGNOSIS — J449 Chronic obstructive pulmonary disease, unspecified: Secondary | ICD-10-CM | POA: Diagnosis not present

## 2019-06-23 DIAGNOSIS — I5030 Unspecified diastolic (congestive) heart failure: Secondary | ICD-10-CM | POA: Insufficient documentation

## 2019-06-23 DIAGNOSIS — D649 Anemia, unspecified: Secondary | ICD-10-CM

## 2019-06-23 DIAGNOSIS — D509 Iron deficiency anemia, unspecified: Secondary | ICD-10-CM | POA: Insufficient documentation

## 2019-06-23 DIAGNOSIS — E538 Deficiency of other specified B group vitamins: Secondary | ICD-10-CM | POA: Insufficient documentation

## 2019-06-23 DIAGNOSIS — F319 Bipolar disorder, unspecified: Secondary | ICD-10-CM | POA: Diagnosis not present

## 2019-06-23 LAB — URINALYSIS, COMPLETE (UACMP) WITH MICROSCOPIC
Bilirubin Urine: NEGATIVE
Glucose, UA: >=500 mg/dL — AB
Hgb urine dipstick: NEGATIVE
Ketones, ur: NEGATIVE mg/dL
Nitrite: POSITIVE — AB
Protein, ur: NEGATIVE mg/dL
Specific Gravity, Urine: 1.006 (ref 1.005–1.030)
WBC, UA: >50 WBC/hpf — ABNORMAL HIGH (ref 0–5)
pH: 6 (ref 5.0–8.0)

## 2019-06-23 NOTE — Assessment & Plan Note (Addendum)
#   Anemia- iron def- hb 10.4-currently on p.o. iron; in [place] hb-13; Iron sat- 83%.  Reviewed the records from [facility] from July.  Patient thought to be high risk for any endoscopic procedures given the risk of seizures.  #Unable to draw labs today because of poor IV access.  Recommend hydration and follow-up in 1 week for possible iron infusion labs.  # Hx of B12 deficiency-question B12 supplementation.  # Multiple medical problems: COPD/Diastolic heart failure/ Bipolar disorder- STABLE.   # I spoke at length with the patient's caregiver Onalee Hua- regarding the patient's clinical status/plan of care.  Caregiver in agreement.   # DISPOISTION # NO ferrahem today # 1 week- lab; possible ferahem/B12 injection-  # follow up in 3 months- MD;  Lab-cbc/cmp; possible ferahem/B12 injection-  Dr.B

## 2019-06-23 NOTE — Progress Notes (Signed)
[facility] CONSULT NOTE  Patient Care Team: Armando Gang, FNP as PCP - General (Family Medicine)  CHIEF COMPLAINTS/PURPOSE OF CONSULTATION:    HEMATOLOGY HISTORY  #Normocytic ANEMIA [?  Colonoscopy [facility] 5 years ago]; July 2020- [facility] evaluation-high risk for colonoscopy; hemoglobin 13/iron studies adequate  HISTORY OF PRESENTING ILLNESS: Spoke to caregiver,  Tammy [name] 58 y.o.  female with history of multiple medical problems including bipolar disorder, chronic diastolic heart failure/COPD is currently a group home resident.  Patient is a poor historian.    Patient denies any blood in stools no black or stools but denies any chest pain or shortness of breath or cough.  Review of Systems  Unable to perform ROS: Psychiatric disorder    MEDICAL HISTORY:  Past Medical History:  Diagnosis Date  . Anxiety   . Asthma   . Bipolar 1 disorder (HCC)   . CHF (congestive heart failure) (HCC)   . COPD (chronic obstructive pulmonary disease) (HCC)   . Dementia (HCC)   . Epilepsy (HCC)   . Seizures (HCC)     SURGICAL HISTORY: Past Surgical History:  Procedure Laterality Date  . APPENDECTOMY    . hernia reapir     x 3      SOCIAL HISTORY: Social History   Socioeconomic History  . Marital status: Widowed    Spouse name: Not on file  . Number of children: Not on file  . Years of education: Not on file  . Highest education level: Not on file  Occupational History  . Not on file  Social Needs  . Financial resource strain: Not hard at all  . Food insecurity    Worry: Never true    Inability: Never true  . Transportation needs    Medical: No    Non-medical: No  Tobacco Use  . Smoking status: Former Smoker    Quit date: 10/07/1998    Years since quitting: 20.7  . Smokeless tobacco: Never Used  Substance and Sexual Activity  . Alcohol use: No  . Drug use: No  . Sexual activity: Never  Lifestyle  . Physical activity    Days per week: 0  days    Minutes per session: Patient refused  . Stress: Not at all  Relationships  . Social Musician on phone: Not on file    Gets together: Not on file    Attends religious service: Not on file    Active member of club or organization: Not on file    Attends meetings of clubs or organizations: Not on file    Relationship status: Not on file  . Intimate partner violence    Fear of current or ex partner: Not on file    Emotionally abused: Not on file    Physically abused: Not on file    Forced sexual activity: Not on file  Other Topics Concern  . Not on file  Social History Narrative  . Not on file    FAMILY HISTORY: Family History  Problem Relation Age of Onset  . Hypertension Mother   . Kidney cancer Neg Hx   . Bladder Cancer Neg Hx     ALLERGIES:  is allergic to penicillins and prednisone.  MEDICATIONS:  Current Outpatient Medications  Medication Sig Dispense Refill  . acetaminophen (TYLENOL) 500 MG tablet Take 500-1,000 mg by mouth daily as needed for mild pain, moderate pain or fever.     Marland Kitchen albuterol (PROVENTIL HFA;VENTOLIN HFA)  108 (90 BASE) MCG/ACT inhaler Inhale 4-6 puffs by mouth every 4 hours as needed for wheezing, cough, and/or shortness of breath 1 Inhaler 1  . busPIRone (BUSPAR) 30 MG tablet Take 30 mg by mouth 2 (two) times daily.     . carvedilol (COREG) 3.125 MG tablet Take 3.125 mg by mouth 2 (two) times daily with a meal.     . cetirizine (ZYRTEC) 10 MG tablet Take 10 mg by mouth daily.    . cyanocobalamin (,VITAMIN B-12,) 1000 MCG/ML injection 1000 mcg IM Once a month 10 mL 1  . diphenhydramine-acetaminophen (TYLENOL PM) 25-500 MG TABS tablet Take 1 tablet by mouth at bedtime as needed (sleep/pain).     Marland Kitchen empagliflozin (JARDIANCE) 10 MG TABS tablet Take 10 mg by mouth daily.     . ferrous sulfate 325 (65 FE) MG EC tablet Take 1 tablet (325 mg total) by mouth daily with breakfast. 30 tablet 3  . fluticasone (FLONASE) 50 MCG/ACT nasal spray  Place 1 spray into both nostrils daily.     Derrill Memo ON 06/24/2019] lamoTRIgine (LAMICTAL) 25 MG tablet Wk1:1tab night wk2:1tab 2xday wk3:1tab AM 2tabsPM wk4:2tabs 2xday wk5:2tabs AM 3tabs PM wk6:3tabs 2xday wk7:3tabs AM 4tabs PM, wk8:4tab2xday wk9: 4tabAM and 5tabPM wk10:5tab2xday    . levETIRAcetam (KEPPRA) 500 MG tablet Take 1 tablet (500 mg total) by mouth 2 (two) times daily for 30 days. 60 tablet 0  . levothyroxine (SYNTHROID, LEVOTHROID) 100 MCG tablet Take 100 mcg by mouth daily before breakfast.     . lisinopril (ZESTRIL) 2.5 MG tablet     . lithium carbonate 150 MG capsule Take 150 mg by mouth daily.     . metFORMIN (GLUCOPHAGE) 500 MG tablet Take 500 mg by mouth 2 (two) times daily with a meal.    . Omega-3 Fatty Acids (FISH OIL) 1000 MG CAPS Take 1 capsule by mouth 2 (two) times daily.    . pravastatin (PRAVACHOL) 40 MG tablet Take 40 mg by mouth daily.    . risperiDONE (RISPERDAL) 3 MG tablet Take 3 mg by mouth at bedtime.     Marland Kitchen rOPINIRole (REQUIP) 1 MG tablet Take 1 mg by mouth at bedtime.    . tamsulosin (FLOMAX) 0.4 MG CAPS capsule Take 1 capsule (0.4 mg total) by mouth daily. 90 capsule 3  . TRELEGY ELLIPTA 100-62.5-25 MCG/INH AEPB Inhale 1 puff into the lungs daily.     . Vitamin D, Ergocalciferol, (DRISDOL) 1.25 MG (50000 UT) CAPS capsule Take 50,000 Units by mouth every 7 (seven) days.     No current facility-administered medications for this visit.       PHYSICAL EXAMINATION:   Vitals:   06/23/19 1121  BP: (!) 101/59  Pulse: (!) 56  Temp: (!) 97.5 F (36.4 C)   Filed Weights   06/23/19 1121  Weight: 178 lb (80.7 kg)    Physical Exam  Constitutional: She is oriented to person, place, and time and well-developed, well-nourished, and in no distress.  O2 nasal cannula.  Alone.  HENT:  Head: Normocephalic and atraumatic.  Mouth/Throat: Oropharynx is clear and moist. No oropharyngeal exudate.  Eyes: Pupils are equal, round, and reactive to light.  Neck: Normal  range of motion. Neck supple.  Cardiovascular: Normal rate and regular rhythm.  Pulmonary/Chest: No respiratory distress. She has no wheezes.  Abdominal: Soft. Bowel sounds are normal. She exhibits no distension and no mass. There is no abdominal tenderness. There is no rebound and no guarding.  Musculoskeletal: Normal range  of motion.        General: No tenderness or edema.  Neurological: She is alert and oriented to person, place, and time.  Skin: Skin is warm.  Psychiatric:  Flat affect.    LABORATORY DATA:  I have reviewed the data as listed Lab Results  Component Value Date   WBC 7.1 03/22/2019   HGB 10.8 (L) 03/22/2019   HCT 36.7 03/22/2019   MCV 83.8 03/22/2019   PLT 211 03/22/2019   Recent Labs    10/27/18 0233 10/28/18 0331  11/11/18 0420 11/11/18 1113 12/15/18 1314 03/22/19 1249  NA 131* 138   < > 137  --  133* 137  K 4.6 5.2*   < > 5.9* 5.3* 4.8 4.0  CL 102 106   < > 110  --  100 103  CO2 22 27   < > 21*  --  21* 25  GLUCOSE 165* 118*   < > 212*  --  127* 199*  BUN 12 16   < > 17  --  19 21*  CREATININE 1.25* 1.09*   < > 1.25*  --  1.07* 1.08*  CALCIUM 8.3* 9.2   < > 9.2  --  9.0 9.2  GFRNONAA 48* 56*   < > 48*  --  58* 57*  GFRAA 55* >60   < > 55*  --  >60 >60  PROT 5.7* 6.6  --   --   --   --  6.3*  ALBUMIN 3.4* 3.7  --   --   --   --  3.7  AST 32 23  --   --   --   --  23  ALT 14 15  --   --   --   --  17  ALKPHOS 89 77  --   --   --   --  90  BILITOT 0.5 0.6  --   --   --   --  0.5   < > = values in this interval not displayed.     No results found.  Normocytic anemia # Anemia- iron def- hb 10.4-currently on p.o. iron; in [place]2020- [[facility]] hb-13; Iron sat- 83%.  [facility]  Patient thought to be high risk for any endoscopic procedures given the risk of seizures.  #Unable to draw labs today because of poor IV access.  Recommend hydration and follow-up in 1 week for possible iron infusion labs.  # Hx of B12  deficiency-question B12 supplementation.  # Multiple medical problems: COPD/Diastolic heart failure/ Bipolar disorder- STABLE.   # I spoke at length with the patient's caregiver Jerrye BeaversHazel- regarding the patient's clinical status/plan of care.  Caregiver in agreement.   # DISPOISTION # NO ferrahem today # 1 week- lab; possible ferahem/B12 injection-  # follow up in 3 months- MD;  Lab-cbc/cmp; possible ferahem/B12 injection-  Dr.B    All questions were answered. The patient knows to call the clinic with any problems, questions or concerns.      Earna CoderGovinda R Brahmanday, MD 06/23/2019 2:50 PM

## 2019-06-29 ENCOUNTER — Other Ambulatory Visit: Payer: Self-pay

## 2019-06-30 ENCOUNTER — Other Ambulatory Visit: Payer: Self-pay

## 2019-06-30 ENCOUNTER — Inpatient Hospital Stay: Payer: Medicare Other

## 2019-06-30 DIAGNOSIS — D649 Anemia, unspecified: Secondary | ICD-10-CM

## 2019-06-30 DIAGNOSIS — D509 Iron deficiency anemia, unspecified: Secondary | ICD-10-CM | POA: Diagnosis not present

## 2019-06-30 LAB — COMPREHENSIVE METABOLIC PANEL
ALT: 16 U/L (ref 0–44)
AST: 22 U/L (ref 15–41)
Albumin: 3.9 g/dL (ref 3.5–5.0)
Alkaline Phosphatase: 94 U/L (ref 38–126)
Anion gap: 10 (ref 5–15)
BUN: 15 mg/dL (ref 6–20)
CO2: 24 mmol/L (ref 22–32)
Calcium: 9.6 mg/dL (ref 8.9–10.3)
Chloride: 104 mmol/L (ref 98–111)
Creatinine, Ser: 1.1 mg/dL — ABNORMAL HIGH (ref 0.44–1.00)
GFR calc Af Amer: >60 mL/min (ref >60)
GFR calc non Af Amer: 55 mL/min — ABNORMAL LOW (ref >60)
Glucose, Bld: 97 mg/dL (ref 70–99)
Potassium: 4.5 mmol/L (ref 3.5–5.1)
Sodium: 138 mmol/L (ref 135–145)
Total Bilirubin: 0.5 mg/dL (ref 0.3–1.2)
Total Protein: 6.8 g/dL (ref 6.5–8.1)

## 2019-06-30 LAB — CBC WITH DIFFERENTIAL/PLATELET
Abs Immature Granulocytes: 0.02 10*3/uL (ref 0.00–0.07)
Basophils Absolute: 0 10*3/uL (ref 0.0–0.1)
Basophils Relative: 1 %
Eosinophils Absolute: 0.2 10*3/uL (ref 0.0–0.5)
Eosinophils Relative: 3 %
HCT: 42 % (ref 36.0–46.0)
Hemoglobin: 13.2 g/dL (ref 12.0–15.0)
Immature Granulocytes: 0 %
Lymphocytes Relative: 20 %
Lymphs Abs: 1.1 10*3/uL (ref 0.7–4.0)
MCH: 29.3 pg (ref 26.0–34.0)
MCHC: 31.4 g/dL (ref 30.0–36.0)
MCV: 93.1 fL (ref 80.0–100.0)
Monocytes Absolute: 0.3 10*3/uL (ref 0.1–1.0)
Monocytes Relative: 6 %
Neutro Abs: 3.6 10*3/uL (ref 1.7–7.7)
Neutrophils Relative %: 70 %
Platelets: 157 10*3/uL (ref 150–400)
RBC: 4.51 MIL/uL (ref 3.87–5.11)
RDW: 15.2 % (ref 11.5–15.5)
WBC: 5.2 10*3/uL (ref 4.0–10.5)
nRBC: 0 % (ref 0.0–0.2)

## 2019-06-30 LAB — IRON AND TIBC
Iron: 65 ug/dL (ref 28–170)
Saturation Ratios: 19 % (ref 10.4–31.8)
TIBC: 347 ug/dL (ref 250–450)
UIBC: 282 ug/dL

## 2019-06-30 LAB — VITAMIN B12: Vitamin B-12: 176 pg/mL — ABNORMAL LOW (ref 180–914)

## 2019-06-30 LAB — FOLATE: Folate: 15.5 ng/mL (ref >5.9)

## 2019-06-30 LAB — LACTATE DEHYDROGENASE: LDH: 134 U/L (ref 98–192)

## 2019-06-30 LAB — FERRITIN: Ferritin: 31 ng/mL (ref 11–307)

## 2019-07-01 ENCOUNTER — Other Ambulatory Visit: Payer: Self-pay | Admitting: Internal Medicine

## 2019-07-01 ENCOUNTER — Other Ambulatory Visit: Payer: Self-pay

## 2019-07-01 LAB — KAPPA/LAMBDA LIGHT CHAINS
Kappa free light chain: 49.4 mg/L — ABNORMAL HIGH (ref 3.3–19.4)
Kappa, lambda light chain ratio: 1.57 (ref 0.26–1.65)
Lambda free light chains: 31.4 mg/L — ABNORMAL HIGH (ref 5.7–26.3)

## 2019-07-01 MED ORDER — CYANOCOBALAMIN 1000 MCG SL SUBL: 1.0000 | SUBLINGUAL_TABLET | Freq: Every day | SUBLINGUAL | 0 refills | Status: AC

## 2019-07-01 NOTE — Progress Notes (Signed)
Spoke to [place] at [facility] and pharmacy does not accept electronic rx's - so I gave verbal order for rx.  Thanks

## 2019-07-02 LAB — MULTIPLE MYELOMA PANEL, SERUM
Albumin SerPl Elph-Mcnc: 3.7 g/dL (ref 2.9–4.4)
Albumin/Glob SerPl: 1.4 (ref 0.7–1.7)
Alpha 1: 0.3 g/dL (ref 0.0–0.4)
Alpha2 Glob SerPl Elph-Mcnc: 0.8 g/dL (ref 0.4–1.0)
B-Globulin SerPl Elph-Mcnc: 0.9 g/dL (ref 0.7–1.3)
Gamma Glob SerPl Elph-Mcnc: 0.6 g/dL (ref 0.4–1.8)
Globulin, Total: 2.7 g/dL (ref 2.2–3.9)
IgA: 122 mg/dL (ref 87–352)
IgG (Immunoglobin G), Serum: 715 mg/dL (ref 586–1602)
IgM (Immunoglobulin M), Srm: 45 mg/dL (ref 26–217)
Total Protein ELP: 6.4 g/dL (ref 6.0–8.5)

## 2019-07-25 ENCOUNTER — Other Ambulatory Visit: Payer: Self-pay

## 2019-07-25 ENCOUNTER — Emergency Department
Admission: EM | Admit: 2019-07-25 | Discharge: 2019-07-25 | Disposition: A | Discharge status: Home / Self Care | Payer: Medicare Other | Attending: Emergency Medicine | Admitting: Emergency Medicine

## 2019-07-25 ENCOUNTER — Emergency Department: Payer: Medicare Other

## 2019-07-25 DIAGNOSIS — Z79899 Other long term (current) drug therapy: Secondary | ICD-10-CM | POA: Insufficient documentation

## 2019-07-25 DIAGNOSIS — Z87891 Personal history of nicotine dependence: Secondary | ICD-10-CM | POA: Insufficient documentation

## 2019-07-25 DIAGNOSIS — R197 Diarrhea, unspecified: Secondary | ICD-10-CM | POA: Insufficient documentation

## 2019-07-25 DIAGNOSIS — R112 Nausea with vomiting, unspecified: Secondary | ICD-10-CM | POA: Insufficient documentation

## 2019-07-25 DIAGNOSIS — Z20828 Contact with and (suspected) exposure to other viral communicable diseases: Secondary | ICD-10-CM | POA: Diagnosis not present

## 2019-07-25 DIAGNOSIS — I4729 Other ventricular tachycardia: Secondary | ICD-10-CM

## 2019-07-25 DIAGNOSIS — I472 Ventricular tachycardia: Secondary | ICD-10-CM | POA: Diagnosis not present

## 2019-07-25 DIAGNOSIS — Z7984 Long term (current) use of oral hypoglycemic drugs: Secondary | ICD-10-CM | POA: Insufficient documentation

## 2019-07-25 DIAGNOSIS — N3000 Acute cystitis without hematuria: Secondary | ICD-10-CM | POA: Diagnosis not present

## 2019-07-25 DIAGNOSIS — J45909 Unspecified asthma, uncomplicated: Secondary | ICD-10-CM | POA: Insufficient documentation

## 2019-07-25 DIAGNOSIS — F0390 Unspecified dementia without behavioral disturbance: Secondary | ICD-10-CM | POA: Insufficient documentation

## 2019-07-25 DIAGNOSIS — J449 Chronic obstructive pulmonary disease, unspecified: Secondary | ICD-10-CM | POA: Diagnosis not present

## 2019-07-25 LAB — CBC WITH DIFFERENTIAL/PLATELET
Abs Immature Granulocytes: 0.02 10*3/uL (ref 0.00–0.07)
Basophils Absolute: 0 10*3/uL (ref 0.0–0.1)
Basophils Relative: 1 %
Eosinophils Absolute: 0.1 10*3/uL (ref 0.0–0.5)
Eosinophils Relative: 2 %
HCT: 44.5 % (ref 36.0–46.0)
Hemoglobin: 13.9 g/dL (ref 12.0–15.0)
Immature Granulocytes: 0 %
Lymphocytes Relative: 20 %
Lymphs Abs: 1.2 10*3/uL (ref 0.7–4.0)
MCH: 29.4 pg (ref 26.0–34.0)
MCHC: 31.2 g/dL (ref 30.0–36.0)
MCV: 94.3 fL (ref 80.0–100.0)
Monocytes Absolute: 0.4 10*3/uL (ref 0.1–1.0)
Monocytes Relative: 7 %
Neutro Abs: 4 10*3/uL (ref 1.7–7.7)
Neutrophils Relative %: 70 %
Platelets: 180 10*3/uL (ref 150–400)
RBC: 4.72 MIL/uL (ref 3.87–5.11)
RDW: 14.2 % (ref 11.5–15.5)
WBC: 5.8 10*3/uL (ref 4.0–10.5)
nRBC: 0 % (ref 0.0–0.2)

## 2019-07-25 LAB — COMPREHENSIVE METABOLIC PANEL
ALT: 15 U/L (ref 0–44)
AST: 20 U/L (ref 15–41)
Albumin: 4 g/dL (ref 3.5–5.0)
Alkaline Phosphatase: 93 U/L (ref 38–126)
Anion gap: 8 (ref 5–15)
BUN: 15 mg/dL (ref 6–20)
CO2: 27 mmol/L (ref 22–32)
Calcium: 9.7 mg/dL (ref 8.9–10.3)
Chloride: 106 mmol/L (ref 98–111)
Creatinine, Ser: 1 mg/dL (ref 0.44–1.00)
GFR calc Af Amer: >60 mL/min (ref >60)
GFR calc non Af Amer: >60 mL/min (ref >60)
Glucose, Bld: 156 mg/dL — ABNORMAL HIGH (ref 70–99)
Potassium: 4.5 mmol/L (ref 3.5–5.1)
Sodium: 141 mmol/L (ref 135–145)
Total Bilirubin: 0.6 mg/dL (ref 0.3–1.2)
Total Protein: 7 g/dL (ref 6.5–8.1)

## 2019-07-25 LAB — URINALYSIS, ROUTINE W REFLEX MICROSCOPIC
Bacteria, UA: NONE SEEN
Bilirubin Urine: NEGATIVE
Glucose, UA: >=500 mg/dL — AB
Hgb urine dipstick: NEGATIVE
Ketones, ur: NEGATIVE mg/dL
Nitrite: POSITIVE — AB
Protein, ur: NEGATIVE mg/dL
Specific Gravity, Urine: 1.032 — ABNORMAL HIGH (ref 1.005–1.030)
pH: 5 (ref 5.0–8.0)

## 2019-07-25 LAB — LIPASE, BLOOD: Lipase: 18 U/L (ref 11–51)

## 2019-07-25 LAB — SARS CORONAVIRUS 2 BY RT PCR (HOSPITAL ORDER, PERFORMED IN ~~LOC~~ HOSPITAL LAB): SARS Coronavirus 2: NEGATIVE

## 2019-07-25 MED ORDER — IOHEXOL 300 MG/ML  SOLN
100.0000 mL | Freq: Once | INTRAMUSCULAR | Status: AC | PRN
  Administered 2019-07-25: 100 mL via INTRAVENOUS

## 2019-07-25 MED ORDER — FENTANYL CITRATE (PF) 100 MCG/2ML IJ SOLN
25.0000 ug | Freq: Once | INTRAMUSCULAR | Status: AC
  Administered 2019-07-25: 16:00:00 25 ug via INTRAVENOUS
  Filled 2019-07-25: qty 2

## 2019-07-25 MED ORDER — SODIUM CHLORIDE 0.9 % IV SOLN
1.0000 g | Freq: Once | INTRAVENOUS | Status: AC
  Administered 2019-07-25: 1 g via INTRAVENOUS
  Filled 2019-07-25: qty 10

## 2019-07-25 MED ORDER — ONDANSETRON HCL 4 MG/2ML IJ SOLN
4.0000 mg | Freq: Once | INTRAMUSCULAR | Status: AC
  Administered 2019-07-25: 4 mg via INTRAVENOUS
  Filled 2019-07-25: qty 2

## 2019-07-25 MED ORDER — SODIUM CHLORIDE 0.9 % IV BOLUS
1000.0000 mL | Freq: Once | INTRAVENOUS | Status: AC
  Administered 2019-07-25: 1000 mL via INTRAVENOUS

## 2019-07-25 MED ORDER — CEFUROXIME AXETIL 500 MG PO TABS: 500.0000 mg | ORAL_TABLET | Freq: Two times a day (BID) | ORAL | 0 refills | Status: AC

## 2019-07-25 MED ORDER — CEFUROXIME AXETIL 500 MG PO TABS: 500.0000 mg | ORAL_TABLET | Freq: Two times a day (BID) | ORAL | 0 refills | Status: DC

## 2019-07-25 NOTE — ED Notes (Signed)
Sig pad not working. DC papers to be given to EMS

## 2019-07-25 NOTE — ED Provider Notes (Signed)
[facility] Emergency Department Provider Note  ____________________________________________   First MD Initiated Contact with Patient 07/25/19 1511     (approximate)  I have reviewed the triage vital signs and the nursing notes.   HISTORY  Chief Complaint Nausea and Emesis    HPI Tammy [name] is a 58 y.o. female with bipolar, CHF, COPD, seizures who presents with nausea and emesis.  Patient says that since Friday, 3 days she has had nausea, vomiting, diarrhea.  The diarrhea is nonbloody.  The vomiting is nonbloody bloody nonbilious.  She reportedly had a fever at her facility this morning and was given Tylenol around 3 AM but does not have a fever currently.  She did start having right lower quadrant abdominal pain that started today that is moderate, constant, nothing makes better, nothing makes it worse.  Denies any dysuria.  Patient is on baseline oxygen but denies any new shortness of breath, chest pain.  Denies any recent seizure activity.  Continue all of her medications.          Past Medical History:  Diagnosis Date  . Anxiety   . Asthma   . Bipolar 1 disorder (HCC)   . CHF (congestive heart failure) (HCC)   . COPD (chronic obstructive pulmonary disease) (HCC)   . Dementia (HCC)   . Epilepsy (HCC)   . Seizures [facility])     Patient Active Problem List   Diagnosis Date Noted  . B12 deficiency 06/23/2019  . Iron deficiency 06/21/2019  . Seizure (HCC) 03/22/2019  . Normocytic anemia 03/18/2019  . Influenza A 11/10/2018  . Hyponatremia 10/26/2018  . Syncopal episodes 06/23/2017  . Acute on chronic respiratory failure with hypoxia (HCC) 03/15/2017  . COPD with acute exacerbation (HCC) 03/15/2017  . Acute on chronic respiratory failure (HCC) 03/15/2017    Past Surgical History:  Procedure Laterality Date  . APPENDECTOMY    . hernia reapir     x 3      Prior to Admission medications   Medication Sig Start Date End Date Taking?  Authorizing Provider  acetaminophen (TYLENOL) 500 MG tablet Take 500-1,000 mg by mouth daily as needed for mild pain, moderate pain or fever.     [provider]  albuterol (PROVENTIL HFA;VENTOLIN HFA) 108 (90 BASE) MCG/ACT inhaler Inhale 4-6 puffs by mouth every 4 hours as needed for wheezing, cough, and/or shortness of breath 07/01/15   Loleta Rose, MD  busPIRone (BUSPAR) 30 MG tablet Take 30 mg by mouth 2 (two) times daily.     [provider]  carvedilol (COREG) 3.125 MG tablet Take 3.125 mg by mouth 2 (two) times daily with a meal.     [provider]  cetirizine (ZYRTEC) 10 MG tablet Take 10 mg by mouth daily.    [provider]  cyanocobalamin (,VITAMIN B-12,) 1000 MCG/ML injection 1000 mcg IM Once a month 03/18/19   Borders, Daryl Eastern, NP  Cyanocobalamin 1000 MCG SUBL Place 1 tablet (1,000 mcg total) under the tongue daily. 07/01/19   Earna Coder, MD  diphenhydramine-acetaminophen (TYLENOL PM) 25-500 MG TABS tablet Take 1 tablet by mouth at bedtime as needed (sleep/pain).     [provider]  empagliflozin (JARDIANCE) 10 MG TABS tablet Take 10 mg by mouth daily.     [provider]  ferrous sulfate 325 (65 FE) MG EC tablet Take 1 tablet (325 mg total) by mouth daily with breakfast. 03/18/19   Borders, Daryl Eastern, NP  fluticasone (  FLONASE) 50 MCG/ACT nasal spray Place 1 spray into both nostrils daily.     [provider]  lamoTRIgine (LAMICTAL) 25 MG tablet Wk1:1tab night wk2:1tab 2xday wk3:1tab AM 2tabsPM wk4:2tabs 2xday wk5:2tabs AM 3tabs PM wk6:3tabs 2xday wk7:3tabs AM 4tabs PM, wk8:4tab2xday wk9: 4tabAM and 5tabPM wk10:5tab2xday 06/24/19 07/22/19  [provider]  levETIRAcetam (KEPPRA) 500 MG tablet Take 1 tablet (500 mg total) by mouth 2 (two) times daily for 30 days. 03/23/19 06/22/19  Ihor AustinPyreddy, Pavan, MD  levothyroxine (SYNTHROID, LEVOTHROID) 100 MCG tablet Take 100 mcg by mouth daily before breakfast.     [provider]  lisinopril (ZESTRIL) 2.5 MG tablet  04/16/19   [provider]  lithium carbonate 150 MG capsule Take 150 mg by mouth daily.     [provider]  metFORMIN (GLUCOPHAGE) 500 MG tablet Take 500 mg by mouth 2 (two) times daily with a meal.    [provider]  Omega-3 Fatty Acids (FISH OIL) 1000 MG CAPS Take 1 capsule by mouth 2 (two) times daily.    [provider]  pravastatin (PRAVACHOL) 40 MG tablet Take 40 mg by mouth daily.    [provider]  risperiDONE (RISPERDAL) 3 MG tablet Take 3 mg by mouth at bedtime.     [provider]  rOPINIRole (REQUIP) 1 MG tablet Take 1 mg by mouth at bedtime.    [provider]  tamsulosin (FLOMAX) 0.4 MG CAPS capsule Take 1 capsule (0.4 mg total) by mouth daily. 03/25/17   McGowan, Shannon A, PA-C  TRELEGY ELLIPTA 100-62.5-25 MCG/INH AEPB Inhale 1 puff into the lungs daily.  11/17/18   [provider]  Vitamin D, Ergocalciferol, (DRISDOL) 1.25 MG (50000 UT) CAPS capsule Take 50,000 Units by mouth every 7 (seven) days.    [provider]    Allergies Penicillins and Prednisone  Family History  Problem Relation Age of Onset  . Hypertension Mother   . Kidney cancer Neg Hx   . Bladder Cancer Neg Hx     Social History Social History   Tobacco Use  . Smoking status: Former Smoker    Quit date: 10/07/1998    Years since quitting: 20.8  . Smokeless tobacco: Never Used  Substance Use Topics  . Alcohol use: No  . Drug use: No      Review of Systems Constitutional: No fever/chills Eyes: No visual changes. ENT: No sore throat. Cardiovascular: Denies chest pain. Respiratory: Denies shortness of breath. Gastrointestinal: Positive abdominal pain, vomiting, diarrhea Genitourinary: Negative for dysuria. Musculoskeletal: Negative for back pain. Skin: Negative for rash. Neurological: Negative for headaches, focal weakness or numbness. All other ROS negative  ____________________________________________   PHYSICAL EXAM:  VITAL SIGNS: Blood pressure 124/69, pulse 74, temperature 98.9 F (37.2 C), temperature source Oral, resp. rate 18, height 5\' 4"  (1.626 m), weight 77.1 kg, SpO2 92 %.  Constitutional: Alert and oriented. Well appearing and in no acute distress. Eyes: Conjunctivae are normal. EOMI. Head: Atraumatic. Nose: No congestion/rhinnorhea. Mouth/Throat: Mucous membranes are moist.   Neck: No stridor. Trachea Midline. FROM Cardiovascular: Normal rate, regular rhythm. Grossly normal heart sounds.  Good peripheral circulation. Respiratory: Normal respiratory effort.  No retractions. Lungs CTAB. Gastrointestinal: Soft  but with right lower quadrant tenderness no distention. No abdominal bruits.  Musculoskeletal: No lower extremity tenderness nor edema.  No joint effusions. Neurologic:  Normal speech and language. No gross focal neurologic deficits are appreciated.  Skin:  Skin is warm, dry and intact. No rash noted.  Psychiatric: Mood and affect are normal. Speech and behavior are normal. GU: Deferred   ____________________________________________   LABS (all labs ordered are listed, but only abnormal results are displayed)  Labs Reviewed  COMPREHENSIVE METABOLIC PANEL - Abnormal; Notable for the following components:      Result Value   Glucose, Bld 156 (*)    All other components within normal limits  URINALYSIS, ROUTINE W REFLEX MICROSCOPIC - Abnormal; Notable for the following components:   Color, Urine YELLOW (*)    APPearance CLOUDY (*)    Specific Gravity, Urine 1.032 (*)    Glucose, UA >=500 (*)    Nitrite POSITIVE (*)    Leukocytes,Ua MODERATE (*)    All other components within normal limits  SARS CORONAVIRUS 2 BY RT PCR (HOSPITAL ORDER, PERFORMED IN [facility])  URINE CULTURE  CBC WITH DIFFERENTIAL/PLATELET  LIPASE, BLOOD   ____________________________________________   RADIOLOGY Vela Prose, personally viewed and evaluated these images (plain radiographs) as part of my medical decision making, as well as reviewing the written report by the radiologist.  ED MD interpretation:  No PNA   Official radiology report(s): Dg Chest 1 View  Result Date: 07/25/2019 CLINICAL DATA:  Fever EXAM: CHEST  1 VIEW COMPARISON:  12/15/2018 FINDINGS: The heart size and mediastinal contours are stable. Calcific aortic knob. Similar linear bibasilar scarring versus atelectasis. Lungs otherwise clear. No pleural effusion or pneumothorax. The visualized skeletal structures are unremarkable. IMPRESSION: Stable linear scarring/atelectasis in the lung bases. No focal airspace consolidation. Electronically Signed   By: Duanne Guess M.D.   On: 07/25/2019 17:03   Ct Abdomen Pelvis W Contrast  Result Date: 07/25/2019 CLINICAL DATA:  Abdominal pain. Right lower quadrant pain. Nausea and vomiting. EXAM: CT ABDOMEN AND PELVIS WITH CONTRAST TECHNIQUE: Multidetector CT imaging of the abdomen and pelvis was performed using the standard protocol following bolus administration of intravenous contrast. CONTRAST:  OMNIPAQUE IOHEXOL 300 MG/ML  SOLN COMPARISON:  September 07, 2017 FINDINGS: Lower chest: No acute abnormality. Hepatobiliary: Previous cholecystectomy. Mildly prominent common bile duct is consistent previous cholecystectomy. No filling defects identified. No liver masses identified. Portal vein is patent. Pancreas: Unremarkable. No pancreatic ductal dilatation or surrounding inflammatory changes. Spleen: Normal in size without focal abnormality. Adrenals/Urinary Tract: The nodular left adrenal gland is unchanged since 2008. Right adrenal gland is stable and normal. The kidneys are normal in appearance. Ureters are normal as is the bladder. Stomach/Bowel: The patient is status post gastric bypass. The stomach is stable in appearance since December 2018. The small bowel is normal. Moderate fecal loading in  the colon. The patient is status post appendectomy. Vascular/Lymphatic: Atherosclerotic changes are seen in the nonaneurysmal aorta, iliac vessels, and femoral vessels. No adenopathy identified. Reproductive: Uterus and bilateral adnexa are unremarkable. Other: No abdominal wall hernia or abnormality. No abdominopelvic ascites. Musculoskeletal: No acute or significant osseous findings. IMPRESSION: 1. No cause for acute symptoms identified. 2. Stable nodule left adrenal gland. 3. Atherosclerotic changes in the abdominal aorta. Electronically Signed   By: Gerome Sam III M.D   On: 07/25/2019 18:01    ____________________________________________   PROCEDURES  Procedure(s) performed (including Critical Care):  Ultrasound ED Peripheral IV (Provider)  Date/Time: 07/25/2019 4:02 PM Performed by: Concha Se, MD Authorized by: Concha Se, MD   Procedure details:    Location:  Right AC   Angiocath:  20 G   Bedside Ultrasound Guided: Yes     Images: not archived  Patient tolerated procedure without complications: Yes     Dressing applied: Yes       ____________________________________________   INITIAL IMPRESSION / ASSESSMENT AND PLAN / ED COURSE  Tammy [name] was evaluated in Emergency Department on 07/25/2019 for the symptoms described in the history of present illness. She was evaluated in the context of the global COVID-19 pandemic, which necessitated consideration that the patient might be at risk for infection with the SARS-CoV-2 virus that causes COVID-19. Institutional protocols and algorithms that pertain to the evaluation of patients at risk for COVID-19 are in a state of rapid change based on information released by regulatory bodies including the CDC and federal and state organizations. These policies and algorithms were followed during the patient's care in the ED.    Patient is a 58 year old who presents with nausea, vomiting, diarrhea and right lower quadrant  abdominal pain.  Will get CT scan to evaluate for diverticulitis, appendicitis, SBO given patient has had a history of hernia repairs.  Also get labs evaluate for dehydration, electrolyte abnormalities.  Will get coronavirus testing given patient needs to go back to the facility if work-up is negative.  CT scan was negative.  Labs are reassuring.  Coronavirus test was negative.  Reevaluated patient.  Abdominal exam is benign.  Patient's not had any vomiting while being in the ER.  Patient feels comfortable with being discharged back to her facility at this time given resolution of her symptoms.  I suspect it is most likely some part of gastroenteritis.  Prior to discharge patient had 12 beat run of V. tach.  Patient's K is fine.  Discussed with Dr. Ubaldo Glassing.  He is okay with patient following up outpatient with cardiology tomorrow given she is asymptomatic and her electrolytes are normal.  Do not think that this was the cause of her nausea vomiting and diarrhea.  UA concerning for UTI.  Will give dose ceftriaxone, send culture and start ceferoxamine.  Patient's prior urine culture was sensitive.  Patient otherwise continues look well tolerating p.o.  Will discharge patient home and follow-up with cardiology tomorrow.   ____________________________________________   FINAL CLINICAL IMPRESSION(S) / ED DIAGNOSES   Final diagnoses:  Nausea vomiting and diarrhea  NSVT (nonsustained ventricular tachycardia) (HCC)  Acute cystitis without hematuria      MEDICATIONS GIVEN DURING THIS VISIT:  Medications  cefTRIAXone (ROCEPHIN) 1 g in sodium chloride 0.9 % 100 mL IVPB (has no administration in time range)  sodium chloride 0.9 % bolus 1,000 mL (0 mLs Intravenous Stopped 07/25/19 1949)  ondansetron (ZOFRAN) injection 4 mg (4 mg Intravenous Given 07/25/19 1623)  fentaNYL (SUBLIMAZE) injection 25 mcg (25 mcg Intravenous Given 07/25/19 1624)  iohexol (OMNIPAQUE) 300 MG/ML solution 100 mL (100 mLs  Intravenous Contrast Given 07/25/19 1724)     ED Discharge Orders    None       Note:  This document was prepared using Dragon voice recognition software and may include unintentional dictation errors.   Vanessa [place], MD 07/25/19 2017

## 2019-07-25 NOTE — ED Notes (Signed)
Patient had 12 beat run of V-tach. Strip printed and given to MD.

## 2019-07-25 NOTE — ED Notes (Signed)
Report given to Penni Bombard at [facility]

## 2019-07-25 NOTE — Discharge Instructions (Addendum)
CT scan was negative.  Patient also had negative coronavirus test.  Urine was concerning for UTI.  We have given you dose of antibiotics to cover you till tomorrow.  Start the antibiotics and follow with your primary care doctor for culture results.  You also had a short run of ventricular tachycardia.  I discussed with cardiology.  You should call them tomorrow and see that you are seen in the ER and supposed to have follow-up tomorrow with them to get scheduled appointment time.  Return to the ER for fevers, worsening pain, vomiting or any other concerns  1. No cause for acute symptoms identified. 2. Stable nodule left adrenal gland. 3. Atherosclerotic changes in the abdominal aorta.

## 2019-07-25 NOTE — ED Notes (Signed)
Pt with history of dementia, but able to answer all questions appropriately at this time.

## 2019-07-25 NOTE — ED Notes (Signed)
Pt given food and drink per EDP. Pt tolerating well. Waiting on EMS for transport.

## 2019-07-25 NOTE — ED Triage Notes (Signed)
Pt from [facility]. C/O N/V/D since Friday. Last vomited 1230 today.

## 2019-07-25 NOTE — ED Notes (Signed)
EMS arrived to take pt. Pt dcd with all belongings with [facility]. Pt without c/o nausea.

## 2019-07-28 LAB — URINE CULTURE: Culture: >=100000 — AB

## 2019-07-29 NOTE — Progress Notes (Addendum)
Brief Pharmacy Note  Patient with recent ED visit at [facility] on 10/18 c/o NVD, fevers, and right lower quadrant abdominal pain. At that time she denied any dysuria. Patient is on an SGLT2 inhibitor. UA concerning for UTI. She received 1x dose of ceftriaxone and was d/c on cefuroxime. Urine culture resulted 100,000 colonies/mL Enterobacter cloacae. Sensitivities indicate resistance to cefazolin but susceptible to ceftriaxone.   Spoke with EDP about concern for resistance to 2nd generation cephalosporin and was advised to f/u with patient to assess symptoms. Patient and caregiver were reached via telephone. Caregiver endorses giving abx since discharge. Patient endorses burning sensation with urination. Bacteria susceptible to Bactrim. Verbal prescription from EDP for Bactrim 1 DS BID x3 days called into [facility] where patient fills medications. Caregiver instructed to stop administering cefuroxime and start new prescription.   Lockie Mola, Pharmacy Resident 29 July 2019

## 2019-08-03 DIAGNOSIS — R569 Unspecified convulsions: Secondary | ICD-10-CM | POA: Insufficient documentation

## 2019-08-19 ENCOUNTER — Other Ambulatory Visit: Payer: Self-pay | Admitting: *Deleted

## 2019-08-19 DIAGNOSIS — D649 Anemia, unspecified: Secondary | ICD-10-CM

## 2019-08-20 MED ORDER — FERROUS SULFATE 325 (65 FE) MG PO TBEC: 325.0000 mg | DELAYED_RELEASE_TABLET | Freq: Every day | ORAL | 3 refills | Status: AC

## 2019-08-20 NOTE — Telephone Encounter (Signed)
Already responded by other means via fax from [facility]

## 2019-08-28 ENCOUNTER — Other Ambulatory Visit: Payer: Self-pay

## 2019-08-28 ENCOUNTER — Encounter: Payer: Self-pay | Admitting: Intensive Care

## 2019-08-28 ENCOUNTER — Emergency Department: Payer: Medicare Other

## 2019-08-28 ENCOUNTER — Emergency Department
Admission: EM | Admit: 2019-08-28 | Discharge: 2019-08-28 | Disposition: A | Discharge status: Home / Self Care | Payer: Medicare Other | Attending: Student | Admitting: Student

## 2019-08-28 DIAGNOSIS — J45909 Unspecified asthma, uncomplicated: Secondary | ICD-10-CM | POA: Diagnosis not present

## 2019-08-28 DIAGNOSIS — Z79899 Other long term (current) drug therapy: Secondary | ICD-10-CM | POA: Insufficient documentation

## 2019-08-28 DIAGNOSIS — J449 Chronic obstructive pulmonary disease, unspecified: Secondary | ICD-10-CM | POA: Diagnosis not present

## 2019-08-28 DIAGNOSIS — Z7984 Long term (current) use of oral hypoglycemic drugs: Secondary | ICD-10-CM | POA: Insufficient documentation

## 2019-08-28 DIAGNOSIS — R531 Weakness: Secondary | ICD-10-CM | POA: Diagnosis present

## 2019-08-28 DIAGNOSIS — F0390 Unspecified dementia without behavioral disturbance: Secondary | ICD-10-CM | POA: Insufficient documentation

## 2019-08-28 DIAGNOSIS — Z87891 Personal history of nicotine dependence: Secondary | ICD-10-CM | POA: Diagnosis not present

## 2019-08-28 LAB — CBC
HCT: 42 % (ref 36.0–46.0)
Hemoglobin: 13.8 g/dL (ref 12.0–15.0)
MCH: 30.3 pg (ref 26.0–34.0)
MCHC: 32.9 g/dL (ref 30.0–36.0)
MCV: 92.3 fL (ref 80.0–100.0)
Platelets: 173 10*3/uL (ref 150–400)
RBC: 4.55 MIL/uL (ref 3.87–5.11)
RDW: 13 % (ref 11.5–15.5)
WBC: 7.6 10*3/uL (ref 4.0–10.5)
nRBC: 0 % (ref 0.0–0.2)

## 2019-08-28 LAB — BASIC METABOLIC PANEL
Anion gap: 10 (ref 5–15)
BUN: 11 mg/dL (ref 6–20)
CO2: 23 mmol/L (ref 22–32)
Calcium: 9.1 mg/dL (ref 8.9–10.3)
Chloride: 99 mmol/L (ref 98–111)
Creatinine, Ser: 0.95 mg/dL (ref 0.44–1.00)
GFR calc Af Amer: >60 mL/min (ref >60)
GFR calc non Af Amer: >60 mL/min (ref >60)
Glucose, Bld: 139 mg/dL — ABNORMAL HIGH (ref 70–99)
Potassium: 4.2 mmol/L (ref 3.5–5.1)
Sodium: 132 mmol/L — ABNORMAL LOW (ref 135–145)

## 2019-08-28 LAB — URINALYSIS, COMPLETE (UACMP) WITH MICROSCOPIC
Bacteria, UA: NONE SEEN
Bilirubin Urine: NEGATIVE
Glucose, UA: NEGATIVE mg/dL
Hgb urine dipstick: NEGATIVE
Ketones, ur: NEGATIVE mg/dL
Leukocytes,Ua: NEGATIVE
Nitrite: NEGATIVE
Protein, ur: NEGATIVE mg/dL
Specific Gravity, Urine: 1.002 — ABNORMAL LOW (ref 1.005–1.030)
pH: 6 (ref 5.0–8.0)

## 2019-08-28 LAB — TROPONIN I (HIGH SENSITIVITY)
Troponin I (High Sensitivity): 18 ng/L — ABNORMAL HIGH (ref <18)
Troponin I (High Sensitivity): 18 ng/L — ABNORMAL HIGH (ref <18)

## 2019-08-28 MED ORDER — ACETAMINOPHEN 325 MG PO TABS
650.0000 mg | ORAL_TABLET | Freq: Once | ORAL | Status: AC
  Administered 2019-08-28: 650 mg via ORAL
  Filled 2019-08-28: qty 2

## 2019-08-28 NOTE — ED Notes (Signed)
Pt walked with x2 people walking beside her. Pt did not lean into helpers. Pt walked with a slightly shuffle but patient reports this is how she walks normally at home. Pt to be d/c with a walker.

## 2019-08-28 NOTE — Discharge Instructions (Addendum)
Thank you for letting us take care of you in the emergency department today.   Please continue to take any regular, prescribed medications.   Use the walker provided to assist with your stability with ambulation as needed.   Please follow up with: - Your primary care doctor to review your ER visit and follow up on your symptoms.   Please return to the ER for any new or worsening symptoms.

## 2019-08-28 NOTE — ED Notes (Signed)
Pt shown how to use walker. Pt walker adjusted to height. Pt reports feeling comfortable using it. Pt able to get into wheelchair with minimal assistance. Pt wheeled out to caregivers. Caregivers verbalized understanding of using walker. Unable to sign due to picking pt up at curb. Simona Huh, group home caregiver, and other caregiver picking pt up and taking her home.

## 2019-08-28 NOTE — ED Notes (Signed)
This RN spoke to group home worker about pt getting a walker. Tammy [name] verbalized understanding and is willing to come pick patient up.

## 2019-08-28 NOTE — ED Provider Notes (Signed)
[facility] Emergency Department Provider Note  ____________________________________________   First MD Initiated Contact with Patient 08/28/19 1706     (approximate)  I have reviewed the triage vital signs and the nursing notes.  History  Chief Complaint Weakness    HPI Tammy [name] is a 58 y.o. female with history of bipolar, COPD on 2 L, bipolar, seizures, dementia who presents for weakness. Patient lives at a community care living facility. Apparently today her legs got weak and seemed to "give out" from under her. Staff was able to catch her and lower her to the ground, avoiding any fall or injury. Patient has an ongoing chronic issue with this. She does report a fall last night, after accidentally rolling out of bed. She did hit her head on her dresser doing this. She denies any pain currently. No fevers, SOB, cough, chest pain, abdominal pain, nausea, vomiting, diarrhea, or urinary symptoms.  Denies any weakness at present, no numbness, tingling, speech changes, facial droop.   Past Medical Hx Past Medical History:  Diagnosis Date  . Anxiety   . Asthma   . Bipolar 1 disorder (HCC)   . CHF (congestive heart failure) (HCC)   . COPD (chronic obstructive pulmonary disease) (HCC)   . Dementia (HCC)   . Epilepsy (HCC)   . Seizures [facility])     Problem List Patient Active Problem List   Diagnosis Date Noted  . B12 deficiency 06/23/2019  . Iron deficiency 06/21/2019  . Seizure (HCC) 03/22/2019  . Normocytic anemia 03/18/2019  . Influenza A 11/10/2018  . Hyponatremia 10/26/2018  . Syncopal episodes 06/23/2017  . Acute on chronic respiratory failure with hypoxia (HCC) 03/15/2017  . COPD with acute exacerbation (HCC) 03/15/2017  . Acute on chronic respiratory failure (HCC) 03/15/2017    Past Surgical Hx Past Surgical History:  Procedure Laterality Date  . APPENDECTOMY    . hernia reapir     x 3      Medications Prior to Admission  medications   Medication Sig Start Date End Date Taking? Authorizing Provider  acetaminophen (TYLENOL) 500 MG tablet Take 500-1,000 mg by mouth daily as needed for mild pain, moderate pain or fever.     [provider]  albuterol (PROVENTIL HFA;VENTOLIN HFA) 108 (90 BASE) MCG/ACT inhaler Inhale 4-6 puffs by mouth every 4 hours as needed for wheezing, cough, and/or shortness of breath 07/01/15   Loleta Rose, MD  busPIRone (BUSPAR) 30 MG tablet Take 30 mg by mouth 2 (two) times daily.     [provider]  carvedilol (COREG) 3.125 MG tablet Take 3.125 mg by mouth 2 (two) times daily with a meal.     [provider]  cetirizine (ZYRTEC) 10 MG tablet Take 10 mg by mouth daily.    [provider]  cyanocobalamin (,VITAMIN B-12,) 1000 MCG/ML injection 1000 mcg IM Once a month 03/18/19   Borders, Daryl Eastern, NP  Cyanocobalamin 1000 MCG SUBL Place 1 tablet (1,000 mcg total) under the tongue daily. 07/01/19   Earna Coder, MD  diphenhydramine-acetaminophen (TYLENOL PM) 25-500 MG TABS tablet Take 1 tablet by mouth at bedtime as needed (sleep/pain).     [provider]  empagliflozin (JARDIANCE) 10 MG TABS tablet Take 10 mg by mouth daily.     [provider]  ferrous sulfate 325 (65 FE) MG EC tablet Take 1 tablet (325 mg total) by mouth daily with breakfast. 08/20/19   Earna Coder, MD  fluticasone Aleda Grana)  50 MCG/ACT nasal spray Place 1 spray into both nostrils daily.     [provider]  lamoTRIgine (LAMICTAL) 25 MG tablet Wk1:1tab night wk2:1tab 2xday wk3:1tab AM 2tabsPM wk4:2tabs 2xday wk5:2tabs AM 3tabs PM wk6:3tabs 2xday wk7:3tabs AM 4tabs PM, wk8:4tab2xday wk9: 4tabAM and 5tabPM wk10:5tab2xday 06/24/19 07/22/19  [provider]  levETIRAcetam (KEPPRA) 500 MG tablet Take 1 tablet (500 mg total) by mouth 2 (two) times daily for 30 days. 03/23/19 06/22/19  Ihor AustinPyreddy, Pavan, MD  levothyroxine (SYNTHROID, LEVOTHROID) 100 MCG  tablet Take 100 mcg by mouth daily before breakfast.     [provider]  lisinopril (ZESTRIL) 2.5 MG tablet  04/16/19   [provider]  lithium carbonate 150 MG capsule Take 150 mg by mouth daily.     [provider]  metFORMIN (GLUCOPHAGE) 500 MG tablet Take 500 mg by mouth 2 (two) times daily with a meal.    [provider]  Omega-3 Fatty Acids (FISH OIL) 1000 MG CAPS Take 1 capsule by mouth 2 (two) times daily.    [provider]  pravastatin (PRAVACHOL) 40 MG tablet Take 40 mg by mouth daily.    [provider]  risperiDONE (RISPERDAL) 3 MG tablet Take 3 mg by mouth at bedtime.     [provider]  rOPINIRole (REQUIP) 1 MG tablet Take 1 mg by mouth at bedtime.    [provider]  tamsulosin (FLOMAX) 0.4 MG CAPS capsule Take 1 capsule (0.4 mg total) by mouth daily. 03/25/17   McGowan, Shannon A, PA-C  TRELEGY ELLIPTA 100-62.5-25 MCG/INH AEPB Inhale 1 puff into the lungs daily.  11/17/18   [provider]  Vitamin D, Ergocalciferol, (DRISDOL) 1.25 MG (50000 UT) CAPS capsule Take 50,000 Units by mouth every 7 (seven) days.    [provider]    Allergies Penicillins and Prednisone  Family Hx Family History  Problem Relation Age of Onset  . Hypertension Mother   . Kidney cancer Neg Hx   . Bladder Cancer Neg Hx     Social Hx Social History   Tobacco Use  . Smoking status: Former Smoker    Quit date: 10/07/1998    Years since quitting: 20.9  . Smokeless tobacco: Never Used  Substance Use Topics  . Alcohol use: No  . Drug use: No     Review of Systems  Constitutional: Negative for fever, chills. + weakness of legs Eyes: Negative for visual changes. ENT: Negative for sore throat. Cardiovascular: Negative for chest pain. Respiratory: Negative for shortness of breath. Gastrointestinal: Negative for nausea, vomiting.  Genitourinary: Negative for dysuria. Musculoskeletal: Negative for leg  swelling. Skin: Negative for rash. Neurological: Negative for for headaches.   Physical Exam  Vital Signs: ED Triage Vitals  Enc Vitals Group     BP 08/28/19 1504 134/62     Pulse Rate 08/28/19 1504 87     Resp 08/28/19 1504 16     Temp 08/28/19 1504 98.9 F (37.2 C)     Temp Source 08/28/19 1504 Oral     SpO2 08/28/19 1504 (!) 86 %     Weight --      Height --      Head Circumference --      Peak Flow --      Pain Score 08/28/19 1517 0     Pain Loc --      Pain Edu? --      Excl. in GC? --     Constitutional: Alert and  oriented.  Head: Normocephalic. Atraumatic. Eyes: Conjunctivae clear. Sclera anicteric. Nose: No congestion. No rhinorrhea. Mouth/Throat: Wearing mask.  Neck: No stridor.   Cardiovascular: Normal rate, regular rhythm. Extremities well perfused. Respiratory: Normal respiratory effort.  Lungs CTAB. On 2 L , which is her baseline. Gastrointestinal: Soft. Non-tender. Non-distended.  Musculoskeletal: No lower extremity edema. No deformities. FROM to bilateral shoulders, elbows, wrists, hips, knees, ankles.  Neurologic:  Normal speech and language. No gross focal neurologic deficits are appreciated. UE and LE strength 5/5 throughout, symmetric. SILT.  Skin: Abrasion to the right anterior knee.  Psychiatric: Mood and affect are appropriate for situation.  EKG  Personally reviewed.   Rate: 68 Rhythm: sinus Axis: normal Intervals: WNL No acute ischemic changes No STEMI    Radiology  CT head: IMPRESSION:  No acute intracranial abnormality.   CXR: IMPRESSION:  No acute cardiopulmonary disease.   XR knee: IMPRESSION:  Negative.    Procedures  Procedure(s) performed (including critical care):  Procedures   Initial Impression / Assessment and Plan / ED Course  58 y.o. female who presents to the ED for LE weakness and near fall.  On exam, she has a small abrasion to the right anterior knee, but otherwise her strength is 5/5 and intact,  symmetric to the bilateral lower extremities.  Full range of motion throughout.  Ddx: UTI, electrolyte abnormality, general decline, intracranial injury from her fall/head injury last night, infection, ACS  Will obtain labs, imaging.  Labs w/o actionable derangements. No UTI.  High-sensitivity troponin 18, repeat stable at 18 and EKG without acute ischemic changes. Imaging negative.   Patient was able to ambulate here at her baseline with a slight shuffle, but otherwise stable.  Given negative work-up and ambulating at her baseline, feel she is stable for discharge.  Will provide her with a walker to use as needed for stability and further ambulation assistance.  Advised PCP follow-up and given return precautions.   Final Clinical Impression(s) / ED Diagnosis  Final diagnoses:  Weakness       Note:  This document was prepared using Dragon voice recognition software and may include unintentional dictation errors.   Lilia Pro., MD 08/28/19 2015

## 2019-08-28 NOTE — ED Triage Notes (Signed)
Patient from community care living with staff member. Staff reports her legs gave out on her this morning and staff was able to help her to the floor without falling. Reports this is an ongoing problem. Denies pain anywhere. Denies urinary symptoms. Wears 2L O2 chronically. Staff reports they had to carry her to the car.

## 2019-09-09 ENCOUNTER — Ambulatory Visit (INDEPENDENT_AMBULATORY_CARE_PROVIDER_SITE_OTHER): Payer: Medicare Other | Admitting: Podiatry

## 2019-09-09 ENCOUNTER — Other Ambulatory Visit: Payer: Self-pay

## 2019-09-09 ENCOUNTER — Encounter: Payer: Self-pay | Admitting: Podiatry

## 2019-09-09 DIAGNOSIS — E119 Type 2 diabetes mellitus without complications: Secondary | ICD-10-CM | POA: Diagnosis not present

## 2019-09-09 DIAGNOSIS — B351 Tinea unguium: Secondary | ICD-10-CM

## 2019-09-09 DIAGNOSIS — M79676 Pain in unspecified toe(s): Secondary | ICD-10-CM | POA: Diagnosis not present

## 2019-09-09 NOTE — Progress Notes (Signed)
Complaint:  Visit Type: Patient returns to my office for continued preventative foot care services. Complaint: Patient states" my nails have grown long and thick and become painful to walk and wear shoes" Patient has been diagnosed with DM and now takes metformin.. The patient presents for preventative foot care services. No changes to ROS  Podiatric Exam: Vascular: dorsalis pedis and posterior tibial pulses are palpable bilateral. Capillary return is immediate. Temperature gradient is WNL. Skin turgor WNL  Sensorium: Normal Semmes Weinstein monofilament test. Normal tactile sensation bilaterally. Nail Exam: Pt has thick disfigured discolored nails with subungual debris noted bilateral entire nail hallux through fifth toenails Ulcer Exam: There is no evidence of ulcer or pre-ulcerative changes or infection. Orthopedic Exam: Muscle tone and strength are WNL. No limitations in general ROM. No crepitus or effusions noted. Foot type and digits show no abnormalities. HAV  B/L. Dorsal  DJD  B/L Skin: No Porokeratosis. No infection or ulcers  Diagnosis:  Onychomycosis, , Pain in right toe, pain in left toes  Treatment & Plan Procedures and Treatment: Consent by patient was obtained for treatment procedures.   Debridement of mycotic and hypertrophic toenails, 1 through 5 bilateral and clearing of subungual debris. No ulceration, no infection noted.  Return Visit-Office Procedure: Patient instructed to return to the office for a follow up visit 3 months for continued evaluation and treatment.    Isaura Schiller DPM 

## 2019-09-22 ENCOUNTER — Inpatient Hospital Stay: Payer: Medicare Other

## 2019-09-22 ENCOUNTER — Inpatient Hospital Stay: Payer: Medicare Other | Admitting: Internal Medicine

## 2019-10-04 ENCOUNTER — Other Ambulatory Visit: Payer: Self-pay

## 2019-10-04 NOTE — Progress Notes (Signed)
Information for screening was provided by caretaker at her group home.

## 2019-10-05 ENCOUNTER — Other Ambulatory Visit: Payer: Self-pay

## 2019-10-05 ENCOUNTER — Inpatient Hospital Stay (HOSPITAL_BASED_OUTPATIENT_CLINIC_OR_DEPARTMENT_OTHER): Payer: Medicare Other | Admitting: Internal Medicine

## 2019-10-05 ENCOUNTER — Inpatient Hospital Stay: Payer: Medicare Other | Attending: Internal Medicine

## 2019-10-05 ENCOUNTER — Inpatient Hospital Stay: Payer: Medicare Other

## 2019-10-05 DIAGNOSIS — E611 Iron deficiency: Secondary | ICD-10-CM

## 2019-10-05 DIAGNOSIS — E538 Deficiency of other specified B group vitamins: Secondary | ICD-10-CM | POA: Insufficient documentation

## 2019-10-05 DIAGNOSIS — D649 Anemia, unspecified: Secondary | ICD-10-CM

## 2019-10-05 MED ORDER — CYANOCOBALAMIN 1000 MCG/ML IJ SOLN
1000.0000 ug | Freq: Once | INTRAMUSCULAR | Status: AC
  Administered 2019-10-05: 1000 ug via INTRAMUSCULAR
  Filled 2019-10-05: qty 1

## 2019-10-05 NOTE — Progress Notes (Signed)
[facility] CONSULT NOTE  Patient Care Team: Armando GangLindley, Cheryl P, FNP as PCP - General (Family Medicine)  CHIEF COMPLAINTS/PURPOSE OF CONSULTATION: Anemia.   HEMATOLOGY HISTORY  #Normocytic ANEMIA [?  Colonoscopy [facility] 5 years ago]; July 2020- [facility] evaluation-high risk for colonoscopy; hemoglobin 13/iron studies adequate  HISTORY OF PRESENTING ILLNESS: Spoke to caregiver,  Tammy [name] 58 y.o.  female with history of multiple medical problems including bipolar disorder, chronic diastolic heart failure/COPD is currently a group home resident.  Patient is a poor historian.    Patient was recently evaluated in the emergency room approximately month ago s/p fall.   Denies any blood in stools or black or stools.  No shortness of breath or cough.  Review of Systems  Unable to perform ROS: Psychiatric disorder    MEDICAL HISTORY:  Past Medical History:  Diagnosis Date  . Anxiety   . Asthma   . Bipolar 1 disorder (HCC)   . CHF (congestive heart failure) (HCC)   . COPD (chronic obstructive pulmonary disease) (HCC)   . Dementia (HCC)   . Epilepsy (HCC)   . Seizures (HCC)     SURGICAL HISTORY: Past Surgical History:  Procedure Laterality Date  . APPENDECTOMY    . hernia reapir     x 3      SOCIAL HISTORY: Social History   Socioeconomic History  . Marital status: Widowed    Spouse name: Not on file  . Number of children: Not on file  . Years of education: Not on file  . Highest education level: Not on file  Occupational History  . Not on file  Tobacco Use  . Smoking status: Former Smoker    Quit date: 10/07/1998    Years since quitting: 21.0  . Smokeless tobacco: Never Used  Substance and Sexual Activity  . Alcohol use: No  . Drug use: No  . Sexual activity: Never  Other Topics Concern  . Not on file  Social History Narrative  . Not on file   Social Determinants of Health   Financial Resource Strain: Low Risk   . Difficulty of Paying  Living Expenses: Not hard at all  Food Insecurity: No Food Insecurity  . Worried About Programme researcher, broadcasting/film/videounning Out of Food in the Last Year: Never true  . Ran Out of Food in the Last Year: Never true  Transportation Needs: No Transportation Needs  . Lack of Transportation (Medical): No  . Lack of Transportation (Non-Medical): No  Physical Activity: Unknown  . Days of Exercise per Week: 0 days  . Minutes of Exercise per Session: Patient refused  Stress: No Stress Concern Present  . Feeling of Stress : Not at all  Social Connections:   . Frequency of Communication with Friends and Family: Not on file  . Frequency of Social Gatherings with Friends and Family: Not on file  . Attends Religious Services: Not on file  . Active Member of Clubs or Organizations: Not on file  . Attends BankerClub or Organization Meetings: Not on file  . Marital Status: Not on file  Intimate Partner Violence:   . Fear of Current or Ex-Partner: Not on file  . Emotionally Abused: Not on file  . Physically Abused: Not on file  . Sexually Abused: Not on file    FAMILY HISTORY: Family History  Problem Relation Age of Onset  . Hypertension Mother   . Kidney cancer Neg Hx   . Bladder Cancer Neg Hx     ALLERGIES:  is allergic to penicillins and prednisone.  MEDICATIONS:  Current Outpatient Medications  Medication Sig Dispense Refill  . acetaminophen (TYLENOL) 500 MG tablet Take 500-1,000 mg by mouth daily as needed for mild pain, moderate pain or fever.     . busPIRone (BUSPAR) 30 MG tablet Take 30 mg by mouth 2 (two) times daily.     . carvedilol (COREG) 3.125 MG tablet Take 3.125 mg by mouth 2 (two) times daily with a meal.     . cetirizine (ZYRTEC) 10 MG tablet Take 10 mg by mouth daily.    . cyanocobalamin (,VITAMIN B-12,) 1000 MCG/ML injection 1000 mcg IM Once a month (Patient taking differently: Inject 1,000 mcg into the muscle every 30 (thirty) days. ) 10 mL 1  . Cyanocobalamin 1000 MCG SUBL Place 1 tablet (1,000 mcg  total) under the tongue daily. 90 tablet 0  . empagliflozin (JARDIANCE) 25 MG TABS tablet Take 25 mg by mouth daily.     . ferrous sulfate 325 (65 FE) MG EC tablet Take 1 tablet (325 mg total) by mouth daily with breakfast. 30 tablet 3  . fluticasone (FLONASE) 50 MCG/ACT nasal spray Place 1 spray into both nostrils daily.     Marland Kitchen lamoTRIgine (LAMICTAL) 150 MG tablet Take 150 mg by mouth 2 (two) times daily.    Marland Kitchen levETIRAcetam (KEPPRA) 250 MG tablet Take 250 mg by mouth 2 (two) times daily.    Marland Kitchen levothyroxine (SYNTHROID, LEVOTHROID) 100 MCG tablet Take 100 mcg by mouth daily before breakfast.     . lithium carbonate 150 MG capsule Take 150 mg by mouth daily.     . metFORMIN (GLUCOPHAGE) 500 MG tablet Take 500 mg by mouth 2 (two) times daily with a meal.    . Omega-3 Fatty Acids (FISH OIL) 1000 MG CAPS Take 1,000 mg by mouth 2 (two) times daily.     . pravastatin (PRAVACHOL) 40 MG tablet Take 40 mg by mouth daily.    . risperiDONE (RISPERDAL) 3 MG tablet Take 3 mg by mouth at bedtime.     Marland Kitchen rOPINIRole (REQUIP) 1 MG tablet Take 1 mg by mouth at bedtime.    . tamsulosin (FLOMAX) 0.4 MG CAPS capsule Take 1 capsule (0.4 mg total) by mouth daily. 90 capsule 3  . TRELEGY ELLIPTA 100-62.5-25 MCG/INH AEPB Inhale 1 puff into the lungs daily.     . Vitamin D, Ergocalciferol, (DRISDOL) 1.25 MG (50000 UT) CAPS capsule Take 50,000 Units by mouth every 7 (seven) days.     No current facility-administered medications for this visit.      PHYSICAL EXAMINATION:   Vitals:   10/05/19 1336  BP: 131/73  Pulse: 79  Temp: (!) 97.2 F (36.2 C)   Filed Weights   10/05/19 1336  Weight: 176 lb (79.8 kg)    Physical Exam  Constitutional: She is oriented to person, place, and time and well-developed, well-nourished, and in no distress.  O2 nasal cannula.  Alone.  HENT:  Head: Normocephalic and atraumatic.  Mouth/Throat: Oropharynx is clear and moist. No oropharyngeal exudate.  Eyes: Pupils are equal,  round, and reactive to light.  Cardiovascular: Normal rate and regular rhythm.  Pulmonary/Chest: No respiratory distress. She has no wheezes.  Abdominal: Soft. Bowel sounds are normal. She exhibits no distension and no mass. There is no abdominal tenderness. There is no rebound and no guarding.  Musculoskeletal:        General: No tenderness or edema. Normal range of motion.  Cervical back: Normal range of motion and neck supple.  Neurological: She is alert and oriented to person, place, and time.  Skin: Skin is warm.  Psychiatric:  Flat affect.    LABORATORY DATA:  I have reviewed the data as listed Lab Results  Component Value Date   WBC 7.6 08/28/2019   HGB 13.8 08/28/2019   HCT 42.0 08/28/2019   MCV 92.3 08/28/2019   PLT 173 08/28/2019   Recent Labs    03/22/19 1249 06/30/19 1131 07/25/19 1607 08/28/19 1535  NA 137 138 141 132*  K 4.0 4.5 4.5 4.2  CL 103 104 106 99  CO2 25 24 27 23   GLUCOSE 199* 97 156* 139*  BUN 21* 15 15 11   CREATININE 1.08* 1.10* 1.00 0.95  CALCIUM 9.2 9.6 9.7 9.1  GFRNONAA 57* 55* >60 >60  GFRAA >60 >60 >60 >60  PROT 6.3* 6.8 7.0  --   ALBUMIN 3.7 3.9 4.0  --   AST 23 22 20   --   ALT 17 16 15   --   ALKPHOS 90 94 93  --   BILITOT 0.5 0.5 0.6  --      No results found.  Normocytic anemia # Anemia- iron def- hb 10.4-currently on p.o. iron; in [place] hb-13; Iron sat- 83%.;  Poor candidate for invasive procedures.  Labs from emergency room November 21-hemoglobin 13.  # Hx of B12 deficiency-on SL B12 supplementation. Proceed with B12 injection.   # Unable to draw labs today because of poor IV access; discussed regarding a port placement/IV blood draws and infusions if needed.  However patient has not needed any iron infusion for the last many months.  However this could be readdressed if patient needs infusions in the future. Discussed with Dennis/caregiver  # Multiple medical problems: COPD/Diastolic heart failure/ Bipolar  disorder-stable   # I spoke at length with the patient's caregiver, Simona Huh regarding the patient's clinical status/plan of care.  Caregiver in agreement.   # DISPOISTION # NO ferrahem today # B12 injection today # follow up in 6 months- MD;  Lab-cbc/cmp/iron studies/ferritin-B12; possible ferahem/B12 injection-  Dr.B    All questions were answered. The patient knows to call the clinic with any problems, questions or concerns.      Cammie Sickle, MD 10/05/2019 3:04 PM

## 2019-10-05 NOTE — Assessment & Plan Note (Addendum)
#   Anemia- iron def- hb 10.4-currently on p.o. iron; in [place] hb-13; Iron sat- 83%.;  Poor candidate for invasive procedures.  Labs from emergency room November 21-hemoglobin 13.  # Hx of B12 deficiency-on SL B12 supplementation. Proceed with B12 injection.   # Unable to draw labs today because of poor IV access; discussed regarding a port placement/IV blood draws and infusions if needed.  However patient has not needed any iron infusion for the last many months.  However this could be readdressed if patient needs infusions in the future. Discussed with Dennis/caregiver  # Multiple medical problems: COPD/Diastolic heart failure/ Bipolar disorder-stable   # I spoke at length with the patient's caregiver, Simona Huh regarding the patient's clinical status/plan of care.  Caregiver in agreement.   # DISPOISTION # NO ferrahem today # B12 injection today # follow up in 6 months- MD;  Lab-cbc/cmp/iron studies/ferritin-B12; possible ferahem/B12 injection-  Dr.B

## 2019-12-02 ENCOUNTER — Encounter: Payer: Self-pay | Admitting: Podiatry

## 2019-12-02 ENCOUNTER — Ambulatory Visit (INDEPENDENT_AMBULATORY_CARE_PROVIDER_SITE_OTHER): Payer: Medicare Other | Admitting: Podiatry

## 2019-12-02 ENCOUNTER — Other Ambulatory Visit: Payer: Self-pay

## 2019-12-02 DIAGNOSIS — M79676 Pain in unspecified toe(s): Secondary | ICD-10-CM

## 2019-12-02 DIAGNOSIS — E119 Type 2 diabetes mellitus without complications: Secondary | ICD-10-CM | POA: Diagnosis not present

## 2019-12-02 DIAGNOSIS — B351 Tinea unguium: Secondary | ICD-10-CM

## 2019-12-02 NOTE — Progress Notes (Signed)
Complaint:  Visit Type: Patient returns to my office for continued preventative foot care services. Complaint: Patient states" my nails have grown long and thick and become painful to walk and wear shoes" Patient has been diagnosed with DM and now takes metformin.. The patient presents for preventative foot care services. No changes to ROS  Podiatric Exam: Vascular: dorsalis pedis and posterior tibial pulses are palpable bilateral. Capillary return is immediate. Temperature gradient is WNL. Skin turgor WNL  Sensorium: Normal Semmes Weinstein monofilament test. Normal tactile sensation bilaterally. Nail Exam: Pt has thick disfigured discolored nails with subungual debris noted bilateral entire nail hallux through fifth toenails Ulcer Exam: There is no evidence of ulcer or pre-ulcerative changes or infection. Orthopedic Exam: Muscle tone and strength are WNL. No limitations in general ROM. No crepitus or effusions noted. Foot type and digits show no abnormalities. HAV  B/L. Dorsal  DJD  B/L Skin: No Porokeratosis. No infection or ulcers  Diagnosis:  Onychomycosis, , Pain in right toe, pain in left toes  Treatment & Plan Procedures and Treatment: Consent by patient was obtained for treatment procedures.   Debridement of mycotic and hypertrophic toenails, 1 through 5 bilateral and clearing of subungual debris. No ulceration, no infection noted.  Return Visit-Office Procedure: Patient instructed to return to the office for a follow up visit 3 months for continued evaluation and treatment.    Helane Gunther DPM

## 2019-12-26 ENCOUNTER — Emergency Department: Payer: Medicare Other

## 2019-12-26 ENCOUNTER — Other Ambulatory Visit: Payer: Self-pay

## 2019-12-26 ENCOUNTER — Emergency Department
Admission: EM | Admit: 2019-12-26 | Discharge: 2019-12-26 | Disposition: A | Discharge status: Home / Self Care | Payer: Medicare Other | Attending: Emergency Medicine | Admitting: Emergency Medicine

## 2019-12-26 DIAGNOSIS — Y999 Unspecified external cause status: Secondary | ICD-10-CM | POA: Diagnosis not present

## 2019-12-26 DIAGNOSIS — W19XXXA Unspecified fall, initial encounter: Secondary | ICD-10-CM

## 2019-12-26 DIAGNOSIS — Y929 Unspecified place or not applicable: Secondary | ICD-10-CM | POA: Insufficient documentation

## 2019-12-26 DIAGNOSIS — I509 Heart failure, unspecified: Secondary | ICD-10-CM | POA: Diagnosis not present

## 2019-12-26 DIAGNOSIS — S42201A Unspecified fracture of upper end of right humerus, initial encounter for closed fracture: Secondary | ICD-10-CM

## 2019-12-26 DIAGNOSIS — M79631 Pain in right forearm: Secondary | ICD-10-CM | POA: Insufficient documentation

## 2019-12-26 DIAGNOSIS — S42294A Other nondisplaced fracture of upper end of right humerus, initial encounter for closed fracture: Secondary | ICD-10-CM | POA: Diagnosis not present

## 2019-12-26 DIAGNOSIS — W010XXA Fall on same level from slipping, tripping and stumbling without subsequent striking against object, initial encounter: Secondary | ICD-10-CM | POA: Diagnosis not present

## 2019-12-26 DIAGNOSIS — Z9981 Dependence on supplemental oxygen: Secondary | ICD-10-CM | POA: Diagnosis not present

## 2019-12-26 DIAGNOSIS — Y9389 Activity, other specified: Secondary | ICD-10-CM | POA: Diagnosis not present

## 2019-12-26 DIAGNOSIS — J449 Chronic obstructive pulmonary disease, unspecified: Secondary | ICD-10-CM | POA: Diagnosis not present

## 2019-12-26 DIAGNOSIS — S4991XA Unspecified injury of right shoulder and upper arm, initial encounter: Secondary | ICD-10-CM | POA: Diagnosis present

## 2019-12-26 LAB — COMPREHENSIVE METABOLIC PANEL
ALT: 15 U/L (ref 0–44)
AST: 15 U/L (ref 15–41)
Albumin: 3.7 g/dL (ref 3.5–5.0)
Alkaline Phosphatase: 80 U/L (ref 38–126)
Anion gap: 6 (ref 5–15)
BUN: 22 mg/dL — ABNORMAL HIGH (ref 6–20)
CO2: 25 mmol/L (ref 22–32)
Calcium: 9.3 mg/dL (ref 8.9–10.3)
Chloride: 110 mmol/L (ref 98–111)
Creatinine, Ser: 1.15 mg/dL — ABNORMAL HIGH (ref 0.44–1.00)
GFR calc Af Amer: >60 mL/min (ref >60)
GFR calc non Af Amer: 52 mL/min — ABNORMAL LOW (ref >60)
Glucose, Bld: 128 mg/dL — ABNORMAL HIGH (ref 70–99)
Potassium: 4.9 mmol/L (ref 3.5–5.1)
Sodium: 141 mmol/L (ref 135–145)
Total Bilirubin: 0.8 mg/dL (ref 0.3–1.2)
Total Protein: 6.6 g/dL (ref 6.5–8.1)

## 2019-12-26 LAB — CBC WITH DIFFERENTIAL/PLATELET
Abs Immature Granulocytes: 0.04 10*3/uL (ref 0.00–0.07)
Basophils Absolute: 0 10*3/uL (ref 0.0–0.1)
Basophils Relative: 0 %
Eosinophils Absolute: 0.1 10*3/uL (ref 0.0–0.5)
Eosinophils Relative: 1 %
HCT: 44 % (ref 36.0–46.0)
Hemoglobin: 13.7 g/dL (ref 12.0–15.0)
Immature Granulocytes: 1 %
Lymphocytes Relative: 21 %
Lymphs Abs: 1.8 10*3/uL (ref 0.7–4.0)
MCH: 31.2 pg (ref 26.0–34.0)
MCHC: 31.1 g/dL (ref 30.0–36.0)
MCV: 100.2 fL — ABNORMAL HIGH (ref 80.0–100.0)
Monocytes Absolute: 0.8 10*3/uL (ref 0.1–1.0)
Monocytes Relative: 9 %
Neutro Abs: 5.8 10*3/uL (ref 1.7–7.7)
Neutrophils Relative %: 68 %
Platelets: 152 10*3/uL (ref 150–400)
RBC: 4.39 MIL/uL (ref 3.87–5.11)
RDW: 12.8 % (ref 11.5–15.5)
WBC: 8.5 10*3/uL (ref 4.0–10.5)
nRBC: 0.2 % (ref 0.0–0.2)

## 2019-12-26 LAB — URINALYSIS, COMPLETE (UACMP) WITH MICROSCOPIC
Bacteria, UA: NONE SEEN
Bilirubin Urine: NEGATIVE
Glucose, UA: >=500 mg/dL — AB
Hgb urine dipstick: NEGATIVE
Ketones, ur: NEGATIVE mg/dL
Nitrite: NEGATIVE
Protein, ur: NEGATIVE mg/dL
Specific Gravity, Urine: 1.027 (ref 1.005–1.030)
WBC, UA: >50 WBC/hpf — ABNORMAL HIGH (ref 0–5)
pH: 5 (ref 5.0–8.0)

## 2019-12-26 MED ORDER — HYDROCODONE-ACETAMINOPHEN 5-325 MG PO TABS
1.0000 | ORAL_TABLET | Freq: Once | ORAL | Status: AC
  Administered 2019-12-26: 1 via ORAL
  Filled 2019-12-26: qty 1

## 2019-12-26 MED ORDER — ONDANSETRON 4 MG PO TBDP
4.0000 mg | ORAL_TABLET | Freq: Once | ORAL | Status: AC
  Administered 2019-12-26: 4 mg via ORAL
  Filled 2019-12-26: qty 1

## 2019-12-26 MED ORDER — HYDROCODONE-ACETAMINOPHEN 5-325 MG PO TABS: 1.0000 | ORAL_TABLET | Freq: Four times a day (QID) | ORAL | 0 refills | Status: DC | PRN

## 2019-12-26 NOTE — ED Provider Notes (Signed)
Emergency Department Provider Note  ____________________________________________  Time seen: Approximately 4:47 PM  I have reviewed the triage vital signs and the nursing notes.   HISTORY  Chief Complaint Fall and Arm Pain   Historian Patient     HPI Tammy [name] is a 59 y.o. female with a history of anxiety, CHF, COPD and dementia presents to the emergency department after a mechanical fall that occurred yesterday. Patient states that she fell on her right upper arm and has since had pain. [facility] and had x-rays taken and they recommended patient to come to the emergency department for possible surgery. Patient did not bring her x-rays with her. She denies hitting her head or her neck. Patient states that she requires supplemental oxygen at home at baseline. She denies chest pain, chest tightness or abdominal pain. No new numbness or tingling in the upper and lower extremities. No other alleviating measures have been attempted.    Past Medical History:  Diagnosis Date  . Anxiety   . Asthma   . Bipolar 1 disorder (HCC)   . CHF (congestive heart failure) (HCC)   . COPD (chronic obstructive pulmonary disease) (HCC)   . Dementia (HCC)   . Epilepsy (HCC)   . Seizures (HCC)      Immunizations up to date:  Yes.     Past Medical History:  Diagnosis Date  . Anxiety   . Asthma   . Bipolar 1 disorder (HCC)   . CHF (congestive heart failure) (HCC)   . COPD (chronic obstructive pulmonary disease) (HCC)   . Dementia (HCC)   . Epilepsy (HCC)   . Seizures [facility])     Patient Active Problem List   Diagnosis Date Noted  . B12 deficiency 06/23/2019  . Iron deficiency 06/21/2019  . Seizure (HCC) 03/22/2019  . Normocytic anemia 03/18/2019  . Influenza A 11/10/2018  . Hyponatremia 10/26/2018  . Syncopal episodes 06/23/2017  . Acute on chronic respiratory failure with hypoxia (HCC) 03/15/2017  . COPD with acute exacerbation (HCC) 03/15/2017  . Acute on  chronic respiratory failure (HCC) 03/15/2017    Past Surgical History:  Procedure Laterality Date  . APPENDECTOMY    . hernia reapir     x 3      Prior to Admission medications   Medication Sig Start Date End Date Taking? Authorizing Provider  acetaminophen (TYLENOL) 500 MG tablet Take 500-1,000 mg by mouth daily as needed for mild pain, moderate pain or fever.     [provider]  busPIRone (BUSPAR) 30 MG tablet Take 30 mg by mouth 2 (two) times daily.     [provider]  carvedilol (COREG) 3.125 MG tablet Take 3.125 mg by mouth 2 (two) times daily with a meal.     [provider]  cetirizine (ZYRTEC) 10 MG tablet Take 10 mg by mouth daily.    [provider]  cyanocobalamin (,VITAMIN B-12,) 1000 MCG/ML injection 1000 mcg IM Once a month Patient taking differently: Inject 1,000 mcg into the muscle every 30 (thirty) days.  03/18/19   Borders, Daryl Eastern, NP  Cyanocobalamin 1000 MCG SUBL Place 1 tablet (1,000 mcg total) under the tongue daily. 07/01/19   Earna Coder, MD  empagliflozin (JARDIANCE) 25 MG TABS tablet Take 25 mg by mouth daily.     [provider]  ferrous sulfate 325 (65 FE) MG EC tablet Take 1 tablet (325 mg total) by mouth daily with breakfast. 08/20/19   Louretta Shorten  R, MD  fluticasone (FLONASE) 50 MCG/ACT nasal spray Place 1 spray into both nostrils daily.     [provider]  HYDROcodone-acetaminophen (NORCO) 5-325 MG tablet Take 1 tablet by mouth every 6 (six) hours as needed for up to 3 days for moderate pain. 12/26/19 12/29/19  Orvil Feil, PA-C  lamoTRIgine (LAMICTAL) 150 MG tablet Take 150 mg by mouth 2 (two) times daily.    [provider]  levETIRAcetam (KEPPRA) 250 MG tablet Take 250 mg by mouth 2 (two) times daily.    [provider]  levothyroxine (SYNTHROID, LEVOTHROID) 100 MCG tablet Take 100 mcg by mouth daily before breakfast.     [provider]  lithium  carbonate 150 MG capsule Take 150 mg by mouth daily.     [provider]  metFORMIN (GLUCOPHAGE) 500 MG tablet Take 500 mg by mouth 2 (two) times daily with a meal.    [provider]  Omega-3 Fatty Acids (FISH OIL) 1000 MG CAPS Take 1,000 mg by mouth 2 (two) times daily.     [provider]  pravastatin (PRAVACHOL) 40 MG tablet Take 40 mg by mouth daily.    [provider]  risperiDONE (RISPERDAL) 3 MG tablet Take 3 mg by mouth at bedtime.     [provider]  rOPINIRole (REQUIP) 1 MG tablet Take 1 mg by mouth at bedtime.    [provider]  tamsulosin (FLOMAX) 0.4 MG CAPS capsule Take 1 capsule (0.4 mg total) by mouth daily. 03/25/17   McGowan, Shannon A, PA-C  TRELEGY ELLIPTA 100-62.5-25 MCG/INH AEPB Inhale 1 puff into the lungs daily.  11/17/18   [provider]  Vitamin D, Ergocalciferol, (DRISDOL) 1.25 MG (50000 UT) CAPS capsule Take 50,000 Units by mouth every 7 (seven) days.    [provider]    Allergies Penicillins and Prednisone  Family History  Problem Relation Age of Onset  . Hypertension Mother   . Kidney cancer Neg Hx   . Bladder Cancer Neg Hx     Social History Social History   Tobacco Use  . Smoking status: Former Smoker    Quit date: 10/07/1998    Years since quitting: 21.2  . Smokeless tobacco: Never Used  Substance Use Topics  . Alcohol use: No  . Drug use: No     Review of Systems  Constitutional: No fever/chills Eyes:  No discharge ENT: No upper respiratory complaints. Respiratory: no cough. No SOB/ use of accessory muscles to breath Gastrointestinal:   No nausea, no vomiting.  No diarrhea.  No constipation. Musculoskeletal: Patient has right upper arm and forearm pain.  Skin: Negative for rash, abrasions, lacerations, ecchymosis.    ____________________________________________   PHYSICAL EXAM:  VITAL SIGNS: ED Triage Vitals  Enc Vitals Group     BP 12/26/19 1606 125/71      Pulse Rate 12/26/19 1606 95     Resp 12/26/19 1606 18     Temp 12/26/19 1606 99.5 F (37.5 C)     Temp Source 12/26/19 1606 Oral     SpO2 12/26/19 1606 91 %     Weight 12/26/19 1604 180 lb (81.6 kg)     Height 12/26/19 1604 5\' 2"  (1.575 m)     Head Circumference --      Peak Flow --      Pain Score 12/26/19 1604 6     Pain Loc --      Pain Edu? --  Excl. in [place]? --      Constitutional: Alert and oriented. Well appearing and in no acute distress. Eyes: Conjunctivae are normal. PERRL. EOMI. Head: Atraumatic. ENT:      Ears: TMs are pearly.       Nose: No congestion/rhinnorhea.      Mouth/Throat: Mucous membranes are moist.  Neck: No stridor.  No cervical spine tenderness to palpation. Cardiovascular: Normal rate, regular rhythm. Normal S1 and S2.  Good peripheral circulation. Respiratory: Normal respiratory effort without tachypnea or retractions. Lungs CTAB. Good air entry to the bases with no decreased or absent breath sounds Gastrointestinal: Bowel sounds x 4 quadrants. Soft and nontender to palpation. No guarding or rigidity. No distention. Musculoskeletal: Patient is unable to perform full range of motion of the right shoulder and right elbow. She is able to move all 5 right fingers. Palpable radial and ulnar pulses bilaterally and symmetrically. Neurologic:  Normal for age. No gross focal neurologic deficits are appreciated.  Skin:  Skin is warm, dry and intact. No rash noted. Psychiatric: Mood and affect are normal for age. Speech and behavior are normal.   ____________________________________________   LABS (all labs ordered are listed, but only abnormal results are displayed)  Labs Reviewed  CBC WITH DIFFERENTIAL/PLATELET - Abnormal; Notable for the following components:      Result Value   MCV 100.2 (*)    All other components within normal limits  COMPREHENSIVE METABOLIC PANEL - Abnormal; Notable for the following components:   Glucose, Bld 128 (*)    BUN 22  (*)    Creatinine, Ser 1.15 (*)    GFR calc non Af Amer 52 (*)    All other components within normal limits  URINALYSIS, COMPLETE (UACMP) WITH MICROSCOPIC - Abnormal; Notable for the following components:   Color, Urine YELLOW (*)    APPearance CLOUDY (*)    Glucose, UA >=500 (*)    Leukocytes,Ua SMALL (*)    WBC, UA >50 (*)    All other components within normal limits   ____________________________________________  EKG   ____________________________________________  RADIOLOGY Unk Pinto, personally viewed and evaluated these images (plain radiographs) as part of my medical decision making, as well as reviewing the written report by the radiologist.    DG Chest 1 View  Result Date: 12/26/2019 CLINICAL DATA:  Pt states fall with bruises to R arm. EXAM: CHEST  1 VIEW COMPARISON:  Chest radiograph 08/28/2019 FINDINGS: Stable cardiomediastinal contours aortic arch calcification. Chronic bilateral coarse interstitial thickening. Mild opacities at the bilateral lung bases likely reflect atelectasis or scarring. No acute finding in the visualized skeleton. IMPRESSION: Mild bibasilar opacities likely representing scarring or atelectasis. Chronic bronchitic change. Electronically Signed   By: Audie Pinto M.D.   On: 12/26/2019 17:35   DG Shoulder Right  Result Date: 12/26/2019 CLINICAL DATA:  59 year old female with history of trauma from a fall complaining of right shoulder pain. EXAM: RIGHT SHOULDER - 2+ VIEW COMPARISON:  No priors. FINDINGS: Acute displaced fracture of the greater tuberosity of the right proximal humerus which is located superiorly and laterally to the right humeral head. There is also a subtle nondisplaced transverse fracture through the humeral neck. IMPRESSION: 1. Acute nondisplaced transverse fracture through the right humeral neck with widely displaced avulsion fracture of the greater tuberosity, as above. Electronically Signed   By: Vinnie Langton M.D.    On: 12/26/2019 17:46   DG Forearm Right  Result Date: 12/26/2019 CLINICAL DATA:  59 year old female with  history of trauma from a fall. Right elbow pain. EXAM: RIGHT FOREARM - 2 VIEW COMPARISON:  No priors. FINDINGS: Postoperative changes of ORIF in the proximal ulna, where there is a fixation screw and cerclage wire. No acute displaced fracture, subluxation or dislocation. Soft tissues are unremarkable. IMPRESSION: 1. No acute radiographic abnormality of the right elbow. Electronically Signed   By: Trudie Reed M.D.   On: 12/26/2019 17:40   DG Humerus Right  Result Date: 12/26/2019 CLINICAL DATA:  Fall. Bruises of the RIGHT arm. EXAM: RIGHT HUMERUS - 2+ VIEW COMPARISON:  Prior chest x-ray on 08/28/2019 and RIGHT shoulder 12/26/2019 FINDINGS: There is a comminuted fracture proximal RIGHT humerus with avulsion of the greater tuberosity. Greater tuberosity appears interposed between the acromion and humeral head. Distal humerus is intact. Remote proximal radius ORIF. Significant soft tissue swelling of the UPPER arm. IMPRESSION: Comminuted fracture of the proximal humerus. Electronically Signed   By: Norva Pavlov M.D.   On: 12/26/2019 17:44    ____________________________________________    PROCEDURES  Procedure(s) performed:     Procedures     Medications  HYDROcodone-acetaminophen (NORCO/VICODIN) 5-325 MG per tablet 1 tablet (1 tablet Oral Given 12/26/19 1732)  ondansetron (ZOFRAN-ODT) disintegrating tablet 4 mg (4 mg Oral Given 12/26/19 1732)  HYDROcodone-acetaminophen (NORCO/VICODIN) 5-325 MG per tablet 1 tablet (1 tablet Oral Given 12/26/19 2307)  ondansetron (ZOFRAN-ODT) disintegrating tablet 4 mg (4 mg Oral Given 12/26/19 2307)     ____________________________________________   INITIAL IMPRESSION / ASSESSMENT AND PLAN / ED COURSE  Pertinent labs & imaging results that were available during my care of the patient were reviewed by me and considered in my medical decision  making (see chart for details).    Assessment and Plan:  Fall:  Patient presents to the emergency department after a mechanical fall 1 day ago.  [facility] at [facility] and was diagnosed with 2 fractures of the upper arm. Patient did not have her x-rays on a disc for visualization in the emergency department.  Will obtain repeat x-rays of the right humerus and right forearm and will reassess. Norco was given for pain.  X-ray examination reveals right humeral neck and greater tuberosity fractures.  Patient was splinted in the emergency department and she was advised to follow-up with orthopedics, Dr. Odis Luster.  Patient was discharged with Vicodin.  Return precautions were given to return with new or worsening symptoms.  All patient questions were answered.  ____________________________________________  FINAL CLINICAL IMPRESSION(S) / ED DIAGNOSES  Final diagnoses:  Fall, initial encounter  Closed fracture of proximal end of right humerus, unspecified fracture morphology, initial encounter      NEW MEDICATIONS STARTED DURING THIS VISIT:  ED Discharge Orders         Ordered    HYDROcodone-acetaminophen (NORCO) 5-325 MG tablet  Every 6 hours PRN     12/26/19 2251              This chart was dictated using voice recognition software/Dragon. Despite best efforts to proofread, errors can occur which can change the meaning. Any change was purely unintentional.     Orvil Feil, PA-C 12/26/19 2312    Minna Antis, MD 12/26/19 [phone]

## 2019-12-26 NOTE — ED Notes (Signed)
Pt ambulated to bathroom without assistance 

## 2019-12-26 NOTE — ED Notes (Signed)
Lab contacted again to come stick pt

## 2019-12-26 NOTE — ED Notes (Signed)
See triage note, pt states she is here "to have surgery". Unsure what she needs surgery for, states "I forgot".  Pt c/o right arm pain since yesterday. Pt disoriented to time.  Wears 2L [place] at home.

## 2019-12-26 NOTE — ED Notes (Signed)
Lab called to obtain blood work.

## 2019-12-26 NOTE — ED Triage Notes (Signed)
Pt states fall with bruises to R arm. Upon inspection bruised to R bicep. Pt was seen at [facility] and sent here. Pt states she was sent here "for surgery." informed pt she needs to follow up with ortho doc for that. Pt denies prescription for pain meds states "I wish they would have" Papers from [facility] state R humerus fx. Pt did not arrive in sling or cast. Chronic 02 user.

## 2019-12-27 ENCOUNTER — Telehealth: Payer: Self-pay | Admitting: Emergency Medicine

## 2019-12-27 MED ORDER — HYDROCODONE-ACETAMINOPHEN 5-325 MG PO TABS: 1.0000 | ORAL_TABLET | Freq: Four times a day (QID) | ORAL | 0 refills | Status: DC | PRN

## 2019-12-27 NOTE — Telephone Encounter (Signed)
Patient's rx went to wrong pharmacy, corrected

## 2020-01-07 ENCOUNTER — Ambulatory Visit
Admission: RE | Admit: 2020-01-07 | Discharge: 2020-01-07 | Disposition: A | Discharge status: Home / Self Care | Payer: Medicare Other | Source: Ambulatory Visit | Attending: Orthopedic Surgery | Admitting: Orthopedic Surgery

## 2020-01-07 ENCOUNTER — Other Ambulatory Visit: Payer: Self-pay | Admitting: Orthopedic Surgery

## 2020-01-07 ENCOUNTER — Other Ambulatory Visit: Payer: Self-pay

## 2020-01-07 DIAGNOSIS — S42211A Unspecified displaced fracture of surgical neck of right humerus, initial encounter for closed fracture: Secondary | ICD-10-CM

## 2020-03-02 ENCOUNTER — Encounter: Payer: Self-pay | Admitting: Podiatry

## 2020-03-02 ENCOUNTER — Other Ambulatory Visit: Payer: Self-pay

## 2020-03-02 ENCOUNTER — Ambulatory Visit (INDEPENDENT_AMBULATORY_CARE_PROVIDER_SITE_OTHER): Payer: Medicare Other | Admitting: Podiatry

## 2020-03-02 DIAGNOSIS — E119 Type 2 diabetes mellitus without complications: Secondary | ICD-10-CM

## 2020-03-02 DIAGNOSIS — M79676 Pain in unspecified toe(s): Secondary | ICD-10-CM | POA: Diagnosis not present

## 2020-03-02 DIAGNOSIS — B351 Tinea unguium: Secondary | ICD-10-CM | POA: Diagnosis not present

## 2020-03-02 DIAGNOSIS — M79609 Pain in unspecified limb: Secondary | ICD-10-CM

## 2020-03-02 NOTE — Progress Notes (Signed)

## 2020-04-04 ENCOUNTER — Inpatient Hospital Stay: Payer: Medicare Other | Admitting: Internal Medicine

## 2020-04-04 ENCOUNTER — Other Ambulatory Visit: Payer: Self-pay

## 2020-04-04 ENCOUNTER — Inpatient Hospital Stay: Payer: Medicare Other | Attending: Internal Medicine

## 2020-04-04 ENCOUNTER — Inpatient Hospital Stay: Payer: Medicare Other

## 2020-04-04 DIAGNOSIS — E538 Deficiency of other specified B group vitamins: Secondary | ICD-10-CM

## 2020-04-04 DIAGNOSIS — E611 Iron deficiency: Secondary | ICD-10-CM

## 2020-06-05 ENCOUNTER — Ambulatory Visit (INDEPENDENT_AMBULATORY_CARE_PROVIDER_SITE_OTHER): Payer: Medicare Other | Admitting: Podiatry

## 2020-06-05 ENCOUNTER — Other Ambulatory Visit: Payer: Self-pay

## 2020-06-05 ENCOUNTER — Encounter: Payer: Self-pay | Admitting: Podiatry

## 2020-06-05 DIAGNOSIS — E119 Type 2 diabetes mellitus without complications: Secondary | ICD-10-CM | POA: Diagnosis not present

## 2020-06-05 DIAGNOSIS — B351 Tinea unguium: Secondary | ICD-10-CM

## 2020-06-05 DIAGNOSIS — M79676 Pain in unspecified toe(s): Secondary | ICD-10-CM | POA: Diagnosis not present

## 2020-06-05 NOTE — Progress Notes (Signed)

## 2020-09-11 ENCOUNTER — Encounter: Payer: Self-pay | Admitting: Podiatry

## 2020-09-11 ENCOUNTER — Ambulatory Visit (INDEPENDENT_AMBULATORY_CARE_PROVIDER_SITE_OTHER): Payer: Medicare Other | Admitting: Podiatry

## 2020-09-11 ENCOUNTER — Other Ambulatory Visit: Payer: Self-pay

## 2020-09-11 DIAGNOSIS — B351 Tinea unguium: Secondary | ICD-10-CM

## 2020-09-11 DIAGNOSIS — E119 Type 2 diabetes mellitus without complications: Secondary | ICD-10-CM

## 2020-09-11 DIAGNOSIS — M79609 Pain in unspecified limb: Secondary | ICD-10-CM | POA: Diagnosis not present

## 2020-09-11 NOTE — Progress Notes (Signed)

## 2020-12-11 ENCOUNTER — Other Ambulatory Visit: Payer: Self-pay

## 2020-12-11 ENCOUNTER — Encounter: Payer: Self-pay | Admitting: Podiatry

## 2020-12-11 ENCOUNTER — Ambulatory Visit (INDEPENDENT_AMBULATORY_CARE_PROVIDER_SITE_OTHER): Payer: Medicare Other | Admitting: Podiatry

## 2020-12-11 DIAGNOSIS — E119 Type 2 diabetes mellitus without complications: Secondary | ICD-10-CM

## 2020-12-11 DIAGNOSIS — B351 Tinea unguium: Secondary | ICD-10-CM | POA: Diagnosis not present

## 2020-12-11 DIAGNOSIS — M79609 Pain in unspecified limb: Secondary | ICD-10-CM | POA: Diagnosis not present

## 2020-12-11 NOTE — Progress Notes (Signed)

## 2021-03-15 ENCOUNTER — Ambulatory Visit (INDEPENDENT_AMBULATORY_CARE_PROVIDER_SITE_OTHER): Payer: Medicare Other | Admitting: Podiatry

## 2021-03-15 ENCOUNTER — Other Ambulatory Visit: Payer: Self-pay

## 2021-03-15 ENCOUNTER — Encounter: Payer: Self-pay | Admitting: Podiatry

## 2021-03-15 DIAGNOSIS — B351 Tinea unguium: Secondary | ICD-10-CM | POA: Diagnosis not present

## 2021-03-15 DIAGNOSIS — E119 Type 2 diabetes mellitus without complications: Secondary | ICD-10-CM | POA: Diagnosis not present

## 2021-03-15 DIAGNOSIS — M79676 Pain in unspecified toe(s): Secondary | ICD-10-CM

## 2021-03-15 NOTE — Progress Notes (Signed)

## 2021-06-21 ENCOUNTER — Ambulatory Visit: Payer: Medicare Other | Admitting: Podiatry

## 2021-09-27 ENCOUNTER — Encounter: Payer: Self-pay | Admitting: Podiatry

## 2021-09-27 ENCOUNTER — Ambulatory Visit (INDEPENDENT_AMBULATORY_CARE_PROVIDER_SITE_OTHER): Payer: Medicare Other | Admitting: Podiatry

## 2021-09-27 ENCOUNTER — Other Ambulatory Visit: Payer: Self-pay

## 2021-09-27 DIAGNOSIS — E119 Type 2 diabetes mellitus without complications: Secondary | ICD-10-CM

## 2021-09-27 DIAGNOSIS — M79676 Pain in unspecified toe(s): Secondary | ICD-10-CM | POA: Diagnosis not present

## 2021-09-27 DIAGNOSIS — B351 Tinea unguium: Secondary | ICD-10-CM

## 2021-09-27 NOTE — Progress Notes (Signed)

## 2021-12-19 ENCOUNTER — Other Ambulatory Visit: Payer: Self-pay | Admitting: Adult Health

## 2021-12-19 DIAGNOSIS — Z1231 Encounter for screening mammogram for malignant neoplasm of breast: Secondary | ICD-10-CM

## 2022-01-03 ENCOUNTER — Ambulatory Visit (INDEPENDENT_AMBULATORY_CARE_PROVIDER_SITE_OTHER): Payer: Medicare Other | Admitting: Podiatry

## 2022-01-03 ENCOUNTER — Encounter: Payer: Self-pay | Admitting: Podiatry

## 2022-01-03 DIAGNOSIS — E119 Type 2 diabetes mellitus without complications: Secondary | ICD-10-CM

## 2022-01-03 DIAGNOSIS — B351 Tinea unguium: Secondary | ICD-10-CM

## 2022-01-03 DIAGNOSIS — M79609 Pain in unspecified limb: Secondary | ICD-10-CM

## 2022-01-03 NOTE — Progress Notes (Signed)

## 2022-01-30 ENCOUNTER — Ambulatory Visit
Admission: RE | Admit: 2022-01-30 | Discharge: 2022-01-30 | Disposition: A | Discharge status: Home / Self Care | Payer: Medicare Other | Source: Ambulatory Visit | Attending: Adult Health | Admitting: Adult Health

## 2022-01-30 DIAGNOSIS — Z1231 Encounter for screening mammogram for malignant neoplasm of breast: Secondary | ICD-10-CM

## 2022-02-05 ENCOUNTER — Inpatient Hospital Stay
Admission: RE | Admit: 2022-02-05 | Discharge: 2022-02-05 | Disposition: A | Discharge status: Home / Self Care | Payer: Self-pay | Source: Ambulatory Visit | Attending: *Deleted | Admitting: *Deleted

## 2022-02-05 ENCOUNTER — Other Ambulatory Visit: Payer: Self-pay | Admitting: *Deleted

## 2022-02-05 DIAGNOSIS — Z1231 Encounter for screening mammogram for malignant neoplasm of breast: Secondary | ICD-10-CM

## 2022-04-22 ENCOUNTER — Encounter: Payer: Self-pay | Admitting: Podiatry

## 2022-04-22 ENCOUNTER — Ambulatory Visit (INDEPENDENT_AMBULATORY_CARE_PROVIDER_SITE_OTHER): Payer: Medicare Other | Admitting: Podiatry

## 2022-04-22 DIAGNOSIS — E119 Type 2 diabetes mellitus without complications: Secondary | ICD-10-CM | POA: Diagnosis not present

## 2022-04-22 DIAGNOSIS — M79609 Pain in unspecified limb: Secondary | ICD-10-CM

## 2022-04-22 DIAGNOSIS — B351 Tinea unguium: Secondary | ICD-10-CM

## 2022-04-22 NOTE — Progress Notes (Signed)

## 2022-05-06 ENCOUNTER — Ambulatory Visit: Payer: Medicare Other | Admitting: Podiatry

## 2022-07-07 ENCOUNTER — Emergency Department: Payer: Medicare Other

## 2022-07-07 ENCOUNTER — Emergency Department
Admission: EM | Admit: 2022-07-07 | Discharge: 2022-07-08 | Disposition: A | Discharge status: Skilled Nursing Facility | Payer: Medicare Other | Attending: Emergency Medicine | Admitting: Emergency Medicine

## 2022-07-07 ENCOUNTER — Other Ambulatory Visit: Payer: Self-pay

## 2022-07-07 ENCOUNTER — Encounter: Payer: Self-pay | Admitting: Radiology

## 2022-07-07 DIAGNOSIS — I509 Heart failure, unspecified: Secondary | ICD-10-CM | POA: Insufficient documentation

## 2022-07-07 DIAGNOSIS — J9611 Chronic respiratory failure with hypoxia: Secondary | ICD-10-CM

## 2022-07-07 DIAGNOSIS — J441 Chronic obstructive pulmonary disease with (acute) exacerbation: Secondary | ICD-10-CM

## 2022-07-07 DIAGNOSIS — Z20822 Contact with and (suspected) exposure to covid-19: Secondary | ICD-10-CM | POA: Diagnosis not present

## 2022-07-07 DIAGNOSIS — F0390 Unspecified dementia without behavioral disturbance: Secondary | ICD-10-CM | POA: Insufficient documentation

## 2022-07-07 DIAGNOSIS — R0602 Shortness of breath: Secondary | ICD-10-CM | POA: Diagnosis present

## 2022-07-07 LAB — CBC WITH DIFFERENTIAL/PLATELET
Abs Immature Granulocytes: 0.18 10*3/uL — ABNORMAL HIGH (ref 0.00–0.07)
Basophils Absolute: 0.1 10*3/uL (ref 0.0–0.1)
Basophils Relative: 1 %
Eosinophils Absolute: 0.2 10*3/uL (ref 0.0–0.5)
Eosinophils Relative: 2 %
HCT: 38 % (ref 36.0–46.0)
Hemoglobin: 12.3 g/dL (ref 12.0–15.0)
Immature Granulocytes: 2 %
Lymphocytes Relative: 12 %
Lymphs Abs: 1 10*3/uL (ref 0.7–4.0)
MCH: 30.1 pg (ref 26.0–34.0)
MCHC: 32.4 g/dL (ref 30.0–36.0)
MCV: 92.9 fL (ref 80.0–100.0)
Monocytes Absolute: 0.5 10*3/uL (ref 0.1–1.0)
Monocytes Relative: 6 %
Neutro Abs: 6.6 10*3/uL (ref 1.7–7.7)
Neutrophils Relative %: 77 %
Platelets: 156 10*3/uL (ref 150–400)
RBC: 4.09 MIL/uL (ref 3.87–5.11)
RDW: 12.8 % (ref 11.5–15.5)
WBC: 8.5 10*3/uL (ref 4.0–10.5)
nRBC: 0 % (ref 0.0–0.2)

## 2022-07-07 LAB — COMPREHENSIVE METABOLIC PANEL
ALT: 20 U/L (ref 0–44)
AST: 30 U/L (ref 15–41)
Albumin: 3.6 g/dL (ref 3.5–5.0)
Alkaline Phosphatase: 83 U/L (ref 38–126)
Anion gap: 10 (ref 5–15)
BUN: 21 mg/dL (ref 8–23)
CO2: 21 mmol/L — ABNORMAL LOW (ref 22–32)
Calcium: 8.7 mg/dL — ABNORMAL LOW (ref 8.9–10.3)
Chloride: 97 mmol/L — ABNORMAL LOW (ref 98–111)
Creatinine, Ser: 1.22 mg/dL — ABNORMAL HIGH (ref 0.44–1.00)
GFR, Estimated: 50 mL/min — ABNORMAL LOW (ref >60)
Glucose, Bld: 176 mg/dL — ABNORMAL HIGH (ref 70–99)
Potassium: 4.6 mmol/L (ref 3.5–5.1)
Sodium: 128 mmol/L — ABNORMAL LOW (ref 135–145)
Total Bilirubin: 0.8 mg/dL (ref 0.3–1.2)
Total Protein: 6.2 g/dL — ABNORMAL LOW (ref 6.5–8.1)

## 2022-07-07 LAB — BRAIN NATRIURETIC PEPTIDE: B Natriuretic Peptide: 44.7 pg/mL (ref 0.0–100.0)

## 2022-07-07 LAB — RESP PANEL BY RT-PCR (FLU A&B, COVID) ARPGX2
Influenza A by PCR: NEGATIVE
Influenza B by PCR: NEGATIVE
SARS Coronavirus 2 by RT PCR: NEGATIVE

## 2022-07-07 LAB — TROPONIN I (HIGH SENSITIVITY): Troponin I (High Sensitivity): 63 ng/L — ABNORMAL HIGH (ref <18)

## 2022-07-07 LAB — LIPASE, BLOOD: Lipase: 40 U/L (ref 11–51)

## 2022-07-07 MED ORDER — ALBUTEROL SULFATE (2.5 MG/3ML) 0.083% IN NEBU
5.0000 mg | INHALATION_SOLUTION | Freq: Once | RESPIRATORY_TRACT | Status: AC
  Administered 2022-07-07: 5 mg via RESPIRATORY_TRACT
  Filled 2022-07-07: qty 6

## 2022-07-07 MED ORDER — STERILE WATER FOR INJECTION IJ SOLN
INTRAMUSCULAR | Status: AC
  Administered 2022-07-07: 2 mL
  Filled 2022-07-07: qty 10

## 2022-07-07 MED ORDER — METHYLPREDNISOLONE 4 MG PO TBPK: ORAL_TABLET | ORAL | 0 refills | Status: DC

## 2022-07-07 MED ORDER — METHYLPREDNISOLONE SODIUM SUCC 125 MG IJ SOLR
125.0000 mg | INTRAMUSCULAR | Status: AC
  Administered 2022-07-07: 125 mg via INTRAVENOUS
  Filled 2022-07-07: qty 2

## 2022-07-07 MED ORDER — DOXYCYCLINE HYCLATE 100 MG PO CAPS: 100.0000 mg | ORAL_CAPSULE | Freq: Two times a day (BID) | ORAL | 0 refills | Status: AC

## 2022-07-07 NOTE — ED Provider Notes (Signed)
[facility] Provider Note    Event Date/Time   First MD Initiated Contact with Patient 07/07/22 2100     (approximate)   History   Chief Complaint: Shortness of Breath   HPI  Tammy JAMERSON is a 61 y.o. female with a history of COPD on 2 L nasal cannula at all times, bipolar disorder, dementia, CHF who is brought to the ED by EMS from her group home due to shortness of breath that started after eating dinner.  Reportedly she was 86% on her usual nasal cannula oxygen.  EMS reportedly gave 1 DuoNeb.  Patient endorses shortness of breath, denies pain or fever.     Physical Exam   Triage Vital Signs: ED Triage Vitals  Enc Vitals Group     BP 07/07/22 2105 118/65     Pulse Rate 07/07/22 2103 73     Resp 07/07/22 2103 (!) 22     Temp 07/07/22 2105 (!) 97.5 F (36.4 C)     Temp Source 07/07/22 2103 Oral     SpO2 07/07/22 2103 91 %     Weight --      Height --      Head Circumference --      Peak Flow --      Pain Score 07/07/22 2102 0     Pain Loc --      Pain Edu? --      Excl. in [place]? --     Most recent vital signs: Vitals:   07/07/22 2105 07/07/22 2200  BP: 118/65 111/68  Pulse:  68  Resp:  19  Temp: (!) 97.5 F (36.4 C)   SpO2:  94%    General: Awake, no distress.  CV:  Good peripheral perfusion.  Regular rate and rhythm Resp:  Normal effort.  Prolonged expiratory phase with diffuse expiratory wheezing.  No focal crackles. Abd:  No distention.  Soft nontender Other:  Moist oral mucosa.  No lower extremity edema or calf tenderness.  No JVD   ED Results / Procedures / Treatments   Labs (all labs ordered are listed, but only abnormal results are displayed) Labs Reviewed  COMPREHENSIVE METABOLIC PANEL - Abnormal; Notable for the following components:      Result Value   Sodium 128 (*)    Chloride 97 (*)    CO2 21 (*)    Glucose, Bld 176 (*)    Creatinine, Ser 1.22 (*)    Calcium 8.7 (*)    Total Protein 6.2 (*)    GFR,  Estimated 50 (*)    All other components within normal limits  CBC WITH DIFFERENTIAL/PLATELET - Abnormal; Notable for the following components:   Abs Immature Granulocytes 0.18 (*)    All other components within normal limits  TROPONIN I (HIGH SENSITIVITY) - Abnormal; Notable for the following components:   Troponin I (High Sensitivity) 63 (*)    All other components within normal limits  RESP PANEL BY RT-PCR (FLU A&B, COVID) ARPGX2  LIPASE, BLOOD  BRAIN NATRIURETIC PEPTIDE     EKG Interpreted by me Sinus rhythm rate of 68.  Normal axis and intervals.  Poor R wave progression.  Normal ST segments and T waves.   RADIOLOGY Chest x-ray interpreted by me, no pleural effusion or focal infiltrates.  Radiology report reviewed noting findings of chronic interstitial edema   PROCEDURES:  Procedures   MEDICATIONS ORDERED IN ED: Medications  albuterol (PROVENTIL) (2.5 MG/3ML) 0.083% nebulizer solution 5 mg (5  mg Nebulization Given 07/07/22 2224)  methylPREDNISolone sodium succinate (SOLU-MEDROL) 125 mg/2 mL injection 125 mg (125 mg Intravenous Given 07/07/22 2221)  sterile water (preservative free) injection (2 mLs  Given 07/07/22 2242)     IMPRESSION / MDM / ASSESSMENT AND PLAN / ED COURSE  I reviewed the triage vital signs and the nursing notes.                              Differential diagnosis includes, but is not limited to, COPD exacerbation, pneumonia, pleural effusion.  Doubt ACS PE dissection or pericardial effusion.  Patient's presentation is most consistent with acute presentation with potential threat to life or bodily function.  Patient presents with shortness of breath, acute on chronic hypoxic failure requiring 4 L nasal cannula oxygen compared to her baseline of 2 L during prehospital care.  Clinically she is having a COPD exacerbation.  She was given additional nebs and Solu-Medrol in the ED.    ----------------------------------------- 11:25 PM on  07/07/2022 ----------------------------------------- On reassessment patient is feeling much better.  Lung exam is normalized, normal expiratory phase, no expiratory wheezing.  No inducible wheezing or cough with FEV1 maneuver.  Vital signs are normal and her oxygenation is 96% on her baseline 2 L nasal cannula.  Chest x-ray shows some interstitial edema, but I think this is a chronic finding.  BNP is essentially negative.  She has a mildly elevated troponin but symptoms and presentation are not consistent with cardiac pathology.  Will prescribe doxycycline and Medrol Dosepak given her allergy profile.  She is stable for discharge and follow-up with primary care.  I considered admission but due to her improvement and reassuring results, this is not indicated.       FINAL CLINICAL IMPRESSION(S) / ED DIAGNOSES   Final diagnoses:  COPD exacerbation (HCC)  Chronic respiratory failure with hypoxia (HCC)     Rx / DC Orders   ED Discharge Orders          Ordered    methylPREDNISolone (MEDROL DOSEPAK) 4 MG TBPK tablet        07/07/22 2322    doxycycline (VIBRAMYCIN) 100 MG capsule  2 times daily        07/07/22 2322             Note:  This document was prepared using Dragon voice recognition software and may include unintentional dictation errors.   Sharman Cheek, MD 07/07/22 2326

## 2022-07-07 NOTE — ED Notes (Signed)
Simona Huh from [facility] called and made aware that patient is ready for d/c

## 2022-07-07 NOTE — ED Notes (Signed)
Patient incontinent of stool.  Cleaned and new adult brief placed.  Warm blankets applied

## 2022-07-07 NOTE — ED Triage Notes (Signed)
Pt from group home per ems with shob, respiratory distress after dinner. Per ems staff reported pt was "blue and grey", 86% on oxygen with fire department per ems. Pt arrives pwd, no current resp distress noted. Pt with history of copd, ems gave duoneb.

## 2022-07-08 NOTE — ED Notes (Signed)
Caretaker called to get an update on patient and questioning when she would be d/c.  Informed caretaker that Simona Huh from [facility] had been informed that patient was ready to return and was given updated report.  Caretaker states "I know you said she was ready, but you didn't tell us what was wrong with her."  Informed caretaker that she would be provided paperwork and findings at time of discharge.  Caretaker asked where to find patient when she arrived and this RN informed her that patient would be waiting in room due to oxygen requirement and currently having a PureWick.  Understanding voice and caretaker reports she is on the way to get patient.

## 2022-07-08 NOTE — ED Notes (Signed)
Patient's group home staff member has arrived and is currently upset that patient "is not up and ready to go."  Informed care taker that patient was ready to go, that all lines and monitoring equipment had been removed.  External catheter was left in place to prevent patient from soiling herself.  Care taker demanding that her clothing be put back on.  When attempting to change patient out of gown, caretaker stopped this RN and stated "there is manure all over that.  It can be trashed."  Gown replaced and patient assisted to wheelchair.  Care taker trashed patient's personal dress and stated "I just got a new van, I don't need that on it."  RN questioned care taker about patient's personal oxygen tank and care taker reported that she "forgot it." Requesting to borrow one.  Care taker was informed that the hospital did not currently have a tank that could be loaned to them.  Care taker upset and agitated.  Wanting to leave without oxygen and RN informed care taker that it would not be advised that she leaves without oxygen when the patient had an episode of respiration distress tonight and required oxygen.  Care taker to call facility and have a tank brought for d/c

## 2022-07-08 NOTE — ED Notes (Signed)
Patient updated on plan of care.  Report give to second staff member from [place]

## 2022-07-08 NOTE — ED Notes (Signed)
Oxygen tank brought to ED by another care taker.  Key to turn on oxygen was left on facility.  Staff members to assist care takers and patient with home oxygen prior to d/c.  Caretaker demanding that patient be "cleaned and given a new pull up" prior to leaving.  Patient's adult brief changed by RN and patient cleaned by RN.  Caretaker remains agitated about situation.

## 2022-07-29 ENCOUNTER — Encounter: Payer: Self-pay | Admitting: Podiatry

## 2022-07-29 ENCOUNTER — Ambulatory Visit (INDEPENDENT_AMBULATORY_CARE_PROVIDER_SITE_OTHER): Payer: Medicare Other | Admitting: Podiatry

## 2022-07-29 DIAGNOSIS — E119 Type 2 diabetes mellitus without complications: Secondary | ICD-10-CM

## 2022-07-29 DIAGNOSIS — M79609 Pain in unspecified limb: Secondary | ICD-10-CM

## 2022-07-29 DIAGNOSIS — B351 Tinea unguium: Secondary | ICD-10-CM

## 2022-07-29 NOTE — Progress Notes (Signed)
This patient returns to my office for at risk foot care.  This patient requires this care by a professional since this patient will be at risk due to having diabetes.  This patient is unable to cut nails herself since the patient cannot reach her nails.These nails are painful walking and wearing shoes.  This patient presents for at risk foot care today.  General Appearance  Alert, conversant and in no acute stress.  Vascular  Dorsalis pedis and posterior tibial  pulses are palpable  bilaterally.  Capillary return is within normal limits  bilaterally. Temperature is within normal limits  bilaterally.  Neurologic  Senn-Weinstein monofilament wire test within normal limits  bilaterally. Muscle power within normal limits bilaterally.  Nails Thick disfigured discolored nails with subungual debris  from hallux to fifth toes bilaterally. No evidence of bacterial infection or drainage bilaterally.  Orthopedic  No limitations of motion  feet .  No crepitus or effusions noted.  No bony pathology or digital deformities noted. HAV  B/L.  Dorsal  DJD  B/L. Skin  normotropic skin with no porokeratosis noted bilaterally.  No signs of infections or ulcers noted.     Onychomycosis  Pain in right toes  Pain in left toes  Consent was obtained for treatment procedures.   Mechanical debridement of nails 1-5  bilaterally performed with a nail nipper.  Filed with dremel without incident.    Return office visit   3 months                  Told patient to return for periodic foot care and evaluation due to potential at risk complications.   Gardiner Barefoot DPM

## 2022-11-07 ENCOUNTER — Ambulatory Visit: Payer: Medicare Other | Admitting: Podiatry

## 2022-11-18 ENCOUNTER — Emergency Department
Admission: EM | Admit: 2022-11-18 | Discharge: 2022-11-18 | Disposition: A | Discharge status: Home / Self Care | Payer: Medicare Other | Attending: Emergency Medicine | Admitting: Emergency Medicine

## 2022-11-18 ENCOUNTER — Emergency Department: Payer: Medicare Other

## 2022-11-18 ENCOUNTER — Other Ambulatory Visit: Payer: Self-pay

## 2022-11-18 DIAGNOSIS — F0390 Unspecified dementia without behavioral disturbance: Secondary | ICD-10-CM | POA: Insufficient documentation

## 2022-11-18 DIAGNOSIS — I509 Heart failure, unspecified: Secondary | ICD-10-CM | POA: Insufficient documentation

## 2022-11-18 DIAGNOSIS — R0602 Shortness of breath: Secondary | ICD-10-CM

## 2022-11-18 DIAGNOSIS — U071 COVID-19: Secondary | ICD-10-CM | POA: Insufficient documentation

## 2022-11-18 DIAGNOSIS — J449 Chronic obstructive pulmonary disease, unspecified: Secondary | ICD-10-CM | POA: Insufficient documentation

## 2022-11-18 LAB — CBC WITH DIFFERENTIAL/PLATELET
Abs Immature Granulocytes: 0.11 10*3/uL — ABNORMAL HIGH (ref 0.00–0.07)
Basophils Absolute: 0 10*3/uL (ref 0.0–0.1)
Basophils Relative: 0 %
Eosinophils Absolute: 0 10*3/uL (ref 0.0–0.5)
Eosinophils Relative: 0 %
HCT: 40.2 % (ref 36.0–46.0)
Hemoglobin: 13 g/dL (ref 12.0–15.0)
Immature Granulocytes: 1 %
Lymphocytes Relative: 7 %
Lymphs Abs: 0.6 10*3/uL — ABNORMAL LOW (ref 0.7–4.0)
MCH: 30.4 pg (ref 26.0–34.0)
MCHC: 32.3 g/dL (ref 30.0–36.0)
MCV: 94.1 fL (ref 80.0–100.0)
Monocytes Absolute: 0.2 10*3/uL (ref 0.1–1.0)
Monocytes Relative: 2 %
Neutro Abs: 7.4 10*3/uL (ref 1.7–7.7)
Neutrophils Relative %: 90 %
Platelets: 340 10*3/uL (ref 150–400)
RBC: 4.27 MIL/uL (ref 3.87–5.11)
RDW: 13 % (ref 11.5–15.5)
WBC: 8.3 10*3/uL (ref 4.0–10.5)
nRBC: 0.2 % (ref 0.0–0.2)

## 2022-11-18 LAB — RESP PANEL BY RT-PCR (RSV, FLU A&B, COVID)  RVPGX2
Influenza A by PCR: NEGATIVE
Influenza B by PCR: NEGATIVE
Resp Syncytial Virus by PCR: NEGATIVE
SARS Coronavirus 2 by RT PCR: POSITIVE — AB

## 2022-11-18 LAB — BASIC METABOLIC PANEL
Anion gap: 10 (ref 5–15)
BUN: 15 mg/dL (ref 8–23)
CO2: 22 mmol/L (ref 22–32)
Calcium: 9.3 mg/dL (ref 8.9–10.3)
Chloride: 108 mmol/L (ref 98–111)
Creatinine, Ser: 1.19 mg/dL — ABNORMAL HIGH (ref 0.44–1.00)
GFR, Estimated: 52 mL/min — ABNORMAL LOW (ref >60)
Glucose, Bld: 144 mg/dL — ABNORMAL HIGH (ref 70–99)
Potassium: 5.3 mmol/L — ABNORMAL HIGH (ref 3.5–5.1)
Sodium: 140 mmol/L (ref 135–145)

## 2022-11-18 LAB — BLOOD GAS, VENOUS
Acid-Base Excess: 1 mmol/L (ref 0.0–2.0)
Bicarbonate: 25.2 mmol/L (ref 20.0–28.0)
O2 Saturation: 98.4 %
Patient temperature: 37
pCO2, Ven: 38 mmHg — ABNORMAL LOW (ref 44–60)
pH, Ven: 7.43 (ref 7.25–7.43)
pO2, Ven: 187 mmHg — ABNORMAL HIGH (ref 32–45)

## 2022-11-18 LAB — BRAIN NATRIURETIC PEPTIDE: B Natriuretic Peptide: 48.5 pg/mL (ref 0.0–100.0)

## 2022-11-18 LAB — TROPONIN I (HIGH SENSITIVITY)
Troponin I (High Sensitivity): 10 ng/L (ref <18)
Troponin I (High Sensitivity): 11 ng/L (ref <18)

## 2022-11-18 MED ORDER — IPRATROPIUM-ALBUTEROL 0.5-2.5 (3) MG/3ML IN SOLN
3.0000 mL | Freq: Once | RESPIRATORY_TRACT | Status: AC
  Administered 2022-11-18: 3 mL via RESPIRATORY_TRACT
  Filled 2022-11-18: qty 3

## 2022-11-18 NOTE — ED Notes (Signed)
RN attempted x2 for straight stick blood work, unsuccessful attempts. Pt EMS IV does not pull back, but flushes well.

## 2022-11-18 NOTE — ED Provider Notes (Signed)
[facility] Provider Note    Event Date/Time   First MD Initiated Contact with Patient 11/18/22 1008     (approximate)   History   Shortness of Breath   HPI  Tammy [name] is a 62 y.o. female   Past medical history of dementia, bipolar, seizures, COPD, CHF presents to the emergency department with report from her group home that she seemed to be having difficulty with her breathing.  Reportedly was COVID-positive recently and recovering.  Fire department initially had her on nonrebreather for reported hypoxemia in the 60s.  She reportedly wears 2 L nasal cannula baseline for her COPD.  EMS put her back on her 2 L nasal cannula and oxygen saturations in the low 90%.  No respiratory distress or wheezing reported by EMS, and she did not receive any treatment en route to the emergency department.  Patient offers no acute medical complaints, is comfortable, is disoriented to situation.  She is in no respiratory distress on my initial evaluation  Independent Historian contributed to assessment above: EMS  External Medical Documents Reviewed: Emergency department note dated October 2023 for shortness of breath from her group home diagnosed with COPD exacerbation and responded well to treatment in the emergency department, subsequently discharged.      Physical Exam   Triage Vital Signs: ED Triage Vitals [11/18/22 1014]  Enc Vitals Group     BP      Pulse      Resp      Temp      Temp src      SpO2      Weight 179 lb 14.3 oz (81.6 kg)     Height 5' 2"$  (1.575 m)     Head Circumference      Peak Flow      Pain Score 3     Pain Loc      Pain Edu?      Excl. in [place]?     Most recent vital signs: Vitals:   11/18/22 1014 11/18/22 1512  BP: 101/66 (!) 148/76  Pulse: 82 83  Resp: 20 18  Temp: 98 F (36.7 C) 97.6 F (36.4 C)  SpO2: 90% 92%    General: Awake, no distress.  CV:  Good peripheral perfusion.  Resp:  Normal effort.  Abd:  No  distention.  Other:  Alert comfortable respiratory unlabored no obvious wheezing or focality, somewhat distant lung sounds, abdomen soft nontender appears euvolemic nontoxic comfortable   ED Results / Procedures / Treatments   Labs (all labs ordered are listed, but only abnormal results are displayed) Labs Reviewed  RESP PANEL BY RT-PCR (RSV, FLU A&B, COVID)  RVPGX2 - Abnormal; Notable for the following components:      Result Value   SARS Coronavirus 2 by RT PCR POSITIVE (*)    All other components within normal limits  BASIC METABOLIC PANEL - Abnormal; Notable for the following components:   Potassium 5.3 (*)    Glucose, Bld 144 (*)    Creatinine, Ser 1.19 (*)    GFR, Estimated 52 (*)    All other components within normal limits  BLOOD GAS, VENOUS - Abnormal; Notable for the following components:   pCO2, Ven 38 (*)    pO2, Ven 187 (*)    All other components within normal limits  CBC WITH DIFFERENTIAL/PLATELET - Abnormal; Notable for the following components:   Lymphs Abs 0.6 (*)    Abs Immature Granulocytes 0.11 (*)  All other components within normal limits  BRAIN NATRIURETIC PEPTIDE  CBC WITH DIFFERENTIAL/PLATELET  TROPONIN I (HIGH SENSITIVITY)  TROPONIN I (HIGH SENSITIVITY)     I ordered and reviewed the above labs they are notable for covid+  EKG  ED ECG REPORT I, Lucillie Garfinkel, the attending physician, personally viewed and interpreted this ECG.   Date: 11/18/2022  EKG Time: 1014  Rate: 83  Rhythm: normal sinus rhythm  Axis: nl  Intervals:none  ST&T Change: no Ischemic changes    RADIOLOGY I independently reviewed and interpreted chest x-ray and see no obvious focalities or pneumothorax   PROCEDURES:  Critical Care performed: No  Procedures   MEDICATIONS ORDERED IN ED: Medications  ipratropium-albuterol (DUONEB) 0.5-2.5 (3) MG/3ML nebulizer solution 3 mL (3 mLs Nebulization Given 11/18/22 1053)     IMPRESSION / MDM / ASSESSMENT AND PLAN / ED  COURSE  I reviewed the triage vital signs and the nursing notes.                                Patient's presentation is most consistent with acute presentation with potential threat to life or bodily function.  Differential diagnosis includes, but is not limited to, viral URI, bacterial pneumonia, COPD exacerbation, heart failure exacerbation, ACS, PE   The patient is on the cardiac monitor to evaluate for evidence of arrhythmia and/or significant heart rate changes.  MDM: History of COPD and CHF and reportedly recently COVID-positive who presents with reported respiratory symptoms though the patient denies any symptoms currently and appears well on her baseline O2.  Will obtain EKG, troponins basic labs and respiratory infectious workup as well as a VBG.  Maintain on cardiopulmonary monitoring.  Give DuoNeb for history of COPD, no obvious wheezing but distant lung sounds on my examination  Given no sx and normal vital signs, no complaints from patient, I doubt ACS or PE  I considered hospitalization for admission or observation since the patient has been completely asymptomatic and no hypoxemia on her home O2 and normal hemodynamics with negative workup as above, she can be discharged at this time with outpatient follow-up.        FINAL CLINICAL IMPRESSION(S) / ED DIAGNOSES   Final diagnoses:  SOB (shortness of breath)     Rx / DC Orders   ED Discharge Orders     None        Note:  This document was prepared using Dragon voice recognition software and may include unintentional dictation errors.    Lucillie Garfinkel, MD 11/18/22 [phone]

## 2022-11-18 NOTE — ED Notes (Signed)
RN called lab to attempt to draw patient labs.

## 2022-11-18 NOTE — Discharge Instructions (Signed)
Thank you for choosing [place] for your health care today!  Please see your primary doctor this week for a follow up appointment.   Sometimes, in the early stages of certain disease courses it is difficult to detect in the emergency department evaluation -- so, it is important that you continue to monitor your symptoms and call your doctor right away or return to the emergency department if you develop any new or worsening symptoms.  Please go to the following website to schedule new (and existing) patient appointments:   [email or web address]  If you do not have a primary doctor try calling the following clinics to establish care:  If you have insurance:  [facility] [phone] [place] [address]   [facility]  [phone] [place] [place] [address]   If you do not have insurance:  [facility]  [phone] [address] [place] [address]   The following is another list of primary care offices in the area who are accepting new patients at this time.  Please reach out to one of them directly and let them know you would like to schedule an appointment to follow up on an Emergency Department visit, and/or to establish a new primary care provider (PCP).  There are likely other primary care clinics in the are who are accepting new patients, but this is an excellent place to start:  [place] physician: Dr Lavon Paganini [address] [place], [place] [address] [phone]  [facility] Lead Physician: Dr Steele Sizer [address], [place], [place] [address] [phone]  [place] Physician: Dr Park Liter [address] [place], [place] [address] [phone]  [facility] Lead Physician: Dr Dewaine Oats [place], [place], [place] [address] [phone]  [facility] [place] Physician: Dr Halina Maidens [address] Broach [place], [place] [address] [phone]   It was my pleasure to care for you today.   Hoover Brunette Jacelyn Grip, MD

## 2022-11-18 NOTE — ED Triage Notes (Signed)
Pt arrived via EMS for shortness of breath. Pt has hx of bipolar, schizophrenia, and dementia. EMS states the fire dept had pt on a non rebreather upon arrival d/t O2 saturation being in the 60's. Pt  had covid a few weeks ago and had some medication changes and is taking prednisone. Pt normally on 2LNC. Pt denies CP and endorses abd pain. Pt states she does not know why she is here. Pt A&Ox3.

## 2022-11-18 NOTE — ED Notes (Signed)
Pt refused vitals at discharge.

## 2022-11-29 ENCOUNTER — Ambulatory Visit: Payer: Medicare Other | Admitting: Podiatry

## 2022-12-23 ENCOUNTER — Ambulatory Visit (INDEPENDENT_AMBULATORY_CARE_PROVIDER_SITE_OTHER): Payer: Medicare Other | Admitting: Podiatry

## 2022-12-23 DIAGNOSIS — B351 Tinea unguium: Secondary | ICD-10-CM | POA: Diagnosis not present

## 2022-12-23 DIAGNOSIS — M79609 Pain in unspecified limb: Secondary | ICD-10-CM

## 2022-12-24 NOTE — Progress Notes (Signed)
  Subjective:  Patient ID: Tammy [name], female    DOB: 11-03-1960,  MRN: [id number]  Chief Complaint  Patient presents with   Nail Problem    Thick painful toenails, 5 month follow up    62 y.o. female presents with the above complaint. History confirmed with patient.  Her nails are thickened elongated causing pain again.  Previous debridements have been helpful.  Objective:  Physical Exam: warm, good capillary refill, no trophic changes or ulcerative lesions, normal DP and PT pulses, and normal sensory exam. Left Foot: dystrophic yellowed discolored nail plates with subungual debris Right Foot: dystrophic yellowed discolored nail plates with subungual debris    Assessment:   1. Pain due to onychomycosis of nail      Plan:  Patient was evaluated and treated and all questions answered.  Discussed the etiology and treatment options for the condition in detail with the patient. Educated patient on the topical and oral treatment options for mycotic nails. Recommended debridement of the nails today. Sharp and mechanical debridement performed of all painful and mycotic nails today. Nails debrided in length and thickness using a nail nipper to level of comfort. Discussed treatment options including appropriate shoe gear. Follow up as needed for painful nails.    Return in about 3 months (around 03/25/2023) for painful thick fungal nails.

## 2023-01-01 ENCOUNTER — Other Ambulatory Visit: Payer: Self-pay | Admitting: Internal Medicine

## 2023-01-01 DIAGNOSIS — Z1231 Encounter for screening mammogram for malignant neoplasm of breast: Secondary | ICD-10-CM

## 2023-01-03 ENCOUNTER — Ambulatory Visit
Admission: EM | Admit: 2023-01-03 | Discharge: 2023-01-03 | Disposition: A | Discharge status: Home / Self Care | Payer: Medicare Other | Attending: Urgent Care | Admitting: Urgent Care

## 2023-01-03 ENCOUNTER — Ambulatory Visit: Payer: Self-pay

## 2023-01-03 DIAGNOSIS — N3001 Acute cystitis with hematuria: Secondary | ICD-10-CM | POA: Diagnosis present

## 2023-01-03 DIAGNOSIS — B3731 Acute candidiasis of vulva and vagina: Secondary | ICD-10-CM | POA: Insufficient documentation

## 2023-01-03 DIAGNOSIS — R300 Dysuria: Secondary | ICD-10-CM

## 2023-01-03 LAB — POCT URINALYSIS DIP (MANUAL ENTRY)
Bilirubin, UA: NEGATIVE
Glucose, UA: 500 mg/dL — AB
Ketones, POC UA: NEGATIVE mg/dL
Nitrite, UA: NEGATIVE
Protein Ur, POC: NEGATIVE mg/dL
Spec Grav, UA: 1.01 (ref 1.010–1.025)
Urobilinogen, UA: 1 E.U./dL
pH, UA: 6 (ref 5.0–8.0)

## 2023-01-03 MED ORDER — CEPHALEXIN 500 MG PO CAPS: 500.0000 mg | ORAL_CAPSULE | Freq: Four times a day (QID) | ORAL | 0 refills | Status: AC

## 2023-01-03 MED ORDER — CEPHALEXIN 500 MG PO CAPS: 500.0000 mg | ORAL_CAPSULE | Freq: Four times a day (QID) | ORAL | 0 refills | Status: DC

## 2023-01-03 MED ORDER — FLUCONAZOLE 150 MG PO TABS: 150.0000 mg | ORAL_TABLET | Freq: Once | ORAL | 0 refills | Status: AC

## 2023-01-03 NOTE — Discharge Instructions (Addendum)
Follow up here or with your primary care provider if your symptoms are worsening or not improving with treatment.     

## 2023-01-03 NOTE — ED Provider Notes (Addendum)
[facility]    CSN: [id number] Arrival date & time: 01/03/23  1845      History   Chief Complaint No chief complaint on file.   HPI Tammy [name] is a 62 y.o. female.   HPI  Presents to urgent care with concern for urinary frequency x 2 days.  She endorses itching and burning when trying to urinate, increased urinary frequency, lower abdominal cramping.  Denies fever.  Denies flank pain.  PMH includes DM 2, CKD, GFR greater than 50.  She is accompanied by personnel from her assisted living.  Past Medical History:  Diagnosis Date   Anxiety    Asthma    Bipolar 1 disorder (HCC)    CHF (congestive heart failure) (HCC)    COPD (chronic obstructive pulmonary disease) (HCC)    Dementia (HCC)    Epilepsy ([place])    Seizures ([place])     Patient Active Problem List   Diagnosis Date Noted   Seizure-like activity ([place]) 08/03/2019   B12 deficiency 06/23/2019   Iron deficiency 06/21/2019   History of seizure 04/07/2019   Seizure ([place]) 03/22/2019   Normocytic anemia 03/18/2019   Influenza A 11/10/2018   Hyponatremia 10/26/2018   Syncopal episodes 06/23/2017   Acute on chronic respiratory failure with hypoxia ([place]) 03/15/2017   COPD with acute exacerbation ([place]) 03/15/2017   Acute on chronic respiratory failure ([place]) 03/15/2017    Past Surgical History:  Procedure Laterality Date   APPENDECTOMY     hernia reapir     x 3      OB History   No obstetric history on file.      Home Medications    Prior to Admission medications   Medication Sig Start Date End Date Taking? Authorizing Provider  acetaminophen (TYLENOL) 500 MG tablet Take 500-1,000 mg by mouth daily as needed for mild pain, moderate pain or fever.     [provider]  busPIRone (BUSPAR) 30 MG tablet Take 30 mg by mouth 2 (two) times daily.     [provider]  carvedilol (COREG) 3.125 MG tablet Take 3.125 mg by mouth 2 (two) times daily with a meal.     [provider]  cetirizine (ZYRTEC) 10 MG tablet Take 10 mg by mouth daily.    [provider]  cyanocobalamin (,VITAMIN B-12,) 1000 MCG/ML injection 1000 mcg IM Once a month Patient taking differently: Inject 1,000 mcg into the muscle every 30 (thirty) days. 03/18/19   Borders, Kirt Boys, NP  Cyanocobalamin 1000 MCG SUBL Place 1 tablet (1,000 mcg total) under the tongue daily. 07/01/19   Cammie Sickle, MD  empagliflozin (JARDIANCE) 25 MG TABS tablet Take 25 mg by mouth daily.     [provider]  ferrous sulfate 325 (65 FE) MG EC tablet Take 1 tablet (325 mg total) by mouth daily with breakfast. 08/20/19   Cammie Sickle, MD  fluconazole (DIFLUCAN) 150 MG tablet Take 150 mg by mouth once. 10/11/20   [provider]  fluticasone (FLONASE) 50 MCG/ACT nasal spray Place 1 spray into both nostrils daily.     [provider]  HYDROcodone-acetaminophen (NORCO/VICODIN) 5-325 MG tablet Take 1 tablet by mouth every 6 (six) hours as needed for moderate pain. 12/27/19   Lavonia Drafts, MD  lamoTRIgine (LAMICTAL) 150 MG tablet Take 150 mg by mouth 2 (two) times daily.    [provider]  levETIRAcetam (KEPPRA) 250 MG tablet Take 250 mg by mouth 2 (two) times  daily.    [provider]  levothyroxine (SYNTHROID) 112 MCG tablet Take 112 mcg by mouth daily. 11/29/21   [provider]  levothyroxine (SYNTHROID, LEVOTHROID) 100 MCG tablet Take 100 mcg by mouth daily before breakfast.     [provider]  lithium carbonate 150 MG capsule Take 150 mg by mouth daily.     [provider]  metFORMIN (GLUCOPHAGE) 500 MG tablet Take 500 mg by mouth 2 (two) times daily with a meal.    [provider]  methylPREDNISolone (MEDROL DOSEPAK) 4 MG TBPK tablet Take dose pack according to package directions 07/07/22   Carrie Mew, MD  metroNIDAZOLE (FLAGYL) 500 MG tablet Take 500 mg by mouth 2 (two) times daily. 10/11/20   [provider]  Omega-3 Fatty Acids (FISH OIL) 1000 MG CAPS Take 1,000 mg by mouth 2 (two) times daily.     [provider]  pantoprazole (PROTONIX) 40 MG tablet Take 1 tablet by mouth daily. 10/30/20   [provider]  pravastatin (PRAVACHOL) 40 MG tablet Take 40 mg by mouth daily.    [provider]  risperiDONE (RISPERDAL) 3 MG tablet Take 3 mg by mouth at bedtime.     [provider]  rOPINIRole (REQUIP) 1 MG tablet Take 1 mg by mouth at bedtime.    [provider]  tamsulosin (FLOMAX) 0.4 MG CAPS capsule Take 1 capsule (0.4 mg total) by mouth daily. 03/25/17   McGowan, Shannon A, PA-C  TRELEGY ELLIPTA 100-62.5-25 MCG/INH AEPB Inhale 1 puff into the lungs daily.  11/17/18   [provider]  Vitamin D, Ergocalciferol, (DRISDOL) 1.25 MG (50000 UT) CAPS capsule Take 50,000 Units by mouth every 7 (seven) days.    [provider]    Family History Family History  Problem Relation Age of Onset   Hypertension Mother    Kidney cancer Neg Hx    Bladder Cancer Neg Hx    Breast cancer Neg Hx     Social History Social History   Tobacco Use   Smoking status: Former    Types: Cigarettes    Quit date: 10/07/1998    Years since quitting: 24.2   Smokeless tobacco: Never  Vaping Use   Vaping Use: Never used  Substance Use Topics   Alcohol use: No   Drug use: No     Allergies   Penicillins and Prednisone   Review of Systems Review of Systems   Physical Exam Triage Vital Signs ED Triage Vitals  Enc Vitals Group     BP      Pulse      Resp      Temp      Temp src      SpO2      Weight      Height      Head Circumference      Peak Flow      Pain Score      Pain Loc      Pain Edu?      Excl. in [place]?    No data found.  Updated Vital Signs There were no vitals taken for this visit.  Visual Acuity Right Eye Distance:   Left Eye Distance:   Bilateral Distance:    Right Eye Near:   Left Eye Near:    Bilateral  Near:     Physical Exam Vitals reviewed.  Constitutional:      Appearance: Normal appearance.  Skin:    General:  Skin is warm and dry.  Neurological:     General: No focal deficit present.     Mental Status: She is alert and oriented to person, place, and time.  Psychiatric:        Mood and Affect: Mood normal.        Behavior: Behavior normal.      UC Treatments / Results  Labs (all labs ordered are listed, but only abnormal results are displayed) Labs Reviewed - No data to display  EKG   Radiology No results found.  Procedures Procedures (including critical care time)  Medications Ordered in UC Medications - No data to display  Initial Impression / Assessment and Plan / UC Course  I have reviewed the triage vital signs and the nursing notes.  Pertinent labs & imaging results that were available during my care of the patient were reviewed by me and considered in my medical decision making (see chart for details).   UA is suggestive of UTI with Glucose, small leukocytes, trace blood, negative nitrites.  Treating with cephalexin which she has previously tolerated.  UC was ordered to confirm diagnosis and susceptibility.   Final Clinical Impressions(s) / UC Diagnoses   Final diagnoses:  None   Discharge Instructions   None    ED Prescriptions   None    PDMP not reviewed this encounter.   Rose Phi, FNP 01/03/23 1939    Rose Phi, [place] 01/03/23 1946    Rose Phi, [place] 01/03/23 1952

## 2023-01-03 NOTE — ED Triage Notes (Signed)
Pt states she is having itching and burning when trying to urinate, increase frequency, lower abdominal cramping, with small amount of blood after urinate. That started Wednesday.

## 2023-01-05 LAB — URINE CULTURE

## 2023-01-23 ENCOUNTER — Other Ambulatory Visit: Payer: Self-pay

## 2023-01-23 ENCOUNTER — Emergency Department: Payer: Medicare Other

## 2023-01-23 ENCOUNTER — Emergency Department
Admission: EM | Admit: 2023-01-23 | Discharge: 2023-01-23 | Disposition: A | Discharge status: Other Acute Inpatient Hospital | Payer: Medicare Other | Attending: Emergency Medicine | Admitting: Emergency Medicine

## 2023-01-23 DIAGNOSIS — I509 Heart failure, unspecified: Secondary | ICD-10-CM | POA: Insufficient documentation

## 2023-01-23 DIAGNOSIS — J441 Chronic obstructive pulmonary disease with (acute) exacerbation: Secondary | ICD-10-CM

## 2023-01-23 DIAGNOSIS — F0390 Unspecified dementia without behavioral disturbance: Secondary | ICD-10-CM | POA: Insufficient documentation

## 2023-01-23 DIAGNOSIS — R059 Cough, unspecified: Secondary | ICD-10-CM | POA: Diagnosis present

## 2023-01-23 LAB — CBC
HCT: 43.1 % (ref 36.0–46.0)
Hemoglobin: 13.8 g/dL (ref 12.0–15.0)
MCH: 30.3 pg (ref 26.0–34.0)
MCHC: 32 g/dL (ref 30.0–36.0)
MCV: 94.7 fL (ref 80.0–100.0)
Platelets: 212 10*3/uL (ref 150–400)
RBC: 4.55 MIL/uL (ref 3.87–5.11)
RDW: 13.5 % (ref 11.5–15.5)
WBC: 6.2 10*3/uL (ref 4.0–10.5)
nRBC: 0 % (ref 0.0–0.2)

## 2023-01-23 LAB — BASIC METABOLIC PANEL
Anion gap: 7 (ref 5–15)
BUN: 15 mg/dL (ref 8–23)
CO2: 23 mmol/L (ref 22–32)
Calcium: 9.2 mg/dL (ref 8.9–10.3)
Chloride: 107 mmol/L (ref 98–111)
Creatinine, Ser: 1.09 mg/dL — ABNORMAL HIGH (ref 0.44–1.00)
GFR, Estimated: 58 mL/min — ABNORMAL LOW (ref >60)
Glucose, Bld: 156 mg/dL — ABNORMAL HIGH (ref 70–99)
Potassium: 4.2 mmol/L (ref 3.5–5.1)
Sodium: 137 mmol/L (ref 135–145)

## 2023-01-23 MED ORDER — DEXAMETHASONE 4 MG PO TABS
10.0000 mg | ORAL_TABLET | Freq: Once | ORAL | Status: AC
  Administered 2023-01-23: 10 mg via ORAL
  Filled 2023-01-23: qty 3

## 2023-01-23 MED ORDER — IPRATROPIUM-ALBUTEROL 0.5-2.5 (3) MG/3ML IN SOLN
9.0000 mL | Freq: Once | RESPIRATORY_TRACT | Status: AC
  Administered 2023-01-23: 9 mL via RESPIRATORY_TRACT
  Filled 2023-01-23: qty 3

## 2023-01-23 MED ORDER — PREDNISONE 20 MG PO TABS: 60.0000 mg | ORAL_TABLET | Freq: Once | ORAL | Status: DC

## 2023-01-23 MED ORDER — DOXYCYCLINE HYCLATE 50 MG PO CAPS: 100.0000 mg | ORAL_CAPSULE | Freq: Two times a day (BID) | ORAL | 0 refills | Status: AC

## 2023-01-23 MED ORDER — DOXYCYCLINE HYCLATE 100 MG PO TABS
100.0000 mg | ORAL_TABLET | Freq: Once | ORAL | Status: AC
  Administered 2023-01-23: 100 mg via ORAL
  Filled 2023-01-23: qty 1

## 2023-01-23 NOTE — ED Provider Notes (Signed)
[facility] Provider Note    Event Date/Time   First MD Initiated Contact with Patient 01/23/23 1748     (approximate)   History   Cough and Shortness of Breath   HPI  Tammy [name] is a 62 y.o. female   Past medical history of COPD on 2 L nasal cannula at baseline, CHF, bipolar, dementia, epilepsy, anxiety and asthma from a group home who presents with 3 days of productive cough, subjective fevers, shortness of breath.  Denies chest pain.  Poor p.o. intake due to loss of appetite.  No other acute medical complaints.  External Medical Documents Reviewed: Emergency department visit in February 2024 when she was seen for shortness of breath and cough responded to [facility]      Physical Exam   Triage Vital Signs: ED Triage Vitals  Enc Vitals Group     BP 01/23/23 1616 121/60     Pulse Rate 01/23/23 1616 77     Resp 01/23/23 1619 20     Temp 01/23/23 1617 98.5 F (36.9 C)     Temp src --      SpO2 01/23/23 1616 95 %     Weight 01/23/23 1617 155 lb (70.3 kg)     Height --      Head Circumference --      Peak Flow --      Pain Score 01/23/23 1617 3     Pain Loc --      Pain Edu? --      Excl. in [place]? --     Most recent vital signs: Vitals:   01/23/23 1617 01/23/23 1619  BP:    Pulse:    Resp:  20  Temp: 98.5 F (36.9 C)   SpO2:      General: Awake, no distress.  CV:  Good peripheral perfusion.  Resp:  Normal effort.  Abd:  No distention.  Other:  Scant wheezing at bilateral apices, no focality, no fever, normal vital signs and normal oxygen levels on her 2 L baseline O2.  No peripheral edema.   ED Results / Procedures / Treatments   Labs (all labs ordered are listed, but only abnormal results are displayed) Labs Reviewed  BASIC METABOLIC PANEL - Abnormal; Notable for the following components:      Result Value   Glucose, Bld 156 (*)    Creatinine, Ser 1.09 (*)    GFR, Estimated 58 (*)    All other components within  normal limits  CBC     I ordered and reviewed the above labs they are notable for white blood cell count is normal  EKG  ED ECG REPORT I, Pilar Jarvis, the attending physician, personally viewed and interpreted this ECG.   Date: 01/23/2023  EKG Time: 1620  Rate: 79  Rhythm: sinus  Axis: nl  Intervals:none  ST&T Change: No acute ischemic changes    RADIOLOGY I independently reviewed and interpreted no obvious focalities or pneumothorax on chest x-ray   PROCEDURES:  Critical Care performed: No  Procedures   MEDICATIONS ORDERED IN ED: Medications  ipratropium-albuterol (DUONEB) 0.5-2.5 (3) MG/3ML nebulizer solution 9 mL (has no administration in time range)  doxycycline (VIBRA-TABS) tablet 100 mg (has no administration in time range)  dexamethasone (DECADRON) tablet 10 mg (has no administration in time range)    IMPRESSION / MDM / ASSESSMENT AND PLAN / ED COURSE  I reviewed the triage vital signs and the nursing notes.  Patient's presentation is most consistent with acute presentation with potential threat to life or bodily function.  Differential diagnosis includes, but is not limited to, COPD exacerbation, CHF exacerbation, viral URI, bacterial pneumonia   The patient is on the cardiac monitor to evaluate for evidence of arrhythmia and/or significant heart rate changes.  MDM: This is a patient with COPD with what appears to be COPD exacerbation with wheezing, cough, productive sputum, but fortunately no hypoxemia no respiratory distress no fever and normal white blood cell count.  Findings suspicious of atypical pneumonia infection on chest x-ray.  Will cover with doxycycline.  Give DuoNebs and steroids.  I considered hospitalization for admission or observation however given stability in the emergency department with normal vital signs, no respiratory distress I think outpatient treatment and follow-up most appropriate.  Return  precautions given.        FINAL CLINICAL IMPRESSION(S) / ED DIAGNOSES   Final diagnoses:  COPD exacerbation     Rx / DC Orders   ED Discharge Orders          Ordered    doxycycline (VIBRAMYCIN) 50 MG capsule  2 times daily        01/23/23 1808             Note:  This document was prepared using Dragon voice recognition software and may include unintentional dictation errors.    Pilar Jarvis, MD 01/23/23 [phone]

## 2023-01-23 NOTE — ED Triage Notes (Signed)
Pt to ED [facility] from community care group home for cough. Covid a few weeks ago, COPD.  Wears 2 L [place] at home Hx dementia  Pt reports she feels like she is having trouble breathing. Pt RR even and unlabored. Speaking in complete sentences. Productive cough noted.  Also endorses abd pain

## 2023-01-23 NOTE — ED Notes (Signed)
[facility] called for transport to pt's home  

## 2023-01-23 NOTE — ED Notes (Signed)
Called the group home to ensure of pt's transport and to give report.  They stated that they will be able to pick up the pt in a few minutes.  Report was given, awaiting their arrival

## 2023-01-23 NOTE — Discharge Instructions (Addendum)
Doxycycline for 5 days as prescribed for pneumonia.  Use your albuterol inhaler 2 puffs every 4-6 hours as needed for shortness of breath and wheezing.  See your doctor this week for recheck.  If you have any new or worsening symptoms come back to the emergency department.

## 2023-01-23 NOTE — ED Notes (Signed)
Member from group home did come and take the pt.  The d/c instructions were given to the representative with good understanding

## 2023-01-23 NOTE — ED Notes (Signed)
Pt states that she needs EMS transport. Secretary is calling EMS for transport at this time

## 2023-05-07 ENCOUNTER — Ambulatory Visit (INDEPENDENT_AMBULATORY_CARE_PROVIDER_SITE_OTHER): Payer: Medicare Other | Admitting: Podiatry

## 2023-05-07 ENCOUNTER — Encounter: Payer: Self-pay | Admitting: Podiatry

## 2023-05-07 DIAGNOSIS — B351 Tinea unguium: Secondary | ICD-10-CM | POA: Diagnosis not present

## 2023-05-07 DIAGNOSIS — M79609 Pain in unspecified limb: Secondary | ICD-10-CM | POA: Diagnosis not present

## 2023-05-07 DIAGNOSIS — E119 Type 2 diabetes mellitus without complications: Secondary | ICD-10-CM

## 2023-05-07 NOTE — Progress Notes (Signed)
She presents today chief complaint of painful elongated toenails.  She presents with someone from her care facility.  Objective: Vital signs are stable she is alert and oriented x 3 her feet really look perfect her toenails are just long and tender on palpation as well as debridement.  Pulses are palpable capillary fill time is immediate no open lesions or wounds.  Assessment: Pain in limb secondary to onychomycosis.  Plan: She will follow-up with Dr. Donzetta Matters in 3 months for routine nail debridement.  We debrided nails today 1 through 5 bilateral

## 2023-08-04 ENCOUNTER — Ambulatory Visit (INDEPENDENT_AMBULATORY_CARE_PROVIDER_SITE_OTHER): Payer: Medicare Other | Admitting: Podiatry

## 2023-08-04 ENCOUNTER — Encounter: Payer: Self-pay | Admitting: Podiatry

## 2023-08-04 VITALS — Ht 62.0 in | Wt 155.0 lb

## 2023-08-04 DIAGNOSIS — B351 Tinea unguium: Secondary | ICD-10-CM | POA: Diagnosis not present

## 2023-08-04 DIAGNOSIS — E119 Type 2 diabetes mellitus without complications: Secondary | ICD-10-CM | POA: Diagnosis not present

## 2023-08-04 DIAGNOSIS — M79609 Pain in unspecified limb: Secondary | ICD-10-CM | POA: Diagnosis not present

## 2023-08-10 NOTE — Progress Notes (Signed)
ANNUAL DIABETIC FOOT EXAM  Subjective: Tammy [name] presents today for annual diabetic foot exam. Tammy [name] is a resident of [facility]. Chief Complaint  Patient presents with   Nail Problem    DFC, pt is a diabetic, Tammy [name] is unsure of her last A1C and last office visit, PCP is Dr Clint Guy   Patient confirms h/o diabetes.  Patient denies any h/o foot wounds.  Armando Gang, FNP is patient's PCP.  Past Medical History:  Diagnosis Date   Anxiety    Asthma    Bipolar 1 disorder (HCC)    CHF (congestive heart failure) (HCC)    COPD (chronic obstructive pulmonary disease) (HCC)    Dementia (HCC)    Epilepsy (HCC)    Seizures (HCC)    Patient Active Problem List   Diagnosis Date Noted   Seizure-like activity (HCC) 08/03/2019   B12 deficiency 06/23/2019   Iron deficiency 06/21/2019   History of seizure 04/07/2019   Seizure (HCC) 03/22/2019   Normocytic anemia 03/18/2019   Influenza A 11/10/2018   Hyponatremia 10/26/2018   Syncopal episodes 06/23/2017   Acute on chronic respiratory failure with hypoxia (HCC) 03/15/2017   COPD with acute exacerbation (HCC) 03/15/2017   Acute on chronic respiratory failure (HCC) 03/15/2017   Past Surgical History:  Procedure Laterality Date   APPENDECTOMY     hernia reapir     x 3     Current Outpatient Medications on File Prior to Visit  Medication Sig Dispense Refill   acetaminophen (TYLENOL) 500 MG tablet Take 500-1,000 mg by mouth daily as needed for mild pain, moderate pain or fever.      busPIRone (BUSPAR) 30 MG tablet Take 30 mg by mouth 2 (two) times daily.      carvedilol (COREG) 3.125 MG tablet Take 3.125 mg by mouth 2 (two) times daily with a meal.      cetirizine (ZYRTEC) 10 MG tablet Take 10 mg by mouth daily.     cyanocobalamin (,VITAMIN B-12,) 1000 MCG/ML injection 1000 mcg IM Once a month (Patient taking differently: Inject 1,000 mcg into the muscle every 30 (thirty) days.) 10 mL 1   Cyanocobalamin 1000 MCG  SUBL Place 1 tablet (1,000 mcg total) under the tongue daily. 90 tablet 0   empagliflozin (JARDIANCE) 25 MG TABS tablet Take 25 mg by mouth daily.      ferrous sulfate 325 (65 FE) MG EC tablet Take 1 tablet (325 mg total) by mouth daily with breakfast. 30 tablet 3   fluticasone (FLONASE) 50 MCG/ACT nasal spray Place 1 spray into both nostrils daily.      HYDROcodone-acetaminophen (NORCO/VICODIN) 5-325 MG tablet Take 1 tablet by mouth every 6 (six) hours as needed for moderate pain. 20 tablet 0   lamoTRIgine (LAMICTAL) 150 MG tablet Take 150 mg by mouth 2 (two) times daily.     levETIRAcetam (KEPPRA) 250 MG tablet Take 250 mg by mouth 2 (two) times daily.     levothyroxine (SYNTHROID, LEVOTHROID) 100 MCG tablet Take 100 mcg by mouth daily before breakfast.      lithium carbonate 150 MG capsule Take 150 mg by mouth daily.      metFORMIN (GLUCOPHAGE) 500 MG tablet Take 500 mg by mouth 2 (two) times daily with a meal.     methylPREDNISolone (MEDROL DOSEPAK) 4 MG TBPK tablet Take dose pack according to package directions 21 each 0   metroNIDAZOLE (FLAGYL) 500 MG tablet Take 500 mg by mouth 2 (two) times daily.  Omega-3 Fatty Acids (FISH OIL) 1000 MG CAPS Take 1,000 mg by mouth 2 (two) times daily.      pantoprazole (PROTONIX) 40 MG tablet Take 1 tablet by mouth daily.     pravastatin (PRAVACHOL) 40 MG tablet Take 40 mg by mouth daily.     risperiDONE (RISPERDAL) 3 MG tablet Take 3 mg by mouth at bedtime.      rOPINIRole (REQUIP) 1 MG tablet Take 1 mg by mouth at bedtime.     tamsulosin (FLOMAX) 0.4 MG CAPS capsule Take 1 capsule (0.4 mg total) by mouth daily. 90 capsule 3   TRELEGY ELLIPTA 100-62.5-25 MCG/INH AEPB Inhale 1 puff into the lungs daily.      Vitamin D, Ergocalciferol, (DRISDOL) 1.25 MG (50000 UT) CAPS capsule Take 50,000 Units by mouth every 7 (seven) days.     No current facility-administered medications on file prior to visit.    Allergies  Allergen Reactions   Penicillins Rash     Has patient had a PCN reaction causing immediate rash, facial/tongue/throat swelling, SOB or lightheadedness with hypotension: No Has patient had a PCN reaction causing severe rash involving mucus membranes or skin necrosis: No Has patient had a PCN reaction that required hospitalization: No Has patient had a PCN reaction occurring within the last 10 years: No If all of the above answers are "NO", then may proceed with Cephalosporin use.    Prednisone Rash   Social History   Occupational History   Not on file  Tobacco Use   Smoking status: Former    Current packs/day: 0.00    Types: Cigarettes    Quit date: 10/07/1998    Years since quitting: 24.8   Smokeless tobacco: Never  Vaping Use   Vaping status: Never Used  Substance and Sexual Activity   Alcohol use: No   Drug use: No   Sexual activity: Never   Family History  Problem Relation Age of Onset   Hypertension Mother    Kidney cancer Neg Hx    Bladder Cancer Neg Hx    Breast cancer Neg Hx    Immunization History  Administered Date(s) Administered   Influenza,inj,Quad PF,6+ Mos 06/24/2017   Pneumococcal Polysaccharide-23 03/16/2017, 11/11/2018     Review of Systems: Negative except as noted in the HPI.   Objective: There were no vitals filed for this visit.  Tammy [name] is a pleasant 62 y.o. female in NAD. AAO X 3.  Title   Diabetic Foot Exam - detailed Date & Time: 08/04/2023  2:30 PM Diabetic Foot exam was performed with the following findings: Yes  Visual Foot Exam completed.: Yes  Is there a history of foot ulcer?: No Is there a foot ulcer now?: No Is there swelling?: No Is there elevated skin temperature?: No Is there abnormal foot shape?: No Is there a claw toe deformity?: No Are the toenails long?: Yes Are the toenails thick?: Yes Are the toenails ingrown?: No Is the skin thin, fragile, shiny and hairless?": No Normal Range of Motion?: Yes Is there foot or ankle muscle weakness?: No Do  you have pain in calf while walking?: No Are the shoes appropriate in style and fit?: Yes Can the patient see the bottom of their feet?: No Pulse Foot Exam completed.: Yes   Right Posterior Tibialis: Present Left posterior Tibialis: Present   Right Dorsalis Pedis: Present Left Dorsalis Pedis: Present     Sensory Foot Exam Completed.: Yes Semmes-Weinstein Monofilament Test "+" means "has sensation" and "-" means "  no sensation"  R Foot Test Control: Pos L Foot Test Control: Pos   R Site 1-Great Toe: Pos L Site 1-Great Toe: Pos   R Site 4: Pos L Site 4: Pos   R site 5: Pos L Site 5: Pos  R Site 6: Pos L Site 6: Pos     Image components are not supported.   Image components are not supported. Image components are not supported.  Tuning [place] Right vibratory: present Left vibratory: present  Comments     Lab Results  Component Value Date   HGBA1C 6.7 (H) 03/22/2019   ADA Risk Categorization: Low Risk :  Patient has all of the following: Intact protective sensation No prior foot ulcer  No severe deformity Pedal pulses present  Assessment: 1. Pain due to onychomycosis of nail   2. Diabetes mellitus without complication (HCC)   3. Encounter for diabetic foot exam [facility])     Plan: -Patient was evaluated today. All questions/concerns addressed on today's visit. -Diabetic foot examination performed today. -Continue diabetic foot care principles: inspect feet daily, monitor glucose as recommended by PCP and/or Endocrinologist, and follow prescribed diet per PCP, Endocrinologist and/or dietician. -Patient to continue soft, supportive shoe gear daily. -Toenails 1-5 b/l were debrided in length and girth with sterile nail nippers and dremel without iatrogenic bleeding.  -Patient/POA to call should there be question/concern in the interim. Return in about 3 months (around 11/04/2023).  Freddie Breech, DPM

## 2023-08-14 ENCOUNTER — Emergency Department: Payer: Medicare Other

## 2023-08-14 ENCOUNTER — Inpatient Hospital Stay
Admission: EM | Admit: 2023-08-14 | Discharge: 2023-08-16 | DRG: 189 | Disposition: A | Discharge status: Home / Self Care | Payer: Medicare Other | Attending: Internal Medicine | Admitting: Internal Medicine

## 2023-08-14 ENCOUNTER — Other Ambulatory Visit: Payer: Self-pay

## 2023-08-14 DIAGNOSIS — Z1152 Encounter for screening for COVID-19: Secondary | ICD-10-CM | POA: Diagnosis not present

## 2023-08-14 DIAGNOSIS — J9621 Acute and chronic respiratory failure with hypoxia: Principal | ICD-10-CM | POA: Diagnosis present

## 2023-08-14 DIAGNOSIS — F17210 Nicotine dependence, cigarettes, uncomplicated: Secondary | ICD-10-CM | POA: Diagnosis present

## 2023-08-14 DIAGNOSIS — R0902 Hypoxemia: Secondary | ICD-10-CM

## 2023-08-14 DIAGNOSIS — Z79890 Hormone replacement therapy (postmenopausal): Secondary | ICD-10-CM | POA: Diagnosis not present

## 2023-08-14 DIAGNOSIS — E785 Hyperlipidemia, unspecified: Secondary | ICD-10-CM | POA: Diagnosis present

## 2023-08-14 DIAGNOSIS — G40909 Epilepsy, unspecified, not intractable, without status epilepticus: Secondary | ICD-10-CM | POA: Diagnosis present

## 2023-08-14 DIAGNOSIS — Z79899 Other long term (current) drug therapy: Secondary | ICD-10-CM | POA: Diagnosis not present

## 2023-08-14 DIAGNOSIS — R0603 Acute respiratory distress: Secondary | ICD-10-CM | POA: Diagnosis present

## 2023-08-14 DIAGNOSIS — R1310 Dysphagia, unspecified: Secondary | ICD-10-CM | POA: Diagnosis present

## 2023-08-14 DIAGNOSIS — I2721 Secondary pulmonary arterial hypertension: Secondary | ICD-10-CM | POA: Diagnosis present

## 2023-08-14 DIAGNOSIS — J441 Chronic obstructive pulmonary disease with (acute) exacerbation: Secondary | ICD-10-CM | POA: Diagnosis present

## 2023-08-14 DIAGNOSIS — Z7984 Long term (current) use of oral hypoglycemic drugs: Secondary | ICD-10-CM

## 2023-08-14 DIAGNOSIS — Z9981 Dependence on supplemental oxygen: Secondary | ICD-10-CM | POA: Diagnosis not present

## 2023-08-14 DIAGNOSIS — E039 Hypothyroidism, unspecified: Secondary | ICD-10-CM | POA: Diagnosis present

## 2023-08-14 DIAGNOSIS — I5032 Chronic diastolic (congestive) heart failure: Secondary | ICD-10-CM | POA: Diagnosis present

## 2023-08-14 DIAGNOSIS — Z7951 Long term (current) use of inhaled steroids: Secondary | ICD-10-CM | POA: Diagnosis not present

## 2023-08-14 DIAGNOSIS — Z8249 Family history of ischemic heart disease and other diseases of the circulatory system: Secondary | ICD-10-CM | POA: Diagnosis not present

## 2023-08-14 DIAGNOSIS — F319 Bipolar disorder, unspecified: Secondary | ICD-10-CM | POA: Diagnosis present

## 2023-08-14 DIAGNOSIS — E119 Type 2 diabetes mellitus without complications: Secondary | ICD-10-CM

## 2023-08-14 DIAGNOSIS — E1169 Type 2 diabetes mellitus with other specified complication: Secondary | ICD-10-CM

## 2023-08-14 DIAGNOSIS — F0393 Unspecified dementia, unspecified severity, with mood disturbance: Secondary | ICD-10-CM | POA: Diagnosis present

## 2023-08-14 DIAGNOSIS — I10 Essential (primary) hypertension: Secondary | ICD-10-CM | POA: Diagnosis present

## 2023-08-14 DIAGNOSIS — I110 Hypertensive heart disease with heart failure: Secondary | ICD-10-CM | POA: Diagnosis present

## 2023-08-14 DIAGNOSIS — Z880 Allergy status to penicillin: Secondary | ICD-10-CM

## 2023-08-14 LAB — RESP PANEL BY RT-PCR (RSV, FLU A&B, COVID)  RVPGX2
Influenza A by PCR: NEGATIVE
Influenza B by PCR: NEGATIVE
Resp Syncytial Virus by PCR: NEGATIVE
SARS Coronavirus 2 by RT PCR: NEGATIVE

## 2023-08-14 LAB — CBC
HCT: 39.2 % (ref 36.0–46.0)
Hemoglobin: 12.5 g/dL (ref 12.0–15.0)
MCH: 30.8 pg (ref 26.0–34.0)
MCHC: 31.9 g/dL (ref 30.0–36.0)
MCV: 96.6 fL (ref 80.0–100.0)
Platelets: 280 10*3/uL (ref 150–400)
RBC: 4.06 MIL/uL (ref 3.87–5.11)
RDW: 12.5 % (ref 11.5–15.5)
WBC: 8.3 10*3/uL (ref 4.0–10.5)
nRBC: 0 % (ref 0.0–0.2)

## 2023-08-14 LAB — COMPREHENSIVE METABOLIC PANEL
ALT: 15 U/L (ref 0–44)
AST: 18 U/L (ref 15–41)
Albumin: 3.1 g/dL — ABNORMAL LOW (ref 3.5–5.0)
Alkaline Phosphatase: 94 U/L (ref 38–126)
Anion gap: 10 (ref 5–15)
BUN: 22 mg/dL (ref 8–23)
CO2: 22 mmol/L (ref 22–32)
Calcium: 9.4 mg/dL (ref 8.9–10.3)
Chloride: 103 mmol/L (ref 98–111)
Creatinine, Ser: 1.15 mg/dL — ABNORMAL HIGH (ref 0.44–1.00)
GFR, Estimated: 54 mL/min — ABNORMAL LOW (ref >60)
Glucose, Bld: 217 mg/dL — ABNORMAL HIGH (ref 70–99)
Potassium: 3.9 mmol/L (ref 3.5–5.1)
Sodium: 135 mmol/L (ref 135–145)
Total Bilirubin: 0.7 mg/dL (ref <1.2)
Total Protein: 6.3 g/dL — ABNORMAL LOW (ref 6.5–8.1)

## 2023-08-14 LAB — LACTIC ACID, PLASMA
Lactic Acid, Venous: 1 mmol/L (ref 0.5–1.9)
Lactic Acid, Venous: 1.1 mmol/L (ref 0.5–1.9)

## 2023-08-14 LAB — BRAIN NATRIURETIC PEPTIDE: B Natriuretic Peptide: 37.8 pg/mL (ref 0.0–100.0)

## 2023-08-14 LAB — TROPONIN I (HIGH SENSITIVITY)
Troponin I (High Sensitivity): 20 ng/L — ABNORMAL HIGH (ref <18)
Troponin I (High Sensitivity): 22 ng/L — ABNORMAL HIGH (ref <18)

## 2023-08-14 LAB — LIPASE, BLOOD: Lipase: 20 U/L (ref 11–51)

## 2023-08-14 MED ORDER — LAMOTRIGINE 100 MG PO TABS
150.0000 mg | ORAL_TABLET | Freq: Two times a day (BID) | ORAL | Status: DC
  Administered 2023-08-15 – 2023-08-16 (×3): 150 mg via ORAL
  Filled 2023-08-14 (×3): qty 2

## 2023-08-14 MED ORDER — PREDNISONE 20 MG PO TABS
40.0000 mg | ORAL_TABLET | Freq: Every day | ORAL | Status: DC
  Administered 2023-08-16: 40 mg via ORAL
  Filled 2023-08-14 (×2): qty 2

## 2023-08-14 MED ORDER — ACETAMINOPHEN 325 MG PO TABS: 650.0000 mg | ORAL_TABLET | Freq: Four times a day (QID) | ORAL | Status: DC | PRN

## 2023-08-14 MED ORDER — PRAVASTATIN SODIUM 20 MG PO TABS
40.0000 mg | ORAL_TABLET | Freq: Every day | ORAL | Status: DC
  Administered 2023-08-15 – 2023-08-16 (×2): 40 mg via ORAL
  Filled 2023-08-14 (×2): qty 2

## 2023-08-14 MED ORDER — SODIUM CHLORIDE 0.9 % IV BOLUS
500.0000 mL | Freq: Once | INTRAVENOUS | Status: AC
Start: 2023-08-14 — End: 2023-08-14
  Administered 2023-08-14: 500 mL via INTRAVENOUS

## 2023-08-14 MED ORDER — ENOXAPARIN SODIUM 40 MG/0.4ML IJ SOSY
40.0000 mg | PREFILLED_SYRINGE | INTRAMUSCULAR | Status: DC
  Administered 2023-08-15 – 2023-08-16 (×2): 40 mg via SUBCUTANEOUS
  Filled 2023-08-14 (×2): qty 0.4

## 2023-08-14 MED ORDER — LEVETIRACETAM 250 MG PO TABS
250.0000 mg | ORAL_TABLET | Freq: Two times a day (BID) | ORAL | Status: DC
  Administered 2023-08-15 – 2023-08-16 (×3): 250 mg via ORAL
  Filled 2023-08-14 (×3): qty 1

## 2023-08-14 MED ORDER — ONDANSETRON HCL 4 MG PO TABS: 4.0000 mg | ORAL_TABLET | Freq: Four times a day (QID) | ORAL | Status: DC | PRN

## 2023-08-14 MED ORDER — ONDANSETRON HCL 4 MG/2ML IJ SOLN: 4.0000 mg | Freq: Four times a day (QID) | INTRAMUSCULAR | Status: DC | PRN

## 2023-08-14 MED ORDER — ROPINIROLE HCL 1 MG PO TABS
1.0000 mg | ORAL_TABLET | Freq: Every day | ORAL | Status: DC
  Administered 2023-08-15 (×2): 1 mg via ORAL
  Filled 2023-08-14 (×2): qty 1

## 2023-08-14 MED ORDER — ACETAMINOPHEN 650 MG RE SUPP
650.0000 mg | Freq: Four times a day (QID) | RECTAL | Status: DC | PRN
Start: 2023-08-14 — End: 2023-08-16

## 2023-08-14 MED ORDER — ALBUTEROL SULFATE (2.5 MG/3ML) 0.083% IN NEBU: 2.5000 mg | INHALATION_SOLUTION | RESPIRATORY_TRACT | Status: DC | PRN

## 2023-08-14 MED ORDER — IPRATROPIUM-ALBUTEROL 0.5-2.5 (3) MG/3ML IN SOLN
3.0000 mL | Freq: Four times a day (QID) | RESPIRATORY_TRACT | Status: DC
Start: 2023-08-15 — End: 2023-08-15
  Administered 2023-08-15 (×4): 3 mL via RESPIRATORY_TRACT
  Filled 2023-08-14 (×4): qty 3

## 2023-08-14 MED ORDER — METHYLPREDNISOLONE SODIUM SUCC 40 MG IJ SOLR
40.0000 mg | Freq: Two times a day (BID) | INTRAMUSCULAR | Status: AC
  Administered 2023-08-15 (×2): 40 mg via INTRAVENOUS
  Filled 2023-08-14 (×2): qty 1

## 2023-08-14 MED ORDER — DOXYCYCLINE HYCLATE 100 MG PO TABS
100.0000 mg | ORAL_TABLET | Freq: Once | ORAL | Status: AC
  Administered 2023-08-14: 100 mg via ORAL
  Filled 2023-08-14: qty 1

## 2023-08-14 MED ORDER — SODIUM CHLORIDE 0.9 % IV SOLN
1.0000 g | INTRAVENOUS | Status: DC
  Administered 2023-08-15 – 2023-08-16 (×2): 1 g via INTRAVENOUS
  Filled 2023-08-14 (×2): qty 10

## 2023-08-14 MED ORDER — CARVEDILOL 3.125 MG PO TABS
3.1250 mg | ORAL_TABLET | Freq: Two times a day (BID) | ORAL | Status: DC
  Administered 2023-08-15 – 2023-08-16 (×3): 3.125 mg via ORAL
  Filled 2023-08-14 (×3): qty 1

## 2023-08-14 NOTE — ED Provider Notes (Signed)
[facility] Provider Note    Event Date/Time   First MD Initiated Contact with Patient 08/14/23 2033     (approximate)   History   Respiratory Distress   HPI  Tammy [name] is a 62 y.o. female past medical history significant for COPD on baseline 2 L of home oxygen, CHF, dementia, asthma, who presents to the emergency department for a cough and shortness of breath.  When EMS arrived patient was unable to speak and they stated that she had extremely diminished lung sounds and was not having any air movement.  Patient was given 2 rounds of DuoNebs, given IV Solu-Medrol 125 mg and 2 g of IV magnesium prior to arrival.  Patient was placed on BiPAP on arrival in the emergency department.  On my evaluation patient states that she is feeling much better after receiving nebulizer treatments.  Does endorse a 2-day history of cough that has been nonproductive.  Denies any chest pain.  Denies any history of DVT or PE.     Physical Exam   Triage Vital Signs: ED Triage Vitals  Encounter Vitals Group     BP 08/14/23 2030 (!) 94/46     Systolic BP Percentile --      Diastolic BP Percentile --      Pulse Rate 08/14/23 2030 79     Resp 08/14/23 2030 20     Temp 08/14/23 2030 97.9 F (36.6 C)     Temp Source 08/14/23 2030 Oral     SpO2 08/14/23 2030 99 %     Weight 08/14/23 2036 157 lb 13.6 oz (71.6 kg)     Height --      Head Circumference --      Peak Flow --      Pain Score 08/14/23 2036 6     Pain Loc --      Pain Education --      Exclude from Growth Chart --     Most recent vital signs: Vitals:   08/14/23 2100 08/14/23 2215  BP: (!) 89/56 99/60  Pulse: 75 73  Resp: 20 20  Temp:    SpO2: 96% 97%    Physical Exam Constitutional:      Appearance: She is well-developed.  HENT:     Head: Atraumatic.  Eyes:     Extraocular Movements: Extraocular movements intact.     Conjunctiva/sclera: Conjunctivae normal.     Pupils: Pupils are equal, round,  and reactive to light.  Cardiovascular:     Rate and Rhythm: Regular rhythm.  Pulmonary:     Effort: No respiratory distress.     Breath sounds: No wheezing.     Comments: Effort normal on BiPAP Abdominal:     General: There is no distension.  Musculoskeletal:        General: Normal range of motion.     Cervical back: Normal range of motion.     Right lower leg: No edema.     Left lower leg: No edema.  Skin:    General: Skin is warm.     Capillary Refill: Capillary refill takes less than 2 seconds.  Neurological:     Mental Status: She is alert. Mental status is at baseline.     IMPRESSION / MDM / ASSESSMENT AND PLAN / ED COURSE  I reviewed the triage vital signs and the nursing notes.  Differential diagnosis including pneumonia, COVID/influenza, COPD exacerbation, CHF exacerbation, anemia, ACS, pulmonary embolism  EKG  I, Carollee Herter  Tryton Bodi, the attending physician, personally viewed and interpreted this ECG.   Rate: Normal  Rhythm: Normal sinus  Axis: Normal  Intervals: Normal  ST&T Change: None  No tachycardic or bradycardic dysrhythmias while on cardiac telemetry.  RADIOLOGY I independently reviewed imaging, my interpretation of imaging: Chest x-ray with no acute findings  CTA ordered -discussed with Dr. Para March who will follow-up on the results  LABS (all labs ordered are listed, but only abnormal results are displayed) Labs interpreted as -    Labs Reviewed  COMPREHENSIVE METABOLIC PANEL - Abnormal; Notable for the following components:      Result Value   Glucose, Bld 217 (*)    Creatinine, Ser 1.15 (*)    Total Protein 6.3 (*)    Albumin 3.1 (*)    GFR, Estimated 54 (*)    All other components within normal limits  BLOOD GAS, VENOUS - Abnormal; Notable for the following components:   pO2, Ven 69 (*)    Acid-base deficit 2.6 (*)    All other components within normal limits  TROPONIN I (HIGH SENSITIVITY) - Abnormal; Notable for the following components:    Troponin I (High Sensitivity) 20 (*)    All other components within normal limits  TROPONIN I (HIGH SENSITIVITY) - Abnormal; Notable for the following components:   Troponin I (High Sensitivity) 22 (*)    All other components within normal limits  RESP PANEL BY RT-PCR (RSV, FLU A&B, COVID)  RVPGX2  CBC  LIPASE, BLOOD  LACTIC ACID, PLASMA  LACTIC ACID, PLASMA  BRAIN NATRIURETIC PEPTIDE  URINALYSIS, ROUTINE W REFLEX MICROSCOPIC  HIV ANTIBODY (ROUTINE TESTING W REFLEX)     MDM    On arrival clinical picture concerning for possible severe COPD exacerbation.  On my evaluation patient without any wheezing and with good air movement but did receive multiple treatments prior to arrival.  Able to transition off of BiPAP to 2 L nasal cannula.  Chest x-ray with no signs of pneumonia.  Troponin mildly elevated but denies any chest pain at this time.  No signs of hypercarbia.  Initial lactic acid within normal limits.  Normal BNP.  Clinical picture concerning for COPD exacerbation.  Given p.o. doxycycline.  Consulted hospitalist for admission  Notified by nursing staff that the patient was doing okay and then all of a sudden had severe hypoxia on her 2 L nasal cannula to the 70s.  Had to place back on 6 L nasal cannula.  On my reevaluation patient without significant wheezing.  Will obtain CTA to evaluate for possible pulmonary embolism.  Discussed with hospitalist Dr. Para March who will follow-up on the result.   PROCEDURES:  Critical Care performed: yes  .Critical Care  Performed by: Corena Herter, MD Authorized by: Corena Herter, MD   Critical care provider statement:    Critical care time (minutes):  45   Critical care time was exclusive of:  Separately billable procedures and treating other patients   Critical care was necessary to treat or prevent imminent or life-threatening deterioration of the following conditions:  Respiratory failure   Critical care was time spent personally by me  on the following activities:  Development of treatment plan with patient or surrogate, discussions with consultants, evaluation of patient's response to treatment, examination of patient, ordering and review of laboratory studies, ordering and review of radiographic studies, ordering and performing treatments and interventions, pulse oximetry, re-evaluation of patient's condition and review of old charts   Patient's presentation is most consistent with  acute presentation with potential threat to life or bodily function.   MEDICATIONS ORDERED IN ED: Medications  carvedilol (COREG) tablet 3.125 mg (has no administration in time range)  pravastatin (PRAVACHOL) tablet 40 mg (has no administration in time range)  levETIRAcetam (KEPPRA) tablet 250 mg (has no administration in time range)  lamoTRIgine (LAMICTAL) tablet 150 mg (has no administration in time range)  rOPINIRole (REQUIP) tablet 1 mg (has no administration in time range)  enoxaparin (LOVENOX) injection 40 mg (has no administration in time range)  acetaminophen (TYLENOL) tablet 650 mg (has no administration in time range)    Or  acetaminophen (TYLENOL) suppository 650 mg (has no administration in time range)  ondansetron (ZOFRAN) tablet 4 mg (has no administration in time range)    Or  ondansetron (ZOFRAN) injection 4 mg (has no administration in time range)  cefTRIAXone (ROCEPHIN) 1 g in sodium chloride 0.9 % 100 mL IVPB (has no administration in time range)  methylPREDNISolone sodium succinate (SOLU-MEDROL) 40 mg/mL injection 40 mg (has no administration in time range)    Followed by  predniSONE (DELTASONE) tablet 40 mg (has no administration in time range)  ipratropium-albuterol (DUONEB) 0.5-2.5 (3) MG/3ML nebulizer solution 3 mL (has no administration in time range)  albuterol (PROVENTIL) (2.5 MG/3ML) 0.083% nebulizer solution 2.5 mg (has no administration in time range)  sodium chloride 0.9 % bolus 500 mL (0 mLs Intravenous Stopped  08/14/23 2231)  doxycycline (VIBRA-TABS) tablet 100 mg (100 mg Oral Given 08/14/23 2346)  iohexol (OMNIPAQUE) 350 MG/ML injection 75 mL (75 mLs Intravenous Contrast Given 08/15/23 0048)    FINAL CLINICAL IMPRESSION(S) / ED DIAGNOSES   Final diagnoses:  COPD exacerbation (HCC)  Respiratory distress  Hypoxia     Rx / DC Orders   ED Discharge Orders     None        Note:  This document was prepared using Dragon voice recognition software and may include unintentional dictation errors.   Corena Herter, MD 08/15/23 [phone]

## 2023-08-14 NOTE — Assessment & Plan Note (Addendum)
Acute on chronic respiratory failure with hypoxia Pulmonary arterial hypertension on CT CTA chest negative for PE, showing changes of PAH Continue BiPAP and wean as tolerated to O2 via nasal cannula.  Patient wears O2 at 2 L at baseline Scheduled and as needed nebulized bronchodilators IV steroids Rocephin given severity of episode Flutter valve, mucolytic's

## 2023-08-14 NOTE — ED Notes (Signed)
EDT John alerted this RN that patient's O2 monitor was reading in the 70s. This RN immediately entered patient's room and found patient to be moving around in bed. This RN reassessed patient's O2 sat once she got comfortable. O2 sats remained at 78%. This RN moved patient's oxygen from 2L to 3L. Patient still remained in the 70s. This RN moved the oxygen from 3L to 4L when patient then had O2 sats sitting at 85%. This RN lastly moved the patient to 6L where she remained in the mid to low 90s. Patient sats at 93% at this time. Patient states she has no other needs at this time and denies shortness of breath. MD Mumma notified.

## 2023-08-14 NOTE — Assessment & Plan Note (Addendum)
Clinically euvolemic with BNP 37, however CTA chest showing mild superimposed interstitial and alveolar pulmonary edema Continue carvedilol Can consider Lasix Last EF over 65% 2018 Daily weights with intake and output monitoring Will get an updated echocardiogram

## 2023-08-14 NOTE — ED Triage Notes (Signed)
Pt presents to ER via ems with c/o resp distress.  Ems reports on arrival, pt was unable to speak very much at all, and had extremely diminished lung sounds.  Pt has hx of athma, COPD and CHF.  Pt is c/o some abd pain that started a few hours ago as well.  Pt is otherwise A&O x4 at this time.    RT at bedside on arrival.  Pt placed on Bi_PAP with significant improvement of symptoms.    EMS meds:  125mg  solu-medrol  2g mag  1 albuterol  2 duonebs

## 2023-08-14 NOTE — Assessment & Plan Note (Signed)
Continue buspirone, lamotrigine, lithium pending med rec

## 2023-08-14 NOTE — H&P (Signed)
History and Physical    Patient: Tammy [name] NWG:[id number] DOB: 11-18-1960 DOA: 08/14/2023 DOS: the patient was seen and examined on 08/14/2023 PCP: Armando Gang, FNP  Patient coming from: Home  Chief Complaint:  Chief Complaint  Patient presents with   Respiratory Distress    HPI: Tammy [name] is a 62 y.o. female with medical history significant for Seizure disorder, bipolar mood disorder, hypothyroidism, diastolic CHF (EF> 65% 06/2017), COPD on home O2 at 2 L, being admitted for respiratory failure secondary to COPD exacerbation.  Patient presented with a 2-day history of cough and congestion that started a few hours prior to arrival.  EMS reports the patient was in distress with increased work of breathing and was placed on CPAP for transport, transitioning to BiPAP on arrival.  She received Solu-Medrol, magnesium and DuoNebs en route.  She denies chest pain, fever or chills or leg pain or swelling. ED Course and data review: Soft blood pressure with systolic in the 90s with otherwise normal vitals. Labs: VBG on BiPAP at FiO2 40% with pH 7.32 and pCO2 46. CBC within normal limits COVID flu and RSV negative Troponin 20 and BNP 37.8 Blood glucose 217 and creatinine 1.15 with the latter being her baseline EKG, personally viewed and interpreted showing sinus rhythm at 80 with no concerning ST-T wave changes Chest x-ray shows emphysema and no acute airspace disease Patient was given a fluid bolus along with additional DuoNebs and started on doxycycline tablets Hospitalist consulted for admission.   Review of Systems: As mentioned in the history of present illness. All other systems reviewed and are negative.  Past Medical History:  Diagnosis Date   Anxiety    Asthma    Bipolar 1 disorder (HCC)    CHF (congestive heart failure) (HCC)    COPD (chronic obstructive pulmonary disease) (HCC)    Dementia (HCC)    Epilepsy (HCC)    Seizures (HCC)    Past Surgical  History:  Procedure Laterality Date   APPENDECTOMY     hernia reapir     x 3     Social History:  reports that she quit smoking about 24 years ago. Her smoking use included cigarettes. She has never used smokeless tobacco. She reports that she does not drink alcohol and does not use drugs.  Allergies  Allergen Reactions   Penicillins Rash    Has patient had a PCN reaction causing immediate rash, facial/tongue/throat swelling, SOB or lightheadedness with hypotension: No Has patient had a PCN reaction causing severe rash involving mucus membranes or skin necrosis: No Has patient had a PCN reaction that required hospitalization: No Has patient had a PCN reaction occurring within the last 10 years: No If all of the above answers are "NO", then may proceed with Cephalosporin use.    Prednisone Rash    Family History  Problem Relation Age of Onset   Hypertension Mother    Kidney cancer Neg Hx    Bladder Cancer Neg Hx    Breast cancer Neg Hx     Prior to Admission medications   Medication Sig Start Date End Date Taking? Authorizing Provider  acetaminophen (TYLENOL) 500 MG tablet Take 500-1,000 mg by mouth daily as needed for mild pain, moderate pain or fever.     [provider]  busPIRone (BUSPAR) 30 MG tablet Take 30 mg by mouth 2 (two) times daily.     [provider]  carvedilol (COREG) 3.125 MG tablet Take 3.125 mg by mouth  2 (two) times daily with a meal.     [provider]  cetirizine (ZYRTEC) 10 MG tablet Take 10 mg by mouth daily.    [provider]  cyanocobalamin (,VITAMIN B-12,) 1000 MCG/ML injection 1000 mcg IM Once a month Patient taking differently: Inject 1,000 mcg into the muscle every 30 (thirty) days. 03/18/19   Borders, Daryl Eastern, NP  Cyanocobalamin 1000 MCG SUBL Place 1 tablet (1,000 mcg total) under the tongue daily. 07/01/19   Earna Coder, MD  empagliflozin (JARDIANCE) 25 MG TABS tablet Take 25 mg by mouth daily.      [provider]  ferrous sulfate 325 (65 FE) MG EC tablet Take 1 tablet (325 mg total) by mouth daily with breakfast. 08/20/19   Earna Coder, MD  fluticasone (FLONASE) 50 MCG/ACT nasal spray Place 1 spray into both nostrils daily.     [provider]  HYDROcodone-acetaminophen (NORCO/VICODIN) 5-325 MG tablet Take 1 tablet by mouth every 6 (six) hours as needed for moderate pain. 12/27/19   Jene Every, MD  lamoTRIgine (LAMICTAL) 150 MG tablet Take 150 mg by mouth 2 (two) times daily.    [provider]  levETIRAcetam (KEPPRA) 250 MG tablet Take 250 mg by mouth 2 (two) times daily.    [provider]  levothyroxine (SYNTHROID, LEVOTHROID) 100 MCG tablet Take 100 mcg by mouth daily before breakfast.     [provider]  lithium carbonate 150 MG capsule Take 150 mg by mouth daily.     [provider]  metFORMIN (GLUCOPHAGE) 500 MG tablet Take 500 mg by mouth 2 (two) times daily with a meal.    [provider]  methylPREDNISolone (MEDROL DOSEPAK) 4 MG TBPK tablet Take dose pack according to package directions 07/07/22   Sharman Cheek, MD  metroNIDAZOLE (FLAGYL) 500 MG tablet Take 500 mg by mouth 2 (two) times daily. 10/11/20   [provider]  Omega-3 Fatty Acids (FISH OIL) 1000 MG CAPS Take 1,000 mg by mouth 2 (two) times daily.     [provider]  pantoprazole (PROTONIX) 40 MG tablet Take 1 tablet by mouth daily. 10/30/20   [provider]  pravastatin (PRAVACHOL) 40 MG tablet Take 40 mg by mouth daily.    [provider]  risperiDONE (RISPERDAL) 3 MG tablet Take 3 mg by mouth at bedtime.     [provider]  rOPINIRole (REQUIP) 1 MG tablet Take 1 mg by mouth at bedtime.    [provider]  tamsulosin (FLOMAX) 0.4 MG CAPS capsule Take 1 capsule (0.4 mg total) by mouth daily. 03/25/17   McGowan, Shannon A, PA-C  TRELEGY ELLIPTA 100-62.5-25 MCG/INH AEPB Inhale 1 puff into the  lungs daily.  11/17/18   [provider]  Vitamin D, Ergocalciferol, (DRISDOL) 1.25 MG (50000 UT) CAPS capsule Take 50,000 Units by mouth every 7 (seven) days.    [provider]    Physical Exam: Vitals:   08/14/23 2036 08/14/23 2045 08/14/23 2100 08/14/23 2215  BP:  (!) 91/56 (!) 89/56 99/60  Pulse:  80 75 73  Resp:  (!) 21 20 20   Temp:      TempSrc:      SpO2:  97% 96% 97%  Weight: 71.6 kg      Physical Exam Vitals and nursing note reviewed.  Constitutional:      General: She is not in acute distress.    Comments: Conversational dyspnea with increased work of breathing  HENT:  Head: Normocephalic and atraumatic.  Cardiovascular:     Rate and Rhythm: Normal rate and regular rhythm.     Heart sounds: Normal heart sounds.  Pulmonary:     Effort: Tachypnea present.     Breath sounds: Normal breath sounds.  Abdominal:     Palpations: Abdomen is soft.     Tenderness: There is no abdominal tenderness.  Neurological:     Mental Status: Mental status is at baseline.     Labs on Admission: I have personally reviewed following labs and imaging studies  CBC: Recent Labs  Lab 08/14/23 2041  WBC 8.3  HGB 12.5  HCT 39.2  MCV 96.6  PLT 280   Basic Metabolic Panel: Recent Labs  Lab 08/14/23 2041  NA 135  K 3.9  CL 103  CO2 22  GLUCOSE 217*  BUN 22  CREATININE 1.15*  CALCIUM 9.4   GFR: Estimated Creatinine Clearance: 47 mL/min (A) (by C-G formula based on SCr of 1.15 mg/dL (H)). Liver Function Tests: Recent Labs  Lab 08/14/23 2041  AST 18  ALT 15  ALKPHOS 94  BILITOT 0.7  PROT 6.3*  ALBUMIN 3.1*   Recent Labs  Lab 08/14/23 2041  LIPASE 20   No results for input(s): "AMMONIA" in the last 168 hours. Coagulation Profile: No results for input(s): "INR", "PROTIME" in the last 168 hours. Cardiac Enzymes: No results for input(s): "CKTOTAL", "CKMB", "CKMBINDEX", "TROPONINI" in the last 168 hours. BNP (last 3 results) No results for  input(s): "PROBNP" in the last 8760 hours. HbA1C: No results for input(s): "HGBA1C" in the last 72 hours. CBG: No results for input(s): "GLUCAP" in the last 168 hours. Lipid Profile: No results for input(s): "CHOL", "HDL", "LDLCALC", "TRIG", "CHOLHDL", "LDLDIRECT" in the last 72 hours. Thyroid Function Tests: No results for input(s): "TSH", "T4TOTAL", "FREET4", "T3FREE", "THYROIDAB" in the last 72 hours. Anemia Panel: No results for input(s): "VITAMINB12", "FOLATE", "FERRITIN", "TIBC", "IRON", "RETICCTPCT" in the last 72 hours. Urine analysis:    Component Value Date/Time   COLORURINE YELLOW (A) 12/26/2019 1748   APPEARANCEUR CLOUDY (A) 12/26/2019 1748   LABSPEC 1.027 12/26/2019 1748   PHURINE 5.0 12/26/2019 1748   GLUCOSEU >=500 (A) 12/26/2019 1748   HGBUR NEGATIVE 12/26/2019 1748   BILIRUBINUR negative 01/03/2023 1915   KETONESUR negative 01/03/2023 1915   KETONESUR NEGATIVE 12/26/2019 1748   PROTEINUR negative 01/03/2023 1915   PROTEINUR NEGATIVE 12/26/2019 1748   UROBILINOGEN 1.0 01/03/2023 1915   NITRITE Negative 01/03/2023 1915   NITRITE NEGATIVE 12/26/2019 1748   LEUKOCYTESUR Small (1+) (A) 01/03/2023 1915   LEUKOCYTESUR SMALL (A) 12/26/2019 1748    Radiological Exams on Admission: CT Angio Chest Pulmonary Embolism (PE) W or WO Contrast  Result Date: 08/15/2023 CLINICAL DATA:  Pulmonary embolism, asthma, COPD, respiratory distress EXAM: CT ANGIOGRAPHY CHEST WITH CONTRAST TECHNIQUE: Multidetector CT imaging of the chest was performed using the standard protocol during bolus administration of intravenous contrast. Multiplanar CT image reconstructions and MIPs were obtained to evaluate the vascular anatomy. RADIATION DOSE REDUCTION: This exam was performed according to the departmental dose-optimization program which includes automated exposure control, adjustment of the mA and/or kV according to patient size and/or use of iterative reconstruction technique. CONTRAST:  75mL  OMNIPAQUE IOHEXOL 350 MG/ML SOLN COMPARISON:  11/10/2018 FINDINGS: Cardiovascular: There is adequate opacification of the pulmonary arterial tree. There is no intraluminal filling defect identified to suggest acute pulmonary embolism. The central pulmonary arteries are markedly enlarged in keeping with changes of pulmonary arterial hypertension. There  is extensive multi-vessel coronary artery calcification. Global cardiac size is within normal limits. No pericardial effusion. Extensive atherosclerotic calcification within the thoracic aorta. No aortic aneurysm. 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 Serial 50% stenosis is noted of the vessel just proximal to the takeoff of the left vertebral artery. There is a suspected hemodynamically significant stenosis left common carotid artery at its origin though this is not well characterized on this examination. Mediastinum/Nodes: There is borderline pathologic mediastinal adenopathy with the index lymph node within the right paratracheal lymph node group measuring up to 17 mm in short axis diameter. This is nonspecific and may be reactive in nature though a low-grade lymphoproliferative process could appear similarly. Visualized thyroid is unremarkable. Esophagus is unremarkable. Lungs/Pleura: Moderate emphysema. Superimposed asymmetric ground-glass pulmonary infiltrate and smooth interlobular septal thickening is present in keeping with mild interstitial and alveolar pulmonary edema. 4 mm subpleural pulmonary nodule within the right upper lobe, axial image # 51/5, indeterminate, minimally enlarged since prior examination. No pneumothorax or pleural effusion. No central obstructing lesion. 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 Cholecystectomy has been performed. No acute abnormality. Musculoskeletal: No acute bone  abnormality. No lytic or blastic bone lesion. Review of the MIP images confirms the above findings. IMPRESSION: 1. No pulmonary embolism. 2. Extensive multi-vessel coronary artery calcification. 3. Marked enlargement of the central pulmonary arteries in keeping with changes of pulmonary arterial hypertension. 4. Moderate emphysema. 5. Mild superimposed interstitial and alveolar pulmonary edema. 6. 4 mm subpleural pulmonary nodule within the right upper lobe, indeterminate, minimally enlarged since prior examination. This is likely benign given its minimal change since prior examination further follow-up is not required. 7. Borderline pathologic mediastinal adenopathy, nonspecific and may be reactive in nature though a low-grade lymphoproliferative process could appear similarly. Follow-up evaluation in 3-6 months following resolution of patient's acute issues be helpful in documenting resolution. Aortic Atherosclerosis (ICD10-I70.0) and Emphysema (ICD10-J43.9). Electronically Signed   By: Helyn Numbers M.D.   On: 08/15/2023 01:41   DG Chest Portable 1 View  Result Date: 08/14/2023 CLINICAL DATA:  Respiratory distress EXAM: PORTABLE CHEST 1 VIEW COMPARISON:  01/23/2023 FINDINGS: Single frontal view of the chest demonstrates an unremarkable cardiac silhouette. No acute airspace disease, effusion, or pneumothorax. Chronic background scarring consistent with emphysema. No acute bony abnormalities. IMPRESSION: 1. Emphysema.  No acute airspace disease. Electronically Signed   By: Sharlet Salina M.D.   On: 08/14/2023 21:47     Data Reviewed: Relevant notes from primary care and specialist visits, past discharge summaries as available in EHR, including Care Everywhere. Prior diagnostic testing as pertinent to current admission diagnoses Updated medications and problem lists for reconciliation ED course, including vitals, labs, imaging, treatment and response to treatment Triage notes, nursing and pharmacy notes  and ED provider's notes Notable results as noted in HPI   Assessment and Plan: * COPD with acute exacerbation (HCC) Acute on chronic respiratory failure with hypoxia Pulmonary arterial hypertension on CT CTA chest negative for PE, showing changes of PAH Continue BiPAP and wean as tolerated to O2 via nasal cannula.  Patient wears O2 at 2 L at baseline Scheduled and as needed nebulized bronchodilators IV steroids Rocephin given severity of episode Flutter valve, mucolytic's  Chronic diastolic CHF (congestive heart failure) (HCC) Clinically euvolemic with BNP 37, however CTA chest showing mild superimposed interstitial and alveolar pulmonary edema Continue carvedilol Can  consider Lasix Last EF over 65% 2018 Daily weights with intake and output monitoring Will get an updated echocardiogram  Bipolar mood disorder (HCC) Continue buspirone, lamotrigine, lithium pending med rec  Hypothyroidism Continue levothyroxine  Seizure disorder (HCC) Continue Lamictal and Keppra    DVT prophylaxis: Lovenox  Consults: none  Advance Care Planning:   Code Status: Prior   Family Communication: none  Disposition Plan: Back to previous home environment  Severity of Illness: The appropriate patient status for this patient is INPATIENT. Inpatient status is judged to be reasonable and necessary in order to provide the required intensity of service to ensure the patient's safety. The patient's presenting symptoms, physical exam findings, and initial radiographic and laboratory data in the context of their chronic comorbidities is felt to place them at high risk for further clinical deterioration. Furthermore, it is not anticipated that the patient will be medically stable for discharge from the hospital within 2 midnights of admission.   * I certify that at the point of admission it is my clinical judgment that the patient will require inpatient hospital care spanning beyond 2 midnights from the  point of admission due to high intensity of service, high risk for further deterioration and high frequency of surveillance required.*  Author: Andris Baumann, MD 08/14/2023 11:38 PM  For on call review [email or web address].

## 2023-08-14 NOTE — ED Notes (Signed)
Pt resting in bed. Bipap in place. Symmetric chest rise and fall noted. Pt is alert and oriented following commands. Full cardiac monitor in place. Bed low and locked. Side rails raised x2. Call bell in reach. Denies further needs at this time.

## 2023-08-14 NOTE — ED Notes (Signed)
Patient taken off Bi-PAP per verbal order by MD Mumma. Patient sats at 87%. Patient states she normally wears 2L [place] at home. Patient placed on 2L with sats at 93%. This RN noted that patient's O2 sats remain in the 90s until the patient begins talking. MD Mumma notified of this RN's findings.

## 2023-08-14 NOTE — Assessment & Plan Note (Signed)
Continue levothyroxine 

## 2023-08-14 NOTE — Assessment & Plan Note (Signed)
Continue Lamictal and Keppra.

## 2023-08-15 ENCOUNTER — Inpatient Hospital Stay: Payer: Medicare Other

## 2023-08-15 ENCOUNTER — Inpatient Hospital Stay (HOSPITAL_COMMUNITY)
Admit: 2023-08-15 | Discharge: 2023-08-15 | Disposition: A | Discharge status: Home / Self Care | Payer: Medicare Other | Attending: Internal Medicine

## 2023-08-15 DIAGNOSIS — I5032 Chronic diastolic (congestive) heart failure: Secondary | ICD-10-CM

## 2023-08-15 DIAGNOSIS — E119 Type 2 diabetes mellitus without complications: Secondary | ICD-10-CM

## 2023-08-15 DIAGNOSIS — F17210 Nicotine dependence, cigarettes, uncomplicated: Secondary | ICD-10-CM | POA: Diagnosis present

## 2023-08-15 DIAGNOSIS — I10 Essential (primary) hypertension: Secondary | ICD-10-CM | POA: Diagnosis present

## 2023-08-15 DIAGNOSIS — E785 Hyperlipidemia, unspecified: Secondary | ICD-10-CM | POA: Diagnosis present

## 2023-08-15 DIAGNOSIS — J441 Chronic obstructive pulmonary disease with (acute) exacerbation: Secondary | ICD-10-CM | POA: Diagnosis not present

## 2023-08-15 DIAGNOSIS — E1169 Type 2 diabetes mellitus with other specified complication: Secondary | ICD-10-CM

## 2023-08-15 LAB — BLOOD GAS, VENOUS
Acid-base deficit: 2.6 mmol/L — ABNORMAL HIGH (ref 0.0–2.0)
Acid-base deficit: 3.5 mmol/L — ABNORMAL HIGH (ref 0.0–2.0)
Bicarbonate: 23.7 mmol/L (ref 20.0–28.0)
Bicarbonate: 22.1 mmol/L (ref 20.0–28.0)
Delivery systems: POSITIVE
Expiratory PAP: 5 cm[H2O]
FIO2: 40 %
Inspiratory PAP: 10 cm[H2O]
O2 Saturation: 93.8 %
O2 Saturation: 93.8 %
Patient temperature: 37
Patient temperature: 37
pCO2, Ven: 46 mm[Hg] (ref 44–60)
pCO2, Ven: 41 mm[Hg] — ABNORMAL LOW (ref 44–60)
pH, Ven: 7.32 (ref 7.25–7.43)
pH, Ven: 7.34 (ref 7.25–7.43)
pO2, Ven: 69 mm[Hg] — ABNORMAL HIGH (ref 32–45)
pO2, Ven: 67 mm[Hg] — ABNORMAL HIGH (ref 32–45)

## 2023-08-15 LAB — BASIC METABOLIC PANEL
Anion gap: 14 (ref 5–15)
BUN: 18 mg/dL (ref 8–23)
CO2: 17 mmol/L — ABNORMAL LOW (ref 22–32)
Calcium: 8.8 mg/dL — ABNORMAL LOW (ref 8.9–10.3)
Chloride: 106 mmol/L (ref 98–111)
Creatinine, Ser: 0.93 mg/dL (ref 0.44–1.00)
GFR, Estimated: >60 mL/min (ref >60)
Glucose, Bld: 220 mg/dL — ABNORMAL HIGH (ref 70–99)
Potassium: 4.6 mmol/L (ref 3.5–5.1)
Sodium: 137 mmol/L (ref 135–145)

## 2023-08-15 LAB — CBC WITH DIFFERENTIAL/PLATELET
Abs Immature Granulocytes: 0.07 10*3/uL (ref 0.00–0.07)
Basophils Absolute: 0 10*3/uL (ref 0.0–0.1)
Basophils Relative: 0 %
Eosinophils Absolute: 0 10*3/uL (ref 0.0–0.5)
Eosinophils Relative: 0 %
HCT: 39.8 % (ref 36.0–46.0)
Hemoglobin: 12.9 g/dL (ref 12.0–15.0)
Immature Granulocytes: 1 %
Lymphocytes Relative: 8 %
Lymphs Abs: 0.5 10*3/uL — ABNORMAL LOW (ref 0.7–4.0)
MCH: 30.4 pg (ref 26.0–34.0)
MCHC: 32.4 g/dL (ref 30.0–36.0)
MCV: 93.9 fL (ref 80.0–100.0)
Monocytes Absolute: 0.1 10*3/uL (ref 0.1–1.0)
Monocytes Relative: 2 %
Neutro Abs: 5.6 10*3/uL (ref 1.7–7.7)
Neutrophils Relative %: 89 %
Platelets: 295 10*3/uL (ref 150–400)
RBC: 4.24 MIL/uL (ref 3.87–5.11)
RDW: 12.4 % (ref 11.5–15.5)
WBC: 6.4 10*3/uL (ref 4.0–10.5)
nRBC: 0 % (ref 0.0–0.2)

## 2023-08-15 LAB — ECHOCARDIOGRAM COMPLETE
AR max vel: 1.78 cm2
AV Area VTI: 1.9 cm2
AV Area mean vel: 1.8 cm2
AV Mean grad: 6 mm[Hg]
AV Peak grad: 10.1 mm[Hg]
Ao pk vel: 1.59 m/s
Area-P 1/2: 3.08 cm2
MV VTI: 2.36 cm2
S' Lateral: 3.2 cm
Weight: 2525.59 [oz_av]

## 2023-08-15 LAB — URINALYSIS, ROUTINE W REFLEX MICROSCOPIC
Bilirubin Urine: NEGATIVE
Glucose, UA: >=500 mg/dL — AB
Ketones, ur: NEGATIVE mg/dL
Leukocytes,Ua: NEGATIVE
Nitrite: NEGATIVE
Protein, ur: NEGATIVE mg/dL
Specific Gravity, Urine: 1.013 (ref 1.005–1.030)
Squamous Epithelial / HPF: 0 /[HPF] (ref 0–5)
pH: 5 (ref 5.0–8.0)

## 2023-08-15 LAB — HIV ANTIBODY (ROUTINE TESTING W REFLEX): HIV Screen 4th Generation wRfx: NONREACTIVE

## 2023-08-15 LAB — GLUCOSE, CAPILLARY
Glucose-Capillary: 266 mg/dL — ABNORMAL HIGH (ref 70–99)
Glucose-Capillary: 243 mg/dL — ABNORMAL HIGH (ref 70–99)

## 2023-08-15 MED ORDER — INSULIN ASPART 100 UNIT/ML IJ SOLN
0.0000 [IU] | Freq: Every day | INTRAMUSCULAR | Status: DC
  Administered 2023-08-15: 5 [IU] via SUBCUTANEOUS
  Filled 2023-08-15: qty 1

## 2023-08-15 MED ORDER — FLUTICASONE PROPIONATE 50 MCG/ACT NA SUSP
1.0000 | Freq: Every day | NASAL | Status: DC
  Administered 2023-08-16: 1 via NASAL
  Filled 2023-08-15 (×2): qty 16

## 2023-08-15 MED ORDER — IPRATROPIUM-ALBUTEROL 0.5-2.5 (3) MG/3ML IN SOLN
3.0000 mL | Freq: Three times a day (TID) | RESPIRATORY_TRACT | Status: DC
  Administered 2023-08-16: 3 mL via RESPIRATORY_TRACT
  Filled 2023-08-15: qty 3

## 2023-08-15 MED ORDER — LEVOTHYROXINE SODIUM 112 MCG PO TABS
112.0000 ug | ORAL_TABLET | Freq: Every day | ORAL | Status: DC
  Administered 2023-08-16: 112 ug via ORAL
  Filled 2023-08-15: qty 1

## 2023-08-15 MED ORDER — LITHIUM CARBONATE 150 MG PO CAPS
150.0000 mg | ORAL_CAPSULE | Freq: Every day | ORAL | Status: DC
  Administered 2023-08-16: 150 mg via ORAL
  Filled 2023-08-15 (×2): qty 1

## 2023-08-15 MED ORDER — BUSPIRONE HCL 10 MG PO TABS
30.0000 mg | ORAL_TABLET | Freq: Two times a day (BID) | ORAL | Status: DC
  Administered 2023-08-15 – 2023-08-16 (×2): 30 mg via ORAL
  Filled 2023-08-15 (×2): qty 3
  Filled 2023-08-15: qty 6

## 2023-08-15 MED ORDER — INSULIN ASPART 100 UNIT/ML IJ SOLN
0.0000 [IU] | Freq: Three times a day (TID) | INTRAMUSCULAR | Status: DC
  Administered 2023-08-16: 3 [IU] via SUBCUTANEOUS
  Filled 2023-08-15: qty 1

## 2023-08-15 MED ORDER — IOHEXOL 350 MG/ML SOLN
75.0000 mL | Freq: Once | INTRAVENOUS | Status: AC | PRN
  Administered 2023-08-15: 75 mL via INTRAVENOUS

## 2023-08-15 MED ORDER — RISPERIDONE 0.5 MG PO TABS
3.0000 mg | ORAL_TABLET | Freq: Every day | ORAL | Status: DC
  Administered 2023-08-15: 3 mg via ORAL
  Filled 2023-08-15: qty 6

## 2023-08-15 MED ORDER — FLUTICASONE FUROATE-VILANTEROL 100-25 MCG/ACT IN AEPB
1.0000 | INHALATION_SPRAY | Freq: Every day | RESPIRATORY_TRACT | Status: DC
  Administered 2023-08-16: 1 via RESPIRATORY_TRACT
  Filled 2023-08-15: qty 28

## 2023-08-15 MED ORDER — UMECLIDINIUM BROMIDE 62.5 MCG/ACT IN AEPB
1.0000 | INHALATION_SPRAY | Freq: Every day | RESPIRATORY_TRACT | Status: DC
  Administered 2023-08-16: 1 via RESPIRATORY_TRACT
  Filled 2023-08-15 (×2): qty 7

## 2023-08-15 MED ORDER — LORATADINE 10 MG PO TABS
10.0000 mg | ORAL_TABLET | Freq: Every day | ORAL | Status: DC
  Administered 2023-08-16: 10 mg via ORAL
  Filled 2023-08-15: qty 1

## 2023-08-15 MED ORDER — NICOTINE 21 MG/24HR TD PT24
21.0000 mg | MEDICATED_PATCH | Freq: Every day | TRANSDERMAL | Status: DC
  Administered 2023-08-16 (×2): 21 mg via TRANSDERMAL
  Filled 2023-08-15 (×3): qty 1

## 2023-08-15 NOTE — ED Notes (Signed)
Spoke with nursing home and gave updates to staff. Staff is requesting a swallow study d/t Pt eating so fast and requesting swallow study, MD notified

## 2023-08-15 NOTE — Progress Notes (Signed)
Progress Note   Patient: Tammy [name] ZOX:[id number] DOB: 1961/03/08 DOA: 08/14/2023     1 DOS: the patient was seen and examined on 08/15/2023   Brief hospital course: 62yo with h/o seizure d/o, bipolar d/o, hypothyroidism, HFpEF, and COPD with chronic respiratory failure on 2L home O2 who presented on 11/7 with cough and SOB.  She was placed on BIPAP on arrival due to respiratory distress.  Negative CXR.  Presentation c/w COPD exacerbation.  Assessment and Plan:  Acute on chronic respiratory failure associated with a COPD exacerbation Patient's shortness of breath and productive cough are most likely caused by acute COPD exacerbation.  She has history of O2-dependent COPD but was in distress on arrival and placed on BIPAP; she has been weaned to 5L [place] O2 at this time She does not have fever or leukocytosis  Chest x-ray is not consistent with pneumonia Will admit patient to med surg Nebulizers: scheduled Duoneb and prn albuterol Solu-Medrol 40 mg IV BID -> Prednisone 40 mg PO daily She was started on Ceftriaxone Continue Trelegy Coordinated care with [facility] team/PT/OT/Nutrition/RT consults CTA chest negative for PE, showing changes of PAH Flutter valve, mucolytics also ordered SLP evaluation ordered - family reports concern about possible swallow dysfunction/aspiration   Chronic diastolic CHF (congestive heart failure) (HCC) Clinically euvolemic with BNP 37, however CTA chest showing mild superimposed interstitial and alveolar pulmonary edema Continue carvedilol Can consider Lasix Last EF over 65% 2018 Daily weights with intake and output monitoring 11/8 echo with preserved EF, grade 1 DD, moderate AR Hold [place] Pepper, Kerendia for now   Bipolar mood disorder (HCC) Continue buspirone, lamotrigine, lithium, risperidone, Requip    Hypothyroidism Continue levothyroxine   Seizure disorder (HCC) Continue Lamictal and Keppra  HTN Continue carvedilol  DM Will check A1c hold  Glucophage, Jardiance Cover with moderate-scale SSI   HLD Continue pravastatin  Tobacco dependence Reports smoking 2 ppd Encourage cessation.   This was discussed with the patient and should be reviewed on an ongoing basis.   Patch ordered    Consultants: TOC team PT/OT Nutrition RT SLP  Procedures: None  Antibiotics: Doxycycline x 1 Ceftriaxone 11/8-  30 Day Unplanned Readmission Risk Score    Flowsheet Row ED to Hosp-Admission (Current) from 08/14/2023 in [facility] Emergency Department at [facility]  30 Day Unplanned Readmission Risk Score (%) 16.66 Filed at 08/15/2023 0801       This score is the patient's risk of an unplanned readmission within 30 days of being discharged (0 -100%). The score is based on dignosis, age, lab data, medications, orders, and past utilization.   Low:  0-14.9   Medium: 15-21.9   High: 22-29.9   Extreme: 30 and above           Subjective: She was comfortable at the time of my evaluation and on 5L [place] O2 (no longer on BIPAP).  She reports smoking 2 ppd.   Objective: Vitals:   08/15/23 1030 08/15/23 1415  BP: 134/60 112/68  Pulse: 83 75  Resp: (!) 26 16  Temp: 98.2 F (36.8 C)   SpO2: 95% 94%    Intake/Output Summary (Last 24 hours) at 08/15/2023 1503 Last data filed at 08/15/2023 0208 Gross per 24 hour  Intake 600 ml  Output --  Net 600 ml   Filed Weights   08/14/23 2036  Weight: 71.6 kg    Exam:  General:  Appears calm and comfortable and is in NAD, mildly disheveled Eyes:  EOMI, normal lids, iris  ENT:  grossly normal hearing, lips & tongue, mmm;edentulous Neck:  no LAD, masses or thyromegaly Cardiovascular:  RRR, no m/r/g. No LE edema.  Respiratory:   Scattered wheezing with moderate air movement.  Normal respiratory effort. Abdomen:  soft, NT, ND Skin:  no rash or induration seen on limited exam Musculoskeletal:  grossly normal tone BUE/BLE, good ROM, no bony abnormality Psychiatric:  chronic mood  and affect impairment ("I'm weird"), speech fluent and appropriate, AOx3 Neurologic:  CN 2-12 grossly intact, moves all extremities in coordinated fashion  Data Reviewed: I have reviewed the patient's lab results since admission.  Pertinent labs for today include:  VBG: 7.34/41/22.1 CO2 17 Glucose 220 Normal CBC UA: >500 glucose, small Hgb    Family Communication: None present; I was unable to reach her friend by telephone  Disposition: Status is: Inpatient Remains inpatient appropriate because: ongoing evaluation and treatment  Planned Discharge Destination:  TBD    Time spent: 50 minutes  Author: Jonah Blue, MD 08/15/2023 3:03 PM  For on call review [email or web address].

## 2023-08-15 NOTE — ED Notes (Addendum)
Patient stating she needs to use the restroom at this time. Patient placed on bedpan and urine sample obtained. Patient's brief changed and new paper pads were placed under the patient. Patient was given a clean, warm blanket. Bed alarm currently activated and call light is within reach.

## 2023-08-15 NOTE — Progress Notes (Signed)
*  PRELIMINARY RESULTS* Echocardiogram 2D Echocardiogram has been performed.  Carolyne Fiscal 08/15/2023, 12:29 PM

## 2023-08-15 NOTE — ED Notes (Signed)
Patient found to be sitting on the end of the bed. This RN informed patient that she needs to lay back in the bed due to safety concerns because patient is a fall risk and is attached to oxygen. Patient slid back in bed and positioned herself comfortably. Patient reminded that she may not get up without assistance due to her being at risk for falling. Patient remains in bed at this time connected to the cardiac monitor and oxygen. Patient is currently wearing a fall risk band and non-skid socks. The posey alarm is activated. Patient has all personal belongings within reach and call bell is within reach.

## 2023-08-15 NOTE — ED Notes (Signed)
This CNA responded to pt's call light. Pt was found standing up beside bed with brief on the floor and oxygen tubing on floor. Pt's bed sheets were changed due to being soiled, gown placed on pt due to personal gown being soiled. Pt is resting back in bed, new brief applied to patient, oxygen back on patient. Call bell within reach, bed alarm set and reminded pt to not get out of bed without assistance and to use call bell when needed.

## 2023-08-15 NOTE — ED Notes (Signed)
Patient in CT at this time.

## 2023-08-15 NOTE — ED Notes (Signed)
Changed Pts brief and bed at this time. New VSS obtained, call light and bed alarm in reach

## 2023-08-15 NOTE — ED Notes (Addendum)
Patient changed and provided peri care after an episode of urinary incontinence. Patient's soiled brief was disposed of and patient was placed in a new brief. New paper pads were also placed under the patient. The patient was repositioned in bed and the call light was placed within reach.

## 2023-08-15 NOTE — Plan of Care (Signed)
  Problem: Education: Goal: Knowledge of disease or condition will improve Outcome: Progressing Goal: Knowledge of the prescribed therapeutic regimen will improve Outcome: Progressing Goal: Individualized Educational Video(s) Outcome: Progressing   Problem: Activity: Goal: Ability to tolerate increased activity will improve Outcome: Progressing Goal: Will verbalize the importance of balancing activity with adequate rest periods Outcome: Progressing   Problem: Respiratory: Goal: Ability to maintain a clear airway will improve Outcome: Progressing Goal: Levels of oxygenation will improve Outcome: Progressing Goal: Ability to maintain adequate ventilation will improve Outcome: Progressing   Problem: Education: Goal: Ability to describe self-care measures that may prevent or decrease complications (Diabetes Survival Skills Education) will improve Outcome: Progressing Goal: Individualized Educational Video(s) Outcome: Progressing   Problem: Coping: Goal: Ability to adjust to condition or change in health will improve Outcome: Progressing   Problem: Fluid Volume: Goal: Ability to maintain a balanced intake and output will improve Outcome: Progressing   Problem: Health Behavior/Discharge Planning: Goal: Ability to identify and utilize available resources and services will improve Outcome: Progressing Goal: Ability to manage health-related needs will improve Outcome: Progressing   Problem: Metabolic: Goal: Ability to maintain appropriate glucose levels will improve Outcome: Progressing   Problem: Nutritional: Goal: Maintenance of adequate nutrition will improve Outcome: Progressing Goal: Progress toward achieving an optimal weight will improve Outcome: Progressing   Problem: Skin Integrity: Goal: Risk for impaired skin integrity will decrease Outcome: Progressing   Problem: Tissue Perfusion: Goal: Adequacy of tissue perfusion will improve Outcome: Progressing    Problem: Education: Goal: Knowledge of General Education information will improve Description: Including pain rating scale, medication(s)/side effects and non-pharmacologic comfort measures Outcome: Progressing   Problem: Health Behavior/Discharge Planning: Goal: Ability to manage health-related needs will improve Outcome: Progressing   Problem: Clinical Measurements: Goal: Ability to maintain clinical measurements within normal limits will improve Outcome: Progressing Goal: Will remain free from infection Outcome: Progressing Goal: Diagnostic test results will improve Outcome: Progressing Goal: Respiratory complications will improve Outcome: Progressing Goal: Cardiovascular complication will be avoided Outcome: Progressing   Problem: Activity: Goal: Risk for activity intolerance will decrease Outcome: Progressing   Problem: Nutrition: Goal: Adequate nutrition will be maintained Outcome: Progressing   Problem: Coping: Goal: Level of anxiety will decrease Outcome: Progressing   Problem: Elimination: Goal: Will not experience complications related to bowel motility Outcome: Progressing Goal: Will not experience complications related to urinary retention Outcome: Progressing   Problem: Pain Management: Goal: General experience of comfort will improve Outcome: Progressing   Problem: Safety: Goal: Ability to remain free from injury will improve Outcome: Progressing   Problem: Skin Integrity: Goal: Risk for impaired skin integrity will decrease Outcome: Progressing

## 2023-08-16 DIAGNOSIS — J441 Chronic obstructive pulmonary disease with (acute) exacerbation: Secondary | ICD-10-CM | POA: Diagnosis not present

## 2023-08-16 LAB — BASIC METABOLIC PANEL
Anion gap: 8 (ref 5–15)
BUN: 25 mg/dL — ABNORMAL HIGH (ref 8–23)
CO2: 25 mmol/L (ref 22–32)
Calcium: 9.2 mg/dL (ref 8.9–10.3)
Chloride: 107 mmol/L (ref 98–111)
Creatinine, Ser: 0.93 mg/dL (ref 0.44–1.00)
GFR, Estimated: >60 mL/min (ref >60)
Glucose, Bld: 118 mg/dL — ABNORMAL HIGH (ref 70–99)
Potassium: 4.3 mmol/L (ref 3.5–5.1)
Sodium: 140 mmol/L (ref 135–145)

## 2023-08-16 LAB — CBC WITH DIFFERENTIAL/PLATELET
Abs Immature Granulocytes: 0.05 10*3/uL (ref 0.00–0.07)
Basophils Absolute: 0 10*3/uL (ref 0.0–0.1)
Basophils Relative: 0 %
Eosinophils Absolute: 0.1 10*3/uL (ref 0.0–0.5)
Eosinophils Relative: 1 %
HCT: 38.8 % (ref 36.0–46.0)
Hemoglobin: 12.6 g/dL (ref 12.0–15.0)
Immature Granulocytes: 1 %
Lymphocytes Relative: 16 %
Lymphs Abs: 1.2 10*3/uL (ref 0.7–4.0)
MCH: 30.7 pg (ref 26.0–34.0)
MCHC: 32.5 g/dL (ref 30.0–36.0)
MCV: 94.4 fL (ref 80.0–100.0)
Monocytes Absolute: 0.5 10*3/uL (ref 0.1–1.0)
Monocytes Relative: 7 %
Neutro Abs: 5.5 10*3/uL (ref 1.7–7.7)
Neutrophils Relative %: 75 %
Platelets: 294 10*3/uL (ref 150–400)
RBC: 4.11 MIL/uL (ref 3.87–5.11)
RDW: 12.4 % (ref 11.5–15.5)
WBC: 7.3 10*3/uL (ref 4.0–10.5)
nRBC: 0 % (ref 0.0–0.2)

## 2023-08-16 LAB — HEMOGLOBIN A1C
Hgb A1c MFr Bld: 6.5 % — ABNORMAL HIGH (ref 4.8–5.6)
Mean Plasma Glucose: 139.85 mg/dL

## 2023-08-16 LAB — GLUCOSE, CAPILLARY
Glucose-Capillary: 100 mg/dL — ABNORMAL HIGH (ref 70–99)
Glucose-Capillary: 154 mg/dL — ABNORMAL HIGH (ref 70–99)

## 2023-08-16 MED ORDER — PREDNISONE 20 MG PO TABS: 40.0000 mg | ORAL_TABLET | Freq: Every day | ORAL | 0 refills | Status: AC

## 2023-08-16 MED ORDER — NICOTINE 21 MG/24HR TD PT24: 21.0000 mg | MEDICATED_PATCH | Freq: Every day | TRANSDERMAL | 0 refills | Status: DC

## 2023-08-16 MED ORDER — DOXYCYCLINE HYCLATE 100 MG PO TABS: 100.0000 mg | ORAL_TABLET | Freq: Two times a day (BID) | ORAL | 0 refills | Status: AC

## 2023-08-16 NOTE — Evaluation (Signed)
Physical Therapy Co-Evaluation with OT Patient Details Name: Tammy [name] MRN: [id number] DOB: September 17, 1961 Today's Date: 08/16/2023  History of Present Illness  Tammy [name] is a 62 y.o. female with medical history significant for Seizure disorder, bipolar mood disorder, hypothyroidism, diastolic CHF (EF> 65% 06/2017), COPD on home O2 at 2 L, being admitted for respiratory failure secondary to COPD exacerbation. Upon admission she was placed on Bi-pap, she has since been weaned back to 3L O2;  Clinical Impression  62 yo Female presents with acute exacerbation of COPD. Patient was living at [facility]. Maurine Minister is Astronomer and is her POA for assisted with history intake per phone call. Patient has 24/7 care and uses a RW for ambulation. She has a care attendant that will supervise and assist her with all mobility. She also has care for bathing and dressing. Patient is oriented to self. She was pleasant and agreeable to PT/OT evaluation. Pt demonstrates increased impulsivity and frequent ataxic/uncoordinated movements. She has resting tremors in bilateral UE hands. She was on 3L O2, however PT identified that nasal cannula was not properly placed. Initial Spo2 was 88%, HR 97 when sitting edge of bed. OT increased supplemental O2 to 4L and Spo2 quickly rebounded to low 90's. Patient does require cues for deep breathing inhale through nose, exhale through mouth for optimal oxygen intake. Pt requires min A for safety with sit to stand transfers. She ambulated with RW requiring min-mod A for safety with ambulation. When walking, patient is impulsive and demonstrates no safety awareness, turning/veering towards walls/obstacles. She required physical assistance for RW management for direction and safety. Patient ambulated 75-100 feet with 3L O2, spo2 dropped to 80%, HR increased 117. She was directed to chair for rest break. Once seated, Spo2 increased to 85% within 1 min and then low  90's within 2 minutes. She  continues to require verbal cues for deep breathing through nose for optimal oxygen intake. When sitting in chair, patient is frequently scooting/repositioning. PT applied chair alarm, elevated legs and positioned tray in front of patient for safety. Call bell was in reach. RN notified of patient's position and high fall risk. Pt would benefit from additional skilled PT Intervention to improve strength, balance and mobility. After evaluation, PT spoke with Maurine Minister (POA and administrator from group home) and informed him of patient's current mobility. He expressed this is her baseline and did not feel she needed any additional skilled intervention upon discharge. He confirmed she has 24/7 care at group home.       If plan is discharge home, recommend the following: A little help with walking and/or transfers;Assistance with cooking/housework;Assistance with feeding;Direct supervision/assist for medications management;Direct supervision/assist for financial management;Assist for transportation;Help with stairs or ramp for entrance;Supervision due to cognitive status   Can travel by private vehicle        Equipment Recommendations None recommended by PT  Recommendations for Other Services       Functional Status Assessment Patient has had a recent decline in their functional status and demonstrates the ability to make significant improvements in function in a reasonable and predictable amount of time.     Precautions / Restrictions Precautions Precautions: Fall Restrictions Weight Bearing Restrictions: No      Mobility  Bed Mobility Overal bed mobility: Needs Assistance Bed Mobility: Supine to Sit     Supine to sit: Supervision     General bed mobility comments: pt impulsive and quick to sit up on side of bed  or lay down in bed; supervision for safety awareness Patient Response: Impulsive  Transfers Overall transfer level: Needs assistance Equipment  used: Rolling walker (2 wheels) Transfers: Sit to/from Stand Sit to Stand: Min assist           General transfer comment: requires min A for safety given increased impulsivity and lack of safety awareness    Ambulation/Gait Ambulation/Gait assistance: Mod assist, +2 physical assistance Gait Distance (Feet): 100 Feet Assistive device: Rolling walker (2 wheels) Gait Pattern/deviations: Step-through pattern, Narrow base of support, Drifts right/left       General Gait Details: pt very impulsive, ambulates with narrow base of support, reciprocal pattern with poor RW management, requiring verbal and tactile cues and therapist assist to manage RW for direction to avoid running into walls/doorways. Pt demonstrates minimal to no safety awareness.  Stairs            Wheelchair Mobility     Tilt Bed Tilt Bed Patient Response: Impulsive  Modified Rankin (Stroke Patients Only)       Balance Overall balance assessment: Needs assistance Sitting-balance support: Feet supported Sitting balance-Leahy Scale: Fair Sitting balance - Comments: demonstrates frequent ataxic movement and impulsivity often repositioning when sitting   Standing balance support: Bilateral upper extremity supported Standing balance-Leahy Scale: Fair Standing balance comment: uses RW, requires min-mod A for balance control and physical assistance to manage RW position, often veering side/side; very impulsive                             Pertinent Vitals/Pain Pain Assessment Pain Assessment: No/denies pain    Home Living Family/patient expects to be discharged to:: Group home Living Arrangements: Group Home Available Help at Discharge: Available 24 hours/day Type of Home: House Home Access: Ramped entrance     Alternate Level Stairs-Number of Steps: spoke with Maurine Minister Clinical research associate of group home) he reports residents are not allowed on the 2nd floor Home Layout: Two level;Able to live on  main level with bedroom/bathroom Home Equipment: Rolling Walker (2 wheels);Grab bars - toilet;Grab bars - tub/shower Additional Comments: Per Geophysical data processor of group home), 24/7 in home care    Prior Function Prior Level of Function : Needs assist  Cognitive Assist : Mobility (cognitive)     Physical Assist : Mobility (physical) Mobility (physical): Gait   Mobility Comments: uses RW, would need assistance for safety as she will veer and bump into things ADLs Comments: per Maurine Minister Clinical research associate at group home) she gets assistance with bathing/dressing, meals are all provided;     Extremity/Trunk Assessment        Lower Extremity Assessment Lower Extremity Assessment: Overall WFL for tasks assessed;Generalized weakness (exhibits ataxic movement/uncoordination but strength appears functional)    Cervical / Trunk Assessment Cervical / Trunk Assessment: Kyphotic  Communication   Communication Communication: No apparent difficulties Cueing Techniques: Verbal cues;Gestural cues;Tactile cues  Cognition   Behavior During Therapy: WFL for tasks assessed/performed Overall Cognitive Status: Difficult to assess                                 General Comments: oriented to self; has history of cognitive impairment        General Comments General comments (skin integrity, edema, etc.): no skin breakdown identified    Exercises     Assessment/Plan    PT Assessment Patient needs continued PT services  PT Problem List  Decreased coordination;Cardiopulmonary status limiting activity;Decreased activity tolerance;Decreased balance;Decreased safety awareness;Decreased mobility       PT Treatment Interventions DME instruction;Balance training;Gait training;Neuromuscular re-education;Functional mobility training;Patient/family education;Therapeutic activities;Therapeutic exercise    PT Goals (Current goals can be found in the Care Plan section)  Acute Rehab PT  Goals Patient Stated Goal: none stated/ pt confused PT Goal Formulation: With patient Time For Goal Achievement: 08/30/23 Potential to Achieve Goals: Fair    Frequency Min 1X/week     Co-evaluation PT/OT/SLP Co-Evaluation/Treatment: Yes Reason for Co-Treatment: Complexity of the patient's impairments (multi-system involvement);Necessary to address cognition/behavior during functional activity;For patient/therapist safety;To address functional/ADL transfers PT goals addressed during session: Mobility/safety with mobility;Balance;Proper use of DME OT goals addressed during session: ADL's and self-care;Proper use of Adaptive equipment and DME       AM-PAC PT "6 Clicks" Mobility  Outcome Measure Help needed turning from your back to your side while in a flat bed without using bedrails?: None Help needed moving from lying on your back to sitting on the side of a flat bed without using bedrails?: A Little Help needed moving to and from a bed to a chair (including a wheelchair)?: A Little Help needed standing up from a chair using your arms (e.g., wheelchair or bedside chair)?: A Little Help needed to walk in hospital room?: A Little Help needed climbing 3-5 steps with a railing? : A Lot 6 Click Score: 18    End of Session Equipment Utilized During Treatment: Gait belt Activity Tolerance: Patient tolerated treatment well;No increased pain Patient left: in chair;with call bell/phone within reach;with chair alarm set Nurse Communication: Mobility status PT Visit Diagnosis: Unsteadiness on feet (R26.81);Other abnormalities of gait and mobility (R26.89)    Time: 1610-9604 PT Time Calculation (min) (ACUTE ONLY): 22 min   Charges:   PT Evaluation $PT Eval Low Complexity: 1 Low   PT General Charges $$ ACUTE PT VISIT: 1 Visit          Cyanna Neace PT, DPT 08/16/2023, 9:47 AM

## 2023-08-16 NOTE — Evaluation (Signed)
Clinical/Bedside Swallow Evaluation Patient Details  Name: Tammy [name] MRN: [id number] Date of Birth: [birth year]  Today's Date: 08/16/2023 Time: SLP Start Time (ACUTE ONLY): 0945 SLP Stop Time (ACUTE ONLY): 1005 SLP Time Calculation (min) (ACUTE ONLY): 20 min  Past Medical History:  Past Medical History:  Diagnosis Date   Anxiety    Asthma    Bipolar 1 disorder (HCC)    CHF (congestive heart failure) (HCC)    COPD (chronic obstructive pulmonary disease) (HCC)    Dementia (HCC)    Epilepsy (HCC)    Seizures (HCC)    Past Surgical History:  Past Surgical History:  Procedure Laterality Date   APPENDECTOMY     hernia reapir     x 3     HPI:  Tammy [name] is a 62 y.o. female with medical history significant for Seizure disorder, bipolar mood disorder, hypothyroidism, diastolic CHF (EF> 65% 06/2017), COPD on home O2 at 2 L, being admitted for respiratory failure secondary to COPD exacerbation. CT chest, "1. No pulmonary embolism.  2. Extensive multi-vessel coronary artery calcification.  3. Marked enlargement of the central pulmonary arteries in keeping  with changes of pulmonary arterial hypertension.  4. Moderate emphysema.  5. Mild superimposed interstitial and alveolar pulmonary edema.  6. 4 mm subpleural pulmonary nodule within the right upper lobe,  indeterminate, minimally enlarged since prior examination. This is  likely benign given its minimal change since prior examination  further follow-up is not required.  7. Borderline pathologic mediastinal adenopathy, nonspecific and may  be reactive in nature though a low-grade lymphoproliferative process  could appear similarly. Follow-up evaluation in 3-6 months following  resolution of patient's acute issues be helpful in documenting  resolution."    Assessment / Plan / Recommendation  Clinical Impression  Pt seen for clinical swallowing evaluation. Pt presents with s/sx moderate oral dysphagia c/b prolonged/inefficient and  incomplete mastication of solids with oral holding and significant residual which minimally cleared with liquid wash. Pt cued to expectorate. Oral phase was functional with other consistencies. Oral deficits likely secondary to dental status and exacerbated by mental status/behavior. Pharyngeal swallow appeared [facility]; however, is at increased risk given mental status, dental status, respiratory status, and comorbidites. Recommend diet downgrade to pureed diet with thin liquids with safe swallowing strategies/aspiration precautions as outlined below. SLP to f/u per POC for diet tolerance and trials of upgraded textures.  SLP Visit Diagnosis: Dysphagia, oral phase (R13.11)    Aspiration Risk  Mild aspiration risk;Moderate aspiration risk    Diet Recommendation Dysphagia 1 (Puree);Thin liquid    Liquid Administration via: Cup;Straw Medication Administration: Whole meds with liquid Supervision: Patient able to self feed;Staff to assist with self feeding;Full supervision/cueing for compensatory strategies Compensations: Minimize environmental distractions;Slow rate;Small sips/bites Postural Changes: Seated upright at 90 degrees;Remain upright for at least 30 minutes after po intake    Other  Recommendations Oral Care Recommendations: Oral care BID;Staff/trained caregiver to provide oral care    Recommendations for follow up therapy are one component of a multi-disciplinary discharge planning process, led by the attending physician.  Recommendations may be updated based on patient status, additional functional criteria and insurance authorization.  Follow up Recommendations Follow physician's recommendations for discharge plan and follow up therapies      Assistance Recommended at Discharge    Functional Status Assessment Patient has had a recent decline in their functional status and demonstrates the ability to make significant improvements in function in a reasonable and predictable amount of time.  Frequency and Duration min 2x/week  2 weeks       Prognosis Prognosis for improved oropharyngeal function: Fair Barriers to Reach Goals: Behavior;Cognitive deficits      Swallow Study   General Date of Onset: 08/14/23 HPI: Tammy [name] is a 62 y.o. female with medical history significant for Seizure disorder, bipolar mood disorder, hypothyroidism, diastolic CHF (EF> 65% 06/2017), COPD on home O2 at 2 L, being admitted for respiratory failure secondary to COPD exacerbation. CT chest, "1. No pulmonary embolism.  2. Extensive multi-vessel coronary artery calcification.  3. Marked enlargement of the central pulmonary arteries in keeping  with changes of pulmonary arterial hypertension.  4. Moderate emphysema.  5. Mild superimposed interstitial and alveolar pulmonary edema.  6. 4 mm subpleural pulmonary nodule within the right upper lobe,  indeterminate, minimally enlarged since prior examination. This is  likely benign given its minimal change since prior examination  further follow-up is not required.  7. Borderline pathologic mediastinal adenopathy, nonspecific and may  be reactive in nature though a low-grade lymphoproliferative process  could appear similarly. Follow-up evaluation in 3-6 months following  resolution of patient's acute issues be helpful in documenting  resolution." Type of Study: Bedside Swallow Evaluation Previous Swallow Assessment: none Diet Prior to this Study: Regular;Thin liquids (Level 0) Temperature Spikes Noted: No Respiratory Status: Nasal cannula History of Recent Intubation: No Behavior/Cognition: Alert;Doesn't follow directions;Requires cueing;Distractible;Impulsive;Confused Oral Cavity Assessment: Within Functional Limits Oral Care Completed by SLP: Yes Oral Cavity - Dentition: Edentulous Vision: Functional for self-feeding Self-Feeding Abilities: Able to feed self Patient Positioning: Upright in bed Baseline Vocal Quality: Normal Volitional Cough:  Strong Volitional Swallow: Able to elicit    Oral/Motor/Sensory Function Overall Oral Motor/Sensory Function: Within functional limits   Ice Chips Ice chips: Not tested   Thin Liquid Thin Liquid: Within functional limits Presentation: Cup;Straw;Self Fed    Nectar Thick Nectar Thick Liquid: Not tested   Honey Thick Honey Thick Liquid: Not tested   Puree Puree: Within functional limits Presentation: Self Fed   Solid     Solid: Impaired Oral Phase Impairments: Poor awareness of bolus;Impaired mastication Oral Phase Functional Implications: Prolonged oral transit;Oral holding;Oral residue;Impaired mastication Pharyngeal Phase Impairments:  (WFL)     Clyde Canterbury, M.S., CCC-SLP Speech-Language Pathologist [facility] [phone] (ASCOM)  Tammy [name] 08/16/2023,10:17 AM

## 2023-08-16 NOTE — Plan of Care (Signed)
Problem: Education: Goal: Knowledge of disease or condition will improve 08/16/2023 0808 by Latanya Maudlin, RN Outcome: Progressing 08/15/2023 1842 by Latanya Maudlin, RN Outcome: Progressing Goal: Knowledge of the prescribed therapeutic regimen will improve 08/16/2023 0808 by Latanya Maudlin, RN Outcome: Progressing 08/15/2023 1842 by Latanya Maudlin, RN Outcome: Progressing Goal: Individualized Educational Video(s) 08/16/2023 0808 by Latanya Maudlin, RN Outcome: Progressing 08/15/2023 1842 by Latanya Maudlin, RN Outcome: Progressing   Problem: Activity: Goal: Ability to tolerate increased activity will improve 08/16/2023 0808 by Latanya Maudlin, RN Outcome: Progressing 08/15/2023 1842 by Latanya Maudlin, RN Outcome: Progressing Goal: Will verbalize the importance of balancing activity with adequate rest periods 08/16/2023 0808 by Latanya Maudlin, RN Outcome: Progressing 08/15/2023 1842 by Latanya Maudlin, RN Outcome: Progressing   Problem: Respiratory: Goal: Ability to maintain a clear airway will improve 08/16/2023 0808 by Latanya Maudlin, RN Outcome: Progressing 08/15/2023 1842 by Latanya Maudlin, RN Outcome: Progressing Goal: Levels of oxygenation will improve 08/16/2023 0808 by Latanya Maudlin, RN Outcome: Progressing 08/15/2023 1842 by Latanya Maudlin, RN Outcome: Progressing Goal: Ability to maintain adequate ventilation will improve 08/16/2023 0808 by Latanya Maudlin, RN Outcome: Progressing 08/15/2023 1842 by Latanya Maudlin, RN Outcome: Progressing   Problem: Education: Goal: Ability to describe self-care measures that may prevent or decrease complications (Diabetes Survival Skills Education) will improve 08/16/2023 0808 by Latanya Maudlin, RN Outcome: Progressing 08/15/2023 1842 by Latanya Maudlin, RN Outcome: Progressing Goal: Individualized Educational Video(s) 08/16/2023 0808 by Latanya Maudlin, RN Outcome: Progressing 08/15/2023 1842 by Latanya Maudlin, RN Outcome: Progressing   Problem: Coping: Goal: Ability to adjust to condition or change in health will improve 08/16/2023 0808 by Latanya Maudlin, RN Outcome: Progressing 08/15/2023 1842 by Latanya Maudlin, RN Outcome: Progressing   Problem: Fluid Volume: Goal: Ability to maintain a balanced intake and output will improve 08/16/2023 0808 by Latanya Maudlin, RN Outcome: Progressing 08/15/2023 1842 by Latanya Maudlin, RN Outcome: Progressing   Problem: Health Behavior/Discharge Planning: Goal: Ability to identify and utilize available resources and services will improve 08/16/2023 0808 by Latanya Maudlin, RN Outcome: Progressing 08/15/2023 1842 by Latanya Maudlin, RN Outcome: Progressing Goal: Ability to manage health-related needs will improve 08/16/2023 0808 by Latanya Maudlin, RN Outcome: Progressing 08/15/2023 1842 by Latanya Maudlin, RN Outcome: Progressing   Problem: Metabolic: Goal: Ability to maintain appropriate glucose levels will improve 08/16/2023 0808 by Latanya Maudlin, RN Outcome: Progressing 08/15/2023 1842 by Latanya Maudlin, RN Outcome: Progressing   Problem: Nutritional: Goal: Maintenance of adequate nutrition will improve 08/16/2023 0808 by Latanya Maudlin, RN Outcome: Progressing 08/15/2023 1842 by Latanya Maudlin, RN Outcome: Progressing Goal: Progress toward achieving an optimal weight will improve 08/16/2023 0808 by Latanya Maudlin, RN Outcome: Progressing 08/15/2023 1842 by Latanya Maudlin, RN Outcome: Progressing   Problem: Skin Integrity: Goal: Risk for impaired skin integrity will decrease 08/16/2023 0808 by Latanya Maudlin, RN Outcome: Progressing 08/15/2023 1842 by Latanya Maudlin, RN Outcome: Progressing   Problem: Tissue Perfusion: Goal: Adequacy of tissue perfusion will improve 08/16/2023 0808 by Latanya Maudlin, RN Outcome: Progressing 08/15/2023 1842 by Latanya Maudlin, RN Outcome: Progressing   Problem:  Education: Goal: Knowledge of General Education information will improve Description: Including pain rating scale, medication(s)/side effects and non-pharmacologic comfort measures 08/16/2023 0808 by Latanya Maudlin, RN Outcome: Progressing 08/15/2023 1842 by Latanya Maudlin, RN Outcome:  Progressing   Problem: Health Behavior/Discharge Planning: Goal: Ability to manage health-related needs will improve 08/16/2023 0808 by Latanya Maudlin, RN Outcome: Progressing 08/15/2023 1842 by Latanya Maudlin, RN Outcome: Progressing   Problem: Clinical Measurements: Goal: Ability to maintain clinical measurements within normal limits will improve 08/16/2023 0808 by Latanya Maudlin, RN Outcome: Progressing 08/15/2023 1842 by Latanya Maudlin, RN Outcome: Progressing Goal: Will remain free from infection 08/16/2023 0808 by Latanya Maudlin, RN Outcome: Progressing 08/15/2023 1842 by Latanya Maudlin, RN Outcome: Progressing Goal: Diagnostic test results will improve 08/16/2023 0808 by Latanya Maudlin, RN Outcome: Progressing 08/15/2023 1842 by Latanya Maudlin, RN Outcome: Progressing Goal: Respiratory complications will improve 08/16/2023 0808 by Latanya Maudlin, RN Outcome: Progressing 08/15/2023 1842 by Latanya Maudlin, RN Outcome: Progressing Goal: Cardiovascular complication will be avoided 08/16/2023 0808 by Latanya Maudlin, RN Outcome: Progressing 08/15/2023 1842 by Latanya Maudlin, RN Outcome: Progressing   Problem: Activity: Goal: Risk for activity intolerance will decrease 08/16/2023 0808 by Latanya Maudlin, RN Outcome: Progressing 08/15/2023 1842 by Latanya Maudlin, RN Outcome: Progressing   Problem: Nutrition: Goal: Adequate nutrition will be maintained 08/16/2023 0808 by Latanya Maudlin, RN Outcome: Progressing 08/15/2023 1842 by Latanya Maudlin, RN Outcome: Progressing   Problem: Coping: Goal: Level of anxiety will decrease 08/16/2023 0808 by Latanya Maudlin,  RN Outcome: Progressing 08/15/2023 1842 by Latanya Maudlin, RN Outcome: Progressing   Problem: Elimination: Goal: Will not experience complications related to bowel motility 08/16/2023 0808 by Latanya Maudlin, RN Outcome: Progressing 08/15/2023 1842 by Latanya Maudlin, RN Outcome: Progressing Goal: Will not experience complications related to urinary retention 08/16/2023 0808 by Latanya Maudlin, RN Outcome: Progressing 08/15/2023 1842 by Latanya Maudlin, RN Outcome: Progressing   Problem: Pain Management: Goal: General experience of comfort will improve 08/16/2023 0808 by Latanya Maudlin, RN Outcome: Progressing 08/15/2023 1842 by Latanya Maudlin, RN Outcome: Progressing   Problem: Safety: Goal: Ability to remain free from injury will improve 08/16/2023 0808 by Latanya Maudlin, RN Outcome: Progressing 08/15/2023 1842 by Latanya Maudlin, RN Outcome: Progressing   Problem: Skin Integrity: Goal: Risk for impaired skin integrity will decrease 08/16/2023 0808 by Latanya Maudlin, RN Outcome: Progressing 08/15/2023 1842 by Latanya Maudlin, RN Outcome: Progressing

## 2023-08-16 NOTE — Discharge Summary (Signed)
Physician Discharge Summary   Patient: Tammy [name] MRN: [id number] DOB: [birth year]  Admit date:     08/14/2023  Discharge date: 08/16/23  Discharge Physician: Jonah Blue   PCP: Armando Gang, FNP   Recommendations at discharge:   She is recommended to have a dysphagia 1 diet with pureed food with close supervision while eating Complete doxycycline x 4 more days Complete prednisone x 4 more days STOP smoking!  Patch provided  Discharge Diagnoses: Principal Problem:   COPD with acute exacerbation (HCC) Active Problems:   Acute on chronic respiratory failure with hypoxia (HCC)   Chronic diastolic CHF (congestive heart failure) (HCC)   Seizure disorder (HCC)   Hypothyroidism   Bipolar mood disorder (HCC)   Diabetes mellitus type II, non insulin dependent (HCC)   Essential hypertension   Dyslipidemia   Tobacco dependence due to cigarettes    Hospital Course: 62yo with h/o seizure d/o, bipolar d/o, hypothyroidism, HFpEF, and COPD with chronic respiratory failure on 2L home O2 who presented on 11/7 with cough and SOB. She was placed on BIPAP on arrival due to respiratory distress. Negative CXR. Presentation c/w COPD exacerbation.   Assessment and Plan:  Acute on chronic respiratory failure associated with a COPD exacerbation Patient's shortness of breath and productive cough are most likely caused by acute COPD exacerbation.  She has history of O2-dependent COPD but was in distress on arrival and placed on BIPAP; she has been weaned to 2-3L [place] O2 at this time - documented O2 sats overnight with ambulation were upper 90s She does not have fever or leukocytosis  Chest x-ray is not consistent with pneumonia Admitted to med surg Nebulizers: scheduled Duoneb and prn albuterol Solu-Medrol 40 mg IV BID -> Prednisone 40 mg PO daily x 5 days She was started on Ceftriaxone -> doxycycline Continue Trelegy Coordinated care with [facility] team/PT/OT/Nutrition/RT consults CTA chest  negative for PE, showing changes of PAH Flutter valve, mucolytics also ordered  Dysphagia SLP evaluation ordered - family reports concern about possible swallow dysfunction/aspiration She is at mild to aspiration risk per SLP and needs dysphagia 1 diet with supervision during feeding She does not have ongoing PT/OT needs   Chronic diastolic CHF (congestive heart failure) (HCC) Clinically euvolemic with BNP 37, however CTA chest showing mild superimposed interstitial and alveolar pulmonary edema Continue carvedilol Can consider Lasix Last EF over 65% 2018 Daily weights with intake and output monitoring 11/8 echo with preserved EF, grade 1 DD, moderate AR Resume [place] Pepper, Kerendia at discharge   Bipolar mood disorder (HCC) Continue buspirone, lamotrigine, lithium, risperidone, Requip    Hypothyroidism Continue levothyroxine   Seizure disorder (HCC) Continue Lamictal and Keppra   HTN Continue carvedilol   DM A1c is 6.5, good control Resume Glucophage, Jardiance at discharge Cover with moderate-scale SSI  while hospitalized   HLD Continue pravastatin   Tobacco dependence Reports smoking 2 ppd Encourage cessation.   This was discussed with the patient and should be reviewed on an ongoing basis.   Patch ordered       Consultants: TOC team PT/OT Nutrition RT SLP   Procedures: None   Antibiotics: Doxycycline 10/8, 10/10-13 Ceftriaxone 11/8-9    30 Day Unplanned Readmission Risk Score    Flowsheet Row ED to Hosp-Admission (Current) from 08/14/2023 in [facility] 1C MEDICAL TELEMETRY  30 Day Unplanned Readmission Risk Score (%) 19.74 Filed at 08/16/2023 0400       This score is the patient's risk of an unplanned readmission  within 30 days of being discharged (0 -100%). The score is based on dignosis, age, lab data, medications, orders, and past utilization.   Low:  0-14.9   Medium: 15-21.9   High: 22-29.9   Extreme: 30 and above           Pain control - [place] Controlled Substance Reporting System database was reviewed. and patient was instructed, not to drive, operate heavy machinery, perform activities at heights, swimming or participation in water activities or provide baby-sitting services while on Pain, Sleep and Anxiety Medications; until their outpatient Physician has advised to do so again. Also recommended to not to take more than prescribed Pain, Sleep and Anxiety Medications.    Disposition: Group home Diet recommendation:  Dysphagia 1, pureed det, thin liquids DISCHARGE MEDICATION: Allergies as of 08/16/2023       Reactions   Penicillins Rash   Has patient had a PCN reaction causing immediate rash, facial/tongue/throat swelling, SOB or lightheadedness with hypotension: No Has patient had a PCN reaction causing severe rash involving mucus membranes or skin necrosis: No Has patient had a PCN reaction that required hospitalization: No Has patient had a PCN reaction occurring within the last 10 years: No If all of the above answers are "NO", then may proceed with Cephalosporin use.   Prednisone Rash        Medication List     TAKE these medications    albuterol 108 (90 Base) MCG/ACT inhaler Commonly known as: VENTOLIN HFA Inhale 1-2 puffs into the lungs every 6 (six) hours as needed for wheezing or shortness of breath.   busPIRone 30 MG tablet Commonly known as: BUSPAR Take 30 mg by mouth 2 (two) times daily.   carvedilol 3.125 MG tablet Commonly known as: COREG Take 3.125 mg by mouth 2 (two) times daily with a meal.   cetirizine 10 MG tablet Commonly known as: ZYRTEC Take 10 mg by mouth daily.   Cyanocobalamin 1000 MCG Subl Place 1 tablet (1,000 mcg total) under the tongue daily.   doxycycline 100 MG tablet Commonly known as: VIBRA-TABS Take 1 tablet (100 mg total) by mouth 2 (two) times daily for 4 days. Start taking on: August 17, 2023   empagliflozin 25 MG Tabs  tablet Commonly known as: JARDIANCE Take 25 mg by mouth daily.   ferrous sulfate 325 (65 FE) MG EC tablet Take 1 tablet (325 mg total) by mouth daily with breakfast.   Fish Oil 1000 MG Caps Take 1,000 mg by mouth 2 (two) times daily.   fluticasone 50 MCG/ACT nasal spray Commonly known as: FLONASE Place 1 spray into both nostrils daily.   Kerendia 20 MG Tabs Generic drug: Finerenone Take 1 tablet by mouth daily.   lamoTRIgine 150 MG tablet Commonly known as: LAMICTAL Take 150 mg by mouth 2 (two) times daily.   levETIRAcetam 250 MG tablet Commonly known as: KEPPRA Take 250 mg by mouth 2 (two) times daily.   levothyroxine 112 MCG tablet Commonly known as: SYNTHROID Take 112 mcg by mouth daily before breakfast.   lithium carbonate 150 MG capsule Take 150 mg by mouth daily.   metFORMIN 500 MG tablet Commonly known as: GLUCOPHAGE Take 500 mg by mouth 2 (two) times daily with a meal.   nicotine 21 mg/24hr patch Commonly known as: NICODERM CQ - dosed in mg/24 hours Place 1 patch (21 mg total) onto the skin daily. Start taking on: August 17, 2023   pravastatin 40 MG tablet Commonly known as: PRAVACHOL Take  40 mg by mouth daily.   predniSONE 20 MG tablet Commonly known as: DELTASONE Take 2 tablets (40 mg total) by mouth daily with breakfast for 4 days. Start taking on: August 17, 2023   risperiDONE 3 MG tablet Commonly known as: RISPERDAL Take 3 mg by mouth at bedtime.   rOPINIRole 1 MG tablet Commonly known as: REQUIP Take 1 mg by mouth at bedtime.   Trelegy Ellipta 100-62.5-25 MCG/INH Aepb Generic drug: Fluticasone-Umeclidin-Vilant Inhale 1 puff into the lungs daily.   Vitamin D (Ergocalciferol) 1.25 MG (50000 UNIT) Caps capsule Commonly known as: DRISDOL Take 50,000 Units by mouth every 7 (seven) days.        Discharge Exam:   Subjective: Patient reports having abdominal pain but no other details provided despite questioning.  She is a poor  historian and did not provide much history today.   Objective: Vitals:   08/16/23 0932 08/16/23 1100  BP:    Pulse: (!) 117   Resp:    Temp:    SpO2: (!) 80% 93%    Intake/Output Summary (Last 24 hours) at 08/16/2023 1141 Last data filed at 08/16/2023 0841 Gross per 24 hour  Intake 549.21 ml  Output --  Net 549.21 ml   Filed Weights   08/14/23 2036  Weight: 71.6 kg    Exam:  General:  Appears calm and comfortable and is in NAD, on 3L [place] O2, chronically ill Eyes:  EOMI, normal lids, iris ENT:  grossly normal hearing, lips & tongue, mmm; edentulous Neck:  no LAD, masses or thyromegaly Cardiovascular:  RRR, no m/r/g. No LE edema.  Respiratory:   CTA bilaterally with no wheezes/rales/rhonchi.  Normal respiratory effort. Abdomen:  soft, NT, ND Skin:  no rash or induration seen on limited exam Musculoskeletal:  grossly normal tone BUE/BLE, good ROM, no bony abnormality Psychiatric:  eccentric mood and affect, speech sparse today Neurologic:  CN 2-12 grossly intact, moves all extremities in coordinated fashion  Data Reviewed: I have reviewed the patient's lab results since admission.  Pertinent labs for today include:  Glucose 118 BUN 25 Normal CBC    Condition at discharge: stable  The results of significant diagnostics from this hospitalization (including imaging, microbiology, ancillary and laboratory) are listed below for reference.   Imaging Studies: ECHOCARDIOGRAM COMPLETE  Result Date: 08/15/2023    ECHOCARDIOGRAM REPORT   Patient Name:   NYILA SALTOS Date of Exam: 08/15/2023 Medical Rec #:  [id number]          Height:       62.0 in Accession #:    [id number]         Weight:       157.8 lb Date of Birth:  1961-03-07          BSA:          1.729 m Patient Age:    62 years           BP:           130/83 mmHg Patient Gender: F                  HR:           75 bpm. Exam Location:  [facility] Procedure: 2D Echo, Cardiac Doppler, Color Doppler and Strain Analysis  Indications:     CHF  History:         Patient has prior history of Echocardiogram examinations, most  recent 06/24/2017. CHF, COPD; Signs/Symptoms:Syncope. Anxiety,                  Asthma, Dementia, Seizures, Epilepsy, Bipolar.  Sonographer:     Mikki Harbor Referring Phys:  [id number] Andris Baumann Diagnosing Phys: Debbe Odea MD  Sonographer Comments: Global longitudinal strain was attempted. IMPRESSIONS  1. Left ventricular ejection fraction, by estimation, is 55 to 60%. The left ventricle has normal function. The left ventricle has no regional wall motion abnormalities. There is mild left ventricular hypertrophy. Left ventricular diastolic parameters are consistent with Grade I diastolic dysfunction (impaired relaxation).  2. Right ventricular systolic function is normal. The right ventricular size is normal. There is normal pulmonary artery systolic pressure.  3. The mitral valve is normal in structure. Trivial mitral valve regurgitation.  4. The aortic valve is calcified. Aortic valve regurgitation is mild to moderate. Aortic valve sclerosis/calcification is present, without any evidence of aortic stenosis. Aortic valve mean gradient measures 6.0 mmHg.  5. The inferior vena cava is normal in size with greater than 50% respiratory variability, suggesting right atrial pressure of 3 mmHg. FINDINGS  Left Ventricle: Left ventricular ejection fraction, by estimation, is 55 to 60%. The left ventricle has normal function. The left ventricle has no regional wall motion abnormalities. Global longitudinal strain performed but not reported based on interpreter judgement due to suboptimal tracking. The left ventricular internal cavity size was normal in size. There is mild left ventricular hypertrophy. Left ventricular diastolic parameters are consistent with Grade I diastolic dysfunction (impaired relaxation). Right Ventricle: The right ventricular size is normal. No increase in right ventricular  wall thickness. Right ventricular systolic function is normal. There is normal pulmonary artery systolic pressure. The tricuspid regurgitant velocity is 2.55 m/s, and  with an assumed right atrial pressure of 3 mmHg, the estimated right ventricular systolic pressure is 29.0 mmHg. Left Atrium: Left atrial size was normal in size. Right Atrium: Right atrial size was normal in size. Pericardium: There is no evidence of pericardial effusion. Mitral Valve: The mitral valve is normal in structure. Trivial mitral valve regurgitation. MV peak gradient, 4.3 mmHg. The mean mitral valve gradient is 2.0 mmHg. Tricuspid Valve: The tricuspid valve is normal in structure. Tricuspid valve regurgitation is trivial. Aortic Valve: The aortic valve is calcified. Aortic valve regurgitation is mild to moderate. Aortic valve sclerosis/calcification is present, without any evidence of aortic stenosis. Aortic valve mean gradient measures 6.0 mmHg. Aortic valve peak gradient measures 10.1 mmHg. Aortic valve area, by VTI measures 1.90 cm. Pulmonic Valve: The pulmonic valve was not well visualized. Pulmonic valve regurgitation is not visualized. Aorta: The aortic root and ascending aorta are structurally normal, with no evidence of dilitation. Venous: The inferior vena cava is normal in size with greater than 50% respiratory variability, suggesting right atrial pressure of 3 mmHg. IAS/Shunts: No atrial level shunt detected by color flow Doppler.  LEFT VENTRICLE PLAX 2D LVIDd:         4.70 cm   Diastology LVIDs:         3.20 cm   LV e' medial:    7.40 cm/s LV PW:         1.10 cm   LV E/e' medial:  8.5 LV IVS:        1.10 cm   LV e' lateral:   8.92 cm/s LVOT diam:     1.90 cm   LV E/e' lateral: 7.1 LV SV:         67  LV SV Index:   39 LVOT Area:     2.84 cm  RIGHT VENTRICLE RV Basal diam:  3.25 cm RV Mid diam:    2.80 cm RV S prime:     14.30 cm/s TAPSE (M-mode): 2.6 cm LEFT ATRIUM             Index        RIGHT ATRIUM           Index LA diam:         3.50 cm 2.02 cm/m   RA Area:     14.60 cm LA Vol (A2C):   76.0 ml 43.96 ml/m  RA Volume:   38.20 ml  22.10 ml/m LA Vol (A4C):   37.0 ml 21.40 ml/m LA Biplane Vol: 55.2 ml 31.93 ml/m  AORTIC VALVE                     PULMONIC VALVE AV Area (Vmax):    1.78 cm      PV Vmax:       1.19 m/s AV Area (Vmean):   1.80 cm      PV Peak grad:  5.7 mmHg AV Area (VTI):     1.90 cm AV Vmax:           159.00 cm/s AV Vmean:          111.000 cm/s AV VTI:            0.355 m AV Peak Grad:      10.1 mmHg AV Mean Grad:      6.0 mmHg LVOT Vmax:         99.70 cm/s LVOT Vmean:        70.500 cm/s LVOT VTI:          0.238 m LVOT/AV VTI ratio: 0.67  AORTA Ao Root diam: 3.50 cm Ao Asc diam:  2.90 cm MITRAL VALVE               TRICUSPID VALVE MV Area (PHT): 3.08 cm    TR Peak grad:   26.0 mmHg MV Area VTI:   2.36 cm    TR Vmax:        255.00 cm/s MV Peak grad:  4.3 mmHg MV Mean grad:  2.0 mmHg    SHUNTS MV Vmax:       1.04 m/s    Systemic VTI:  0.24 m MV Vmean:      61.7 cm/s   Systemic Diam: 1.90 cm MV Decel Time: 246 msec MV E velocity: 62.90 cm/s MV A velocity: 78.70 cm/s MV E/A ratio:  0.80 Debbe Odea MD Electronically signed by Debbe Odea MD Signature Date/Time: 08/15/2023/1:11:28 PM    Final    CT Angio Chest Pulmonary Embolism (PE) W or WO Contrast  Result Date: 08/15/2023 CLINICAL DATA:  Pulmonary embolism, asthma, COPD, respiratory distress EXAM: CT ANGIOGRAPHY CHEST WITH CONTRAST TECHNIQUE: Multidetector CT imaging of the chest was performed using the standard protocol during bolus administration of intravenous contrast. Multiplanar CT image reconstructions and MIPs were obtained to evaluate the vascular anatomy. RADIATION DOSE REDUCTION: This exam was performed according to the departmental dose-optimization program which includes automated exposure control, adjustment of the mA and/or kV according to patient size and/or use of iterative reconstruction technique. CONTRAST:  75mL OMNIPAQUE IOHEXOL 350  MG/ML SOLN COMPARISON:  11/10/2018 FINDINGS: Cardiovascular: There is adequate opacification of the pulmonary arterial tree. There is no intraluminal filling defect identified to suggest acute pulmonary  embolism. The central pulmonary arteries are markedly enlarged in keeping with changes of pulmonary arterial hypertension. There is extensive multi-vessel coronary artery calcification. Global cardiac size is within normal limits. No pericardial effusion. Extensive atherosclerotic calcification within the thoracic aorta. No aortic aneurysm. 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 Serial 50% stenosis is noted of the vessel just proximal to the takeoff of the left vertebral artery. There is a suspected hemodynamically significant stenosis left common carotid artery at its origin though this is not well characterized on this examination. Mediastinum/Nodes: There is borderline pathologic mediastinal adenopathy with the index lymph node within the right paratracheal lymph node group measuring up to 17 mm in short axis diameter. This is nonspecific and may be reactive in nature though a low-grade lymphoproliferative process could appear similarly. Visualized thyroid is unremarkable. Esophagus is unremarkable. Lungs/Pleura: Moderate emphysema. Superimposed asymmetric ground-glass pulmonary infiltrate and smooth interlobular septal thickening is present in keeping with mild interstitial and alveolar pulmonary edema. 4 mm subpleural pulmonary nodule within the right upper lobe, axial image # 51/5, indeterminate, minimally enlarged since prior examination. No pneumothorax or pleural effusion. No central obstructing lesion. 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 Cholecystectomy has been performed. No acute abnormality. Musculoskeletal: No acute bone abnormality. No lytic or  blastic bone lesion. Review of the MIP images confirms the above findings. IMPRESSION: 1. No pulmonary embolism. 2. Extensive multi-vessel coronary artery calcification. 3. Marked enlargement of the central pulmonary arteries in keeping with changes of pulmonary arterial hypertension. 4. Moderate emphysema. 5. Mild superimposed interstitial and alveolar pulmonary edema. 6. 4 mm subpleural pulmonary nodule within the right upper lobe, indeterminate, minimally enlarged since prior examination. This is likely benign given its minimal change since prior examination further follow-up is not required. 7. Borderline pathologic mediastinal adenopathy, nonspecific and may be reactive in nature though a low-grade lymphoproliferative process could appear similarly. Follow-up evaluation in 3-6 months following resolution of patient's acute issues be helpful in documenting resolution. Aortic Atherosclerosis (ICD10-I70.0) and Emphysema (ICD10-J43.9). Electronically Signed   By: Helyn Numbers M.D.   On: 08/15/2023 01:41   DG Chest Portable 1 View  Result Date: 08/14/2023 CLINICAL DATA:  Respiratory distress EXAM: PORTABLE CHEST 1 VIEW COMPARISON:  01/23/2023 FINDINGS: Single frontal view of the chest demonstrates an unremarkable cardiac silhouette. No acute airspace disease, effusion, or pneumothorax. Chronic background scarring consistent with emphysema. No acute bony abnormalities. IMPRESSION: 1. Emphysema.  No acute airspace disease. Electronically Signed   By: Sharlet Salina M.D.   On: 08/14/2023 21:47    Microbiology: Results for orders placed or performed during the hospital encounter of 08/14/23  Resp panel by RT-PCR (RSV, Flu A&B, Covid) Anterior Nasal Swab     Status: None   Collection Time: 08/14/23  8:36 PM   Specimen: Anterior Nasal Swab  Result Value Ref Range Status   SARS Coronavirus 2 by RT PCR NEGATIVE NEGATIVE Final    Comment: (NOTE) SARS-CoV-2 target nucleic acids are NOT DETECTED.  The  SARS-CoV-2 RNA is generally detectable in upper respiratory specimens during the acute phase of infection. The lowest concentration of SARS-CoV-2 viral copies this assay can detect is 138 copies/mL. A negative result does not preclude SARS-Cov-2 infection and should not be used as the sole basis for treatment or other patient management decisions. A negative result may occur with  improper specimen collection/handling, submission of specimen  other than nasopharyngeal swab, presence of viral mutation(s) within the areas targeted by this assay, and inadequate number of viral copies(<138 copies/mL). A negative result must be combined with clinical observations, patient history, and epidemiological information. The expected result is Negative.  Fact Sheet for Patients:  BloggerCourse.com  Fact Sheet for Healthcare Providers:  SeriousBroker.it  This test is no t yet approved or cleared by the [place] FDA and  has been authorized for detection and/or diagnosis of SARS-CoV-2 by FDA under an Emergency Use Authorization (EUA). This EUA will remain  in effect (meaning this test can be used) for the duration of the COVID-19 declaration under Section 564(b)(1) of the Act, 21 U.S.C.section 360bbb-3(b)(1), unless the authorization is terminated  or revoked sooner.       Influenza A by PCR NEGATIVE NEGATIVE Final   Influenza B by PCR NEGATIVE NEGATIVE Final    Comment: (NOTE) The Xpert Xpress SARS-CoV-2/FLU/RSV plus assay is intended as an aid in the diagnosis of influenza from Nasopharyngeal swab specimens and should not be used as a sole basis for treatment. Nasal washings and aspirates are unacceptable for Xpert Xpress SARS-CoV-2/FLU/RSV testing.  Fact Sheet for Patients: BloggerCourse.com  Fact Sheet for Healthcare Providers: SeriousBroker.it  This test is not yet approved or  cleared by the [place] FDA and has been authorized for detection and/or diagnosis of SARS-CoV-2 by FDA under an Emergency Use Authorization (EUA). This EUA will remain in effect (meaning this test can be used) for the duration of the COVID-19 declaration under Section 564(b)(1) of the Act, 21 U.S.C. section 360bbb-3(b)(1), unless the authorization is terminated or revoked.     Resp Syncytial Virus by PCR NEGATIVE NEGATIVE Final    Comment: (NOTE) Fact Sheet for Patients: BloggerCourse.com  Fact Sheet for Healthcare Providers: SeriousBroker.it  This test is not yet approved or cleared by the [place] FDA and has been authorized for detection and/or diagnosis of SARS-CoV-2 by FDA under an Emergency Use Authorization (EUA). This EUA will remain in effect (meaning this test can be used) for the duration of the COVID-19 declaration under Section 564(b)(1) of the Act, 21 U.S.C. section 360bbb-3(b)(1), unless the authorization is terminated or revoked.  Performed at [facility], [address] [place], [place] [address]     Labs: CBC: Recent Labs  Lab 08/14/23 2041 08/15/23 0857 08/16/23 0426  WBC 8.3 6.4 7.3  NEUTROABS  --  5.6 5.5  HGB 12.5 12.9 12.6  HCT 39.2 39.8 38.8  MCV 96.6 93.9 94.4  PLT 280 295 294   Basic Metabolic Panel: Recent Labs  Lab 08/14/23 2041 08/15/23 0857 08/16/23 0426  NA 135 137 140  K 3.9 4.6 4.3  CL 103 106 107  CO2 22 17* 25  GLUCOSE 217* 220* 118*  BUN 22 18 25*  CREATININE 1.15* 0.93 0.93  CALCIUM 9.4 8.8* 9.2   Liver Function Tests: Recent Labs  Lab 08/14/23 2041  AST 18  ALT 15  ALKPHOS 94  BILITOT 0.7  PROT 6.3*  ALBUMIN 3.1*   CBG: Recent Labs  Lab 08/15/23 1713 08/15/23 2038 08/16/23 0745 08/16/23 1132  GLUCAP 266* 243* 100* 154*    Discharge time spent: greater than 30 minutes.  Signed: Jonah Blue, MD [facility] 08/16/2023

## 2023-08-16 NOTE — Hospital Course (Signed)
62yo with h/o seizure d/o, bipolar d/o, hypothyroidism, HFpEF, and COPD with chronic respiratory failure on 2L home O2 who presented on 11/7 with cough and SOB. She was placed on BIPAP on arrival due to respiratory distress. Negative CXR. Presentation c/w COPD exacerbation.

## 2023-08-16 NOTE — Progress Notes (Signed)
Patient ambulated to bathroom.v/s done after 02 sat 99%. O2 decrease to 3L/Min. O2 sat maintained ta 98%. Patient had no respiratory distress during the shift.

## 2023-08-16 NOTE — Evaluation (Signed)
Occupational Therapy Evaluation Patient Details Name: OMNI STROHMAIER MRN: [id number] DOB: 08/28/1961 Today's Date: 08/16/2023   History of Present Illness AKASIA BILLE is a 62 y.o. female with medical history significant for Seizure disorder, bipolar mood disorder, hypothyroidism, diastolic CHF (EF> 65% 06/2017), COPD on home O2 at 2 L, being admitted for respiratory failure secondary to COPD exacerbation. Upon admission she was placed on Bi-pap, she has since been weaned back to 3L O2;   Clinical Impression   Ms. Hammock was seen for OT evaluation this date. Prior to hospital admission, pt required 24/7 assistance for ADL, IADL, and mobility. Pt lives in a group home with staff available to assist 24/7. Per chart, pt requires round the clock supervision from staff due to baseline cognitive impairments. Pt presents to acute OT demonstrating impaired ADL performance and functional mobility 2/2 decreased cardiopulmonary status, decreased safety awareness, and decreased activity tolerance (See OT problem list for additional functional deficits). Pt currently requires MIN-MOD A For LB ADL management from STS and functional mobility with a RW.  Pt would benefit from skilled OT services to address noted impairments and functional limitations (see below for any additional details) in order to maximize safety and independence while minimizing falls risk and caregiver burden. Do not anticipate the need for follow up OT services upon acute hospital DC.        If plan is discharge home, recommend the following: A little help with walking and/or transfers;A little help with bathing/dressing/bathroom;Assistance with cooking/housework;Assist for transportation;Help with stairs or ramp for entrance;Direct supervision/assist for financial management;Direct supervision/assist for medications management    Functional Status Assessment  Patient has had a recent decline in their functional status and  demonstrates the ability to make significant improvements in function in a reasonable and predictable amount of time.  Equipment Recommendations  None recommended by OT    Recommendations for Other Services       Precautions / Restrictions Precautions Precautions: Fall Restrictions Weight Bearing Restrictions: Yes      Mobility Bed Mobility Overal bed mobility: Needs Assistance Bed Mobility: Supine to Sit     Supine to sit: Supervision     General bed mobility comments: pt impulsive and quick to sit up on side of bed or lay down in bed; supervision for safety awareness    Transfers Overall transfer level: Needs assistance Equipment used: Rolling walker (2 wheels) Transfers: Sit to/from Stand Sit to Stand: Min assist           General transfer comment: requires min A for safety given increased impulsivity and lack of safety awareness, tends to veer to L with RW and requires physical assistance to avoid obstacles when mobilizing.      Balance Overall balance assessment: Needs assistance Sitting-balance support: Feet supported Sitting balance-Leahy Scale: Fair Sitting balance - Comments: demonstrates frequent ataxic movement and impulsivity often repositioning when sitting   Standing balance support: Bilateral upper extremity supported Standing balance-Leahy Scale: Fair Standing balance comment: uses RW, requires min-mod A for balance control and physical assistance to manage RW position, often veering side/side; very impulsive                           ADL either performed or assessed with clinical judgement   ADL Overall ADL's : Needs assistance/impaired  General ADL Comments: Pt requires increased cueing to implement energy conservation strategies t/o ADL management. She presents near baseline level for physical assistance (Supervision for bed mobility, MIN A for LB dressing and functional mobility  with RW). Pt would benefit from further education to maximize carryover of education on ECS.     Vision Baseline Vision/History: 1 Wears glasses Ability to See in Adequate Light: 1 Impaired Patient Visual Report: No change from baseline       Perception         Praxis         Pertinent Vitals/Pain Pain Assessment Pain Assessment: No/denies pain     Extremity/Trunk Assessment Upper Extremity Assessment Upper Extremity Assessment: Generalized weakness (Noted LUE tremor, otherwise no focal weakness. Grossly 4/5 t/o. Pt reports baseline tremors.)   Lower Extremity Assessment Lower Extremity Assessment: Defer to PT evaluation;Generalized weakness   Cervical / Trunk Assessment Cervical / Trunk Assessment: Kyphotic   Communication Communication Communication: No apparent difficulties Cueing Techniques: Verbal cues;Gestural cues;Tactile cues   Cognition Arousal: Alert Behavior During Therapy: WFL for tasks assessed/performed Overall Cognitive Status: Difficult to assess                                 General Comments: oriented to self; has history of cognitive impairment     General Comments  Vitals monitored during session, pt noted to desat to 80% on 3L [place], HR 117, during functional mobility. She rebounds to >/= 88% with therapeutic rest break and cues for PLB. Pt notably SOB, benefits from cueing to slow breathing. Pt left on 3L [place] spO2 >/=92% HR 90's at end of session.    Exercises Other Exercises Other Exercises: Pt educated on role of OT in acute setting, safety, falls prevention strategies, and energy conservation strategies during ADL management.   Shoulder Instructions      Home Living Family/patient expects to be discharged to:: Group home Living Arrangements: Group Home Available Help at Discharge: Available 24 hours/day Type of Home: House Home Access: Ramped entrance     Home Layout: Two level;Able to live on main level with  bedroom/bathroom Alternate Level Stairs-Number of Steps: Per chart review, residents are not allowed on the 2nd floor and all live on main floor.   Bathroom Shower/Tub: Producer, television/film/video: Standard     Home Equipment: Agricultural consultant (2 wheels);Grab bars - toilet;Grab bars - tub/shower   Additional Comments: Per chart, residents have 24/7 in-home care.      Prior Functioning/Environment Prior Level of Function : Needs assist  Cognitive Assist : Mobility (cognitive);ADLs (cognitive)     Physical Assist : Mobility (physical);ADLs (physical) Mobility (physical): Gait ADLs (physical): Feeding;Bathing;Dressing;IADLs;Grooming Mobility Comments: uses RW, would need assistance for safety as she will veer and bump into things ADLs Comments: per Maurine Minister Clinical research associate at group home) she gets assistance with bathing/dressing, meals are all provided;        OT Problem List: Decreased strength;Decreased cognition;Decreased range of motion;Decreased safety awareness;Decreased activity tolerance;Decreased knowledge of use of DME or AE;Impaired balance (sitting and/or standing);Cardiopulmonary status limiting activity      OT Treatment/Interventions: Self-care/ADL training;Therapeutic exercise;DME and/or AE instruction;Balance training;Energy conservation;Patient/family education;Cognitive remediation/compensation    OT Goals(Current goals can be found in the care plan section) Acute Rehab OT Goals Patient Stated Goal: None stated OT Goal Formulation: Patient unable to participate in goal setting Time For Goal Achievement: 08/30/23 ADL Goals Pt Will  Perform Eating: with supervision;with set-up;with caregiver independent in assisting;sitting Pt Will Perform Grooming: sitting;with supervision;with set-up;with caregiver independent in assisting Pt Will Transfer to Toilet: bedside commode;with min assist;ambulating Pt Will Perform Toileting - Clothing Manipulation and hygiene: with  min assist;sit to/from stand;with caregiver independent in assisting  OT Frequency: Min 1X/week    Co-evaluation PT/OT/SLP Co-Evaluation/Treatment: Yes Reason for Co-Treatment: Complexity of the patient's impairments (multi-system involvement);Necessary to address cognition/behavior during functional activity;For patient/therapist safety;To address functional/ADL transfers PT goals addressed during session: Mobility/safety with mobility;Balance;Proper use of DME OT goals addressed during session: ADL's and self-care;Proper use of Adaptive equipment and DME      AM-PAC OT "6 Clicks" Daily Activity     Outcome Measure Help from another person eating meals?: A Little Help from another person taking care of personal grooming?: A Little Help from another person toileting, which includes using toliet, bedpan, or urinal?: A Little Help from another person bathing (including washing, rinsing, drying)?: A Little Help from another person to put on and taking off regular upper body clothing?: A Little Help from another person to put on and taking off regular lower body clothing?: A Little 6 Click Score: 18   End of Session Equipment Utilized During Treatment: Gait belt;Rolling walker (2 wheels);Oxygen Nurse Communication: Mobility status  Activity Tolerance: Patient tolerated treatment well Patient left: in chair;with call bell/phone within reach;with chair alarm set  OT Visit Diagnosis: Other abnormalities of gait and mobility (R26.89);Muscle weakness (generalized) (M62.81);Other symptoms and signs involving cognitive function                Time: 1610-9604 OT Time Calculation (min): 27 min Charges:  OT General Charges $OT Visit: 1 Visit OT Evaluation $OT Eval Moderate Complexity: 1 Mod  Ahleah Simko Smith Robert, M.S., OTR/L 08/16/23, 11:17 AM

## 2023-08-16 NOTE — Plan of Care (Signed)
  Problem: Education: Goal: Knowledge of disease or condition will improve Outcome: Progressing   Problem: Activity: Goal: Ability to tolerate increased activity will improve Outcome: Progressing   Problem: Respiratory: Goal: Ability to maintain a clear airway will improve Outcome: Progressing   Problem: Education: Goal: Ability to describe self-care measures that may prevent or decrease complications (Diabetes Survival Skills Education) will improve Outcome: Progressing   Problem: Health Behavior/Discharge Planning: Goal: Ability to identify and utilize available resources and services will improve Outcome: Progressing   Problem: Metabolic: Goal: Ability to maintain appropriate glucose levels will improve Outcome: Progressing   Problem: Skin Integrity: Goal: Risk for impaired skin integrity will decrease Outcome: Progressing   Problem: Tissue Perfusion: Goal: Adequacy of tissue perfusion will improve Outcome: Progressing   Problem: Activity: Goal: Risk for activity intolerance will decrease Outcome: Progressing   Problem: Nutrition: Goal: Adequate nutrition will be maintained Outcome: Progressing   Problem: Coping: Goal: Level of anxiety will decrease Outcome: Progressing

## 2023-09-06 ENCOUNTER — Emergency Department
Admission: EM | Admit: 2023-09-06 | Discharge: 2023-09-06 | Disposition: A | Discharge status: Home / Self Care | Payer: Medicare Other | Attending: Emergency Medicine | Admitting: Emergency Medicine

## 2023-09-06 ENCOUNTER — Emergency Department: Payer: Medicare Other

## 2023-09-06 ENCOUNTER — Other Ambulatory Visit: Payer: Self-pay

## 2023-09-06 ENCOUNTER — Encounter: Payer: Self-pay | Admitting: Radiology

## 2023-09-06 DIAGNOSIS — J449 Chronic obstructive pulmonary disease, unspecified: Secondary | ICD-10-CM | POA: Insufficient documentation

## 2023-09-06 DIAGNOSIS — R1010 Upper abdominal pain, unspecified: Secondary | ICD-10-CM | POA: Diagnosis not present

## 2023-09-06 DIAGNOSIS — I509 Heart failure, unspecified: Secondary | ICD-10-CM | POA: Insufficient documentation

## 2023-09-06 DIAGNOSIS — R112 Nausea with vomiting, unspecified: Secondary | ICD-10-CM | POA: Diagnosis present

## 2023-09-06 DIAGNOSIS — F0390 Unspecified dementia without behavioral disturbance: Secondary | ICD-10-CM | POA: Diagnosis not present

## 2023-09-06 LAB — COMPREHENSIVE METABOLIC PANEL
ALT: 18 U/L (ref 0–44)
AST: 19 U/L (ref 15–41)
Albumin: 3 g/dL — ABNORMAL LOW (ref 3.5–5.0)
Alkaline Phosphatase: 81 U/L (ref 38–126)
Anion gap: 9 (ref 5–15)
BUN: 19 mg/dL (ref 8–23)
CO2: 25 mmol/L (ref 22–32)
Calcium: 9.5 mg/dL (ref 8.9–10.3)
Chloride: 104 mmol/L (ref 98–111)
Creatinine, Ser: 1.08 mg/dL — ABNORMAL HIGH (ref 0.44–1.00)
GFR, Estimated: 58 mL/min — ABNORMAL LOW (ref >60)
Glucose, Bld: 144 mg/dL — ABNORMAL HIGH (ref 70–99)
Potassium: 4.4 mmol/L (ref 3.5–5.1)
Sodium: 138 mmol/L (ref 135–145)
Total Bilirubin: 0.8 mg/dL (ref <1.2)
Total Protein: 5.9 g/dL — ABNORMAL LOW (ref 6.5–8.1)

## 2023-09-06 LAB — TROPONIN I (HIGH SENSITIVITY)
Troponin I (High Sensitivity): 7 ng/L (ref <18)
Troponin I (High Sensitivity): 7 ng/L (ref <18)

## 2023-09-06 LAB — CBC WITH DIFFERENTIAL/PLATELET
Abs Immature Granulocytes: 0.03 10*3/uL (ref 0.00–0.07)
Basophils Absolute: 0 10*3/uL (ref 0.0–0.1)
Basophils Relative: 1 %
Eosinophils Absolute: 0.1 10*3/uL (ref 0.0–0.5)
Eosinophils Relative: 2 %
HCT: 40.8 % (ref 36.0–46.0)
Hemoglobin: 13.1 g/dL (ref 12.0–15.0)
Immature Granulocytes: 1 %
Lymphocytes Relative: 21 %
Lymphs Abs: 1.3 10*3/uL (ref 0.7–4.0)
MCH: 30 pg (ref 26.0–34.0)
MCHC: 32.1 g/dL (ref 30.0–36.0)
MCV: 93.4 fL (ref 80.0–100.0)
Monocytes Absolute: 0.3 10*3/uL (ref 0.1–1.0)
Monocytes Relative: 5 %
Neutro Abs: 4.5 10*3/uL (ref 1.7–7.7)
Neutrophils Relative %: 70 %
Platelets: 183 10*3/uL (ref 150–400)
RBC: 4.37 MIL/uL (ref 3.87–5.11)
RDW: 12.9 % (ref 11.5–15.5)
WBC: 6.3 10*3/uL (ref 4.0–10.5)
nRBC: 0 % (ref 0.0–0.2)

## 2023-09-06 LAB — LIPASE, BLOOD: Lipase: 20 U/L (ref 11–51)

## 2023-09-06 MED ORDER — IOHEXOL 300 MG/ML  SOLN
100.0000 mL | Freq: Once | INTRAMUSCULAR | Status: AC | PRN
  Administered 2023-09-06: 100 mL via INTRAVENOUS

## 2023-09-06 MED ORDER — SODIUM CHLORIDE 0.9 % IV BOLUS
1000.0000 mL | Freq: Once | INTRAVENOUS | Status: AC
  Administered 2023-09-06: 1000 mL via INTRAVENOUS

## 2023-09-06 MED ORDER — ONDANSETRON HCL 4 MG/2ML IJ SOLN
4.0000 mg | Freq: Once | INTRAMUSCULAR | Status: AC
Start: 2023-09-06 — End: 2023-09-06
  Administered 2023-09-06: 4 mg via INTRAVENOUS
  Filled 2023-09-06: qty 2

## 2023-09-06 MED ORDER — ONDANSETRON HCL 4 MG PO TABS: 4.0000 mg | ORAL_TABLET | Freq: Three times a day (TID) | ORAL | 0 refills | Status: AC | PRN

## 2023-09-06 NOTE — ED Triage Notes (Signed)
BIBEMS, coming from a group home. c/o unsteady gait and possible syncope per staff after eating breakfast. Hx of aspiration a couple of weeks ago. Pt is drowsy with EMS, but arouasable. Vomiting with EMS   95/56 22G RFA. BGL: 169. Pt is on lamictal. Baseline 2L [place]- 97% PMH: Seizures. COPD, CHF, bipolar, DM

## 2023-09-06 NOTE — ED Provider Notes (Addendum)
[facility] Provider Note    Event Date/Time   First MD Initiated Contact with Patient 09/06/23 1055     (approximate)   History   Loss of Consciousness and Emesis   HPI  Tammy [name] is a 62 y.o. female   Past medical history of COPD on chronic 2 L O2, bipolar, epilepsy, dementia and anxiety, CHF who presents to the emergency department with vomiting.  She was in her regular state of health coming from a group home today when she started vomiting after eating brunch.  Immediately after eating she started to feel abdominal pain, nausea and vomited her food back up.  She denies chest pain, shortness of breath, cough, fever urinary symptoms and her bowel movements have been normal.  In fact he had a normal bowel movement this morning.  She continues to feel nauseated and has upper abdominal discomfort.  She denies any known sick contacts, recent travel.   External Medical Documents Reviewed: Discharge summary from 08/16/2023 with COPD exacerbation, negative CT angiogram for PE noted to have dysphagia and possible swallow dysfunction/aspiration      Physical Exam   Triage Vital Signs: ED Triage Vitals  Encounter Vitals Group     BP 09/06/23 1053 (!) 102/52     Systolic BP Percentile --      Diastolic BP Percentile --      Pulse Rate 09/06/23 1053 (!) 53     Resp 09/06/23 1053 18     Temp 09/06/23 1053 97.8 F (36.6 C)     Temp Source 09/06/23 1053 Oral     SpO2 09/06/23 1052 99 %     Weight --      Height --      Head Circumference --      Peak Flow --      Pain Score --      Pain Loc --      Pain Education --      Exclude from Growth Chart --     Most recent vital signs: Vitals:   09/06/23 1430 09/06/23 1500  BP: 113/62 113/62  Pulse: 67 69  Resp:    Temp:    SpO2: 99% 98%    General: Awake, no distress.  CV:  Good peripheral perfusion.  Resp:  Normal effort.  Abd:  No distention.  Other:  Chronically ill-appearing  patient laying in the stretcher with vomit all over her chest.  She is answering questions appropriately, pleasant and oriented.  She has upper abdominal pain when I palpate but no rigidity or guarding.  Her abdomen is nondistended.  Her blood pressure is 100/50 and she is 98% on her home 2 L, no fever.  She is moving all extremities, alert and oriented, appropriate, no facial asymmetry.   ED Results / Procedures / Treatments   Labs (all labs ordered are listed, but only abnormal results are displayed) Labs Reviewed  COMPREHENSIVE METABOLIC PANEL - Abnormal; Notable for the following components:      Result Value   Glucose, Bld 144 (*)    Creatinine, Ser 1.08 (*)    Total Protein 5.9 (*)    Albumin 3.0 (*)    GFR, Estimated 58 (*)    All other components within normal limits  LIPASE, BLOOD  CBC WITH DIFFERENTIAL/PLATELET  TROPONIN I (HIGH SENSITIVITY)  TROPONIN I (HIGH SENSITIVITY)     I ordered and reviewed the above labs they are notable for white blood cell count and  H&H are within normal limits, baseline compared to prior testing  EKG  ED ECG REPORT I, Pilar Jarvis, the attending physician, personally viewed and interpreted this ECG.   Date: 09/06/2023  EKG Time: 1128  Rate: 54  Rhythm: sinus bradycardia  Axis: nl  Intervals:none  ST&T Change: no stemi    RADIOLOGY I independently reviewed and interpreted chest x-ray and see no obvious focal consolidation I also reviewed radiologist's formal read.   PROCEDURES:  Critical Care performed: No  Procedures   MEDICATIONS ORDERED IN ED: Medications  sodium chloride 0.9 % bolus 1,000 mL (0 mLs Intravenous Stopped 09/06/23 1442)  ondansetron (ZOFRAN) injection 4 mg (4 mg Intravenous Given 09/06/23 1129)  iohexol (OMNIPAQUE) 300 MG/ML solution 100 mL (100 mLs Intravenous Contrast Given 09/06/23 1232)     IMPRESSION / MDM / ASSESSMENT AND PLAN / ED COURSE  I reviewed the triage vital signs and the nursing notes.                                 Patient's presentation is most consistent with acute presentation with potential threat to life or bodily function.  Differential diagnosis includes, but is not limited to, viral gastroenteritis, biliary pathologies like cholecystitis choledocholithiasis, pancreatitis, aspiration, considered but less likely obstruction, ACS   The patient is on the cardiac monitor to evaluate for evidence of arrhythmia and/or significant heart rate changes.  MDM:    Nauseated with vomiting acute onset earlier today, triage reports of syncope with the patient adamantly denies loss of consciousness.  Syncope or seizure are on the differential diagnosis but she reports no symptoms currently, no headache, no noted trauma to the head, no signs of ongoing seizure activity or no focal neurologic deficits.  Defer advanced head imaging at this time especially since her chief complaint is solely upper abdominal discomfort and nausea.   She is tender to the upper abdomen, will check a CT scan for complicated pancreatitis, obstruction, biliary abnormalities which we can further workup with right upper quadrant ultrasound if identified on CT scan.  Will give antiemetics, IV crystalloid infusion, monitor.  No urinary complaints we will defer urinalysis.  Considered but less likely cardiopulmonary emergencies given no shortness of breath, no chest pain and a normal-appearing EKG.  Will check a chest x-ray given her high aspiration risk especially in the setting of vomiting today.   -- Workup unremarkable patient without any further episodes of emesis after antiemetic.  Discharged with a prescription for Zofran, PMD follow-up and return precautions given.   -- Spoke with Para March from her group home who expresses concerns about taking the patient back to the group home given her event this morning where she seemed to be altered and vomited.  I explained that our workup in the emergency department  has been negative and she has been awake appropriate with no apparent neurologic deficits and has no longer vomiting after receiving antiemetic.  I reinforced return precautions and that if she were to develop any new worsening or unexpected symptoms to come back to the emergency department for reevaluation.  I also enforced the importance of follow-up appointment with primary doctor and neurology.      FINAL CLINICAL IMPRESSION(S) / ED DIAGNOSES   Final diagnoses:  Nausea and vomiting, unspecified vomiting type  Upper abdominal pain     Rx / DC Orders   ED Discharge Orders  Ordered    ondansetron (ZOFRAN) 4 MG tablet  Every 8 hours PRN        09/06/23 1405             Note:  This document was prepared using Dragon voice recognition software and may include unintentional dictation errors.    Pilar Jarvis, MD 09/06/23 1425    Pilar Jarvis, MD 09/06/23 1425    Pilar Jarvis, MD 09/06/23 1545    Pilar Jarvis, MD 09/06/23 [phone]

## 2023-09-06 NOTE — ED Notes (Addendum)
This Rn spoke with Barbaraann Share, caregiver at the group home. She is refusing to take the patient back. She stated "She has not been there long enough for you guys to work her up. 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 She was unconscious this morning". This RN advised her she has received a full work up and is now alert and oriented at baseline. There is not a medical reason to hold her for observation. She is demanding to speak with the doctor herself. This RN informed her he is a very busy doctor and I would see what I could do, but if she chooses not to come get the patient she can be charged with abandonment and she stated "you do what you gotta do but I feel unsafe with her coming home bc of her health".

## 2023-09-06 NOTE — ED Notes (Signed)
This RN to bedside to introduce self to pt. Pt is alert and oriented and asking to go home. Pt has no complaints at this time. Off coming RN advised she called the facility and they are refusing to come and get her.

## 2023-09-06 NOTE — ED Notes (Signed)
This RN called to see what the delay was in picking up this patient. Staff stated "I thought she was staying for a few more hours for observation." This RN informed her that was not the case that MD wong had worked her up and cleared her for DC. So they are now going to come get her.

## 2023-09-06 NOTE — Discharge Instructions (Signed)
Use zofran for nausea as needed. Drink plenty of fluids to stay well-hydrated.  Find Pedialyte or similar electrolyte rehydration formulas at your local pharmacy.   Thank you for choosing [place] for your health care today!  Please see your primary doctor this week for a follow up appointment.   If you have any new, worsening, or unexpected symptoms call your doctor right away or come back to the emergency department for reevaluation.  It was my pleasure to care for you today.   Daneil Dan Modesto Charon, MD

## 2023-09-07 ENCOUNTER — Other Ambulatory Visit: Payer: Self-pay

## 2023-09-07 ENCOUNTER — Emergency Department
Admission: EM | Admit: 2023-09-07 | Discharge: 2023-09-07 | Disposition: A | Discharge status: Home / Self Care | Payer: Medicare Other | Attending: Emergency Medicine | Admitting: Emergency Medicine

## 2023-09-07 DIAGNOSIS — R531 Weakness: Secondary | ICD-10-CM | POA: Diagnosis present

## 2023-09-07 DIAGNOSIS — R5381 Other malaise: Secondary | ICD-10-CM | POA: Diagnosis not present

## 2023-09-07 DIAGNOSIS — J449 Chronic obstructive pulmonary disease, unspecified: Secondary | ICD-10-CM | POA: Insufficient documentation

## 2023-09-07 DIAGNOSIS — I509 Heart failure, unspecified: Secondary | ICD-10-CM | POA: Diagnosis not present

## 2023-09-07 DIAGNOSIS — I110 Hypertensive heart disease with heart failure: Secondary | ICD-10-CM | POA: Diagnosis not present

## 2023-09-07 DIAGNOSIS — F0390 Unspecified dementia without behavioral disturbance: Secondary | ICD-10-CM | POA: Insufficient documentation

## 2023-09-07 DIAGNOSIS — E119 Type 2 diabetes mellitus without complications: Secondary | ICD-10-CM | POA: Insufficient documentation

## 2023-09-07 LAB — COMPREHENSIVE METABOLIC PANEL
ALT: 17 U/L (ref 0–44)
AST: 18 U/L (ref 15–41)
Albumin: 3.6 g/dL (ref 3.5–5.0)
Alkaline Phosphatase: 91 U/L (ref 38–126)
Anion gap: 9 (ref 5–15)
BUN: 20 mg/dL (ref 8–23)
CO2: 25 mmol/L (ref 22–32)
Calcium: 9.5 mg/dL (ref 8.9–10.3)
Chloride: 104 mmol/L (ref 98–111)
Creatinine, Ser: 1.05 mg/dL — ABNORMAL HIGH (ref 0.44–1.00)
GFR, Estimated: >60 mL/min (ref >60)
Glucose, Bld: 133 mg/dL — ABNORMAL HIGH (ref 70–99)
Potassium: 4.6 mmol/L (ref 3.5–5.1)
Sodium: 138 mmol/L (ref 135–145)
Total Bilirubin: 0.7 mg/dL (ref <1.2)
Total Protein: 6.8 g/dL (ref 6.5–8.1)

## 2023-09-07 LAB — CBC
HCT: 46.3 % — ABNORMAL HIGH (ref 36.0–46.0)
Hemoglobin: 14.6 g/dL (ref 12.0–15.0)
MCH: 30.3 pg (ref 26.0–34.0)
MCHC: 31.5 g/dL (ref 30.0–36.0)
MCV: 96.1 fL (ref 80.0–100.0)
Platelets: 193 10*3/uL (ref 150–400)
RBC: 4.82 MIL/uL (ref 3.87–5.11)
RDW: 13 % (ref 11.5–15.5)
WBC: 5.8 10*3/uL (ref 4.0–10.5)
nRBC: 0 % (ref 0.0–0.2)

## 2023-09-07 LAB — LIPASE, BLOOD: Lipase: 21 U/L (ref 11–51)

## 2023-09-07 MED ORDER — ONDANSETRON 4 MG PO TBDP
4.0000 mg | ORAL_TABLET | Freq: Once | ORAL | Status: AC
Start: 2023-09-07 — End: 2023-09-07
  Administered 2023-09-07: 4 mg via ORAL
  Filled 2023-09-07: qty 1

## 2023-09-07 MED ORDER — ALUMINUM-MAGNESIUM-SIMETHICONE 200-200-20 MG/5ML PO SUSP: 30.0000 mL | Freq: Three times a day (TID) | ORAL | 0 refills | Status: DC

## 2023-09-07 MED ORDER — ALUM & MAG HYDROXIDE-SIMETH 200-200-20 MG/5ML PO SUSP
30.0000 mL | Freq: Once | ORAL | Status: AC
  Administered 2023-09-07: 30 mL via ORAL
  Filled 2023-09-07: qty 30

## 2023-09-07 MED ORDER — FAMOTIDINE 20 MG PO TABS: 20.0000 mg | ORAL_TABLET | Freq: Two times a day (BID) | ORAL | 0 refills | Status: DC

## 2023-09-07 NOTE — ED Notes (Signed)
This RN called Group Home and spoke with Maurine Minister. He advised they would be here within the hour.

## 2023-09-07 NOTE — ED Provider Notes (Signed)
[facility] Provider Note    Event Date/Time   First MD Initiated Contact with Patient 09/07/23 1928     (approximate)   History   Chief Complaint: Can't Walk   HPI  Tammy [name] is a 62 y.o. female with a history of hypertension diabetes COPD on chronic nasal cannula oxygen CHF dementia who was sent to the ED from her group home due to deconditioning.  Reportedly the group home feels that she requires too much assistance for them, and they would like her to receive new placement through the emergency department instead of following up with her primary care doctor.  Patient denies any new complaints.  There is no reported history of trauma.  Reportedly patient does intermittently vomit after eating.  Patient denies abdominal pain  Patient was seen in the ED yesterday for similar complaints, had full lab workup and CT abdomen pelvis which were all reassuring.  There was a degree of constipation.          Physical Exam   Triage Vital Signs: ED Triage Vitals  Encounter Vitals Group     BP 09/07/23 1748 (!) 123/53     Systolic BP Percentile --      Diastolic BP Percentile --      Pulse Rate 09/07/23 1748 71     Resp 09/07/23 1748 19     Temp 09/07/23 1748 98 F (36.7 C)     Temp src --      SpO2 09/07/23 1748 96 %     Weight --      Height --      Head Circumference --      Peak Flow --      Pain Score 09/07/23 1744 0     Pain Loc --      Pain Education --      Exclude from Growth Chart --     Most recent vital signs: Vitals:   09/07/23 1748  BP: (!) 123/53  Pulse: 71  Resp: 19  Temp: 98 F (36.7 C)  SpO2: 96%    General: Awake, no distress.  CV:  Good peripheral perfusion.  Regular rate and rhythm Resp:  Normal effort.  Clear to auscultation bilaterally Abd:  No distention.  Soft nontender Other:  Cranial nerves II through XII intact.  Full range of motion all extremities, intact motor strength bilaterally without drift.   No signs of acute stroke  ED Results / Procedures / Treatments   Labs (all labs ordered are listed, but only abnormal results are displayed) Labs Reviewed  COMPREHENSIVE METABOLIC PANEL - Abnormal; Notable for the following components:      Result Value   Glucose, Bld 133 (*)    Creatinine, Ser 1.05 (*)    All other components within normal limits  CBC - Abnormal; Notable for the following components:   HCT 46.3 (*)    All other components within normal limits  LIPASE, BLOOD  URINALYSIS, ROUTINE W REFLEX MICROSCOPIC     EKG    RADIOLOGY    PROCEDURES:  Procedures   MEDICATIONS ORDERED IN ED: Medications  ondansetron (ZOFRAN-ODT) disintegrating tablet 4 mg (4 mg Oral Given 09/07/23 1948)  alum & mag hydroxide-simeth (MAALOX/MYLANTA) 200-200-20 MG/5ML suspension 30 mL (30 mLs Oral Given 09/07/23 1949)     IMPRESSION / MDM / ASSESSMENT AND PLAN / ED COURSE  I reviewed the triage vital signs and the nursing notes.  DDx: Dehydration, gastritis/GERD, AKI, electrolyte abnormality  Patient's presentation is most consistent with exacerbation of chronic illness.  Patient sent to the ED due to generalized weakness which I think is due to deconditioning and her progressive chronic baseline.  Vital signs unremarkable, patient is nontoxic with reassuring exam.  Serum labs are unremarkable.  Will give Maalox, Zofran, p.o. trial   ----------------------------------------- 8:27 PM on 09/07/2023 ----------------------------------------- Continues to feel well, tolerating oral intake, stable for discharge      FINAL CLINICAL IMPRESSION(S) / ED DIAGNOSES   Final diagnoses:  Physical deconditioning     Rx / DC Orders   ED Discharge Orders          Ordered    aluminum-magnesium hydroxide-simethicone (MAALOX) 200-200-20 MG/5ML SUSP  3 times daily before meals & bedtime        09/07/23 2026    famotidine (PEPCID) 20 MG tablet  2 times daily        09/07/23 2026              Note:  This document was prepared using Dragon voice recognition software and may include unintentional dictation errors.   Sharman Cheek, MD 09/07/23 2027

## 2023-09-07 NOTE — ED Notes (Signed)
Rn to bedside to introduce self to pt. Pt is alert and oriented to baseline. This RN is familiar with pt as I was her nurse yesterday. Pt is able to take PO meds and drink PO liquids with no complications.

## 2023-09-07 NOTE — ED Triage Notes (Addendum)
[facility]. Pt comes with c/o difficulty walking. Pt not able to walk at baseline. Facility can't handle pt and don't want to wait til Monday to see PCP.   VSS  Pt wear O2 chronically. Pt states belly pain that started today. Pt has hx of dementia.

## 2023-09-07 NOTE — Discharge Instructions (Addendum)
Your lab test and exam today are all okay.  Please follow-up with your primary care doctor for further evaluation of your chronic conditions and assistance needs.

## 2023-09-07 NOTE — ED Notes (Signed)
This RN called facility again. Staff advised they had to make a pitt stop to get her oxygen tank before picking her up. Should be here in 10 mins.

## 2023-10-10 ENCOUNTER — Ambulatory Visit
Admission: EM | Admit: 2023-10-10 | Discharge: 2023-10-10 | Disposition: A | Discharge status: Home / Self Care | Payer: Medicare Other

## 2023-10-10 ENCOUNTER — Ambulatory Visit
Admission: EM | Admit: 2023-10-10 | Discharge: 2023-10-10 | Disposition: A | Discharge status: Home / Self Care | Payer: Medicare Other | Attending: Emergency Medicine | Admitting: Emergency Medicine

## 2023-10-10 ENCOUNTER — Encounter: Payer: Self-pay | Admitting: *Deleted

## 2023-10-10 ENCOUNTER — Other Ambulatory Visit: Payer: Self-pay

## 2023-10-10 DIAGNOSIS — R410 Disorientation, unspecified: Secondary | ICD-10-CM

## 2023-10-10 LAB — POCT URINALYSIS DIP (MANUAL ENTRY)
Bilirubin, UA: NEGATIVE
Glucose, UA: 500 mg/dL — AB
Ketones, POC UA: NEGATIVE mg/dL
Nitrite, UA: NEGATIVE
Protein Ur, POC: NEGATIVE mg/dL
Spec Grav, UA: 1.01 (ref 1.010–1.025)
Urobilinogen, UA: 0.2 U/dL
pH, UA: 6 (ref 5.0–8.0)

## 2023-10-10 MED ORDER — CEPHALEXIN 500 MG PO CAPS: 500.0000 mg | ORAL_CAPSULE | Freq: Three times a day (TID) | ORAL | 0 refills | Status: DC

## 2023-10-10 MED ORDER — CEPHALEXIN 500 MG PO CAPS: 500.0000 mg | ORAL_CAPSULE | Freq: Three times a day (TID) | ORAL | 0 refills | Status: AC

## 2023-10-10 NOTE — Discharge Instructions (Signed)
 Today she was evaluated for disorientation  Urinalysis shows Tammy [name] blood cells but at this time does not show bacteria, has been sent to the lab for 3 days to determine if bacteria will grow, if this occurs you will be notified and new antibiotic sent to pharmacy  Begin cephalexin  3 times a day (every 8 hours) for 5 days  Ensure that she is drinking lots of fluid to maintain her hydration  If confusion and weakness continues to worsen please follow-up with primary doctor or for severe for symptoms please take to the nearest emergency department

## 2023-10-10 NOTE — ED Notes (Signed)
 Patient is being discharged from the Urgent Care and sent to the Emergency Department via POV . Per Shelba Pizza, NP, patient is in need of higher level of care due to confusion,abd pain, difficulty walking. Patient is aware and verbalizes understanding of plan of care.  Vitals:   10/10/23 1510  BP: 95/66  Pulse: 79  Resp: 20  Temp: 97.8 F (36.6 C)  SpO2: 93%

## 2023-10-10 NOTE — ED Provider Notes (Signed)
 Patient presents for evaluation of worsening altered mental status per caregiver, generalized weakness and lower abdominal pain.  Concern for possible UTI.  Has attempted to urinate 3 times here in clinic and given water  over any period of an hour and a half, unsuccessful.  Patient being sent to the nearest emergency department for further evaluation and management as we are unable to cath patient for urine sample as she is unable to provide 1.  Due to symptoms of generalized weakness and worsening altered mental status she may need further additional workup such as blood work and imaging.  Has had similar symptoms in the past.  History of dementia.   Teresa Shelba SAUNDERS, NP 10/10/23 1615

## 2023-10-10 NOTE — ED Provider Notes (Addendum)
 [facility]    CSN: [id number] Arrival date & time: 10/10/23  1733      History   Chief Complaint No chief complaint on file.   HPI Tammy [name] is a 63 y.o. female.   Patient presents for evaluation of worsening disorientation, generalized weakness and generalized abdominal pain beginning today.  All history obtained from caregiver as patient has dementia.  Has had similar symptoms in the past and at that time tested positive for urinary infection, treated with Macrobid  2 weeks ago.  History of COPD, chronic 2 L, CHF.  Stays in group home, with caregiver.  Concern with repeat UTI.  Past Medical History:  Diagnosis Date   Anxiety    Asthma    Bipolar 1 disorder (HCC)    CHF (congestive heart failure) (HCC)    COPD (chronic obstructive pulmonary disease) (HCC)    Dementia (HCC)    Epilepsy (HCC)    Seizures (HCC)     Patient Active Problem List   Diagnosis Date Noted   Diabetes mellitus type II, non insulin  dependent (HCC) 08/15/2023   Essential hypertension 08/15/2023   Dyslipidemia 08/15/2023   Tobacco dependence due to cigarettes 08/15/2023   Chronic diastolic CHF (congestive heart failure) (HCC) 08/14/2023   Hypothyroidism 08/14/2023   Bipolar mood disorder (HCC) 08/14/2023   Seizure-like activity (HCC) 08/03/2019   B12 deficiency 06/23/2019   Iron deficiency 06/21/2019   History of seizure 04/07/2019   Seizure disorder (HCC) 03/22/2019   Normocytic anemia 03/18/2019   Influenza A 11/10/2018   Hyponatremia 10/26/2018   Syncopal episodes 06/23/2017   Acute on chronic respiratory failure with hypoxia (HCC) 03/15/2017   COPD with acute exacerbation (HCC) 03/15/2017   Acute on chronic respiratory failure (HCC) 03/15/2017    Past Surgical History:  Procedure Laterality Date   APPENDECTOMY     hernia reapir     x 3      OB History   No obstetric history on file.      Home Medications    Prior to Admission medications   Medication Sig  Start Date End Date Taking? Authorizing Provider  cephALEXin  (KEFLEX ) 500 MG capsule Take 1 capsule (500 mg total) by mouth 3 (three) times daily for 5 days. 10/10/23 10/15/23 Yes Liron Eissler, Shelba SAUNDERS, NP  albuterol  (VENTOLIN  HFA) 108 (90 Base) MCG/ACT inhaler Inhale 1-2 puffs into the lungs every 6 (six) hours as needed for wheezing or shortness of breath.    [provider]  aluminum -magnesium  hydroxide-simethicone  (MAALOX) 200-200-20 MG/5ML SUSP Take 30 mLs by mouth 4 (four) times daily -  before meals and at bedtime. 09/07/23   Viviann Pastor, MD  busPIRone  (BUSPAR ) 30 MG tablet Take 30 mg by mouth 2 (two) times daily.     [provider]  carvedilol  (COREG ) 3.125 MG tablet Take 3.125 mg by mouth 2 (two) times daily with a meal.     [provider]  cetirizine (ZYRTEC) 10 MG tablet Take 10 mg by mouth daily.    [provider]  Cyanocobalamin  1000 MCG SUBL Place 1 tablet (1,000 mcg total) under the tongue daily. 07/01/19   Brahmanday, Govinda R, MD  empagliflozin  (JARDIANCE ) 25 MG TABS tablet Take 25 mg by mouth daily.     [provider]  famotidine  (PEPCID ) 20 MG tablet Take 1 tablet (20 mg total) by mouth 2 (two) times daily. 09/07/23   Viviann Pastor, MD  ferrous sulfate  325 (65 FE) MG EC tablet Take 1 tablet (325  mg total) by mouth daily with breakfast. 08/20/19   Brahmanday, Govinda R, MD  fluticasone  (FLONASE ) 50 MCG/ACT nasal spray Place 1 spray into both nostrils daily.     [provider]  KERENDIA  20 MG TABS Take 1 tablet by mouth daily.    [provider]  lamoTRIgine  (LAMICTAL ) 150 MG tablet Take 150 mg by mouth 2 (two) times daily.    [provider]  levETIRAcetam  (KEPPRA ) 250 MG tablet Take 250 mg by mouth 2 (two) times daily.    [provider]  levothyroxine  (SYNTHROID ) 112 MCG tablet Take 112 mcg by mouth daily before breakfast.    [provider]  lithium  carbonate 150 MG capsule Take 150 mg  by mouth daily.     [provider]  metFORMIN  (GLUCOPHAGE ) 500 MG tablet Take 500 mg by mouth 2 (two) times daily with a meal.    [provider]  nicotine  (NICODERM CQ  - DOSED IN MG/24 HOURS) 21 mg/24hr patch Place 1 patch (21 mg total) onto the skin daily. 08/17/23   Barbarann Nest, MD  Omega-3 Fatty Acids (FISH OIL) 1000 MG CAPS Take 1,000 mg by mouth 2 (two) times daily.     [provider]  ondansetron  (ZOFRAN ) 4 MG tablet Take 1 tablet (4 mg total) by mouth every 8 (eight) hours as needed for nausea or vomiting. 09/06/23   Cyrena Mylar, MD  pravastatin  (PRAVACHOL ) 40 MG tablet Take 40 mg by mouth daily.    [provider]  risperiDONE  (RISPERDAL ) 3 MG tablet Take 3 mg by mouth at bedtime.     [provider]  risperiDONE  ER (UZEDY ) 75 MG/0.21ML SUSY Inject 75 mg into the skin every 30 (thirty) days.    [provider]  rOPINIRole  (REQUIP ) 1 MG tablet Take 1 mg by mouth at bedtime.    [provider]  TRELEGY ELLIPTA 100-62.5-25 MCG/INH AEPB Inhale 1 puff into the lungs daily.  11/17/18   [provider]  Vitamin D , Ergocalciferol , (DRISDOL ) 1.25 MG (50000 UT) CAPS capsule Take 50,000 Units by mouth every 7 (seven) days.    [provider]    Family History Family History  Problem Relation Age of Onset   Hypertension Mother    Kidney cancer Neg Hx    Bladder Cancer Neg Hx    Breast cancer Neg Hx     Social History Social History   Tobacco Use   Smoking status: Former    Current packs/day: 0.00    Types: Cigarettes    Quit date: 10/07/1998    Years since quitting: 25.0   Smokeless tobacco: Never  Vaping Use   Vaping status: Never Used  Substance Use Topics   Alcohol use: No   Drug use: No     Allergies   Penicillins and Prednisone    Review of Systems Review of Systems   Physical Exam Triage Vital Signs ED Triage Vitals  Encounter Vitals Group     BP      Systolic BP Percentile       Diastolic BP Percentile      Pulse      Resp      Temp      Temp src      SpO2      Weight      Height      Head Circumference      Peak Flow      Pain Score      Pain Loc  Pain Education      Exclude from Growth Chart    No data found.  Updated Vital Signs There were no vitals taken for this visit.  Visual Acuity Right Eye Distance:   Left Eye Distance:   Bilateral Distance:    Right Eye Near:   Left Eye Near:    Bilateral Near:     Physical Exam Constitutional:      Appearance: Normal appearance.  Eyes:     Extraocular Movements: Extraocular movements intact.  Pulmonary:     Effort: Pulmonary effort is normal.  Abdominal:     General: Abdomen is flat. Bowel sounds are normal. There is no distension.     Palpations: Abdomen is soft.     Tenderness: There is no abdominal tenderness. There is no right CVA tenderness, left CVA tenderness or guarding.  Neurological:     Mental Status: She is alert and oriented to person, place, and time. Mental status is at baseline.      UC Treatments / Results  Labs (all labs ordered are listed, but only abnormal results are displayed) Labs Reviewed  POCT URINALYSIS DIP (MANUAL ENTRY) - Abnormal; Notable for the following components:      Result Value   Clarity, UA cloudy (*)    Glucose, UA =500 (*)    Blood, UA trace-intact (*)    Leukocytes, UA Small (1+) (*)    All other components within normal limits  URINE CULTURE    EKG   Radiology No results found.  Procedures Procedures (including critical care time)  Medications Ordered in UC Medications - No data to display  Initial Impression / Assessment and Plan / UC Course  I have reviewed the triage vital signs and the nursing notes.  Pertinent labs & imaging results that were available during my care of the patient were reviewed by me and considered in my medical decision making (see chart for details).  Disorientation  Oriented to 1, unable to  complete neurological exam due to confusion, has dementia at baseline, has worsened, or recent evaluation within the last month for confusion and deconditioning, believed to be at that time worsening dementia as cause, discussed limitations on workup in clinic, was in urgent care earlier today but was unable to provide urinary sample therefore was sent to the emergency department however upon returning back to group home able to collect sample and specimen cup provided by clinic and returned for diagnostics, urinalysis showing leukocytes, do not have access to chart review for recent UTI evaluation, sent for culture, prescribed cephalexin  as she did see improvement with symptoms or recent antibiotic, if testing is negative, patient is to follow-up with PCP or emergency department for further evaluation Final Clinical Impressions(s) / UC Diagnoses   Final diagnoses:  Disorientation, unspecified     Discharge Instructions      Today she was evaluated for disorientation  Urinalysis shows Deborrah Mabin blood cells but at this time does not show bacteria, has been sent to the lab for 3 days to determine if bacteria will grow, if this occurs you will be notified and new antibiotic sent to pharmacy  Begin cephalexin  3 times a day (every 8 hours) for 5 days  Ensure that she is drinking lots of fluid to maintain her hydration  If confusion and weakness continues to worsen please follow-up with primary doctor or for severe for symptoms please take to the nearest emergency department   ED Prescriptions     Medication Sig Dispense Auth.  Provider   cephALEXin  (KEFLEX ) 500 MG capsule Take 1 capsule (500 mg total) by mouth 3 (three) times daily for 5 days. 15 capsule Ayinde Swim R, NP      PDMP not reviewed this encounter.   Teresa Shelba SAUNDERS, NP 10/10/23 1944    Teresa Shelba SAUNDERS, NP 10/10/23 1945

## 2023-10-10 NOTE — ED Triage Notes (Signed)
 Pt's caregiver (pt from [facility]) states pt had UTI in December and was treated with Macrobid . States today she is confused and having difficulty walking. She is on 2L oxygen and has hx of dementia. He voices concern for UTI. Pt was c/o stomach pain today. Unable to give urine sample at this time

## 2023-10-13 LAB — URINE CULTURE

## 2023-10-13 LAB — SPECIMEN STATUS REPORT

## 2023-11-06 ENCOUNTER — Encounter: Payer: Self-pay | Admitting: Internal Medicine

## 2023-11-24 ENCOUNTER — Encounter: Payer: Self-pay | Admitting: Podiatry

## 2023-11-24 ENCOUNTER — Ambulatory Visit (INDEPENDENT_AMBULATORY_CARE_PROVIDER_SITE_OTHER): Payer: Medicare Other | Admitting: Podiatry

## 2023-11-24 VITALS — Ht 62.0 in | Wt 157.8 lb

## 2023-11-24 DIAGNOSIS — E119 Type 2 diabetes mellitus without complications: Secondary | ICD-10-CM

## 2023-11-24 DIAGNOSIS — M79676 Pain in unspecified toe(s): Secondary | ICD-10-CM | POA: Diagnosis not present

## 2023-11-24 DIAGNOSIS — B351 Tinea unguium: Secondary | ICD-10-CM

## 2023-11-24 DIAGNOSIS — M79609 Pain in unspecified limb: Secondary | ICD-10-CM

## 2023-11-30 ENCOUNTER — Encounter: Payer: Self-pay | Admitting: Podiatry

## 2023-11-30 NOTE — Progress Notes (Signed)
  Subjective:  Patient ID: Tammy [name], female    DOB: [birth year],  MRN: [id number]  Tammy [name] presents to clinic today for: preventative diabetic foot care and painful, elongated thickened toenails x 10 which are symptomatic when wearing enclosed shoe gear. This interferes with his/her daily activities.  Chief Complaint  Patient presents with   Nail Problem    Pt is here for [facility] PCP is Dr Clint Guy and LOV was in September.    PCP is Armando Gang, FNP.  Allergies  Allergen Reactions   Penicillins Rash    Has patient had a PCN reaction causing immediate rash, facial/tongue/throat swelling, SOB or lightheadedness with hypotension: No Has patient had a PCN reaction causing severe rash involving mucus membranes or skin necrosis: No Has patient had a PCN reaction that required hospitalization: No Has patient had a PCN reaction occurring within the last 10 years: No If all of the above answers are "NO", then may proceed with Cephalosporin use.    Prednisone Rash    Review of Systems: Negative except as noted in the HPI.  Objective: No changes noted in today's physical examination. There were no vitals filed for this visit.  Tammy [name] is a pleasant 63 y.o. female WD, WN in NAD. AAO x 3.On supplemental oxygen via nasal cannula.  Vascular Examination: Capillary refill time <3 seconds b/l LE. Palpable pedal pulses b/l LE. Digital hair present b/l. No pedal edema b/l. Skin temperature gradient WNL b/l. No varicosities b/l. Marland Kitchen  Dermatological Examination: Pedal skin with normal turgor, texture and tone b/l. No open wounds. No interdigital macerations b/l. Toenails 1-5 b/l thickened, discolored, dystrophic with subungual debris. There is pain on palpation to dorsal aspect of nailplates. No corns, calluses nor porokeratotic lesions noted..  Neurological Examination: Protective sensation intact with 10 gram monofilament b/l LE. Vibratory sensation intact b/l LE.    Musculoskeletal Examination: Muscle strength 5/5 to all lower extremity muscle groups bilaterally. No pain, crepitus or joint limitation noted with ROM bilateral LE. No gross bony deformities bilaterally.     Latest Ref Rng & Units 08/16/2023    4:28 AM  Hemoglobin A1C  Hemoglobin-A1c 4.8 - 5.6 % 6.5    Assessment/Plan: 1. Pain due to onychomycosis of nail   2. Diabetes mellitus without complication [facility])    Patient was evaluated and treated. All patient's and/or POA's questions/concerns addressed on today's visit. Mycotic toenails 1-5 debrided in length and girth without incident.  Continue daily foot inspections and monitor blood glucose per PCP/Endocrinologist's recommendations.Continue soft, supportive shoe gear daily. Report any pedal injuries to medical professional. Call office if there are any quesitons/concerns. -Patient/POA to call should there be question/concern in the interim.   Return in about 3 months (around 02/21/2024).  Freddie Breech, DPM      [place] LOCATION: [address] [place] [address]                   Office [phone]   [facility] LOCATION: [address] [place], [place] [address] Office [phone]

## 2023-12-04 ENCOUNTER — Emergency Department: Payer: Medicare Other

## 2023-12-04 ENCOUNTER — Emergency Department
Admission: EM | Admit: 2023-12-04 | Discharge: 2023-12-04 | Disposition: A | Discharge status: Home / Self Care | Payer: Medicare Other | Attending: Emergency Medicine | Admitting: Emergency Medicine

## 2023-12-04 ENCOUNTER — Encounter: Payer: Self-pay | Admitting: Internal Medicine

## 2023-12-04 ENCOUNTER — Other Ambulatory Visit: Payer: Self-pay

## 2023-12-04 DIAGNOSIS — E119 Type 2 diabetes mellitus without complications: Secondary | ICD-10-CM | POA: Diagnosis not present

## 2023-12-04 DIAGNOSIS — J9611 Chronic respiratory failure with hypoxia: Secondary | ICD-10-CM | POA: Diagnosis not present

## 2023-12-04 DIAGNOSIS — J441 Chronic obstructive pulmonary disease with (acute) exacerbation: Secondary | ICD-10-CM | POA: Diagnosis not present

## 2023-12-04 DIAGNOSIS — J101 Influenza due to other identified influenza virus with other respiratory manifestations: Secondary | ICD-10-CM | POA: Insufficient documentation

## 2023-12-04 DIAGNOSIS — R0602 Shortness of breath: Secondary | ICD-10-CM | POA: Diagnosis present

## 2023-12-04 HISTORY — DX: Type 2 diabetes mellitus without complications: E11.9

## 2023-12-04 LAB — CBC WITH DIFFERENTIAL/PLATELET
Abs Immature Granulocytes: 0.02 10*3/uL (ref 0.00–0.07)
Basophils Absolute: 0 10*3/uL (ref 0.0–0.1)
Basophils Relative: 0 %
Eosinophils Absolute: 0 10*3/uL (ref 0.0–0.5)
Eosinophils Relative: 1 %
HCT: 40.3 % (ref 36.0–46.0)
Hemoglobin: 12.7 g/dL (ref 12.0–15.0)
Immature Granulocytes: 0 %
Lymphocytes Relative: 20 %
Lymphs Abs: 1 10*3/uL (ref 0.7–4.0)
MCH: 30.3 pg (ref 26.0–34.0)
MCHC: 31.5 g/dL (ref 30.0–36.0)
MCV: 96.2 fL (ref 80.0–100.0)
Monocytes Absolute: 0.4 10*3/uL (ref 0.1–1.0)
Monocytes Relative: 7 %
Neutro Abs: 3.6 10*3/uL (ref 1.7–7.7)
Neutrophils Relative %: 72 %
Platelets: 124 10*3/uL — ABNORMAL LOW (ref 150–400)
RBC: 4.19 MIL/uL (ref 3.87–5.11)
RDW: 14.9 % (ref 11.5–15.5)
WBC: 5.1 10*3/uL (ref 4.0–10.5)
nRBC: 0 % (ref 0.0–0.2)

## 2023-12-04 LAB — BASIC METABOLIC PANEL
Anion gap: 6 (ref 5–15)
BUN: 18 mg/dL (ref 8–23)
CO2: 23 mmol/L (ref 22–32)
Calcium: 8.6 mg/dL — ABNORMAL LOW (ref 8.9–10.3)
Chloride: 107 mmol/L (ref 98–111)
Creatinine, Ser: 1 mg/dL (ref 0.44–1.00)
GFR, Estimated: >60 mL/min (ref >60)
Glucose, Bld: 122 mg/dL — ABNORMAL HIGH (ref 70–99)
Potassium: 4.1 mmol/L (ref 3.5–5.1)
Sodium: 136 mmol/L (ref 135–145)

## 2023-12-04 LAB — RESP PANEL BY RT-PCR (RSV, FLU A&B, COVID)  RVPGX2
Influenza A by PCR: POSITIVE — AB
Influenza B by PCR: NEGATIVE
Resp Syncytial Virus by PCR: NEGATIVE
SARS Coronavirus 2 by RT PCR: NEGATIVE

## 2023-12-04 MED ORDER — DOXYCYCLINE HYCLATE 100 MG PO CAPS: 100.0000 mg | ORAL_CAPSULE | Freq: Two times a day (BID) | ORAL | 0 refills | Status: DC

## 2023-12-04 MED ORDER — METHYLPREDNISOLONE 4 MG PO TBPK: ORAL_TABLET | ORAL | 0 refills | Status: DC

## 2023-12-04 MED ORDER — METHYLPREDNISOLONE SODIUM SUCC 125 MG IJ SOLR
125.0000 mg | INTRAMUSCULAR | Status: AC
  Administered 2023-12-04: 125 mg via INTRAVENOUS
  Filled 2023-12-04: qty 2

## 2023-12-04 MED ORDER — IPRATROPIUM-ALBUTEROL 0.5-2.5 (3) MG/3ML IN SOLN
9.0000 mL | Freq: Once | RESPIRATORY_TRACT | Status: AC
  Administered 2023-12-04: 9 mL via RESPIRATORY_TRACT
  Filled 2023-12-04: qty 3

## 2023-12-04 NOTE — ED Notes (Signed)
 O2 titrated to 3L to maintain SpO2 >92%

## 2023-12-04 NOTE — ED Provider Notes (Signed)
 [facility] Provider Note    Event Date/Time   First MD Initiated Contact with Patient 12/04/23 [phone]     (approximate)   History   Chief Complaint: Shortness of Breath   HPI  Tammy [name] is a 63 y.o. female with a history of diabetes COPD on 2 L nasal cannula oxygen at all times bipolar disorder living in a group home who comes to the ED complaining of shortness of breath and nonproductive cough, gradually worsening over the last 2 days.  This morning at breakfast felt very short of breath and received albuterol treatment at her group home.  She does report feeling much better afterward.  Denies any pain.   Group home staff had reported to EMS that multiple people have been sick with a viral syndrome recently.       Physical Exam   Triage Vital Signs: ED Triage Vitals [12/04/23 0910]  Encounter Vitals Group     BP      Systolic BP Percentile      Diastolic BP Percentile      Pulse      Resp      Temp      Temp src      SpO2      Weight 147 lb 3.2 oz (66.8 kg)     Height      Head Circumference      Peak Flow      Pain Score      Pain Loc      Pain Education      Exclude from Growth Chart     Most recent vital signs: Vitals:   12/04/23 0917 12/04/23 1030  BP: 112/64 125/62  Pulse: 70 72  Resp: (!) 24 17  Temp: 98.2 F (36.8 C)   SpO2: 92% 92%    General: Awake, no distress.  CV:  Good peripheral perfusion.  Regular rate rhythm Resp:  Normal effort.  Diminished air movement bilaterally.  Expiratory wheezing, prolonged expiratory phase Abd:  No distention.  Soft nontender Other:  No lower extremity edema   ED Results / Procedures / Treatments   Labs (all labs ordered are listed, but only abnormal results are displayed) Labs Reviewed  RESP PANEL BY RT-PCR (RSV, FLU A&B, COVID)  RVPGX2 - Abnormal; Notable for the following components:      Result Value   Influenza A by PCR POSITIVE (*)    All other components within  normal limits  BASIC METABOLIC PANEL - Abnormal; Notable for the following components:   Glucose, Bld 122 (*)    Calcium 8.6 (*)    All other components within normal limits  CBC WITH DIFFERENTIAL/PLATELET - Abnormal; Notable for the following components:   Platelets 124 (*)    All other components within normal limits     EKG Interpreted by me Sinus rhythm rate of 72.  Normal axis and intervals.  No acute ischemic changes.   RADIOLOGY Chest x-ray interpreted me, no focal consolidation.  Radiology report reviewed   PROCEDURES:  Procedures   MEDICATIONS ORDERED IN ED: Medications  methylPREDNISolone sodium succinate (SOLU-MEDROL) 125 mg/2 mL injection 125 mg (125 mg Intravenous Given 12/04/23 0921)  ipratropium-albuterol (DUONEB) 0.5-2.5 (3) MG/3ML nebulizer solution 9 mL (9 mLs Nebulization Given 12/04/23 0923)     IMPRESSION / MDM / ASSESSMENT AND PLAN / ED COURSE  I reviewed the triage vital signs and the nursing notes.  DDx: COPD exacerbation, COVID, influenza, pneumonia, pulmonary edema,  pleural effusion.  Doubt ACS PE dissection pericardial effusion  Patient's presentation is most consistent with acute presentation with potential threat to life or bodily function.  Patient presents with shortness of breath and cough, likely viral respiratory illness.  Will check chest x-ray labs respiratory viral swab while giving Solu-Medrol and bronchodilators.   ----------------------------------------- 10:43 AM on 12/04/2023 ----------------------------------------- Oxygen saturation remains 91% on her usual 2 L nasal cannula.  Symmetric air movement bilaterally, no wheezing, normal expiratory phase, no cough.  Flu test is positive, otherwise reassuring workup, stable for discharge.      FINAL CLINICAL IMPRESSION(S) / ED DIAGNOSES   Final diagnoses:  Influenza A  COPD exacerbation (HCC)  Chronic respiratory failure with hypoxia (HCC)     Rx / DC Orders   ED  Discharge Orders          Ordered    methylPREDNISolone (MEDROL DOSEPAK) 4 MG TBPK tablet        12/04/23 1042    doxycycline (VIBRAMYCIN) 100 MG capsule  2 times daily        12/04/23 1042             Note:  This document was prepared using Dragon voice recognition software and may include unintentional dictation errors.   Sharman Cheek, MD 12/04/23 1043

## 2023-12-04 NOTE — ED Triage Notes (Signed)
 Pt to ED [facility] from community [facility] home for [facility]ob, has been sick for a couple days. COPD, CHF. Received albuterol PTA, feels better after. Took tylenol PTA. Wears 2 L [place] chronic. +cough

## 2023-12-04 NOTE — Discharge Instructions (Signed)
 Your Flu test is positive.  You can increase your oxygen as needed over the next few days until you are feeling better. Take doxycycline to prevent pneumonia. Take Medrol Dosepak to control lung inflammation along with your inhalers.

## 2023-12-04 NOTE — ED Notes (Signed)
Bed alarm on at this time

## 2023-12-04 NOTE — ED Notes (Signed)
 Staff member from group home verbalizes understanding of discharge instructions. Opportunity for questioning and answers were provided. Pt discharged from ED to group home with staff member.

## 2023-12-04 NOTE — ED Notes (Signed)
 This RN spoke with Cari Caraway at [facility] [phone], states they will come pick her up as soon as able and should be before lunch today.

## 2023-12-10 ENCOUNTER — Inpatient Hospital Stay
Admission: EM | Admit: 2023-12-10 | Discharge: 2023-12-14 | DRG: 190 | Disposition: A | Discharge status: Home / Self Care | Attending: Internal Medicine | Admitting: Internal Medicine

## 2023-12-10 ENCOUNTER — Other Ambulatory Visit: Payer: Self-pay

## 2023-12-10 ENCOUNTER — Encounter: Payer: Self-pay | Admitting: Internal Medicine

## 2023-12-10 ENCOUNTER — Emergency Department

## 2023-12-10 DIAGNOSIS — J9622 Acute and chronic respiratory failure with hypercapnia: Secondary | ICD-10-CM

## 2023-12-10 DIAGNOSIS — G40909 Epilepsy, unspecified, not intractable, without status epilepticus: Secondary | ICD-10-CM | POA: Diagnosis present

## 2023-12-10 DIAGNOSIS — F419 Anxiety disorder, unspecified: Secondary | ICD-10-CM

## 2023-12-10 DIAGNOSIS — Z79899 Other long term (current) drug therapy: Secondary | ICD-10-CM | POA: Diagnosis not present

## 2023-12-10 DIAGNOSIS — I10 Essential (primary) hypertension: Secondary | ICD-10-CM | POA: Diagnosis not present

## 2023-12-10 DIAGNOSIS — Z87891 Personal history of nicotine dependence: Secondary | ICD-10-CM

## 2023-12-10 DIAGNOSIS — J441 Chronic obstructive pulmonary disease with (acute) exacerbation: Principal | ICD-10-CM

## 2023-12-10 DIAGNOSIS — Z880 Allergy status to penicillin: Secondary | ICD-10-CM

## 2023-12-10 DIAGNOSIS — N189 Chronic kidney disease, unspecified: Secondary | ICD-10-CM | POA: Diagnosis present

## 2023-12-10 DIAGNOSIS — E039 Hypothyroidism, unspecified: Secondary | ICD-10-CM | POA: Diagnosis present

## 2023-12-10 DIAGNOSIS — F0393 Unspecified dementia, unspecified severity, with mood disturbance: Secondary | ICD-10-CM | POA: Diagnosis present

## 2023-12-10 DIAGNOSIS — Z7984 Long term (current) use of oral hypoglycemic drugs: Secondary | ICD-10-CM | POA: Diagnosis not present

## 2023-12-10 DIAGNOSIS — J9620 Acute and chronic respiratory failure, unspecified whether with hypoxia or hypercapnia: Secondary | ICD-10-CM | POA: Diagnosis present

## 2023-12-10 DIAGNOSIS — E1122 Type 2 diabetes mellitus with diabetic chronic kidney disease: Secondary | ICD-10-CM | POA: Diagnosis present

## 2023-12-10 DIAGNOSIS — R1310 Dysphagia, unspecified: Secondary | ICD-10-CM | POA: Diagnosis present

## 2023-12-10 DIAGNOSIS — Z9981 Dependence on supplemental oxygen: Secondary | ICD-10-CM | POA: Diagnosis not present

## 2023-12-10 DIAGNOSIS — F0394 Unspecified dementia, unspecified severity, with anxiety: Secondary | ICD-10-CM | POA: Diagnosis present

## 2023-12-10 DIAGNOSIS — J9621 Acute and chronic respiratory failure with hypoxia: Secondary | ICD-10-CM

## 2023-12-10 DIAGNOSIS — I129 Hypertensive chronic kidney disease with stage 1 through stage 4 chronic kidney disease, or unspecified chronic kidney disease: Secondary | ICD-10-CM | POA: Diagnosis present

## 2023-12-10 DIAGNOSIS — Z888 Allergy status to other drugs, medicaments and biological substances status: Secondary | ICD-10-CM

## 2023-12-10 DIAGNOSIS — Z794 Long term (current) use of insulin: Secondary | ICD-10-CM | POA: Diagnosis not present

## 2023-12-10 DIAGNOSIS — E785 Hyperlipidemia, unspecified: Secondary | ICD-10-CM

## 2023-12-10 DIAGNOSIS — F319 Bipolar disorder, unspecified: Secondary | ICD-10-CM | POA: Diagnosis present

## 2023-12-10 DIAGNOSIS — K219 Gastro-esophageal reflux disease without esophagitis: Secondary | ICD-10-CM

## 2023-12-10 DIAGNOSIS — J111 Influenza due to unidentified influenza virus with other respiratory manifestations: Secondary | ICD-10-CM | POA: Diagnosis present

## 2023-12-10 DIAGNOSIS — Z79890 Hormone replacement therapy (postmenopausal): Secondary | ICD-10-CM | POA: Diagnosis not present

## 2023-12-10 DIAGNOSIS — Z8249 Family history of ischemic heart disease and other diseases of the circulatory system: Secondary | ICD-10-CM | POA: Diagnosis not present

## 2023-12-10 LAB — CBC WITH DIFFERENTIAL/PLATELET
Abs Immature Granulocytes: 0.07 10*3/uL (ref 0.00–0.07)
Basophils Absolute: 0 10*3/uL (ref 0.0–0.1)
Basophils Relative: 0 %
Eosinophils Absolute: 0.1 10*3/uL (ref 0.0–0.5)
Eosinophils Relative: 1 %
HCT: 43.9 % (ref 36.0–46.0)
Hemoglobin: 13.7 g/dL (ref 12.0–15.0)
Immature Granulocytes: 1 %
Lymphocytes Relative: 15 %
Lymphs Abs: 1 10*3/uL (ref 0.7–4.0)
MCH: 30.2 pg (ref 26.0–34.0)
MCHC: 31.2 g/dL (ref 30.0–36.0)
MCV: 96.7 fL (ref 80.0–100.0)
Monocytes Absolute: 0.6 10*3/uL (ref 0.1–1.0)
Monocytes Relative: 9 %
Neutro Abs: 5 10*3/uL (ref 1.7–7.7)
Neutrophils Relative %: 74 %
Platelets: 271 10*3/uL (ref 150–400)
RBC: 4.54 MIL/uL (ref 3.87–5.11)
RDW: 14.3 % (ref 11.5–15.5)
WBC: 6.8 10*3/uL (ref 4.0–10.5)
nRBC: 0 % (ref 0.0–0.2)

## 2023-12-10 LAB — COMPREHENSIVE METABOLIC PANEL
ALT: 25 U/L (ref 0–44)
AST: 20 U/L (ref 15–41)
Albumin: 3.3 g/dL — ABNORMAL LOW (ref 3.5–5.0)
Alkaline Phosphatase: 80 U/L (ref 38–126)
Anion gap: 7 (ref 5–15)
BUN: 15 mg/dL (ref 8–23)
CO2: 30 mmol/L (ref 22–32)
Calcium: 9.2 mg/dL (ref 8.9–10.3)
Chloride: 104 mmol/L (ref 98–111)
Creatinine, Ser: 1.05 mg/dL — ABNORMAL HIGH (ref 0.44–1.00)
GFR, Estimated: >60 mL/min (ref >60)
Glucose, Bld: 156 mg/dL — ABNORMAL HIGH (ref 70–99)
Potassium: 4.4 mmol/L (ref 3.5–5.1)
Sodium: 141 mmol/L (ref 135–145)
Total Bilirubin: 0.7 mg/dL (ref 0.0–1.2)
Total Protein: 6 g/dL — ABNORMAL LOW (ref 6.5–8.1)

## 2023-12-10 LAB — TROPONIN I (HIGH SENSITIVITY): Troponin I (High Sensitivity): 19 ng/L — ABNORMAL HIGH (ref <18)

## 2023-12-10 LAB — BRAIN NATRIURETIC PEPTIDE: B Natriuretic Peptide: 27.8 pg/mL (ref 0.0–100.0)

## 2023-12-10 MED ORDER — IPRATROPIUM-ALBUTEROL 0.5-2.5 (3) MG/3ML IN SOLN
3.0000 mL | Freq: Four times a day (QID) | RESPIRATORY_TRACT | Status: DC
  Administered 2023-12-11: 3 mL via RESPIRATORY_TRACT
  Filled 2023-12-10: qty 3

## 2023-12-10 MED ORDER — SODIUM CHLORIDE 0.9 % IV SOLN: INTRAVENOUS | Status: DC

## 2023-12-10 MED ORDER — ONDANSETRON HCL 4 MG/2ML IJ SOLN
4.0000 mg | Freq: Four times a day (QID) | INTRAMUSCULAR | Status: DC | PRN
Start: 2023-12-10 — End: 2023-12-14

## 2023-12-10 MED ORDER — IPRATROPIUM-ALBUTEROL 0.5-2.5 (3) MG/3ML IN SOLN
3.0000 mL | Freq: Once | RESPIRATORY_TRACT | Status: AC
  Administered 2023-12-10: 3 mL via RESPIRATORY_TRACT
  Filled 2023-12-10: qty 3

## 2023-12-10 MED ORDER — TRAZODONE HCL 50 MG PO TABS
25.0000 mg | ORAL_TABLET | Freq: Every evening | ORAL | Status: DC | PRN
  Administered 2023-12-11: 25 mg via ORAL
  Filled 2023-12-10: qty 1

## 2023-12-10 MED ORDER — ALUM & MAG HYDROXIDE-SIMETH 200-200-20 MG/5ML PO SUSP: 30.0000 mL | Freq: Three times a day (TID) | ORAL | Status: DC | PRN

## 2023-12-10 MED ORDER — ACETAMINOPHEN 325 MG PO TABS
650.0000 mg | ORAL_TABLET | Freq: Four times a day (QID) | ORAL | Status: DC | PRN
  Administered 2023-12-11: 650 mg via ORAL
  Filled 2023-12-10: qty 2

## 2023-12-10 MED ORDER — METHYLPREDNISOLONE SODIUM SUCC 125 MG IJ SOLR
125.0000 mg | Freq: Once | INTRAMUSCULAR | Status: AC
  Administered 2023-12-10: 125 mg via INTRAVENOUS
  Filled 2023-12-10: qty 2

## 2023-12-10 MED ORDER — CARVEDILOL 3.125 MG PO TABS
3.1250 mg | ORAL_TABLET | Freq: Two times a day (BID) | ORAL | Status: DC
  Administered 2023-12-11 – 2023-12-14 (×7): 3.125 mg via ORAL
  Filled 2023-12-10 (×7): qty 1

## 2023-12-10 MED ORDER — NICOTINE 21 MG/24HR TD PT24
21.0000 mg | MEDICATED_PATCH | Freq: Every day | TRANSDERMAL | Status: DC
  Administered 2023-12-11 – 2023-12-14 (×4): 21 mg via TRANSDERMAL
  Filled 2023-12-10 (×4): qty 1

## 2023-12-10 MED ORDER — ONDANSETRON HCL 4 MG PO TABS
4.0000 mg | ORAL_TABLET | Freq: Four times a day (QID) | ORAL | Status: DC | PRN
Start: 2023-12-10 — End: 2023-12-14

## 2023-12-10 MED ORDER — FLUTICASONE PROPIONATE 50 MCG/ACT NA SUSP: 1.0000 | Freq: Every day | NASAL | Status: DC | PRN

## 2023-12-10 MED ORDER — SODIUM CHLORIDE 0.9 % IV SOLN
1.0000 g | INTRAVENOUS | Status: DC
  Administered 2023-12-11: 1 g via INTRAVENOUS
  Filled 2023-12-10: qty 10

## 2023-12-10 MED ORDER — BUSPIRONE HCL 10 MG PO TABS
30.0000 mg | ORAL_TABLET | Freq: Two times a day (BID) | ORAL | Status: DC
Start: 2023-12-11 — End: 2023-12-14
  Administered 2023-12-11 – 2023-12-14 (×7): 30 mg via ORAL
  Filled 2023-12-10 (×7): qty 3

## 2023-12-10 MED ORDER — LORATADINE 10 MG PO TABS
10.0000 mg | ORAL_TABLET | Freq: Every day | ORAL | Status: DC
  Administered 2023-12-11 – 2023-12-14 (×4): 10 mg via ORAL
  Filled 2023-12-10 (×4): qty 1

## 2023-12-10 MED ORDER — RISPERIDONE 3 MG PO TABS
3.0000 mg | ORAL_TABLET | Freq: Every day | ORAL | Status: DC
  Administered 2023-12-11 – 2023-12-14 (×3): 3 mg via ORAL
  Filled 2023-12-10: qty 3
  Filled 2023-12-10: qty 1
  Filled 2023-12-10: qty 3

## 2023-12-10 MED ORDER — OMEGA-3-ACID ETHYL ESTERS 1 G PO CAPS
1.0000 g | ORAL_CAPSULE | Freq: Two times a day (BID) | ORAL | Status: DC
  Administered 2023-12-11 – 2023-12-14 (×7): 1 g via ORAL
  Filled 2023-12-10 (×7): qty 1

## 2023-12-10 MED ORDER — METHYLPREDNISOLONE SODIUM SUCC 40 MG IJ SOLR
40.0000 mg | Freq: Two times a day (BID) | INTRAMUSCULAR | Status: AC
  Administered 2023-12-11 (×2): 40 mg via INTRAVENOUS
  Filled 2023-12-10 (×2): qty 1

## 2023-12-10 MED ORDER — MAGNESIUM HYDROXIDE 400 MG/5ML PO SUSP: 30.0000 mL | Freq: Every day | ORAL | Status: DC | PRN

## 2023-12-10 MED ORDER — RISPERIDONE ER 75 MG/0.21ML ~~LOC~~ SUSY: 75.0000 mg | PREFILLED_SYRINGE | SUBCUTANEOUS | Status: DC

## 2023-12-10 MED ORDER — LEVETIRACETAM 250 MG PO TABS
250.0000 mg | ORAL_TABLET | Freq: Two times a day (BID) | ORAL | Status: DC
  Administered 2023-12-11 – 2023-12-14 (×8): 250 mg via ORAL
  Filled 2023-12-10 (×9): qty 1

## 2023-12-10 MED ORDER — ROPINIROLE HCL 1 MG PO TABS
1.0000 mg | ORAL_TABLET | Freq: Every day | ORAL | Status: DC
  Administered 2023-12-11 – 2023-12-13 (×3): 1 mg via ORAL
  Filled 2023-12-10 (×3): qty 1

## 2023-12-10 MED ORDER — LAMOTRIGINE 100 MG PO TABS
150.0000 mg | ORAL_TABLET | Freq: Two times a day (BID) | ORAL | Status: DC
  Administered 2023-12-11 – 2023-12-14 (×8): 150 mg via ORAL
  Filled 2023-12-10 (×8): qty 2

## 2023-12-10 MED ORDER — ENOXAPARIN SODIUM 40 MG/0.4ML IJ SOSY
40.0000 mg | PREFILLED_SYRINGE | INTRAMUSCULAR | Status: DC
  Administered 2023-12-10 – 2023-12-13 (×4): 40 mg via SUBCUTANEOUS
  Filled 2023-12-10 (×4): qty 0.4

## 2023-12-10 MED ORDER — PRAVASTATIN SODIUM 20 MG PO TABS
40.0000 mg | ORAL_TABLET | Freq: Every day | ORAL | Status: DC
  Administered 2023-12-11 – 2023-12-14 (×4): 40 mg via ORAL
  Filled 2023-12-10 (×4): qty 2

## 2023-12-10 MED ORDER — VITAMIN B-12 1000 MCG PO TABS
1000.0000 ug | ORAL_TABLET | Freq: Every day | ORAL | Status: DC
  Administered 2023-12-11 – 2023-12-14 (×4): 1000 ug via ORAL
  Filled 2023-12-10 (×4): qty 1

## 2023-12-10 MED ORDER — FAMOTIDINE 20 MG PO TABS
20.0000 mg | ORAL_TABLET | Freq: Every day | ORAL | Status: DC
  Administered 2023-12-11 – 2023-12-14 (×4): 20 mg via ORAL
  Filled 2023-12-10 (×4): qty 1

## 2023-12-10 MED ORDER — GUAIFENESIN ER 600 MG PO TB12
600.0000 mg | ORAL_TABLET | Freq: Two times a day (BID) | ORAL | Status: DC
  Administered 2023-12-11 – 2023-12-14 (×8): 600 mg via ORAL
  Filled 2023-12-10 (×8): qty 1

## 2023-12-10 MED ORDER — VITAMIN D (ERGOCALCIFEROL) 1.25 MG (50000 UNIT) PO CAPS: 50000.0000 [IU] | ORAL_CAPSULE | ORAL | Status: DC

## 2023-12-10 MED ORDER — HYDROCOD POLI-CHLORPHE POLI ER 10-8 MG/5ML PO SUER
5.0000 mL | Freq: Two times a day (BID) | ORAL | Status: DC | PRN
  Administered 2023-12-11 – 2023-12-13 (×4): 5 mL via ORAL
  Filled 2023-12-10 (×4): qty 5

## 2023-12-10 MED ORDER — PREDNISONE 20 MG PO TABS
40.0000 mg | ORAL_TABLET | Freq: Every day | ORAL | Status: DC
  Administered 2023-12-12 – 2023-12-14 (×3): 40 mg via ORAL
  Filled 2023-12-10 (×3): qty 2

## 2023-12-10 MED ORDER — FINERENONE 20 MG PO TABS: 1.0000 | ORAL_TABLET | Freq: Every day | ORAL | Status: DC

## 2023-12-10 MED ORDER — EMPAGLIFLOZIN 25 MG PO TABS
25.0000 mg | ORAL_TABLET | Freq: Every day | ORAL | Status: DC
  Filled 2023-12-10: qty 1

## 2023-12-10 MED ORDER — LITHIUM CARBONATE 150 MG PO CAPS
150.0000 mg | ORAL_CAPSULE | Freq: Every day | ORAL | Status: DC
  Administered 2023-12-11 – 2023-12-14 (×4): 150 mg via ORAL
  Filled 2023-12-10 (×5): qty 1

## 2023-12-10 MED ORDER — ACETAMINOPHEN 650 MG RE SUPP: 650.0000 mg | Freq: Four times a day (QID) | RECTAL | Status: DC | PRN

## 2023-12-10 MED ORDER — LEVOTHYROXINE SODIUM 112 MCG PO TABS
112.0000 ug | ORAL_TABLET | Freq: Every day | ORAL | Status: DC
  Administered 2023-12-11 – 2023-12-14 (×4): 112 ug via ORAL
  Filled 2023-12-10 (×4): qty 1

## 2023-12-10 MED ORDER — FERROUS SULFATE 325 (65 FE) MG PO TABS
325.0000 mg | ORAL_TABLET | Freq: Every day | ORAL | Status: DC
  Administered 2023-12-11 – 2023-12-14 (×4): 325 mg via ORAL
  Filled 2023-12-10 (×4): qty 1

## 2023-12-10 NOTE — ED Notes (Signed)
 Transport paged @ this time to transfer pt to in-patient unit assigned

## 2023-12-10 NOTE — ED Triage Notes (Signed)
 BIB ems from [facility] for [facility], dx with the flu last week. Has been feeling more short of breath today. Normally on 2L [place] since flu has been on 3 to 4L [place]. Today O2 was in 70s/80s. Was given a breathing tx by facility and one by ems pta.  Pt currently 94% on 3L [place], A&O to baseline

## 2023-12-10 NOTE — ED Notes (Signed)
 Oxygen increased to 3.5L [place] by provider at bedside

## 2023-12-10 NOTE — Progress Notes (Signed)
 VBG results called to ED RN pH:  7.37 CO2: 63 HCO3: 36.4 VENOUS PO2: 23

## 2023-12-10 NOTE — H&P (Incomplete)
 [facility]   PATIENT NAME: Tammy [name]    MR#:  [id number]  DATE OF BIRTH:  01/24/1961  DATE OF ADMISSION:  12/10/2023  PRIMARY CARE PHYSICIAN: Armando Gang, FNP   Patient is coming from: Home  REQUESTING/REFERRING PHYSICIAN: Willy Eddy, MD  CHIEF COMPLAINT:   Chief Complaint  Patient presents with   Shortness of Breath    HISTORY OF PRESENT ILLNESS:  GAYNEL SCHAAFSMA is a 63 y.o. Caucasian female with medical history significant for anxiety, dementia, asthma, bipolar 1 disorder, type 2 diabetes mellitus, CHF, COPD on home O2 at 2 L/min, and seizure disorder, presented to the emergency room with acute onset of worsening dyspnea with associated cough and wheezing over the last week.  She was diagnosed with influenza on Monday.  She admits to diarrhea without nausea or vomiting or abdominal pain.  No fever or chills.  No dysuria, oliguria or hematuria or flank pain.  She denies any chest pain or palpitations.  No bleeding diathesis. She denies any chest pain or palpitations.  No bleeding diathesis.  ED Course: When she came to the ER, respiratory rate was 22 and pulse oximetry 94% on 3 L of O2 by nasal cannula with otherwise unremarkable vital signs.  Labs revealed venous blood gas with pH 7.37 and pCO2 63 with pO2 less than 31 and HCO3 36.4.  CMP revealed blood glucose of 156 and albumin 3.3 with total protein 6.  High sensitive troponin I was 19 and later 20.  CBC was within normal. EKG as reviewed by me : EKG showed normal sinus rhythm with rate of 87. Imaging: Portable chest x-ray showed cardiomegaly with pulmonary vascular congestion.  The patient was given 2 DuoNebs and 125 mg of IV Solu-Medrol.  She will be admitted to a medical telemetry bed for further evaluation and management. PAST MEDICAL HISTORY:   Past Medical History:  Diagnosis Date   Anxiety    Asthma    Bipolar 1 disorder (HCC)    CHF (congestive heart failure) (HCC)    COPD (chronic  obstructive pulmonary disease) (HCC)    Dementia (HCC)    Diabetes mellitus without complication (HCC)    Epilepsy (HCC)    Seizures (HCC)     PAST SURGICAL HISTORY:   Past Surgical History:  Procedure Laterality Date   APPENDECTOMY     hernia reapir     x 3      SOCIAL HISTORY:   Social History   Tobacco Use   Smoking status: Former    Current packs/day: 0.00    Types: Cigarettes    Quit date: 10/07/1998    Years since quitting: 25.1   Smokeless tobacco: Never  Substance Use Topics   Alcohol use: No    FAMILY HISTORY:   Family History  Problem Relation Age of Onset   Hypertension Mother    Kidney cancer Neg Hx    Bladder Cancer Neg Hx    Breast cancer Neg Hx     DRUG ALLERGIES:   Allergies  Allergen Reactions   Penicillins Rash    Has patient had a PCN reaction causing immediate rash, facial/tongue/throat swelling, SOB or lightheadedness with hypotension: No Has patient had a PCN reaction causing severe rash involving mucus membranes or skin necrosis: No Has patient had a PCN reaction that required hospitalization: No Has patient had a PCN reaction occurring within the last 10 years: No If all of the above answers are "NO", then may proceed  with Cephalosporin use.    Prednisone Rash    REVIEW OF SYSTEMS:   ROS As per history of present illness. All pertinent systems were reviewed above. Constitutional, HEENT, cardiovascular, respiratory, GI, GU, musculoskeletal, neuro, psychiatric, endocrine, integumentary and hematologic systems were reviewed and are otherwise negative/unremarkable except for positive findings mentioned above in the HPI.   MEDICATIONS AT HOME:   Prior to Admission medications   Medication Sig Start Date End Date Taking? Authorizing Provider  albuterol (VENTOLIN HFA) 108 (90 Base) MCG/ACT inhaler Inhale 1-2 puffs into the lungs every 6 (six) hours as needed for wheezing or shortness of breath.    [provider]   aluminum-magnesium hydroxide-simethicone (MAALOX) 200-200-20 MG/5ML SUSP Take 30 mLs by mouth 4 (four) times daily -  before meals and at bedtime. 09/07/23   Sharman Cheek, MD  busPIRone (BUSPAR) 30 MG tablet Take 30 mg by mouth 2 (two) times daily.     [provider]  carvedilol (COREG) 3.125 MG tablet Take 3.125 mg by mouth 2 (two) times daily with a meal.     [provider]  cetirizine (ZYRTEC) 10 MG tablet Take 10 mg by mouth daily.    [provider]  Cyanocobalamin 1000 MCG SUBL Place 1 tablet (1,000 mcg total) under the tongue daily. 07/01/19   Earna Coder, MD  doxycycline (VIBRAMYCIN) 100 MG capsule Take 1 capsule (100 mg total) by mouth 2 (two) times daily for 10 days. 12/04/23 12/14/23  Sharman Cheek, MD  empagliflozin (JARDIANCE) 25 MG TABS tablet Take 25 mg by mouth daily.     [provider]  famotidine (PEPCID) 20 MG tablet Take 1 tablet (20 mg total) by mouth 2 (two) times daily. 09/07/23   Sharman Cheek, MD  ferrous sulfate 325 (65 FE) MG EC tablet Take 1 tablet (325 mg total) by mouth daily with breakfast. 08/20/19   Earna Coder, MD  fluticasone (FLONASE) 50 MCG/ACT nasal spray Place 1 spray into both nostrils daily.     [provider]  KERENDIA 20 MG TABS Take 1 tablet by mouth daily.    [provider]  lamoTRIgine (LAMICTAL) 150 MG tablet Take 150 mg by mouth 2 (two) times daily.    [provider]  levETIRAcetam (KEPPRA) 250 MG tablet Take 250 mg by mouth 2 (two) times daily.    [provider]  levothyroxine (SYNTHROID) 112 MCG tablet Take 112 mcg by mouth daily before breakfast.    [provider]  lithium carbonate 150 MG capsule Take 150 mg by mouth daily.     [provider]  metFORMIN (GLUCOPHAGE) 500 MG tablet Take 500 mg by mouth 2 (two) times daily with a meal.    [provider]  methylPREDNISolone (MEDROL DOSEPAK) 4 MG TBPK tablet Take taper  according to package directions 12/05/23   Sharman Cheek, MD  nicotine (NICODERM CQ - DOSED IN MG/24 HOURS) 21 mg/24hr patch Place 1 patch (21 mg total) onto the skin daily. 08/17/23   Jonah Blue, MD  Omega-3 Fatty Acids (FISH OIL) 1000 MG CAPS Take 1,000 mg by mouth 2 (two) times daily.     [provider]  ondansetron (ZOFRAN) 4 MG tablet Take 1 tablet (4 mg total) by mouth every 8 (eight) hours as needed for nausea or vomiting. 09/06/23   Pilar Jarvis, MD  pravastatin (PRAVACHOL) 40 MG tablet Take 40 mg by mouth daily.    [provider]  risperiDONE (RISPERDAL) 3 MG tablet Take  3 mg by mouth at bedtime.     [provider]  risperiDONE ER (UZEDY) 75 MG/0.21ML SUSY Inject 75 mg into the skin every 30 (thirty) days.    [provider]  rOPINIRole (REQUIP) 1 MG tablet Take 1 mg by mouth at bedtime.    [provider]  TRELEGY ELLIPTA 100-62.5-25 MCG/INH AEPB Inhale 1 puff into the lungs daily.  11/17/18   [provider]  Vitamin D, Ergocalciferol, (DRISDOL) 1.25 MG (50000 UT) CAPS capsule Take 50,000 Units by mouth every 7 (seven) days.    [provider]      VITAL SIGNS:  Blood pressure 136/74, pulse 96, temperature 98.1 F (36.7 C), temperature source Oral, resp. rate 20, height 5\' 2"  (1.575 m), weight 58.8 kg, SpO2 95%.  PHYSICAL EXAMINATION:  Physical Exam  GENERAL:  63 y.o.-year-old Caucasian female patient lying in the bed with minimal respiratory distress with conversational dyspnea. EYES: Pupils equal, round, reactive to light and accommodation. No scleral icterus. Extraocular muscles intact.  HEENT: Head atraumatic, normocephalic. Oropharynx and nasopharynx clear.  NECK:  Supple, no jugular venous distention. No thyroid enlargement, no tenderness.  LUNGS: Diffuse expiratory wheezes with tight expiratory airflow and harsh vesicular breathing.  No use of accessory muscles of respiration.  CARDIOVASCULAR: Regular  rate and rhythm, S1, S2 normal. No murmurs, rubs, or gallops.  ABDOMEN: Soft, nondistended, nontender. Bowel sounds present. No organomegaly or mass.  EXTREMITIES: No pedal edema, cyanosis, or clubbing.  NEUROLOGIC: Cranial nerves II through XII are intact. Muscle strength 5/5 in all extremities. Sensation intact. Gait not checked.  PSYCHIATRIC: The patient is alert and oriented x 3.  Normal affect and good eye contact. SKIN: No obvious rash, lesion, or ulcer.   LABORATORY PANEL:   CBC Recent Labs  Lab 12/10/23 2051  WBC 6.8  HGB 13.7  HCT 43.9  PLT 271   ------------------------------------------------------------------------------------------------------------------  Chemistries  Recent Labs  Lab 12/10/23 2051  NA 141  K 4.4  CL 104  CO2 30  GLUCOSE 156*  BUN 15  CREATININE 1.05*  CALCIUM 9.2  AST 20  ALT 25  ALKPHOS 80  BILITOT 0.7   ------------------------------------------------------------------------------------------------------------------  Cardiac Enzymes No results for input(s): "TROPONINI" in the last 168 hours. ------------------------------------------------------------------------------------------------------------------  RADIOLOGY:  DG Chest Portable 1 View Result Date: 12/10/2023 CLINICAL DATA:  Shortness of breath EXAM: PORTABLE CHEST 1 VIEW COMPARISON:  12/04/2023 FINDINGS: Stable cardiomegaly. Aortic atherosclerotic calcification. Pulmonary vascular congestion. No focal consolidation, pleural effusion, or pneumothorax. No displaced rib fractures. IMPRESSION: Cardiomegaly with pulmonary vascular congestion. Electronically Signed   By: Minerva Fester M.D.   On: 12/10/2023 23:14      IMPRESSION AND PLAN:  Assessment and Plan: * COPD exacerbation (HCC) - The patient will be admitted to a medically monitored bed. - We will place the patient IV steroid therapy with IV Solu-Medrol as well as nebulized bronchodilator therapy with duonebs q.i.d. and  q.4 hours p.r.n.Marland Kitchen - Mucolytic therapy will be provided with Mucinex and antibiotic therapy with IV Rocephin. - O2 protocol will be followed.   Acute on chronic respiratory failure (HCC) - This is clearly secondary to #1. - This is associated with hypoxia and hypercarbia. - O2 protocol will be followed.  Type 2 diabetes mellitus with chronic kidney disease, with long-term current use of insulin (HCC) - We will place the patient on supplemental coverage with NovoLog. - We will hold off metformin. - We will continue Jardiance.  GERD without esophagitis - We will continue  H2 blocker therapy.  Anxiety - We will continue BuSpar.  Dyslipidemia - We will continue statin therapy.  Essential hypertension - We will continue antihypertensive therapy.  Hypothyroidism - We will continue Synthroid.   DVT prophylaxis: Lovenox.  Advanced Care Planning:  Code Status: full code.  Family Communication:  The plan of care was discussed in details with the patient (and family). I answered all questions. The patient agreed to proceed with the above mentioned plan. Further management will depend upon hospital course. Disposition Plan: Back to previous home environment Consults called: none.  All the records are reviewed and case discussed with ED provider.  Status is: Inpatient  At the time of the admission, it appears that the appropriate admission status for this patient is inpatient.  This is judged to be reasonable and necessary in order to provide the required intensity of service to ensure the patient's safety given the presenting symptoms, physical exam findings and initial radiographic and laboratory data in the context of comorbid conditions.  The patient requires inpatient status due to high intensity of service, high risk of further deterioration and high frequency of surveillance required.  I certify that at the time of admission, it is my clinical judgment that the patient will require  inpatient hospital care extending more than 2 midnights.                            Dispo: The patient is from: Home              Anticipated d/c is to: Home              Patient currently is not medically stable to d/c.              Difficult to place patient: No  Hannah Beat M.D on 12/11/2023 at 4:42 AM  [facility]   From 7 PM-7 AM, contact night-coverage [email or web address]  CC: Primary care physician; Armando Gang, FNP

## 2023-12-10 NOTE — ED Provider Notes (Addendum)
 [facility] Provider Note    Event Date/Time   First MD Initiated Contact with Patient 12/10/23 2028     (approximate)   History   Shortness of Breath   HPI  Tammy [name] is a 63 y.o. female with recent diagnosis of flu presents to the ER for evaluation of shortness of breath cough congestion.  Patient reportedly has been having to wear 3 to 4 L nasal cannula due to hypoxia after her flu diagnosis.  Feels like she is wheezing quite a bit.  Went to dinner today and started having worsening shortness of breath called EMS.  Was found to be hypoxic to low 80s on her home oxygen was given nebulizer treatment and route with some improvement.     Physical Exam   Triage Vital Signs: ED Triage Vitals  Encounter Vitals Group     BP 12/10/23 2036 124/70     Systolic BP Percentile --      Diastolic BP Percentile --      Pulse Rate 12/10/23 2036 91     Resp 12/10/23 2036 (!) 22     Temp 12/10/23 2039 98.4 F (36.9 C)     Temp Source 12/10/23 2039 Oral     SpO2 12/10/23 2034 93 %     Weight 12/10/23 2038 132 lb 14.4 oz (60.3 kg)     Height 12/10/23 2036 5\' 2"  (1.575 m)     Head Circumference --      Peak Flow --      Pain Score --      Pain Loc --      Pain Education --      Exclude from Growth Chart --     Most recent vital signs: Vitals:   12/10/23 2039 12/10/23 2104  BP:    Pulse:    Resp:    Temp: 98.4 F (36.9 C)   SpO2:  94%     Constitutional: Alert  Eyes: Conjunctivae are normal.  Head: Atraumatic. Nose: No congestion/rhinnorhea. Mouth/Throat: Mucous membranes are moist.   Neck: Painless ROM.  Cardiovascular:   Good peripheral circulation. Respiratory: Mild tachypnea, speaking in complete phrases.  Diminished breath sounds with occasional scattered wheeze auscultated. Gastrointestinal: Soft and nontender.  Musculoskeletal:  no deformity Neurologic:  MAE spontaneously. No gross focal neurologic deficits are appreciated.  Skin:   Skin is warm, dry and intact. No rash noted. Psychiatric: Mood and affect are normal. Speech and behavior are normal.    ED Results / Procedures / Treatments   Labs (all labs ordered are listed, but only abnormal results are displayed) Labs Reviewed  COMPREHENSIVE METABOLIC PANEL - Abnormal; Notable for the following components:      Result Value   Glucose, Bld 156 (*)    Creatinine, Ser 1.05 (*)    Total Protein 6.0 (*)    Albumin 3.3 (*)    All other components within normal limits  BLOOD GAS, VENOUS - Abnormal; Notable for the following components:   pCO2, Ven 63 (*)    pO2, Ven <31 (*)    Bicarbonate 36.4 (*)    Acid-Base Excess 8.7 (*)    All other components within normal limits  TROPONIN I (HIGH SENSITIVITY) - Abnormal; Notable for the following components:   Troponin I (High Sensitivity) 19 (*)    All other components within normal limits  CBC WITH DIFFERENTIAL/PLATELET  BRAIN NATRIURETIC PEPTIDE  TROPONIN I (HIGH SENSITIVITY)     EKG  ED ECG  REPORT I, Willy Eddy, the attending physician, personally viewed and interpreted this ECG.   Date: 12/10/2023  EKG Time: 20:47  Rate: 90  Rhythm: sinus  Axis: normal  Intervals: normal  ST&T Change: no stemi, no depressions    RADIOLOGY Please see ED Course for my review and interpretation.  I personally reviewed all radiographic images ordered to evaluate for the above acute complaints and reviewed radiology reports and findings.  These findings were personally discussed with the patient.  Please see medical record for radiology report.    PROCEDURES:  Critical Care performed: no  Procedures   MEDICATIONS ORDERED IN ED: Medications  ipratropium-albuterol (DUONEB) 0.5-2.5 (3) MG/3ML nebulizer solution 3 mL (3 mLs Nebulization Given 12/10/23 2111)  ipratropium-albuterol (DUONEB) 0.5-2.5 (3) MG/3ML nebulizer solution 3 mL (3 mLs Nebulization Given 12/10/23 2153)  methylPREDNISolone sodium succinate  (SOLU-MEDROL) 125 mg/2 mL injection 125 mg (125 mg Intravenous Given 12/10/23 2153)     IMPRESSION / MDM / ASSESSMENT AND PLAN / ED COURSE  I reviewed the triage vital signs and the nursing notes.                              Differential diagnosis includes, but is not limited to, Asthma, copd, CHF, pna, ptx, malignancy, Pe, anemia  Patient presenting to the ER for evaluation of symptoms as described above.  Based on symptoms, risk factors and considered above differential, this presenting complaint could reflect a potentially life-threatening illness therefore the patient will be placed on continuous pulse oximetry and telemetry for monitoring.  Laboratory evaluation will be sent to evaluate for the above complaints.  Clinically I suspect COPD exacerbation.  I have a low suspicion PE.  Will workup for possible sepsis or pneumonia.  Chest x-ray on my review and interpretation does not show any evidence of dense consolidation or large effusion.  Patient did have some improvement with nebulizer.  Will check VBG.   Clinical Course as of 12/10/23 2210  Wed Dec 10, 2023  2142 Patient is mildly hypercapnic which I suspect is secondary to her increased use of oxygen after a flu illness.  Have turned about down to 2 L nasal cannula will give additional nebulizer treatments as I do think she is having acute COPD exacerbation. [PR]  2210 Case discussed in consultation with hospitalist for admission. [PR]    Clinical Course User Index [PR] Willy Eddy, MD     FINAL CLINICAL IMPRESSION(S) / ED DIAGNOSES   Final diagnoses:  COPD exacerbation (HCC)     Rx / DC Orders   ED Discharge Orders     None        Note:  This document was prepared using Dragon voice recognition software and may include unintentional dictation errors.    Willy Eddy, MD 12/10/23 1610    Willy Eddy, MD 12/10/23 [phone]

## 2023-12-11 DIAGNOSIS — E1122 Type 2 diabetes mellitus with diabetic chronic kidney disease: Secondary | ICD-10-CM

## 2023-12-11 DIAGNOSIS — F419 Anxiety disorder, unspecified: Secondary | ICD-10-CM | POA: Insufficient documentation

## 2023-12-11 DIAGNOSIS — K219 Gastro-esophageal reflux disease without esophagitis: Secondary | ICD-10-CM | POA: Insufficient documentation

## 2023-12-11 DIAGNOSIS — J441 Chronic obstructive pulmonary disease with (acute) exacerbation: Secondary | ICD-10-CM | POA: Diagnosis not present

## 2023-12-11 LAB — GLUCOSE, CAPILLARY
Glucose-Capillary: 219 mg/dL — ABNORMAL HIGH (ref 70–99)
Glucose-Capillary: 136 mg/dL — ABNORMAL HIGH (ref 70–99)
Glucose-Capillary: 146 mg/dL — ABNORMAL HIGH (ref 70–99)
Glucose-Capillary: 142 mg/dL — ABNORMAL HIGH (ref 70–99)

## 2023-12-11 LAB — BASIC METABOLIC PANEL
Anion gap: 12 (ref 5–15)
BUN: 12 mg/dL (ref 8–23)
CO2: 24 mmol/L (ref 22–32)
Calcium: 8.9 mg/dL (ref 8.9–10.3)
Chloride: 98 mmol/L (ref 98–111)
Creatinine, Ser: 0.89 mg/dL (ref 0.44–1.00)
GFR, Estimated: >60 mL/min (ref >60)
Glucose, Bld: 257 mg/dL — ABNORMAL HIGH (ref 70–99)
Potassium: 4.5 mmol/L (ref 3.5–5.1)
Sodium: 134 mmol/L — ABNORMAL LOW (ref 135–145)

## 2023-12-11 LAB — CBC
HCT: 38.5 % (ref 36.0–46.0)
Hemoglobin: 12.4 g/dL (ref 12.0–15.0)
MCH: 30.7 pg (ref 26.0–34.0)
MCHC: 32.2 g/dL (ref 30.0–36.0)
MCV: 95.3 fL (ref 80.0–100.0)
Platelets: 251 10*3/uL (ref 150–400)
RBC: 4.04 MIL/uL (ref 3.87–5.11)
RDW: 14.2 % (ref 11.5–15.5)
WBC: 9.7 10*3/uL (ref 4.0–10.5)
nRBC: 0 % (ref 0.0–0.2)

## 2023-12-11 LAB — TROPONIN I (HIGH SENSITIVITY): Troponin I (High Sensitivity): 20 ng/L — ABNORMAL HIGH (ref <18)

## 2023-12-11 MED ORDER — IPRATROPIUM-ALBUTEROL 0.5-2.5 (3) MG/3ML IN SOLN: 3.0000 mL | RESPIRATORY_TRACT | Status: DC | PRN

## 2023-12-11 MED ORDER — ORAL CARE MOUTH RINSE: 15.0000 mL | OROMUCOSAL | Status: DC | PRN

## 2023-12-11 MED ORDER — HALOPERIDOL LACTATE 5 MG/ML IJ SOLN: 5.0000 mg | Freq: Four times a day (QID) | INTRAMUSCULAR | Status: DC | PRN

## 2023-12-11 MED ORDER — INSULIN ASPART 100 UNIT/ML IJ SOLN
0.0000 [IU] | Freq: Three times a day (TID) | INTRAMUSCULAR | Status: DC
  Administered 2023-12-11: 5 [IU] via SUBCUTANEOUS
  Administered 2023-12-11 (×2): 2 [IU] via SUBCUTANEOUS
  Administered 2023-12-12: 5 [IU] via SUBCUTANEOUS
  Administered 2023-12-12 – 2023-12-13 (×2): 3 [IU] via SUBCUTANEOUS
  Administered 2023-12-13: 5 [IU] via SUBCUTANEOUS
  Administered 2023-12-14: 11 [IU] via SUBCUTANEOUS
  Filled 2023-12-11 (×8): qty 1

## 2023-12-11 MED ORDER — LORAZEPAM 2 MG/ML IJ SOLN
0.5000 mg | Freq: Once | INTRAMUSCULAR | Status: AC
  Administered 2023-12-11: 0.5 mg via INTRAVENOUS
  Filled 2023-12-11: qty 1

## 2023-12-11 MED ORDER — FUROSEMIDE 10 MG/ML IJ SOLN
20.0000 mg | Freq: Once | INTRAMUSCULAR | Status: AC
  Administered 2023-12-11: 20 mg via INTRAVENOUS
  Filled 2023-12-11: qty 2

## 2023-12-11 MED ORDER — ENSURE ENLIVE PO LIQD
237.0000 mL | Freq: Two times a day (BID) | ORAL | Status: DC
  Administered 2023-12-11 – 2023-12-14 (×7): 237 mL via ORAL

## 2023-12-11 MED ORDER — SODIUM CHLORIDE 0.9 % IV SOLN
500.0000 mg | INTRAVENOUS | Status: DC
  Administered 2023-12-11 – 2023-12-12 (×2): 500 mg via INTRAVENOUS
  Filled 2023-12-11 (×3): qty 5

## 2023-12-11 MED ORDER — INSULIN ASPART 100 UNIT/ML IJ SOLN
0.0000 [IU] | Freq: Every day | INTRAMUSCULAR | Status: DC
  Administered 2023-12-12 – 2023-12-13 (×2): 2 [IU] via SUBCUTANEOUS
  Filled 2023-12-11 (×2): qty 1

## 2023-12-11 NOTE — Assessment & Plan Note (Addendum)
-   This is clearly secondary to #1. - This is associated with hypoxia and hypercarbia. - O2 protocol will be followed.

## 2023-12-11 NOTE — Assessment & Plan Note (Signed)
 -  We will continue statin therapy.

## 2023-12-11 NOTE — Plan of Care (Signed)
  Problem: Activity: Goal: Risk for activity intolerance will decrease Outcome: Progressing   Problem: Safety: Goal: Ability to remain free from injury will improve Outcome: Progressing   Problem: Skin Integrity: Goal: Risk for impaired skin integrity will decrease Outcome: Progressing   Problem: Activity: Goal: Ability to tolerate increased activity will improve Outcome: Progressing Goal: Will verbalize the importance of balancing activity with adequate rest periods Outcome: Progressing

## 2023-12-11 NOTE — Assessment & Plan Note (Signed)
 -  We will continue H2 blocker therapy.

## 2023-12-11 NOTE — Progress Notes (Signed)
 PROGRESS NOTE    Tammy [name]  YNW:[id number] DOB: 1961-02-16 DOA: 12/10/2023 PCP: Armando Gang, FNP   Assessment & Plan:   Principal Problem:   COPD exacerbation (HCC) Active Problems:   Acute on chronic respiratory failure (HCC)   Hypothyroidism   Essential hypertension   Dyslipidemia   Anxiety   GERD without esophagitis   Type 2 diabetes mellitus with chronic kidney disease, with long-term current use of insulin (HCC)  Assessment and Plan:  COPD exacerbation: continue on azithromycin, steroids, bronchodilators & encourage incentive spirometry. Likely secondary to influenza dx on 12/04/23  Influenza:continue w/ supportive care. Droplet precautions   Dementia: continue w/ supportive care   Acute on chronic respiratory failure: likely secondary to COPD exacerbation. Continue on supplemental oxygen and wean back to baseline as tolerated   DM2: likely poorly controlled. Continue on SSI w/ accuchecks   GERD: continue on famotidine    Anxiety: severity unknown. Continue on home dose of buspar   HLD: continue on statin    HTN: continue on coreg   Hypothyroidism: continue on levothyroxine         DVT prophylaxis: lovenox Code Status: full  Family Communication:  Disposition Plan: likely back to home facility   Level of care: Telemetry Medical  Status is: Inpatient Remains inpatient appropriate because: severity of illness    Consultants:    Procedures:   Antimicrobials: azithromycin    Subjective: Pt c/o shortness of breath   Objective: Vitals:   12/10/23 2329 12/11/23 0407 12/11/23 0805 12/11/23 0810  BP: 114/66 136/74  123/77  Pulse: 95 96  98  Resp: 20 20  19   Temp: 99 F (37.2 C) 98.1 F (36.7 C)  97.7 F (36.5 C)  TempSrc: Oral Oral    SpO2: 93% 95% 94% 94%  Weight: 58.8 kg     Height: 5\' 2"  (1.575 m)       Intake/Output Summary (Last 24 hours) at 12/11/2023 0841 Last data filed at 12/11/2023 0400 Gross per 24 hour  Intake 720  ml  Output --  Net 720 ml   Filed Weights   12/10/23 2038 12/10/23 2329  Weight: 60.3 kg 58.8 kg    Examination:  General exam: Appears calm and comfortable  Respiratory system: course breath sounds b/l. Cardiovascular system: S1 & S2+. No rubs, gallops or clicks. Gastrointestinal system: Abdomen is nondistended, soft and nontender. Normal bowel sounds heard. Central nervous system: Alert and awake. Moves all extremities  Psychiatry: Judgement and insight appears poor. Flat mood and affect     Data Reviewed: I have personally reviewed following labs and imaging studies  CBC: Recent Labs  Lab 12/04/23 0913 12/10/23 2051 12/11/23 0455  WBC 5.1 6.8 9.7  NEUTROABS 3.6 5.0  --   HGB 12.7 13.7 12.4  HCT 40.3 43.9 38.5  MCV 96.2 96.7 95.3  PLT 124* 271 251   Basic Metabolic Panel: Recent Labs  Lab 12/04/23 0913 12/10/23 2051 12/11/23 0455  NA 136 141 134*  K 4.1 4.4 4.5  CL 107 104 98  CO2 23 30 24   GLUCOSE 122* 156* 257*  BUN 18 15 12   CREATININE 1.00 1.05* 0.89  CALCIUM 8.6* 9.2 8.9   GFR: Estimated Creatinine Clearance: 51.8 mL/min (by C-G formula based on SCr of 0.89 mg/dL). Liver Function Tests: Recent Labs  Lab 12/10/23 2051  AST 20  ALT 25  ALKPHOS 80  BILITOT 0.7  PROT 6.0*  ALBUMIN 3.3*   No results for input(s): "LIPASE", "AMYLASE"  in the last 168 hours. No results for input(s): "AMMONIA" in the last 168 hours. Coagulation Profile: No results for input(s): "INR", "PROTIME" in the last 168 hours. Cardiac Enzymes: No results for input(s): "CKTOTAL", "CKMB", "CKMBINDEX", "TROPONINI" in the last 168 hours. BNP (last 3 results) No results for input(s): "PROBNP" in the last 8760 hours. HbA1C: No results for input(s): "HGBA1C" in the last 72 hours. CBG: Recent Labs  Lab 12/11/23 0730  GLUCAP 219*   Lipid Profile: No results for input(s): "CHOL", "HDL", "LDLCALC", "TRIG", "CHOLHDL", "LDLDIRECT" in the last 72 hours. Thyroid Function  Tests: No results for input(s): "TSH", "T4TOTAL", "FREET4", "T3FREE", "THYROIDAB" in the last 72 hours. Anemia Panel: No results for input(s): "VITAMINB12", "FOLATE", "FERRITIN", "TIBC", "IRON", "RETICCTPCT" in the last 72 hours. Sepsis Labs: No results for input(s): "PROCALCITON", "LATICACIDVEN" in the last 168 hours.  Recent Results (from the past 240 hours)  Resp panel by RT-PCR (RSV, Flu A&B, Covid) Anterior Nasal Swab     Status: Abnormal   Collection Time: 12/04/23  9:13 AM   Specimen: Anterior Nasal Swab  Result Value Ref Range Status   SARS Coronavirus 2 by RT PCR NEGATIVE NEGATIVE Final    Comment: (NOTE) SARS-CoV-2 target nucleic acids are NOT DETECTED.  The SARS-CoV-2 RNA is generally detectable in upper respiratory specimens during the acute phase of infection. The lowest concentration of SARS-CoV-2 viral copies this assay can detect is 138 copies/mL. A negative result does not preclude SARS-Cov-2 infection and should not be used as the sole basis for treatment or other patient management decisions. A negative result may occur with  improper specimen collection/handling, submission of specimen other than nasopharyngeal swab, presence of viral mutation(s) within the areas targeted by this assay, and inadequate number of viral copies(<138 copies/mL). A negative result must be combined with clinical observations, patient history, and epidemiological information. The expected result is Negative.  Fact Sheet for Patients:  BloggerCourse.com  Fact Sheet for Healthcare Providers:  SeriousBroker.it  This test is no t yet approved or cleared by the [place] FDA and  has been authorized for detection and/or diagnosis of SARS-CoV-2 by FDA under an Emergency Use Authorization (EUA). This EUA will remain  in effect (meaning this test can be used) for the duration of the COVID-19 declaration under Section 564(b)(1) of the Act,  21 U.S.C.section 360bbb-3(b)(1), unless the authorization is terminated  or revoked sooner.       Influenza A by PCR POSITIVE (A) NEGATIVE Final   Influenza B by PCR NEGATIVE NEGATIVE Final    Comment: (NOTE) The Xpert Xpress SARS-CoV-2/FLU/RSV plus assay is intended as an aid in the diagnosis of influenza from Nasopharyngeal swab specimens and should not be used as a sole basis for treatment. Nasal washings and aspirates are unacceptable for Xpert Xpress SARS-CoV-2/FLU/RSV testing.  Fact Sheet for Patients: BloggerCourse.com  Fact Sheet for Healthcare Providers: SeriousBroker.it  This test is not yet approved or cleared by the [place] FDA and has been authorized for detection and/or diagnosis of SARS-CoV-2 by FDA under an Emergency Use Authorization (EUA). This EUA will remain in effect (meaning this test can be used) for the duration of the COVID-19 declaration under Section 564(b)(1) of the Act, 21 U.S.C. section 360bbb-3(b)(1), unless the authorization is terminated or revoked.     Resp Syncytial Virus by PCR NEGATIVE NEGATIVE Final    Comment: (NOTE) Fact Sheet for Patients: BloggerCourse.com  Fact Sheet for Healthcare Providers: SeriousBroker.it  This test is not yet approved or cleared  by the [place] and has been authorized for detection and/or diagnosis of SARS-CoV-2 by FDA under an Emergency Use Authorization (EUA). This EUA will remain in effect (meaning this test can be used) for the duration of the COVID-19 declaration under Section 564(b)(1) of the Act, 21 U.S.C. section 360bbb-3(b)(1), unless the authorization is terminated or revoked.  Performed at [facility], [address], [place], [place] [address]          Radiology Studies: DG Chest Portable 1 View Result Date: 12/10/2023 CLINICAL DATA:  Shortness of breath EXAM:  PORTABLE CHEST 1 VIEW COMPARISON:  12/04/2023 FINDINGS: Stable cardiomegaly. Aortic atherosclerotic calcification. Pulmonary vascular congestion. No focal consolidation, pleural effusion, or pneumothorax. No displaced rib fractures. IMPRESSION: Cardiomegaly with pulmonary vascular congestion. Electronically Signed   By: Minerva Fester M.D.   On: 12/10/2023 23:14        Scheduled Meds:  busPIRone  30 mg Oral BID   carvedilol  3.125 mg Oral BID WC   cyanocobalamin  1,000 mcg Oral Daily   empagliflozin  25 mg Oral Daily   enoxaparin (LOVENOX) injection  40 mg Subcutaneous Q24H   famotidine  20 mg Oral Daily   feeding supplement  237 mL Oral BID BM   ferrous sulfate  325 mg Oral Q breakfast   Finerenone  1 tablet Oral Daily   guaiFENesin  600 mg Oral BID   insulin aspart  0-15 Units Subcutaneous TID WC   insulin aspart  0-5 Units Subcutaneous QHS   ipratropium-albuterol  3 mL Nebulization QID   lamoTRIgine  150 mg Oral BID   levETIRAcetam  250 mg Oral BID   levothyroxine  112 mcg Oral Q0600   lithium carbonate  150 mg Oral Daily   loratadine  10 mg Oral Daily   methylPREDNISolone (SOLU-MEDROL) injection  40 mg Intravenous Q12H   Followed by   Melene Muller ON 12/12/2023] predniSONE  40 mg Oral Q breakfast   nicotine  21 mg Transdermal Daily   omega-3 acid ethyl esters  1 g Oral BID   pravastatin  40 mg Oral Daily   risperiDONE  3 mg Oral QHS   risperiDONE ER  75 mg Subcutaneous Q30 days   rOPINIRole  1 mg Oral QHS   Vitamin D (Ergocalciferol)  50,000 Units Oral Q7 days   Continuous Infusions:  cefTRIAXone (ROCEPHIN)  IV 1 g (12/11/23 0113)     LOS: 1 day      Charise Killian, MD [facility] Pager [phone]  If 7PM-7AM, please contact night-coverage [email or web address] 12/11/2023, 8:41 AM

## 2023-12-11 NOTE — Assessment & Plan Note (Signed)
 -  The patient will be admitted to a medically monitored bed. - We will place the patient IV steroid therapy with IV Solu-Medrol as well as nebulized bronchodilator therapy with duonebs q.i.d. and q.4 hours p.r.n.Marland Kitchen - Mucolytic therapy will be provided with Mucinex and antibiotic therapy with IV Rocephin. - O2 protocol will be followed.

## 2023-12-11 NOTE — Assessment & Plan Note (Signed)
 -  We will continue Synthroid.

## 2023-12-11 NOTE — Assessment & Plan Note (Signed)
-   We will place the patient on supplemental coverage with NovoLog. - We will hold off metformin. - We will continue Jardiance.

## 2023-12-11 NOTE — Assessment & Plan Note (Signed)
-   We will continue antihypertensive therapy.

## 2023-12-11 NOTE — Assessment & Plan Note (Signed)
-   We will continue BuSpar. ?

## 2023-12-12 DIAGNOSIS — J441 Chronic obstructive pulmonary disease with (acute) exacerbation: Secondary | ICD-10-CM | POA: Diagnosis not present

## 2023-12-12 LAB — GLUCOSE, CAPILLARY
Glucose-Capillary: 116 mg/dL — ABNORMAL HIGH (ref 70–99)
Glucose-Capillary: 167 mg/dL — ABNORMAL HIGH (ref 70–99)
Glucose-Capillary: 218 mg/dL — ABNORMAL HIGH (ref 70–99)
Glucose-Capillary: 211 mg/dL — ABNORMAL HIGH (ref 70–99)

## 2023-12-12 NOTE — Plan of Care (Signed)
  Problem: Health Behavior/Discharge Planning: Goal: Ability to manage health-related needs will improve Outcome: Progressing   Problem: Clinical Measurements: Goal: Ability to maintain clinical measurements within normal limits will improve Outcome: Progressing Goal: Will remain free from infection Outcome: Progressing Goal: Diagnostic test results will improve Outcome: Progressing Goal: Respiratory complications will improve Outcome: Progressing Goal: Cardiovascular complication will be avoided Outcome: Progressing   Problem: Activity: Goal: Risk for activity intolerance will decrease Outcome: Progressing   Problem: Nutrition: Goal: Adequate nutrition will be maintained Outcome: Progressing   Problem: Coping: Goal: Level of anxiety will decrease Outcome: Progressing   Problem: Elimination: Goal: Will not experience complications related to bowel motility Outcome: Progressing Goal: Will not experience complications related to urinary retention Outcome: Progressing   Problem: Pain Managment: Goal: General experience of comfort will improve and/or be controlled Outcome: Progressing   Problem: Safety: Goal: Ability to remain free from injury will improve Outcome: Progressing   Problem: Skin Integrity: Goal: Risk for impaired skin integrity will decrease Outcome: Progressing   Problem: Activity: Goal: Ability to tolerate increased activity will improve Outcome: Progressing Goal: Will verbalize the importance of balancing activity with adequate rest periods Outcome: Progressing   Problem: Respiratory: Goal: Ability to maintain a clear airway will improve Outcome: Progressing Goal: Levels of oxygenation will improve Outcome: Progressing Goal: Ability to maintain adequate ventilation will improve Outcome: Progressing   Problem: Fluid Volume: Goal: Ability to maintain a balanced intake and output will improve Outcome: Progressing   Problem: Health  Behavior/Discharge Planning: Goal: Ability to identify and utilize available resources and services will improve Outcome: Progressing Goal: Ability to manage health-related needs will improve Outcome: Progressing   Problem: Metabolic: Goal: Ability to maintain appropriate glucose levels will improve Outcome: Progressing   Problem: Nutritional: Goal: Maintenance of adequate nutrition will improve Outcome: Progressing Goal: Progress toward achieving an optimal weight will improve Outcome: Progressing   Problem: Skin Integrity: Goal: Risk for impaired skin integrity will decrease Outcome: Progressing   Problem: Tissue Perfusion: Goal: Adequacy of tissue perfusion will improve Outcome: Progressing

## 2023-12-12 NOTE — Progress Notes (Signed)
 SLP Cancellation Note  Patient Details Name: Tammy [name] MRN: [id number] DOB: 13-Apr-1961   Cancelled treatment:       Reason Eval/Treat Not Completed: Other (comment) (see ST note for details)  ST received consult for bedside swallow evaluation. Pt currently on regular diet with chart revealing  that pt discharged on dysphagia 1 with thin liquids and medicine crushed in puree at discharge on 08/2023. MD made aware, changed diet and will re-consult ST services should pt need re-assessment on dysphagia 1 with thin liquids.   Tammy [name], M.S., Tammy [name], Tammy [name] Speech-Language Pathologist Certified Brain Injury Specialist [facility] [phone] Ascom [phone] Fax [phone]  Tammy [name] 12/12/2023, 12:19 PM

## 2023-12-12 NOTE — Progress Notes (Signed)
 PROGRESS NOTE    Tammy [name] Page  GEX:[id number] DOB: [birth year] DOA: 12/10/2023 PCP: Armando Gang, FNP   Assessment & Plan:   Principal Problem:   COPD exacerbation (HCC) Active Problems:   Acute on chronic respiratory failure (HCC)   Hypothyroidism   Essential hypertension   Dyslipidemia   Anxiety   GERD without esophagitis   Type 2 diabetes mellitus with chronic kidney disease, with long-term current use of insulin (HCC)  Assessment and Plan: COPD exacerbation: continue on azithromycin, steroids, bronchodilators & encourage incentive spirometry. Likely secondary to influenza dx on 12/04/23   Influenza: continue w/ supportive care. Droplet precautions  Dementia: continue w/ supportive care  Dysphagia: likely secondary to dementia. Diet changed to dysphagia I as per speech (curbside)   Acute on chronic respiratory failure: likely secondary to COPD exacerbation. Continue on supplemental oxygen and wean back to baseline as tolerated   DM2: likely poorly controlled. Continue on SSI w/ accuchecks   GERD: continue on H2 blockers    Anxiety: severity unknown. Continue on home dose of buspar    HLD: continue on statin   HTN: continue on coreg    Hypothyroidism: continue on levothyroxine         DVT prophylaxis: lovenox Code Status: full  Family Communication:  Disposition Plan: likely back to home facility   Level of care: Telemetry Medical  Status is: Inpatient Remains inpatient appropriate because: severity of illness    Consultants:    Procedures:   Antimicrobials: azithromycin    Subjective: Pt c/o fatigue   Objective: Vitals:   12/11/23 1512 12/11/23 1531 12/11/23 1922 12/12/23 0742  BP: 136/80  127/77 (!) 153/74  Pulse: 86  72 91  Resp: 18  20 20   Temp: 99 F (37.2 C)  98.2 F (36.8 C) 97.8 F (36.6 C)  TempSrc:    Axillary  SpO2: 90% 94% 97% 99%  Weight:      Height:        Intake/Output Summary (Last 24 hours) at 12/12/2023  0838 Last data filed at 12/11/2023 2124 Gross per 24 hour  Intake 490.03 ml  Output --  Net 490.03 ml   Filed Weights   12/10/23 2038 12/10/23 2329  Weight: 60.3 kg 58.8 kg    Examination:  General exam: Appears comfortable  Respiratory system: course breath sounds b/l  Cardiovascular system: S1/S2+. No rubs or click Gastrointestinal system: abd is soft, NT, ND & normal bowel sounds Central nervous system: alert & awake. Moves all extremities   Psychiatry: Judgement and insight appears poor. Flat mood and affect     Data Reviewed: I have personally reviewed following labs and imaging studies  CBC: Recent Labs  Lab 12/10/23 2051 12/11/23 0455  WBC 6.8 9.7  NEUTROABS 5.0  --   HGB 13.7 12.4  HCT 43.9 38.5  MCV 96.7 95.3  PLT 271 251   Basic Metabolic Panel: Recent Labs  Lab 12/10/23 2051 12/11/23 0455  NA 141 134*  K 4.4 4.5  CL 104 98  CO2 30 24  GLUCOSE 156* 257*  BUN 15 12  CREATININE 1.05* 0.89  CALCIUM 9.2 8.9   GFR: Estimated Creatinine Clearance: 51.8 mL/min (by C-G formula based on SCr of 0.89 mg/dL). Liver Function Tests: Recent Labs  Lab 12/10/23 2051  AST 20  ALT 25  ALKPHOS 80  BILITOT 0.7  PROT 6.0*  ALBUMIN 3.3*   No results for input(s): "LIPASE", "AMYLASE" in the last 168 hours. No results for input(s): "AMMONIA"  in the last 168 hours. Coagulation Profile: No results for input(s): "INR", "PROTIME" in the last 168 hours. Cardiac Enzymes: No results for input(s): "CKTOTAL", "CKMB", "CKMBINDEX", "TROPONINI" in the last 168 hours. BNP (last 3 results) No results for input(s): "PROBNP" in the last 8760 hours. HbA1C: No results for input(s): "HGBA1C" in the last 72 hours. CBG: Recent Labs  Lab 12/11/23 0730 12/11/23 1111 12/11/23 1620 12/11/23 2102 12/12/23 0752  GLUCAP 219* 136* 146* 142* 116*   Lipid Profile: No results for input(s): "CHOL", "HDL", "LDLCALC", "TRIG", "CHOLHDL", "LDLDIRECT" in the last 72 hours. Thyroid  Function Tests: No results for input(s): "TSH", "T4TOTAL", "FREET4", "T3FREE", "THYROIDAB" in the last 72 hours. Anemia Panel: No results for input(s): "VITAMINB12", "FOLATE", "FERRITIN", "TIBC", "IRON", "RETICCTPCT" in the last 72 hours. Sepsis Labs: No results for input(s): "PROCALCITON", "LATICACIDVEN" in the last 168 hours.  Recent Results (from the past 240 hours)  Resp panel by RT-PCR (RSV, Flu A&B, Covid) Anterior Nasal Swab     Status: Abnormal   Collection Time: 12/04/23  9:13 AM   Specimen: Anterior Nasal Swab  Result Value Ref Range Status   SARS Coronavirus 2 by RT PCR NEGATIVE NEGATIVE Final    Comment: (NOTE) SARS-CoV-2 target nucleic acids are NOT DETECTED.  The SARS-CoV-2 RNA is generally detectable in upper respiratory specimens during the acute phase of infection. The lowest concentration of SARS-CoV-2 viral copies this assay can detect is 138 copies/mL. A negative result does not preclude SARS-Cov-2 infection and should not be used as the sole basis for treatment or other patient management decisions. A negative result may occur with  improper specimen collection/handling, submission of specimen other than nasopharyngeal swab, presence of viral mutation(s) within the areas targeted by this assay, and inadequate number of viral copies(<138 copies/mL). A negative result must be combined with clinical observations, patient history, and epidemiological information. The expected result is Negative.  Fact Sheet for Patients:  BloggerCourse.com  Fact Sheet for Healthcare Providers:  SeriousBroker.it  This test is no t yet approved or cleared by the [place] FDA and  has been authorized for detection and/or diagnosis of SARS-CoV-2 by FDA under an Emergency Use Authorization (EUA). This EUA will remain  in effect (meaning this test can be used) for the duration of the COVID-19 declaration under Section 564(b)(1) of  the Act, 21 U.S.C.section 360bbb-3(b)(1), unless the authorization is terminated  or revoked sooner.       Influenza A by PCR POSITIVE (A) NEGATIVE Final   Influenza B by PCR NEGATIVE NEGATIVE Final    Comment: (NOTE) The Xpert Xpress SARS-CoV-2/FLU/RSV plus assay is intended as an aid in the diagnosis of influenza from Nasopharyngeal swab specimens and should not be used as a sole basis for treatment. Nasal washings and aspirates are unacceptable for Xpert Xpress SARS-CoV-2/FLU/RSV testing.  Fact Sheet for Patients: BloggerCourse.com  Fact Sheet for Healthcare Providers: SeriousBroker.it  This test is not yet approved or cleared by the [place] FDA and has been authorized for detection and/or diagnosis of SARS-CoV-2 by FDA under an Emergency Use Authorization (EUA). This EUA will remain in effect (meaning this test can be used) for the duration of the COVID-19 declaration under Section 564(b)(1) of the Act, 21 U.S.C. section 360bbb-3(b)(1), unless the authorization is terminated or revoked.     Resp Syncytial Virus by PCR NEGATIVE NEGATIVE Final    Comment: (NOTE) Fact Sheet for Patients: BloggerCourse.com  Fact Sheet for Healthcare Providers: SeriousBroker.it  This test is not yet approved  or cleared by the [place] and has been authorized for detection and/or diagnosis of SARS-CoV-2 by FDA under an Emergency Use Authorization (EUA). This EUA will remain in effect (meaning this test can be used) for the duration of the COVID-19 declaration under Section 564(b)(1) of the Act, 21 U.S.C. section 360bbb-3(b)(1), unless the authorization is terminated or revoked.  Performed at [facility], [address] [place], [place] [address]          Radiology Studies: DG Chest Portable 1 View Result Date: 12/10/2023 CLINICAL DATA:  Shortness of breath  EXAM: PORTABLE CHEST 1 VIEW COMPARISON:  12/04/2023 FINDINGS: Stable cardiomegaly. Aortic atherosclerotic calcification. Pulmonary vascular congestion. No focal consolidation, pleural effusion, or pneumothorax. No displaced rib fractures. IMPRESSION: Cardiomegaly with pulmonary vascular congestion. Electronically Signed   By: Minerva Fester M.D.   On: 12/10/2023 23:14        Scheduled Meds:  busPIRone  30 mg Oral BID   carvedilol  3.125 mg Oral BID WC   cyanocobalamin  1,000 mcg Oral Daily   enoxaparin (LOVENOX) injection  40 mg Subcutaneous Q24H   famotidine  20 mg Oral Daily   feeding supplement  237 mL Oral BID BM   ferrous sulfate  325 mg Oral Q breakfast   Finerenone  1 tablet Oral Daily   guaiFENesin  600 mg Oral BID   insulin aspart  0-15 Units Subcutaneous TID WC   insulin aspart  0-5 Units Subcutaneous QHS   lamoTRIgine  150 mg Oral BID   levETIRAcetam  250 mg Oral BID   levothyroxine  112 mcg Oral Q0600   lithium carbonate  150 mg Oral Daily   loratadine  10 mg Oral Daily   nicotine  21 mg Transdermal Daily   omega-3 acid ethyl esters  1 g Oral BID   pravastatin  40 mg Oral Daily   predniSONE  40 mg Oral Q breakfast   risperiDONE  3 mg Oral QHS   risperiDONE ER  75 mg Subcutaneous Q30 days   rOPINIRole  1 mg Oral QHS   Vitamin D (Ergocalciferol)  50,000 Units Oral Q7 days   Continuous Infusions:  azithromycin 500 mg (12/11/23 1529)     LOS: 2 days      Charise Killian, MD [facility] Pager [phone]  If 7PM-7AM, please contact night-coverage [email or web address] 12/12/2023, 8:38 AM

## 2023-12-12 NOTE — Evaluation (Signed)
 Physical Therapy Evaluation Patient Details Name: Tammy [name] MRN: [id number] DOB: [birth year] Today's Date: 12/12/2023  History of Present Illness  Tammy [name] is a 63 y.o. Caucasian female with medical history significant for anxiety, dementia, asthma, bipolar 1 disorder, type 2 diabetes mellitus, CHF, COPD on home O2 at 2 L/min, and seizure disorder, presented to the emergency room with acute onset of worsening dyspnea with associated cough and wheezing over the last week.  She was diagnosed with influenza on Monday.  Clinical Impression  Pt able to follow some basic commands, but in general is very impulsive, has very little safety awareness, inability to follow commands for modifying motor patterns, all I suspect due to advanced dementia. Pt is able to AMB to door and back twice, on room air is quickly down to 84%, slow steady recovery requires brief flow increase to ~4L/min. No skilled PT services indicated at this point, will defer mobility needs to nursing.       If plan is discharge home, recommend the following:     Can travel by private vehicle   Yes    Equipment Recommendations None recommended by PT  Recommendations for Other Services       Functional Status Assessment Patient has had a recent decline in their functional status and/or demonstrates limited ability to make significant improvements in function in a reasonable and predictable amount of time     Precautions / Restrictions Precautions Precautions: Fall Restrictions Weight Bearing Restrictions Per Provider Order: No      Mobility  Bed Mobility Overal bed mobility: Needs Assistance Bed Mobility: Supine to Sit, Sit to Supine     Supine to sit: Supervision Sit to supine: Supervision   General bed mobility comments: no safety awareness, also not aware of what is a bed and what is not a bed    Transfers Overall transfer level: Needs assistance Equipment used: Rolling walker (2 wheels)                General transfer comment: appears strong, but very impulsive, takes right off into a walk    Ambulation/Gait   Gait Distance (Feet): 45 Feet Assistive device: Rolling walker (2 wheels)         General Gait Details: carried RW for half of this, then we just leave it buy the door.  Stairs            Wheelchair Mobility     Tilt Bed    Modified Rankin (Stroke Patients Only)       Balance                                             Pertinent Vitals/Pain Pain Assessment Pain Assessment: No/denies pain    Home Living Family/patient expects to be discharged to:: Group home Living Arrangements: Group Home Available Help at Discharge: Available 24 hours/day Type of Home: House Home Access: Ramped entrance     Alternate Level Stairs-Number of Steps: Per chart review, residents are not allowed on the 2nd floor and all live on main floor. Home Layout: Two level;Able to live on main level with bedroom/bathroom Home Equipment: Rolling Walker (2 wheels);Grab bars - toilet;Grab bars - tub/shower Additional Comments: Per chart, residents have 24/7 in-home care.    Prior Function  Extremity/Trunk Assessment                Communication        Cognition Arousal: Alert Behavior During Therapy: WFL for tasks assessed/performed   PT - Cognitive impairments: History of cognitive impairments                                 Cueing       General Comments      Exercises     Assessment/Plan    PT Assessment Patient does not need any further PT services  PT Problem List         PT Treatment Interventions      PT Goals (Current goals can be found in the Care Plan section)  Acute Rehab PT Goals PT Goal Formulation: All assessment and education complete, DC therapy    Frequency       Co-evaluation               AM-PAC PT "6 Clicks" Mobility  Outcome Measure Help  needed turning from your back to your side while in a flat bed without using bedrails?: A Little Help needed moving from lying on your back to sitting on the side of a flat bed without using bedrails?: A Little Help needed moving to and from a bed to a chair (including a wheelchair)?: A Little Help needed standing up from a chair using your arms (e.g., wheelchair or bedside chair)?: A Little Help needed to walk in hospital room?: A Little Help needed climbing 3-5 steps with a railing? : A Little 6 Click Score: 18    End of Session Equipment Utilized During Treatment: Oxygen Activity Tolerance: Patient tolerated treatment well;Other (comment) (disoriented to activity/task, albeit follows simple commands <50% of the time.) Patient left: in bed;with call bell/phone within reach;with bed alarm set   PT Visit Diagnosis: Other abnormalities of gait and mobility (R26.89)    Time: 1610-9604 PT Time Calculation (min) (ACUTE ONLY): 11 min   Charges:   PT Evaluation $PT Eval Moderate Complexity: 1 Mod   PT General Charges $$ ACUTE PT VISIT: 1 Visit       12:08 PM, 12/12/23 Rosamaria Lints, PT, DPT Physical Therapist - [facility]  [phone] (ASCOM)    Hobson Lax C 12/12/2023, 12:06 PM

## 2023-12-12 NOTE — Evaluation (Signed)
 Occupational Therapy Evaluation Patient Details Name: Tammy [name] MRN: [id number] DOB: 1961/07/06 Today's Date: 12/12/2023   History of Present Illness   Pt is a 63 y.o. female presented to the ER with acute onset of worsening dyspnea with associated cough and wheezing over the last week. Positive for flu a, admitted for COPD exacerbation, acute respiratory failure. PMH for anxiety, dementia, asthma, bipolar 1 disorder, type 2 diabetes mellitus, CHF, COPD on home O2 at 2 L/min, and seizure disorder.     Clinical Impressions Pt was seen for OT evaluation this date. PTA, pt was residing at group home with assist for ADLs and mobility 24/7.   Pt presents to acute OT demonstrating impaired ADL performance and functional mobility 2/2 low activity tolerance and weakness. Pt currently requires CGA/Min A for all mobility and transfers d/t dementia, inability to follow commands and no safety awareness. Appears as if she is functionally moving/performing at/near baseline, but sp02 requirement of 4L during activity d/t drop to 82% on 3L, but able to recover with rest and PLB. Placed back on 2L with return to bed at 93%-nurse notified.  Pt would benefit from skilled OT services to address noted impairments and functional limitations (see below for any additional details) in order to maximize safety and independence while minimizing falls risk and caregiver burden. Do not anticipate the need for follow up OT services upon acute hospital DC back to her group home.       If plan is discharge home, recommend the following:   A little help with walking and/or transfers;A lot of help with bathing/dressing/bathroom;Direct supervision/assist for medications management;Supervision due to cognitive status;Direct supervision/assist for financial management     Functional Status Assessment   Patient has had a recent decline in their functional status and demonstrates the ability to make significant  improvements in function in a reasonable and predictable amount of time.     Equipment Recommendations   None recommended by OT     Recommendations for Other Services         Precautions/Restrictions   Precautions Precautions: Fall Restrictions Weight Bearing Restrictions Per Provider Order: No     Mobility Bed Mobility Overal bed mobility: Needs Assistance Bed Mobility: Sit to Supine       Sit to supine: Supervision   General bed mobility comments: no safety awareness-sat in chair instead of bed then returned to bed with cueing    Transfers Overall transfer level: Needs assistance Equipment used: Rolling walker (2 wheels) Transfers: Sit to/from Stand Sit to Stand: Contact guard assist, Supervision           General transfer comment: SUP/CGA for STS from EOB to RW and mobility to the bathroom and back as well as t/f back to bed from recliner; very impuslive, unable to follow directions, unsteady      Balance Overall balance assessment: Needs assistance Sitting-balance support: Feet supported Sitting balance-Leahy Scale: Good     Standing balance support: Reliant on assistive device for balance, Bilateral upper extremity supported Standing balance-Leahy Scale: Fair Standing balance comment: Min A/CGA constant d/t bumping into things-no safety awareness                           ADL either performed or assessed with clinical judgement   ADL Overall ADL's : Needs assistance/impaired;At baseline     Grooming: Wash/dry hands;Standing;Contact guard assist;Supervision/safety           Upper Body Dressing :  Minimal assistance;Sitting Upper Body Dressing Details (indicate cue type and reason): doff/don gown     Toilet Transfer: Contact guard assist;Minimal assistance;Regular Toilet;Grab bars;Rolling walker (2 wheels)   Toileting- Clothing Manipulation and Hygiene: Supervision/safety;Sitting/lateral lean       Functional mobility  during ADLs: Minimal assistance;Contact guard assist;Cueing for safety;Rolling walker (2 wheels)       Vision         Perception         Praxis         Pertinent Vitals/Pain Pain Assessment Pain Assessment: Faces Faces Pain Scale: Hurts a little bit Pain Location: stomach Pain Intervention(s): Monitored during session     Extremity/Trunk Assessment Upper Extremity Assessment Upper Extremity Assessment: Overall WFL for tasks assessed   Lower Extremity Assessment Lower Extremity Assessment: Generalized weakness       Communication Communication Communication: No apparent difficulties   Cognition Arousal: Alert Behavior During Therapy: Impulsive Cognition: History of cognitive impairments                               Following commands: Impaired Following commands impaired: Follows one step commands inconsistently     Cueing  General Comments   Cueing Techniques: Verbal cues;Gestural cues;Tactile cues;Visual cues      Exercises Other Exercises Other Exercises: Edu on role of OT in acute setting.   Shoulder Instructions      Home Living Family/patient expects to be discharged to:: Group home Living Arrangements: Group Home Available Help at Discharge: Available 24 hours/day Type of Home: House Home Access: Ramped entrance     Home Layout: Two level;Able to live on main level with bedroom/bathroom Alternate Level Stairs-Number of Steps: Per chart review, residents are not allowed on the 2nd floor and all live on main floor.   Bathroom Shower/Tub: Producer, television/film/video: Standard     Home Equipment: Agricultural consultant (2 wheels);Grab bars - toilet;Grab bars - tub/shower   Additional Comments: Per chart, residents have 24/7 in-home care.      Prior Functioning/Environment               Mobility Comments: uses RW, would need assistance for safety as she will veer and bump into things ADLs Comments: per Maurine Minister  Clinical research associate at group home) she gets assistance with bathing/dressing, meals are all provided;    OT Problem List: Decreased strength;Decreased safety awareness;Decreased activity tolerance   OT Treatment/Interventions: Self-care/ADL training;Therapeutic exercise;Therapeutic activities;Energy conservation;DME and/or AE instruction;Patient/family education;Balance training      OT Goals(Current goals can be found in the care plan section)   Acute Rehab OT Goals Patient Stated Goal: return to group home OT Goal Formulation: With patient Time For Goal Achievement: 12/26/23 Potential to Achieve Goals: Good ADL Goals Pt Will Transfer to Toilet: with contact guard assist;regular height toilet;ambulating Pt Will Perform Toileting - Clothing Manipulation and hygiene: with supervision;sitting/lateral leans   OT Frequency:  Min 1X/week    Co-evaluation              AM-PAC OT "6 Clicks" Daily Activity     Outcome Measure Help from another person eating meals?: None Help from another person taking care of personal grooming?: A Little Help from another person toileting, which includes using toliet, bedpan, or urinal?: A Little Help from another person bathing (including washing, rinsing, drying)?: A Little Help from another person to put on and taking off regular upper body clothing?: A Little  Help from another person to put on and taking off regular lower body clothing?: A Lot 6 Click Score: 18   End of Session Equipment Utilized During Treatment: Rolling walker (2 wheels) Nurse Communication: Mobility status  Activity Tolerance: Patient tolerated treatment well Patient left: in bed;with call bell/phone within reach;with bed alarm set  OT Visit Diagnosis: Other abnormalities of gait and mobility (R26.89);Unsteadiness on feet (R26.81)                Time: 0981-1914 OT Time Calculation (min): 20 min Charges:  OT General Charges $OT Visit: 1 Visit OT Evaluation $OT Eval  Moderate Complexity: 1 Mod [place] Steidle, OTR/L 12/12/23, 1:25 PM  Halden Phegley E Eryca Bolte 12/12/2023, 1:20 PM

## 2023-12-13 DIAGNOSIS — J441 Chronic obstructive pulmonary disease with (acute) exacerbation: Secondary | ICD-10-CM | POA: Diagnosis not present

## 2023-12-13 LAB — BLOOD GAS, VENOUS
Acid-Base Excess: 8.7 mmol/L — ABNORMAL HIGH (ref 0.0–2.0)
Bicarbonate: 36.4 mmol/L — ABNORMAL HIGH (ref 20.0–28.0)
O2 Saturation: 26.3 %
Patient temperature: 37
pCO2, Ven: 63 mmHg — ABNORMAL HIGH (ref 44–60)
pH, Ven: 7.37 (ref 7.25–7.43)

## 2023-12-13 LAB — GLUCOSE, CAPILLARY
Glucose-Capillary: 98 mg/dL (ref 70–99)
Glucose-Capillary: 245 mg/dL — ABNORMAL HIGH (ref 70–99)
Glucose-Capillary: 190 mg/dL — ABNORMAL HIGH (ref 70–99)
Glucose-Capillary: 221 mg/dL — ABNORMAL HIGH (ref 70–99)
Glucose-Capillary: 194 mg/dL — ABNORMAL HIGH (ref 70–99)

## 2023-12-13 MED ORDER — AZITHROMYCIN 500 MG PO TABS
500.0000 mg | ORAL_TABLET | Freq: Every day | ORAL | Status: DC
  Administered 2023-12-13 – 2023-12-14 (×2): 500 mg via ORAL
  Filled 2023-12-13 (×2): qty 1

## 2023-12-13 NOTE — Progress Notes (Signed)
 PHARMACIST - PHYSICIAN COMMUNICATION  CONCERNING: Antibiotic IV to Oral Route Change Policy  RECOMMENDATION: This patient is receiving azithromycin by the intravenous route.  Based on criteria approved by the Pharmacy and Therapeutics Committee, the antibiotic(s) is/are being converted to the equivalent oral dose form(s).   DESCRIPTION: These criteria include: Patient being treated for a respiratory tract infection, urinary tract infection, cellulitis or clostridium difficile associated diarrhea if on metronidazole The patient is not neutropenic and does not exhibit a GI malabsorption state The patient is eating (either orally or via tube) and/or has been taking other orally administered medications for a least 24 hours The patient is improving clinically and has a Tmax < 100.5  If you have questions about this conversion, please contact the Pharmacy Department   Gardner Candle, PharmD, BCPS Clinical Pharmacist 12/13/2023 9:37 AM

## 2023-12-13 NOTE — Plan of Care (Signed)
  Problem: Clinical Measurements: Goal: Ability to maintain clinical measurements within normal limits will improve Outcome: Progressing Goal: Will remain free from infection Outcome: Progressing Goal: Diagnostic test results will improve Outcome: Progressing Goal: Respiratory complications will improve Outcome: Progressing Goal: Cardiovascular complication will be avoided Outcome: Progressing   Problem: Nutrition: Goal: Adequate nutrition will be maintained Outcome: Progressing   Problem: Elimination: Goal: Will not experience complications related to bowel motility Outcome: Progressing Goal: Will not experience complications related to urinary retention Outcome: Progressing   Problem: Safety: Goal: Ability to remain free from injury will improve Outcome: Progressing   Problem: Skin Integrity: Goal: Risk for impaired skin integrity will decrease Outcome: Progressing   Problem: Respiratory: Goal: Ability to maintain a clear airway will improve Outcome: Progressing Goal: Levels of oxygenation will improve Outcome: Progressing Goal: Ability to maintain adequate ventilation will improve Outcome: Progressing   Problem: Fluid Volume: Goal: Ability to maintain a balanced intake and output will improve Outcome: Progressing   Problem: Metabolic: Goal: Ability to maintain appropriate glucose levels will improve Outcome: Progressing   Problem: Nutritional: Goal: Maintenance of adequate nutrition will improve Outcome: Progressing Goal: Progress toward achieving an optimal weight will improve Outcome: Progressing   Problem: Skin Integrity: Goal: Risk for impaired skin integrity will decrease Outcome: Progressing   Problem: Tissue Perfusion: Goal: Adequacy of tissue perfusion will improve Outcome: Progressing

## 2023-12-13 NOTE — Progress Notes (Signed)
 Tammy [name], administrator at [facility] discussed patient's status with this RN.  Patient's sister, Dewayne Hatch "may call patient".  Maurine Minister to provide sister with new phone number as patient transferred to room closer to nurses station, 124.

## 2023-12-13 NOTE — Progress Notes (Signed)
 PROGRESS NOTE    Tammy [name]  ZOX:[id number] DOB: 1961/05/03 DOA: 12/10/2023 PCP: Armando Gang, FNP   Assessment & Plan:   Principal Problem:   COPD exacerbation (HCC) Active Problems:   Acute on chronic respiratory failure (HCC)   Hypothyroidism   Essential hypertension   Dyslipidemia   Anxiety   GERD without esophagitis   Type 2 diabetes mellitus with chronic kidney disease, with long-term current use of insulin (HCC)  Assessment and Plan: COPD exacerbation: continue on azithromycin, steroids, bronchodilators & encourage incentive spirometry. Likely secondary to influenza dx on 12/04/23   Influenza: continue w/ supportive care. Droplet precautions   Dementia: continue w/ supportive care  Dysphagia: likely secondary to dementia. Dysphagia I diet as per speech (curbside)   Acute on chronic respiratory failure: likely secondary to COPD exacerbation. Continue on supplemental oxygen and back at baseline, 2L [place]    DM2: likely well controlled, HbA1c 6.5 in 08/2023. Continue on SSI w/ accuchecks  GERD: continue on H2 blockers    Anxiety: severity unknown. Continue on home dose of buspar   HLD: continue on statin    HTN:  continue on coreg    Hypothyroidism: continue on levothyroxine         DVT prophylaxis: lovenox Code Status: full  Family Communication:  Disposition Plan: likely back to home facility   Level of care: Telemetry Medical  Status is: Inpatient Remains inpatient appropriate because: can likely d/c back to home facility tomorrow if facility will take pt back tomorrow, messaged CM     Consultants:    Procedures:   Antimicrobials: azithromycin    Subjective: Pt is pleasantly confused   Objective: Vitals:   12/12/23 1508 12/12/23 2023 12/13/23 0403 12/13/23 0726  BP: 138/82 104/64 129/71 (!) 125/54  Pulse: 92 84 96 91  Resp: 20 18 16 17   Temp: 98.3 F (36.8 C) 97.7 F (36.5 C) 97.7 F (36.5 C) 97.9 F (36.6 C)  TempSrc:  Oral   Oral  SpO2: 94% 97% 93% 96%  Weight:      Height:        Intake/Output Summary (Last 24 hours) at 12/13/2023 0809 Last data filed at 12/12/2023 2121 Gross per 24 hour  Intake 490 ml  Output --  Net 490 ml   Filed Weights   12/10/23 2038 12/10/23 2329  Weight: 60.3 kg 58.8 kg    Examination:  General exam: Appears calm & comfortable  Respiratory system: diminished breath sounds b/l  Cardiovascular system: S1 & S2+. No rubs or clicks  Gastrointestinal system: abd is soft, NT, ND & normal bowel sounds Central nervous system: alert & awake. Moves all extremities  Psychiatry: Judgement and insight appears poor. Flat mood and affect     Data Reviewed: I have personally reviewed following labs and imaging studies  CBC: Recent Labs  Lab 12/10/23 2051 12/11/23 0455  WBC 6.8 9.7  NEUTROABS 5.0  --   HGB 13.7 12.4  HCT 43.9 38.5  MCV 96.7 95.3  PLT 271 251   Basic Metabolic Panel: Recent Labs  Lab 12/10/23 2051 12/11/23 0455  NA 141 134*  K 4.4 4.5  CL 104 98  CO2 30 24  GLUCOSE 156* 257*  BUN 15 12  CREATININE 1.05* 0.89  CALCIUM 9.2 8.9   GFR: Estimated Creatinine Clearance: 51.8 mL/min (by C-G formula based on SCr of 0.89 mg/dL). Liver Function Tests: Recent Labs  Lab 12/10/23 2051  AST 20  ALT 25  ALKPHOS 80  BILITOT 0.7  PROT 6.0*  ALBUMIN 3.3*   No results for input(s): "LIPASE", "AMYLASE" in the last 168 hours. No results for input(s): "AMMONIA" in the last 168 hours. Coagulation Profile: No results for input(s): "INR", "PROTIME" in the last 168 hours. Cardiac Enzymes: No results for input(s): "CKTOTAL", "CKMB", "CKMBINDEX", "TROPONINI" in the last 168 hours. BNP (last 3 results) No results for input(s): "PROBNP" in the last 8760 hours. HbA1C: No results for input(s): "HGBA1C" in the last 72 hours. CBG: Recent Labs  Lab 12/12/23 0752 12/12/23 1123 12/12/23 1622 12/12/23 2024 12/13/23 0722  GLUCAP 116* 167* 218* 211* 98   Lipid  Profile: No results for input(s): "CHOL", "HDL", "LDLCALC", "TRIG", "CHOLHDL", "LDLDIRECT" in the last 72 hours. Thyroid Function Tests: No results for input(s): "TSH", "T4TOTAL", "FREET4", "T3FREE", "THYROIDAB" in the last 72 hours. Anemia Panel: No results for input(s): "VITAMINB12", "FOLATE", "FERRITIN", "TIBC", "IRON", "RETICCTPCT" in the last 72 hours. Sepsis Labs: No results for input(s): "PROCALCITON", "LATICACIDVEN" in the last 168 hours.  Recent Results (from the past 240 hours)  Resp panel by RT-PCR (RSV, Flu A&B, Covid) Anterior Nasal Swab     Status: Abnormal   Collection Time: 12/04/23  9:13 AM   Specimen: Anterior Nasal Swab  Result Value Ref Range Status   SARS Coronavirus 2 by RT PCR NEGATIVE NEGATIVE Final    Comment: (NOTE) SARS-CoV-2 target nucleic acids are NOT DETECTED.  The SARS-CoV-2 RNA is generally detectable in upper respiratory specimens during the acute phase of infection. The lowest concentration of SARS-CoV-2 viral copies this assay can detect is 138 copies/mL. A negative result does not preclude SARS-Cov-2 infection and should not be used as the sole basis for treatment or other patient management decisions. A negative result may occur with  improper specimen collection/handling, submission of specimen other than nasopharyngeal swab, presence of viral mutation(s) within the areas targeted by this assay, and inadequate number of viral copies(<138 copies/mL). A negative result must be combined with clinical observations, patient history, and epidemiological information. The expected result is Negative.  Fact Sheet for Patients:  BloggerCourse.com  Fact Sheet for Healthcare Providers:  SeriousBroker.it  This test is no t yet approved or cleared by the [place] FDA and  has been authorized for detection and/or diagnosis of SARS-CoV-2 by FDA under an Emergency Use Authorization (EUA). This EUA will  remain  in effect (meaning this test can be used) for the duration of the COVID-19 declaration under Section 564(b)(1) of the Act, 21 U.S.C.section 360bbb-3(b)(1), unless the authorization is terminated  or revoked sooner.       Influenza A by PCR POSITIVE (A) NEGATIVE Final   Influenza B by PCR NEGATIVE NEGATIVE Final    Comment: (NOTE) The Xpert Xpress SARS-CoV-2/FLU/RSV plus assay is intended as an aid in the diagnosis of influenza from Nasopharyngeal swab specimens and should not be used as a sole basis for treatment. Nasal washings and aspirates are unacceptable for Xpert Xpress SARS-CoV-2/FLU/RSV testing.  Fact Sheet for Patients: BloggerCourse.com  Fact Sheet for Healthcare Providers: SeriousBroker.it  This test is not yet approved or cleared by the [place] FDA and has been authorized for detection and/or diagnosis of SARS-CoV-2 by FDA under an Emergency Use Authorization (EUA). This EUA will remain in effect (meaning this test can be used) for the duration of the COVID-19 declaration under Section 564(b)(1) of the Act, 21 U.S.C. section 360bbb-3(b)(1), unless the authorization is terminated or revoked.     Resp Syncytial Virus by PCR NEGATIVE  NEGATIVE Final    Comment: (NOTE) Fact Sheet for Patients: BloggerCourse.com  Fact Sheet for Healthcare Providers: SeriousBroker.it  This test is not yet approved or cleared by the [place] FDA and has been authorized for detection and/or diagnosis of SARS-CoV-2 by FDA under an Emergency Use Authorization (EUA). This EUA will remain in effect (meaning this test can be used) for the duration of the COVID-19 declaration under Section 564(b)(1) of the Act, 21 U.S.C. section 360bbb-3(b)(1), unless the authorization is terminated or revoked.  Performed at [facility], [address], [place], [place]  [address]          Radiology Studies: No results found.       Scheduled Meds:  busPIRone  30 mg Oral BID   carvedilol  3.125 mg Oral BID WC   cyanocobalamin  1,000 mcg Oral Daily   enoxaparin (LOVENOX) injection  40 mg Subcutaneous Q24H   famotidine  20 mg Oral Daily   feeding supplement  237 mL Oral BID BM   ferrous sulfate  325 mg Oral Q breakfast   Finerenone  1 tablet Oral Daily   guaiFENesin  600 mg Oral BID   insulin aspart  0-15 Units Subcutaneous TID WC   insulin aspart  0-5 Units Subcutaneous QHS   lamoTRIgine  150 mg Oral BID   levETIRAcetam  250 mg Oral BID   levothyroxine  112 mcg Oral Q0600   lithium carbonate  150 mg Oral Daily   loratadine  10 mg Oral Daily   nicotine  21 mg Transdermal Daily   omega-3 acid ethyl esters  1 g Oral BID   pravastatin  40 mg Oral Daily   predniSONE  40 mg Oral Q breakfast   risperiDONE  3 mg Oral QHS   risperiDONE ER  75 mg Subcutaneous Q30 days   rOPINIRole  1 mg Oral QHS   Vitamin D (Ergocalciferol)  50,000 Units Oral Q7 days   Continuous Infusions:  azithromycin Stopped (12/12/23 1925)     LOS: 3 days      Charise Killian, MD [facility] Pager [phone]  If 7PM-7AM, please contact night-coverage [email or web address] 12/13/2023, 8:09 AM

## 2023-12-14 DIAGNOSIS — J441 Chronic obstructive pulmonary disease with (acute) exacerbation: Secondary | ICD-10-CM | POA: Diagnosis not present

## 2023-12-14 LAB — GLUCOSE, CAPILLARY
Glucose-Capillary: 98 mg/dL (ref 70–99)
Glucose-Capillary: 309 mg/dL — ABNORMAL HIGH (ref 70–99)

## 2023-12-14 MED ORDER — AZITHROMYCIN 500 MG PO TABS: 500.0000 mg | ORAL_TABLET | Freq: Every day | ORAL | 0 refills | Status: AC

## 2023-12-14 MED ORDER — PREDNISONE 20 MG PO TABS: 40.0000 mg | ORAL_TABLET | Freq: Every day | ORAL | 0 refills | Status: AC

## 2023-12-14 NOTE — NC FL2 (Signed)
 [place] MEDICAID FL2 LEVEL OF CARE FORM     IDENTIFICATION  Patient Name: Tammy [name] Birthdate: 1960/11/14 Sex: female Admission Date (Current Location): 12/10/2023  [name] and [place] Number:  Chiropodist and Address:  [facility], [address], [place], [place] [address]      Provider Number: [id number]  Attending Physician Name and Address:  Charise Killian, MD  Relative Name and Phone Number:  Avamarie, Crossley (Sister)  [phone] [facility] Phone)    Current Level of Care: Hospital Recommended Level of Care: Assisted Living Facility (Group home [facility].) Prior Approval Number:    Date Approved/Denied:   PASRR Number:    Discharge Plan: Other (Comment) [name] [phone] Para March [phone])    Current Diagnoses: Patient Active Problem List   Diagnosis Date Noted   Anxiety 12/11/2023   GERD without esophagitis 12/11/2023   Type 2 diabetes mellitus with chronic kidney disease, with long-term current use of insulin (HCC) 12/11/2023   COPD exacerbation (HCC) 12/10/2023   Diabetes mellitus type II, non insulin dependent (HCC) 08/15/2023   Essential hypertension 08/15/2023   Dyslipidemia 08/15/2023   Tobacco dependence due to cigarettes 08/15/2023   Chronic diastolic CHF (congestive heart failure) (HCC) 08/14/2023   Hypothyroidism 08/14/2023   Bipolar mood disorder (HCC) 08/14/2023   Seizure-like activity (HCC) 08/03/2019   B12 deficiency 06/23/2019   Iron deficiency 06/21/2019   History of seizure 04/07/2019   Seizure disorder (HCC) 03/22/2019   Normocytic anemia 03/18/2019   Influenza A 11/10/2018   Hyponatremia 10/26/2018   Syncopal episodes 06/23/2017   Acute on chronic respiratory failure with hypoxia (HCC) 03/15/2017   COPD with acute exacerbation (HCC) 03/15/2017   Acute on chronic respiratory failure (HCC) 03/15/2017    Orientation RESPIRATION BLADDER Height & Weight      Self  O2 (2L/[place] which is back to previous baseline at facility) Continent Weight: 58.8 kg Height:  5\' 2"  (157.5 cm)  BEHAVIORAL SYMPTOMS/MOOD NEUROLOGICAL BOWEL NUTRITION STATUS   (Forgetful, poor safety awareness.)   Continent Diet  AMBULATORY STATUS COMMUNICATION OF NEEDS Skin   Limited Assist (Ambulation/Gait  Gait Distance (Feet): 45 Feet  Assistive device: Rolling walker (2 wheels)  General Gait Details: carried RW for half of this, then we just leave it buy the door.) Verbally Skin abrasions                       Personal Care Assistance Level of Assistance              Functional Limitations Info             SPECIAL CARE FACTORS FREQUENCY                       Contractures Contractures Info: Not present    Additional Factors Info  Code Status, Allergies Code Status Info: Full Code Allergies Info: Penicillins, Prednisone           Current Medications (12/14/2023):  This is the current hospital active medication list Current Facility-Administered Medications  Medication Dose Route Frequency Provider Last Rate Last Admin   acetaminophen (TYLENOL) tablet 650 mg  650 mg Oral Q6H PRN Mansy, Jan A, MD   650 mg at 12/11/23 0000   Or   acetaminophen (TYLENOL) suppository 650 mg  650 mg Rectal Q6H PRN Mansy, Vernetta Honey, MD       alum & mag hydroxide-simeth (MAALOX/MYLANTA) 200-200-20 MG/5ML suspension 30  mL  30 mL Oral TID WC PRN Mansy, Vernetta Honey, MD       azithromycin [name]) tablet 500 mg  500 mg Oral Daily Gardner Candle, RPH   500 mg at 12/13/23 1556   busPIRone (BUSPAR) tablet 30 mg  30 mg Oral BID Mansy, Jan A, MD   30 mg at 12/14/23 1203   carvedilol (COREG) tablet 3.125 mg  3.125 mg Oral BID WC Mansy, Jan A, MD   3.125 mg at 12/14/23 1400   chlorpheniramine-HYDROcodone (TUSSIONEX) 10-8 MG/5ML suspension 5 mL  5 mL Oral Q12H PRN Mansy, Jan A, MD   5 mL at 12/13/23 2255   cyanocobalamin (VITAMIN B12) tablet 1,000 mcg  1,000 mcg Oral Daily Mansy, Jan A, MD    1,000 mcg at 12/14/23 1158   enoxaparin (LOVENOX) injection 40 mg  40 mg Subcutaneous Q24H Mansy, Jan A, MD   40 mg at 12/13/23 2256   famotidine (PEPCID) tablet 20 mg  20 mg Oral Daily Mansy, Jan A, MD   20 mg at 12/14/23 1200   feeding supplement (ENSURE ENLIVE / ENSURE PLUS) liquid 237 mL  237 mL Oral BID BM Mansy, Jan A, MD   237 mL at 12/14/23 1000   ferrous sulfate tablet 325 mg  325 mg Oral Q breakfast Mansy, Jan A, MD   325 mg at 12/14/23 1200   Finerenone TABS 20 mg  1 tablet Oral Daily Mansy, Jan A, MD       fluticasone (FLONASE) 50 MCG/ACT nasal spray 1 spray  1 spray Each Nare Daily PRN Mansy, Jan A, MD       guaiFENesin (MUCINEX) 12 hr tablet 600 mg  600 mg Oral BID Mansy, Jan A, MD   600 mg at 12/14/23 1203   haloperidol lactate (HALDOL) injection 5 mg  5 mg Intramuscular Q6H PRN Charise Killian, MD       insulin aspart (novoLOG) injection 0-15 Units  0-15 Units Subcutaneous TID WC Mansy, Vernetta Honey, MD   11 Units at 12/14/23 1158   insulin aspart (novoLOG) injection 0-5 Units  0-5 Units Subcutaneous QHS Mansy, Jan A, MD   2 Units at 12/13/23 2256   ipratropium-albuterol (DUONEB) 0.5-2.5 (3) MG/3ML nebulizer solution 3 mL  3 mL Nebulization Q4H PRN Charise Killian, MD       lamoTRIgine (LAMICTAL) tablet 150 mg  150 mg Oral BID Mansy, Jan A, MD   150 mg at 12/14/23 1157   levETIRAcetam (KEPPRA) tablet 250 mg  250 mg Oral BID Mansy, Jan A, MD   250 mg at 12/14/23 1200   levothyroxine (SYNTHROID) tablet 112 mcg  112 mcg Oral Q0600 Mansy, Jan A, MD   112 mcg at 12/14/23 0556   lithium carbonate capsule 150 mg  150 mg Oral Daily Mansy, Jan A, MD   150 mg at 12/14/23 1216   loratadine (CLARITIN) tablet 10 mg  10 mg Oral Daily Mansy, Jan A, MD   10 mg at 12/14/23 1200   magnesium hydroxide (MILK OF MAGNESIA) suspension 30 mL  30 mL Oral Daily PRN Mansy, Jan A, MD       nicotine (NICODERM CQ - dosed in mg/24 hours) patch 21 mg  21 mg Transdermal Daily Mansy, Jan A, MD   21 mg at 12/14/23  1030   omega-3 acid ethyl esters (LOVAZA) capsule 1 g  1 g Oral BID Mansy, Jan A, MD   1 g at 12/14/23 1159   ondansetron (ZOFRAN)  tablet 4 mg  4 mg Oral Q6H PRN Mansy, Jan A, MD       Or   ondansetron [name]) injection 4 mg  4 mg Intravenous Q6H PRN Mansy, Jan A, MD       Oral care mouth rinse  15 mL Mouth Rinse PRN Mansy, Jan A, MD       pravastatin (PRAVACHOL) tablet 40 mg  40 mg Oral Daily Mansy, Jan A, MD   40 mg at 12/14/23 1203   predniSONE (DELTASONE) tablet 40 mg  40 mg Oral Q breakfast Mansy, Jan A, MD   40 mg at 12/14/23 1157   risperiDONE (RISPERDAL) tablet 3 mg  3 mg Oral QHS Mansy, Jan A, MD   3 mg at 12/14/23 0045   risperiDONE ER (UZEDY) SUSY 75 mg  75 mg Subcutaneous Q30 days Mansy, Jan A, MD       rOPINIRole (REQUIP) tablet 1 mg  1 mg Oral QHS Mansy, Jan A, MD   1 mg at 12/13/23 2255   traZODone (DESYREL) tablet 25 mg  25 mg Oral QHS PRN Mansy, Jan A, MD   25 mg at 12/11/23 0001   Vitamin D (Ergocalciferol) (DRISDOL) 1.25 MG (50000 UNIT) capsule 50,000 Units  50,000 Units Oral Q7 days Mansy, Vernetta Honey, MD         Discharge Medications: Please see discharge summary for a list of discharge medications.  Relevant Imaging Results:  Relevant Lab Results:   Additional Information SS# [id number]  Bing Quarry, RN

## 2023-12-14 NOTE — TOC Progression Note (Addendum)
 Transition of Care [facility]) - Progression Note    Patient Details  Name: Tammy [name] MRN: [id number] Date of Birth: [birth year]  Transition of [facility]) CM/SW Contact  Bing Quarry, RN Phone Number: 12/14/2023, 1:29 PM  Clinical Narrative: 3/9: [facility] and was given number for [facility] [phone]. She has to check with administrator about returning resident today or Monday and will call RN CM back. Updated provider.  255 pm: Para March returned call, Administrator will pick up. Please give any pm meds possible due to pharmacy issues on Sunday. DC Summary and signed FL2 printed to unit printer AONCPL, unit RN aware.   Gabriel Cirri MSN RN CM  RN Case Manager [place]  Transitions of Care Direct Dial: [phone] (Weekends Only) [facility] Main Office Phone: [phone] [facility] Fax: [phone] [email or web address]          Expected Discharge Plan and Services                                               Social Determinants of Health (SDOH) Interventions SDOH Screenings   Food Insecurity: No Food Insecurity (12/11/2023)  Housing: Low Risk  (12/11/2023)  Transportation Needs: No Transportation Needs (12/11/2023)  Utilities: Not At Risk (12/11/2023)  Financial Resource Strain: Low Risk  (10/29/2018)  Physical Activity: Unknown (10/29/2018)  Social Connections: Unknown (12/11/2023)  Stress: No Stress Concern Present (10/29/2018)  Tobacco Use: Medium Risk (12/04/2023)    Readmission Risk Interventions     No data to display

## 2023-12-14 NOTE — Plan of Care (Signed)
 Pt's cognitive status makes it difficult to adequately provide education, and even harder for pt to retain knowledge.  Problem: Clinical Measurements: Goal: Ability to maintain clinical measurements within normal limits will improve Outcome: Progressing Goal: Will remain free from infection Outcome: Progressing Goal: Diagnostic test results will improve Outcome: Progressing Goal: Respiratory complications will improve Outcome: Progressing Goal: Cardiovascular complication will be avoided Outcome: Progressing   Problem: Activity: Goal: Risk for activity intolerance will decrease Outcome: Progressing   Problem: Nutrition: Goal: Adequate nutrition will be maintained Outcome: Progressing   Problem: Coping: Goal: Level of anxiety will decrease Outcome: Progressing   Problem: Elimination: Goal: Will not experience complications related to bowel motility Outcome: Progressing Goal: Will not experience complications related to urinary retention Outcome: Progressing   Problem: Pain Managment: Goal: General experience of comfort will improve and/or be controlled Outcome: Progressing   Problem: Safety: Goal: Ability to remain free from injury will improve Outcome: Progressing   Problem: Skin Integrity: Goal: Risk for impaired skin integrity will decrease Outcome: Progressing   Problem: Education: Goal: Knowledge of disease or condition will improve Outcome: Progressing Goal: Knowledge of the prescribed therapeutic regimen will improve Outcome: Progressing Goal: Individualized Educational Video(s) Outcome: Progressing   Problem: Activity: Goal: Ability to tolerate increased activity will improve Outcome: Progressing Goal: Will verbalize the importance of balancing activity with adequate rest periods Outcome: Progressing   Problem: Respiratory: Goal: Ability to maintain a clear airway will improve Outcome: Progressing Goal: Levels of oxygenation will improve Outcome:  Progressing Goal: Ability to maintain adequate ventilation will improve Outcome: Progressing   Problem: Coping: Goal: Ability to adjust to condition or change in health will improve Outcome: Progressing   Problem: Fluid Volume: Goal: Ability to maintain a balanced intake and output will improve Outcome: Progressing   Problem: Health Behavior/Discharge Planning: Goal: Ability to identify and utilize available resources and services will improve Outcome: Progressing Goal: Ability to manage health-related needs will improve Outcome: Progressing   Problem: Metabolic: Goal: Ability to maintain appropriate glucose levels will improve Outcome: Progressing   Problem: Nutritional: Goal: Maintenance of adequate nutrition will improve Outcome: Progressing Goal: Progress toward achieving an optimal weight will improve Outcome: Progressing   Problem: Skin Integrity: Goal: Risk for impaired skin integrity will decrease Outcome: Progressing   Problem: Tissue Perfusion: Goal: Adequacy of tissue perfusion will improve Outcome: Progressing

## 2023-12-14 NOTE — Progress Notes (Signed)
 PROGRESS NOTE    Tammy [name]  ZOX:[id number] DOB: 1961/04/11 DOA: 12/10/2023 PCP: Armando Gang, FNP   Assessment & Plan:   Principal Problem:   COPD exacerbation (HCC) Active Problems:   Acute on chronic respiratory failure (HCC)   Hypothyroidism   Essential hypertension   Dyslipidemia   Anxiety   GERD without esophagitis   Type 2 diabetes mellitus with chronic kidney disease, with long-term current use of insulin (HCC)  Assessment and Plan: COPD exacerbation: continue on azithromycin, steroids, bronchodilators & encourage incentive spirometry. Likely secondary to influenza dx on 12/04/23   Influenza: continue w/ supportive care. Droplet precautions  Dementia: continue w/ supportive care   Dysphagia: likely secondary to dementia. Dysphagia I diet as per speech   Acute on chronic respiratory failure: likely secondary to COPD exacerbation. Continue on supplemental oxygen and back at baseline, 2L [place]    DM2: likely well controlled, HbA1c 6.5 in 08/2023. Continue on SSI w/ accuchecks   GERD: continue on pepcid    Anxiety: severity unknown. Continue on home dose of buspar    HLD: continue on statin   HTN: continue on coreg   Hypothyroidism: continue on levothyroxine         DVT prophylaxis: lovenox Code Status: full  Family Communication:  Disposition Plan: likely back to home facility   Level of care: Telemetry Medical  Status is: Inpatient Remains inpatient appropriate because: medically stable. Waiting to see when and if pt's home facility will take the pt back and CM has been unable to reach them so far     Consultants:    Procedures:   Antimicrobials: azithromycin    Subjective: Pt is still pleasantly confused   Objective: Vitals:   12/13/23 0726 12/13/23 1725 12/13/23 2024 12/14/23 0509  BP: (!) 125/54 132/82 118/79 104/65  Pulse: 91 80 82 71  Resp: 17 18 16 12   Temp: 97.9 F (36.6 C) 98.3 F (36.8 C) 98.9 F (37.2 C) 97.7 F  (36.5 C)  TempSrc: Oral Oral  Oral  SpO2: 96% 96% 96% 98%  Weight:      Height:        Intake/Output Summary (Last 24 hours) at 12/14/2023 0814 Last data filed at 12/13/2023 2254 Gross per 24 hour  Intake 360 ml  Output --  Net 360 ml   Filed Weights   12/10/23 2038 12/10/23 2329  Weight: 60.3 kg 58.8 kg    Examination:  General exam: appears comfortable Respiratory system: decreased breath sounds b/l  Cardiovascular system: S1/S2+. No rubs or clicks  Gastrointestinal system: abd is soft, NT, ND & normal bowel sounds Central nervous system: alert & awake. Moves all extremities   Psychiatry: judgement and insight appears poor. Flat mood and affect     Data Reviewed: I have personally reviewed following labs and imaging studies  CBC: Recent Labs  Lab 12/10/23 2051 12/11/23 0455  WBC 6.8 9.7  NEUTROABS 5.0  --   HGB 13.7 12.4  HCT 43.9 38.5  MCV 96.7 95.3  PLT 271 251   Basic Metabolic Panel: Recent Labs  Lab 12/10/23 2051 12/11/23 0455  NA 141 134*  K 4.4 4.5  CL 104 98  CO2 30 24  GLUCOSE 156* 257*  BUN 15 12  CREATININE 1.05* 0.89  CALCIUM 9.2 8.9   GFR: Estimated Creatinine Clearance: 51.8 mL/min (by C-G formula based on SCr of 0.89 mg/dL). Liver Function Tests: Recent Labs  Lab 12/10/23 2051  AST 20  ALT 25  ALKPHOS 80  BILITOT 0.7  PROT 6.0*  ALBUMIN 3.3*   No results for input(s): "LIPASE", "AMYLASE" in the last 168 hours. No results for input(s): "AMMONIA" in the last 168 hours. Coagulation Profile: No results for input(s): "INR", "PROTIME" in the last 168 hours. Cardiac Enzymes: No results for input(s): "CKTOTAL", "CKMB", "CKMBINDEX", "TROPONINI" in the last 168 hours. BNP (last 3 results) No results for input(s): "PROBNP" in the last 8760 hours. HbA1C: No results for input(s): "HGBA1C" in the last 72 hours. CBG: Recent Labs  Lab 12/13/23 1132 12/13/23 1724 12/13/23 2026 12/13/23 2206 12/14/23 0749  GLUCAP 245* 190* 221* 194*  98   Lipid Profile: No results for input(s): "CHOL", "HDL", "LDLCALC", "TRIG", "CHOLHDL", "LDLDIRECT" in the last 72 hours. Thyroid Function Tests: No results for input(s): "TSH", "T4TOTAL", "FREET4", "T3FREE", "THYROIDAB" in the last 72 hours. Anemia Panel: No results for input(s): "VITAMINB12", "FOLATE", "FERRITIN", "TIBC", "IRON", "RETICCTPCT" in the last 72 hours. Sepsis Labs: No results for input(s): "PROCALCITON", "LATICACIDVEN" in the last 168 hours.  Recent Results (from the past 240 hours)  Resp panel by RT-PCR (RSV, Flu A&B, Covid) Anterior Nasal Swab     Status: Abnormal   Collection Time: 12/04/23  9:13 AM   Specimen: Anterior Nasal Swab  Result Value Ref Range Status   SARS Coronavirus 2 by RT PCR NEGATIVE NEGATIVE Final    Comment: (NOTE) SARS-CoV-2 target nucleic acids are NOT DETECTED.  The SARS-CoV-2 RNA is generally detectable in upper respiratory specimens during the acute phase of infection. The lowest concentration of SARS-CoV-2 viral copies this assay can detect is 138 copies/mL. A negative result does not preclude SARS-Cov-2 infection and should not be used as the sole basis for treatment or other patient management decisions. A negative result may occur with  improper specimen collection/handling, submission of specimen other than nasopharyngeal swab, presence of viral mutation(s) within the areas targeted by this assay, and inadequate number of viral copies(<138 copies/mL). A negative result must be combined with clinical observations, patient history, and epidemiological information. The expected result is Negative.  Fact Sheet for Patients:  BloggerCourse.com  Fact Sheet for Healthcare Providers:  SeriousBroker.it  This test is no t yet approved or cleared by the [place] FDA and  has been authorized for detection and/or diagnosis of SARS-CoV-2 by FDA under an Emergency Use Authorization (EUA).  This EUA will remain  in effect (meaning this test can be used) for the duration of the COVID-19 declaration under Section 564(b)(1) of the Act, 21 U.S.C.section 360bbb-3(b)(1), unless the authorization is terminated  or revoked sooner.       Influenza A by PCR POSITIVE (A) NEGATIVE Final   Influenza B by PCR NEGATIVE NEGATIVE Final    Comment: (NOTE) The Xpert Xpress SARS-CoV-2/FLU/RSV plus assay is intended as an aid in the diagnosis of influenza from Nasopharyngeal swab specimens and should not be used as a sole basis for treatment. Nasal washings and aspirates are unacceptable for Xpert Xpress SARS-CoV-2/FLU/RSV testing.  Fact Sheet for Patients: BloggerCourse.com  Fact Sheet for Healthcare Providers: SeriousBroker.it  This test is not yet approved or cleared by the [place] FDA and has been authorized for detection and/or diagnosis of SARS-CoV-2 by FDA under an Emergency Use Authorization (EUA). This EUA will remain in effect (meaning this test can be used) for the duration of the COVID-19 declaration under Section 564(b)(1) of the Act, 21 U.S.C. section 360bbb-3(b)(1), unless the authorization is terminated or revoked.     Resp Syncytial Virus  by PCR NEGATIVE NEGATIVE Final    Comment: (NOTE) Fact Sheet for Patients: BloggerCourse.com  Fact Sheet for Healthcare Providers: SeriousBroker.it  This test is not yet approved or cleared by the [place] FDA and has been authorized for detection and/or diagnosis of SARS-CoV-2 by FDA under an Emergency Use Authorization (EUA). This EUA will remain in effect (meaning this test can be used) for the duration of the COVID-19 declaration under Section 564(b)(1) of the Act, 21 U.S.C. section 360bbb-3(b)(1), unless the authorization is terminated or revoked.  Performed at [facility], [address],  [place], [place] [address]          Radiology Studies: No results found.       Scheduled Meds:  azithromycin  500 mg Oral Daily   busPIRone  30 mg Oral BID   carvedilol  3.125 mg Oral BID WC   cyanocobalamin  1,000 mcg Oral Daily   enoxaparin (LOVENOX) injection  40 mg Subcutaneous Q24H   famotidine  20 mg Oral Daily   feeding supplement  237 mL Oral BID BM   ferrous sulfate  325 mg Oral Q breakfast   Finerenone  1 tablet Oral Daily   guaiFENesin  600 mg Oral BID   insulin aspart  0-15 Units Subcutaneous TID WC   insulin aspart  0-5 Units Subcutaneous QHS   lamoTRIgine  150 mg Oral BID   levETIRAcetam  250 mg Oral BID   levothyroxine  112 mcg Oral Q0600   lithium carbonate  150 mg Oral Daily   loratadine  10 mg Oral Daily   nicotine  21 mg Transdermal Daily   omega-3 acid ethyl esters  1 g Oral BID   pravastatin  40 mg Oral Daily   predniSONE  40 mg Oral Q breakfast   risperiDONE  3 mg Oral QHS   risperiDONE ER  75 mg Subcutaneous Q30 days   rOPINIRole  1 mg Oral QHS   Vitamin D (Ergocalciferol)  50,000 Units Oral Q7 days   Continuous Infusions:     LOS: 4 days      Charise Killian, MD [facility] Pager [phone]  If 7PM-7AM, please contact night-coverage [email or web address] 12/14/2023, 8:14 AM

## 2023-12-14 NOTE — Discharge Summary (Signed)
 Physician Discharge Summary  Tammy [name] XBJ:[id number] DOB: 09-02-1961 DOA: 12/10/2023  PCP: Armando Gang, FNP  Admit date: 12/10/2023 Discharge date: 12/14/2023  Admitted From: home facility  Disposition:  home facility  Recommendations for Outpatient Follow-up:  Follow up with PCP in 1-2 weeks   Home Health: no Equipment/Devices: chronically on 2L [place]  Discharge Condition: stable  CODE STATUS: full  Diet recommendation: Dysphagia I diet, but carb modified   Brief/Interim Summary: HPI was taken from Dr. Arville Care: Tammy [name] is a 63 y.o. Caucasian female with medical history significant for anxiety, dementia, asthma, bipolar 1 disorder, type 2 diabetes mellitus, CHF, COPD on home O2 at 2 L/min, and seizure disorder, presented to the emergency room with acute onset of worsening dyspnea with associated cough and wheezing over the last week.  She was diagnosed with influenza on Monday.  She admits to diarrhea without nausea or vomiting or abdominal pain.  No fever or chills.  No dysuria, oliguria or hematuria or flank pain.  She denies any chest pain or palpitations.  No bleeding diathesis. She denies any chest pain or palpitations.  No bleeding diathesis.   ED Course: When she came to the ER, respiratory rate was 22 and pulse oximetry 94% on 3 L of O2 by nasal cannula with otherwise unremarkable vital signs.  Labs revealed venous blood gas with pH 7.37 and pCO2 63 with pO2 less than 31 and HCO3 36.4.  CMP revealed blood glucose of 156 and albumin 3.3 with total protein 6.  High sensitive troponin I was 19 and later 20.  CBC was within normal. EKG as reviewed by me : EKG showed normal sinus rhythm with rate of 87. Imaging: Portable chest x-ray showed cardiomegaly with pulmonary vascular congestion.   The patient was given 2 DuoNebs and 125 mg of IV Solu-Medrol.  She will be admitted to a medical telemetry bed for further evaluation and management.  Discharge Diagnoses:   Principal Problem:   COPD exacerbation (HCC) Active Problems:   Acute on chronic respiratory failure (HCC)   Hypothyroidism   Essential hypertension   Dyslipidemia   Anxiety   GERD without esophagitis   Type 2 diabetes mellitus with chronic kidney disease, with long-term current use of insulin (HCC)  COPD exacerbation: continue on azithromycin x1 day more, steroids, bronchodilators & encourage incentive spirometry. Likely secondary to influenza dx on 12/04/23    Influenza: continue w/ supportive care. Droplet precautions   Dementia: continue w/ supportive care    Dysphagia: likely secondary to dementia. Dysphagia I diet as per speech   Acute on chronic respiratory failure: likely secondary to COPD exacerbation. Continue on supplemental oxygen and back at baseline, 2L [place]     DM2: likely well controlled, HbA1c 6.5 in 08/2023. Continue on home anti-DM2 meds at d/c    GERD: continue on pepcid    Anxiety: severity unknown. Continue on home dose of buspar    HLD: continue on statin   HTN: continue on coreg   Hypothyroidism: continue on levothyroxine   Discharge Instructions  Discharge Instructions     Diet - low sodium heart healthy   Complete by: As directed    Dysphagia I diet   Discharge instructions   Complete by: As directed    F/u w/ PCP in 1-2 weeks   Increase activity slowly   Complete by: As directed       Allergies as of 12/14/2023       Reactions   Penicillins Rash  Has patient had a PCN reaction causing immediate rash, facial/tongue/throat swelling, SOB or lightheadedness with hypotension: No Has patient had a PCN reaction causing severe rash involving mucus membranes or skin necrosis: No Has patient had a PCN reaction that required hospitalization: No Has patient had a PCN reaction occurring within the last 10 years: No If all of the above answers are "NO", then may proceed with Cephalosporin use.   Prednisone Rash        Medication List      STOP taking these medications    doxycycline 100 MG capsule Commonly known as: VIBRAMYCIN   doxycycline 100 MG tablet Commonly known as: VIBRA-TABS   methylPREDNISolone 4 MG Tbpk tablet Commonly known as: MEDROL DOSEPAK       TAKE these medications    albuterol 108 (90 Base) MCG/ACT inhaler Commonly known as: VENTOLIN HFA Inhale 1-2 puffs into the lungs every 6 (six) hours as needed for wheezing or shortness of breath.   aluminum-magnesium hydroxide-simethicone 200-200-20 MG/5ML Susp Commonly known as: MAALOX Take 30 mLs by mouth 4 (four) times daily -  before meals and at bedtime.   azithromycin 500 MG tablet Commonly known as: ZITHROMAX Take 1 tablet (500 mg total) by mouth daily for 1 day.   busPIRone 30 MG tablet Commonly known as: BUSPAR Take 30 mg by mouth 2 (two) times daily.   carvedilol 3.125 MG tablet Commonly known as: COREG Take 3.125 mg by mouth 2 (two) times daily with a meal.   cetirizine 10 MG tablet Commonly known as: ZYRTEC Take 10 mg by mouth daily.   Cyanocobalamin 1000 MCG Subl Place 1 tablet (1,000 mcg total) under the tongue daily.   empagliflozin 25 MG Tabs tablet Commonly known as: JARDIANCE Take 25 mg by mouth daily.   famotidine 20 MG tablet Commonly known as: PEPCID Take 1 tablet (20 mg total) by mouth 2 (two) times daily.   ferrous sulfate 325 (65 FE) MG EC tablet Take 1 tablet (325 mg total) by mouth daily with breakfast.   Fish Oil 1000 MG Caps Take 1,000 mg by mouth 2 (two) times daily.   fluticasone 50 MCG/ACT nasal spray Commonly known as: FLONASE Place 1 spray into both nostrils daily.   Kerendia 20 MG Tabs Generic drug: Finerenone Take 1 tablet by mouth daily.   lamoTRIgine 150 MG tablet Commonly known as: LAMICTAL Take 150 mg by mouth 2 (two) times daily.   levETIRAcetam 250 MG tablet Commonly known as: KEPPRA Take 250 mg by mouth 2 (two) times daily.   levothyroxine 112 MCG tablet Commonly known as:  SYNTHROID Take 112 mcg by mouth daily before breakfast.   lithium carbonate 150 MG capsule Take 150 mg by mouth daily.   metFORMIN 500 MG tablet Commonly known as: GLUCOPHAGE Take 500 mg by mouth 2 (two) times daily with a meal.   nicotine 21 mg/24hr patch Commonly known as: NICODERM CQ - dosed in mg/24 hours Place 1 patch (21 mg total) onto the skin daily.   ondansetron 4 MG tablet Commonly known as: ZOFRAN Take 1 tablet (4 mg total) by mouth every 8 (eight) hours as needed for nausea or vomiting.   pravastatin 40 MG tablet Commonly known as: PRAVACHOL Take 40 mg by mouth daily.   predniSONE 20 MG tablet Commonly known as: DELTASONE Take 2 tablets (40 mg total) by mouth daily with breakfast for 4 days. Start taking on: December 15, 2023   risperiDONE 3 MG tablet Commonly known as: RISPERDAL Take  3 mg by mouth at bedtime.   Uzedy 75 MG/0.21ML Susy Generic drug: risperiDONE ER Inject 75 mg into the skin every 30 (thirty) days.   rOPINIRole 1 MG tablet Commonly known as: REQUIP Take 1 mg by mouth at bedtime.   Trelegy Ellipta 100-62.5-25 MCG/INH Aepb Generic drug: Fluticasone-Umeclidin-Vilant Inhale 1 puff into the lungs daily.   Vitamin D (Ergocalciferol) 1.25 MG (50000 UNIT) Caps capsule Commonly known as: DRISDOL Take 50,000 Units by mouth every 7 (seven) days.        Allergies  Allergen Reactions   Penicillins Rash    Has patient had a PCN reaction causing immediate rash, facial/tongue/throat swelling, SOB or lightheadedness with hypotension: No Has patient had a PCN reaction causing severe rash involving mucus membranes or skin necrosis: No Has patient had a PCN reaction that required hospitalization: No Has patient had a PCN reaction occurring within the last 10 years: No If all of the above answers are "NO", then may proceed with Cephalosporin use.    Prednisone Rash    Consultations:    Procedures/Studies: DG Chest Portable 1 View Result Date:  12/10/2023 CLINICAL DATA:  Shortness of breath EXAM: PORTABLE CHEST 1 VIEW COMPARISON:  12/04/2023 FINDINGS: Stable cardiomegaly. Aortic atherosclerotic calcification. Pulmonary vascular congestion. No focal consolidation, pleural effusion, or pneumothorax. No displaced rib fractures. IMPRESSION: Cardiomegaly with pulmonary vascular congestion. Electronically Signed   By: Minerva Fester M.D.   On: 12/10/2023 23:14   DG Chest Portable 1 View Result Date: 12/04/2023 CLINICAL DATA:  63 year old female with cough and shortness of breath. EXAM: PORTABLE CHEST 1 VIEW COMPARISON:  Chest radiographs 09/06/2023 and earlier. FINDINGS: Portable AP semi upright view at 0941 hours. Stable cardiac size at the upper limits of normal. Calcified aortic atherosclerosis. Other mediastinal contours are within normal limits. Visualized tracheal air column is within normal limits. Stable lung volumes. Allowing for portable technique the lungs are clear. Chronic epigastric surgical clips. No acute osseous abnormality identified. IMPRESSION: 1. No acute cardiopulmonary abnormality. 2. Aortic Atherosclerosis (ICD10-I70.0). Electronically Signed   By: Odessa Fleming M.D.   On: 12/04/2023 12:01   (Echo, Carotid, EGD, Colonoscopy, ERCP)    Subjective: Pt is pleasantly confused   Discharge Exam: Vitals:   12/14/23 0831 12/14/23 1247  BP: (!) 99/52 133/70  Pulse: 87   Resp: 19   Temp: 97.6 F (36.4 C)   SpO2: 92%    Vitals:   12/13/23 2024 12/14/23 0509 12/14/23 0831 12/14/23 1247  BP: 118/79 104/65 (!) 99/52 133/70  Pulse: 82 71 87   Resp: 16 12 19    Temp: 98.9 F (37.2 C) 97.7 F (36.5 C) 97.6 F (36.4 C)   TempSrc:  Oral    SpO2: 96% 98% 92%   Weight:      Height:        General: Pt is alert, awake, not in acute distress Cardiovascular: S1/S2 +, no rubs, no gallops Respiratory: decreased breath sounds b/l  Abdominal: Soft, NT, ND, bowel sounds + Extremities: no edema, no cyanosis    The results of  significant diagnostics from this hospitalization (including imaging, microbiology, ancillary and laboratory) are listed below for reference.     Microbiology: No results found for this or any previous visit (from the past 240 hours).   Labs: BNP (last 3 results) Recent Labs    08/14/23 2040 12/10/23 2051  BNP 37.8 27.8   Basic Metabolic Panel: Recent Labs  Lab 12/10/23 2051 12/11/23 0455  NA 141 134*  K  4.4 4.5  CL 104 98  CO2 30 24  GLUCOSE 156* 257*  BUN 15 12  CREATININE 1.05* 0.89  CALCIUM 9.2 8.9   Liver Function Tests: Recent Labs  Lab 12/10/23 2051  AST 20  ALT 25  ALKPHOS 80  BILITOT 0.7  PROT 6.0*  ALBUMIN 3.3*   No results for input(s): "LIPASE", "AMYLASE" in the last 168 hours. No results for input(s): "AMMONIA" in the last 168 hours. CBC: Recent Labs  Lab 12/10/23 2051 12/11/23 0455  WBC 6.8 9.7  NEUTROABS 5.0  --   HGB 13.7 12.4  HCT 43.9 38.5  MCV 96.7 95.3  PLT 271 251   Cardiac Enzymes: No results for input(s): "CKTOTAL", "CKMB", "CKMBINDEX", "TROPONINI" in the last 168 hours. BNP: Invalid input(s): "POCBNP" CBG: Recent Labs  Lab 12/13/23 1724 12/13/23 2026 12/13/23 2206 12/14/23 0749 12/14/23 1135  GLUCAP 190* 221* 194* 98 309*   D-Dimer No results for input(s): "DDIMER" in the last 72 hours. Hgb A1c No results for input(s): "HGBA1C" in the last 72 hours. Lipid Profile No results for input(s): "CHOL", "HDL", "LDLCALC", "TRIG", "CHOLHDL", "LDLDIRECT" in the last 72 hours. Thyroid function studies No results for input(s): "TSH", "T4TOTAL", "T3FREE", "THYROIDAB" in the last 72 hours.  Invalid input(s): "FREET3" Anemia work up No results for input(s): "VITAMINB12", "FOLATE", "FERRITIN", "TIBC", "IRON", "RETICCTPCT" in the last 72 hours. Urinalysis    Component Value Date/Time   COLORURINE YELLOW (A) 08/15/2023 0211   APPEARANCEUR HAZY (A) 08/15/2023 0211   LABSPEC 1.013 08/15/2023 0211   PHURINE 5.0 08/15/2023 0211    GLUCOSEU >=500 (A) 08/15/2023 0211   HGBUR SMALL (A) 08/15/2023 0211   BILIRUBINUR negative 10/10/2023 1750   KETONESUR negative 10/10/2023 1750   KETONESUR NEGATIVE 08/15/2023 0211   PROTEINUR negative 10/10/2023 1750   PROTEINUR NEGATIVE 08/15/2023 0211   UROBILINOGEN 0.2 10/10/2023 1750   NITRITE Negative 10/10/2023 1750   NITRITE NEGATIVE 08/15/2023 0211   LEUKOCYTESUR Small (1+) (A) 10/10/2023 1750   LEUKOCYTESUR NEGATIVE 08/15/2023 0211   Sepsis Labs Recent Labs  Lab 12/10/23 2051 12/11/23 0455  WBC 6.8 9.7   Microbiology No results found for this or any previous visit (from the past 240 hours).   Time coordinating discharge: Over 30 minutes  SIGNED:   Charise Killian, MD  [facility] 12/14/2023, 2:45 PM Pager   If 7PM-7AM, please contact night-coverage [email or web address]

## 2024-02-26 ENCOUNTER — Ambulatory Visit (INDEPENDENT_AMBULATORY_CARE_PROVIDER_SITE_OTHER): Payer: Medicare Other | Admitting: Podiatry

## 2024-02-26 ENCOUNTER — Encounter: Payer: Self-pay | Admitting: Podiatry

## 2024-02-26 DIAGNOSIS — E119 Type 2 diabetes mellitus without complications: Secondary | ICD-10-CM | POA: Diagnosis not present

## 2024-02-26 DIAGNOSIS — M79676 Pain in unspecified toe(s): Secondary | ICD-10-CM

## 2024-02-26 DIAGNOSIS — M79609 Pain in unspecified limb: Secondary | ICD-10-CM | POA: Diagnosis not present

## 2024-02-26 DIAGNOSIS — B351 Tinea unguium: Secondary | ICD-10-CM

## 2024-02-26 NOTE — Progress Notes (Unsigned)
  Subjective:  Patient ID: Tammy [name], female    DOB: 1961/10/07,  MRN: [id number]  63 y.o. female presents to clinic with  preventative diabetic foot care and painful elongated mycotic toenails 1-5 bilaterally which are tender when wearing enclosed shoe gear. Pain is relieved with periodic professional debridement. No chief complaint on file.  New problem(s): None   PCP is Sharyne Degree, FNP.  Allergies  Allergen Reactions   Penicillins Rash    Has patient had a PCN reaction causing immediate rash, facial/tongue/throat swelling, SOB or lightheadedness with hypotension: No Has patient had a PCN reaction causing severe rash involving mucus membranes or skin necrosis: No Has patient had a PCN reaction that required hospitalization: No Has patient had a PCN reaction occurring within the last 10 years: No If all of the above answers are "NO", then may proceed with Cephalosporin use.    Prednisone  Rash    Review of Systems: Negative except as noted in the HPI.   Objective:  Tammy [name] is a pleasant 63 y.o. female WD, WN in NAD. AAO x 3.  Vascular Examination: Vascular status intact b/l with palpable pedal pulses. CFT immediate b/l. No edema. No pain with calf compression b/l. Skin temperature gradient WNL b/l. No ischemia or gangrene noted b/l LE. No cyanosis or clubbing noted b/l LE.  Neurological Examination: Sensation grossly intact b/l with 10 gram monofilament. Vibratory sensation intact b/l.   Dermatological Examination: Pedal skin with normal turgor, texture and tone b/l. Toenails 1-5 b/l thick, discolored, elongated with subungual debris and pain on dorsal palpation. No hyperkeratotic lesions noted b/l.   Musculoskeletal Examination: Muscle strength 5/5 to b/l LE. No pain, crepitus or joint limitation noted with ROM bilateral LE. No gross bony deformities bilaterally.  Radiographs: None  Last A1c:      Latest Ref Rng & Units 08/16/2023    4:28 AM   Hemoglobin A1C  Hemoglobin-A1c 4.8 - 5.6 % 6.5      Assessment:   1. Pain due to onychomycosis of nail   2. Diabetes mellitus without complication (HCC)    Plan:  Patient was evaluated and treated. All patient's and/or POA's questions/concerns addressed on today's visit. Toenails 1-5 debrided in length and girth without incident. Continue foot and shoe inspections daily. Monitor blood glucose per PCP/Endocrinologist's recommendations. Continue soft, supportive shoe gear daily. Report any pedal injuries to medical professional. Call office if there are any questions/concerns. -Patient/POA to call should there be question/concern in the interim.  Return in about 3 months (around 05/28/2024).  Luella Sager, DPM      [facility] LOCATION: [address] [place] [address]                   Office [phone]   [facility] LOCATION: [address] [place], [place] [address] Office [phone]

## 2024-03-01 ENCOUNTER — Encounter: Payer: Self-pay | Admitting: Podiatry

## 2024-05-28 ENCOUNTER — Encounter: Payer: Self-pay | Admitting: Podiatry

## 2024-05-28 ENCOUNTER — Ambulatory Visit (INDEPENDENT_AMBULATORY_CARE_PROVIDER_SITE_OTHER): Admitting: Podiatry

## 2024-05-28 DIAGNOSIS — M79609 Pain in unspecified limb: Secondary | ICD-10-CM

## 2024-05-28 DIAGNOSIS — E119 Type 2 diabetes mellitus without complications: Secondary | ICD-10-CM | POA: Diagnosis not present

## 2024-05-28 DIAGNOSIS — B351 Tinea unguium: Secondary | ICD-10-CM | POA: Diagnosis not present

## 2024-05-28 DIAGNOSIS — M79676 Pain in unspecified toe(s): Secondary | ICD-10-CM | POA: Diagnosis not present

## 2024-06-01 NOTE — Progress Notes (Signed)
  Subjective:  Patient ID: Tammy [name], female    DOB: 12/07/1960,  MRN: [id number]  Tammy [name] presents to clinic today for preventative diabetic foot care for painful thick toenails that are difficult to trim. Pain interferes with ambulation. Aggravating factors include wearing enclosed shoe gear. Pain is relieved with periodic professional debridement. She is accompanied by her caregiver on today's visit. Chief Complaint  Patient presents with   [facility]    Rm1 Diabectic foot care/ Dr. Donal last visit April 28 2024   New problem(s): None.   PCP is Donal Channing SQUIBB, FNP.  Allergies  Allergen Reactions   Penicillins Rash    Has patient had a PCN reaction causing immediate rash, facial/tongue/throat swelling, SOB or lightheadedness with hypotension: No Has patient had a PCN reaction causing severe rash involving mucus membranes or skin necrosis: No Has patient had a PCN reaction that required hospitalization: No Has patient had a PCN reaction occurring within the last 10 years: No If all of the above answers are NO, then may proceed with Cephalosporin use.    Prednisone  Rash    Review of Systems: Negative except as noted in the HPI.  Objective: No changes noted in today's physical examination. There were no vitals filed for this visit. Tammy [name] is a pleasant 63 y.o. female WD, WN in NAD. AAO x 3. On supplemental oxygen.  Vascular Examination: Vascular status intact b/l with palpable pedal pulses. CFT immediate b/l. No edema. No pain with calf compression b/l. Skin temperature gradient WNL b/l. No ischemia or gangrene noted b/l LE. No cyanosis or clubbing noted b/l LE.  Neurological Examination: Sensation grossly intact b/l with 10 gram monofilament. Vibratory sensation intact b/l.   Dermatological Examination: Pedal skin with normal turgor, texture and tone b/l. Toenails 1-5 b/l thick, discolored, elongated with subungual debris and pain on dorsal  palpation. No hyperkeratotic lesions noted b/l.   Musculoskeletal Examination: Muscle strength 5/5 to b/l LE. No pain, crepitus or joint limitation noted with ROM bilateral LE. No gross bony deformities bilaterally.  Radiographs: None  Assessment/Plan: 1. Pain due to onychomycosis of nail   2. Diabetes mellitus without complication [facility])     Consent given for treatment. Patient examined. All patient's and/or POA's questions/concerns addressed on today's visit. Mycotic toenails 1-5 debrided in length and girth without incident. Continue foot and shoe inspections daily. Monitor blood glucose per PCP/Endocrinologist's recommendations.Continue soft, supportive shoe gear daily. Report any pedal injuries to medical professional. Call office if there are any quesitons/concerns. -Patient/POA to call should there be question/concern in the interim.   Return in about 3 months (around 08/28/2024).  Delon LITTIE Merlin, DPM      [place] LOCATION: [address], [place] [address]                   Office [phone]   [facility] LOCATION: [address] [place], [place] [address] Office [phone]

## 2024-07-21 ENCOUNTER — Other Ambulatory Visit: Payer: Self-pay

## 2024-07-21 ENCOUNTER — Emergency Department

## 2024-07-21 ENCOUNTER — Inpatient Hospital Stay
Admission: EM | Admit: 2024-07-21 | Discharge: 2024-07-23 | DRG: 193 | Disposition: A | Discharge status: Home / Self Care | Attending: Family Medicine | Admitting: Family Medicine

## 2024-07-21 DIAGNOSIS — Z87891 Personal history of nicotine dependence: Secondary | ICD-10-CM

## 2024-07-21 DIAGNOSIS — J440 Chronic obstructive pulmonary disease with acute lower respiratory infection: Secondary | ICD-10-CM | POA: Diagnosis present

## 2024-07-21 DIAGNOSIS — Z888 Allergy status to other drugs, medicaments and biological substances status: Secondary | ICD-10-CM

## 2024-07-21 DIAGNOSIS — Z9981 Dependence on supplemental oxygen: Secondary | ICD-10-CM

## 2024-07-21 DIAGNOSIS — I110 Hypertensive heart disease with heart failure: Secondary | ICD-10-CM | POA: Diagnosis present

## 2024-07-21 DIAGNOSIS — F319 Bipolar disorder, unspecified: Secondary | ICD-10-CM | POA: Diagnosis present

## 2024-07-21 DIAGNOSIS — Z880 Allergy status to penicillin: Secondary | ICD-10-CM

## 2024-07-21 DIAGNOSIS — J189 Pneumonia, unspecified organism: Principal | ICD-10-CM | POA: Diagnosis present

## 2024-07-21 DIAGNOSIS — R1310 Dysphagia, unspecified: Secondary | ICD-10-CM | POA: Diagnosis present

## 2024-07-21 DIAGNOSIS — E119 Type 2 diabetes mellitus without complications: Secondary | ICD-10-CM | POA: Diagnosis present

## 2024-07-21 DIAGNOSIS — R0600 Dyspnea, unspecified: Secondary | ICD-10-CM

## 2024-07-21 DIAGNOSIS — K219 Gastro-esophageal reflux disease without esophagitis: Secondary | ICD-10-CM | POA: Diagnosis present

## 2024-07-21 DIAGNOSIS — E785 Hyperlipidemia, unspecified: Secondary | ICD-10-CM | POA: Diagnosis present

## 2024-07-21 DIAGNOSIS — F0394 Unspecified dementia, unspecified severity, with anxiety: Secondary | ICD-10-CM | POA: Diagnosis present

## 2024-07-21 DIAGNOSIS — Z7984 Long term (current) use of oral hypoglycemic drugs: Secondary | ICD-10-CM

## 2024-07-21 DIAGNOSIS — J188 Other pneumonia, unspecified organism: Secondary | ICD-10-CM

## 2024-07-21 DIAGNOSIS — Z5320 Procedure and treatment not carried out because of patient's decision for unspecified reasons: Secondary | ICD-10-CM | POA: Diagnosis not present

## 2024-07-21 DIAGNOSIS — I5032 Chronic diastolic (congestive) heart failure: Secondary | ICD-10-CM | POA: Diagnosis present

## 2024-07-21 DIAGNOSIS — K5900 Constipation, unspecified: Secondary | ICD-10-CM | POA: Diagnosis present

## 2024-07-21 DIAGNOSIS — Z1152 Encounter for screening for COVID-19: Secondary | ICD-10-CM

## 2024-07-21 DIAGNOSIS — J441 Chronic obstructive pulmonary disease with (acute) exacerbation: Secondary | ICD-10-CM | POA: Diagnosis present

## 2024-07-21 DIAGNOSIS — Z79899 Other long term (current) drug therapy: Secondary | ICD-10-CM

## 2024-07-21 DIAGNOSIS — Z8249 Family history of ischemic heart disease and other diseases of the circulatory system: Secondary | ICD-10-CM

## 2024-07-21 DIAGNOSIS — G40909 Epilepsy, unspecified, not intractable, without status epilepticus: Secondary | ICD-10-CM | POA: Diagnosis present

## 2024-07-21 DIAGNOSIS — F0393 Unspecified dementia, unspecified severity, with mood disturbance: Secondary | ICD-10-CM | POA: Diagnosis present

## 2024-07-21 DIAGNOSIS — J810 Acute pulmonary edema: Secondary | ICD-10-CM

## 2024-07-21 DIAGNOSIS — J9621 Acute and chronic respiratory failure with hypoxia: Principal | ICD-10-CM | POA: Diagnosis present

## 2024-07-21 DIAGNOSIS — Z79890 Hormone replacement therapy (postmenopausal): Secondary | ICD-10-CM

## 2024-07-21 LAB — HEMOGLOBIN A1C
Hgb A1c MFr Bld: 5.9 % — ABNORMAL HIGH (ref 4.8–5.6)
Mean Plasma Glucose: 122.63 mg/dL

## 2024-07-21 LAB — COMPREHENSIVE METABOLIC PANEL WITH GFR
ALT: 21 U/L (ref 0–44)
AST: 28 U/L (ref 15–41)
Albumin: 3.9 g/dL (ref 3.5–5.0)
Alkaline Phosphatase: 65 U/L (ref 38–126)
Anion gap: 12 (ref 5–15)
BUN: 32 mg/dL — ABNORMAL HIGH (ref 8–23)
CO2: 24 mmol/L (ref 22–32)
Calcium: 9.7 mg/dL (ref 8.9–10.3)
Chloride: 106 mmol/L (ref 98–111)
Creatinine, Ser: 1.22 mg/dL — ABNORMAL HIGH (ref 0.44–1.00)
GFR, Estimated: 50 mL/min — ABNORMAL LOW (ref >60)
Glucose, Bld: 136 mg/dL — ABNORMAL HIGH (ref 70–99)
Potassium: 4.2 mmol/L (ref 3.5–5.1)
Sodium: 142 mmol/L (ref 135–145)
Total Bilirubin: 0.7 mg/dL (ref 0.0–1.2)
Total Protein: 6.6 g/dL (ref 6.5–8.1)

## 2024-07-21 LAB — RESP PANEL BY RT-PCR (RSV, FLU A&B, COVID)  RVPGX2
Influenza A by PCR: NEGATIVE
Influenza B by PCR: NEGATIVE
Resp Syncytial Virus by PCR: NEGATIVE
SARS Coronavirus 2 by RT PCR: NEGATIVE

## 2024-07-21 LAB — CBC WITH DIFFERENTIAL/PLATELET
Abs Immature Granulocytes: 0.03 K/uL (ref 0.00–0.07)
Basophils Absolute: 0 K/uL (ref 0.0–0.1)
Basophils Relative: 0 %
Eosinophils Absolute: 0.2 K/uL (ref 0.0–0.5)
Eosinophils Relative: 3 %
HCT: 42.8 % (ref 36.0–46.0)
Hemoglobin: 13.9 g/dL (ref 12.0–15.0)
Immature Granulocytes: 0 %
Lymphocytes Relative: 18 %
Lymphs Abs: 1.5 K/uL (ref 0.7–4.0)
MCH: 30.6 pg (ref 26.0–34.0)
MCHC: 32.5 g/dL (ref 30.0–36.0)
MCV: 94.3 fL (ref 80.0–100.0)
Monocytes Absolute: 0.5 K/uL (ref 0.1–1.0)
Monocytes Relative: 6 %
Neutro Abs: 6.3 K/uL (ref 1.7–7.7)
Neutrophils Relative %: 73 %
Platelets: 170 K/uL (ref 150–400)
RBC: 4.54 MIL/uL (ref 3.87–5.11)
RDW: 12.3 % (ref 11.5–15.5)
WBC: 8.6 K/uL (ref 4.0–10.5)
nRBC: 0 % (ref 0.0–0.2)

## 2024-07-21 LAB — TROPONIN I (HIGH SENSITIVITY): Troponin I (High Sensitivity): 8 ng/L (ref <18)

## 2024-07-21 LAB — LACTIC ACID, PLASMA: Lactic Acid, Venous: 0.9 mmol/L (ref 0.5–1.9)

## 2024-07-21 LAB — GLUCOSE, CAPILLARY
Glucose-Capillary: 207 mg/dL — ABNORMAL HIGH (ref 70–99)
Glucose-Capillary: 99 mg/dL (ref 70–99)
Glucose-Capillary: 176 mg/dL — ABNORMAL HIGH (ref 70–99)

## 2024-07-21 LAB — LIPASE, BLOOD: Lipase: 33 U/L (ref 11–51)

## 2024-07-21 LAB — BRAIN NATRIURETIC PEPTIDE: B Natriuretic Peptide: 24 pg/mL (ref 0.0–100.0)

## 2024-07-21 LAB — BLOOD GAS, VENOUS

## 2024-07-21 MED ORDER — SODIUM CHLORIDE 0.9 % IV SOLN
1.0000 g | INTRAVENOUS | Status: DC
  Administered 2024-07-22 – 2024-07-23 (×2): 1 g via INTRAVENOUS
  Filled 2024-07-21 (×2): qty 10

## 2024-07-21 MED ORDER — CARVEDILOL 3.125 MG PO TABS
3.1250 mg | ORAL_TABLET | Freq: Two times a day (BID) | ORAL | Status: DC
  Administered 2024-07-21 – 2024-07-23 (×4): 3.125 mg via ORAL
  Filled 2024-07-21 (×4): qty 1

## 2024-07-21 MED ORDER — ENOXAPARIN SODIUM 40 MG/0.4ML IJ SOSY
40.0000 mg | PREFILLED_SYRINGE | INTRAMUSCULAR | Status: DC
  Administered 2024-07-21 – 2024-07-23 (×3): 40 mg via SUBCUTANEOUS
  Filled 2024-07-21 (×3): qty 0.4

## 2024-07-21 MED ORDER — ALUM & MAG HYDROXIDE-SIMETH 200-200-20 MG/5ML PO SUSP
30.0000 mL | Freq: Three times a day (TID) | ORAL | Status: DC
  Administered 2024-07-21 – 2024-07-23 (×8): 30 mL via ORAL
  Filled 2024-07-21 (×8): qty 30

## 2024-07-21 MED ORDER — BUDESON-GLYCOPYRROL-FORMOTEROL 160-9-4.8 MCG/ACT IN AERO
2.0000 | INHALATION_SPRAY | Freq: Two times a day (BID) | RESPIRATORY_TRACT | Status: DC
  Administered 2024-07-21 – 2024-07-23 (×4): 2 via RESPIRATORY_TRACT
  Filled 2024-07-21: qty 5.9

## 2024-07-21 MED ORDER — ACETAMINOPHEN 650 MG RE SUPP: 650.0000 mg | Freq: Four times a day (QID) | RECTAL | Status: DC | PRN

## 2024-07-21 MED ORDER — ROPINIROLE HCL 1 MG PO TABS
1.0000 mg | ORAL_TABLET | Freq: Every day | ORAL | Status: DC
  Administered 2024-07-21 – 2024-07-22 (×2): 1 mg via ORAL
  Filled 2024-07-21 (×2): qty 1

## 2024-07-21 MED ORDER — LEVOTHYROXINE SODIUM 112 MCG PO TABS
112.0000 ug | ORAL_TABLET | Freq: Every day | ORAL | Status: DC
  Administered 2024-07-22 – 2024-07-23 (×2): 112 ug via ORAL
  Filled 2024-07-21 (×2): qty 1

## 2024-07-21 MED ORDER — LORATADINE 10 MG PO TABS
10.0000 mg | ORAL_TABLET | Freq: Every day | ORAL | Status: AC
Start: 2024-07-21 — End: ?
  Administered 2024-07-21 – 2024-07-23 (×3): 10 mg via ORAL
  Filled 2024-07-21 (×3): qty 1

## 2024-07-21 MED ORDER — FLUTICASONE PROPIONATE 50 MCG/ACT NA SUSP
1.0000 | Freq: Every day | NASAL | Status: DC
  Administered 2024-07-22 – 2024-07-23 (×2): 1 via NASAL
  Filled 2024-07-21: qty 16

## 2024-07-21 MED ORDER — LAMOTRIGINE 25 MG PO TABS
150.0000 mg | ORAL_TABLET | Freq: Two times a day (BID) | ORAL | Status: DC
  Administered 2024-07-21 – 2024-07-23 (×4): 150 mg via ORAL
  Filled 2024-07-21 (×5): qty 2

## 2024-07-21 MED ORDER — NICOTINE 21 MG/24HR TD PT24
21.0000 mg | MEDICATED_PATCH | Freq: Every day | TRANSDERMAL | Status: DC
  Administered 2024-07-21 – 2024-07-23 (×3): 21 mg via TRANSDERMAL
  Filled 2024-07-21 (×3): qty 1

## 2024-07-21 MED ORDER — RISPERIDONE 3 MG PO TABS
3.0000 mg | ORAL_TABLET | Freq: Every day | ORAL | Status: DC
  Administered 2024-07-21 – 2024-07-22 (×2): 3 mg via ORAL
  Filled 2024-07-21 (×3): qty 1

## 2024-07-21 MED ORDER — ONDANSETRON HCL 4 MG PO TABS: 4.0000 mg | ORAL_TABLET | Freq: Three times a day (TID) | ORAL | Status: DC | PRN

## 2024-07-21 MED ORDER — BUDESON-GLYCOPYRROL-FORMOTEROL 160-9-4.8 MCG/ACT IN AERO: 2.0000 | INHALATION_SPRAY | Freq: Two times a day (BID) | RESPIRATORY_TRACT | Status: DC

## 2024-07-21 MED ORDER — IPRATROPIUM-ALBUTEROL 0.5-2.5 (3) MG/3ML IN SOLN
3.0000 mL | Freq: Once | RESPIRATORY_TRACT | Status: AC
  Administered 2024-07-21: 3 mL via RESPIRATORY_TRACT
  Filled 2024-07-21: qty 3

## 2024-07-21 MED ORDER — INSULIN ASPART 100 UNIT/ML IJ SOLN: 0.0000 [IU] | Freq: Every day | INTRAMUSCULAR | Status: DC

## 2024-07-21 MED ORDER — LEVETIRACETAM 250 MG PO TABS
250.0000 mg | ORAL_TABLET | Freq: Two times a day (BID) | ORAL | Status: DC
  Administered 2024-07-21 – 2024-07-23 (×4): 250 mg via ORAL
  Filled 2024-07-21 (×5): qty 1

## 2024-07-21 MED ORDER — IOHEXOL 350 MG/ML SOLN
75.0000 mL | Freq: Once | INTRAVENOUS | Status: AC | PRN
  Administered 2024-07-21: 75 mL via INTRAVENOUS

## 2024-07-21 MED ORDER — PRAVASTATIN SODIUM 20 MG PO TABS
40.0000 mg | ORAL_TABLET | Freq: Every day | ORAL | Status: DC
  Administered 2024-07-21 – 2024-07-23 (×3): 40 mg via ORAL
  Filled 2024-07-21 (×3): qty 2

## 2024-07-21 MED ORDER — FAMOTIDINE 20 MG PO TABS
20.0000 mg | ORAL_TABLET | Freq: Two times a day (BID) | ORAL | Status: DC
  Administered 2024-07-21 – 2024-07-23 (×4): 20 mg via ORAL
  Filled 2024-07-21 (×4): qty 1

## 2024-07-21 MED ORDER — SODIUM CHLORIDE 0.9 % IV SOLN
1.0000 g | INTRAVENOUS | Status: AC
  Administered 2024-07-21: 1 g via INTRAVENOUS
  Filled 2024-07-21: qty 10

## 2024-07-21 MED ORDER — BUSPIRONE HCL 10 MG PO TABS
30.0000 mg | ORAL_TABLET | Freq: Two times a day (BID) | ORAL | Status: DC
  Administered 2024-07-21 – 2024-07-23 (×4): 30 mg via ORAL
  Filled 2024-07-21: qty 3
  Filled 2024-07-21 (×2): qty 6
  Filled 2024-07-21 (×2): qty 3

## 2024-07-21 MED ORDER — FINERENONE 20 MG PO TABS: 1.0000 | ORAL_TABLET | Freq: Every day | ORAL | Status: DC

## 2024-07-21 MED ORDER — LITHIUM CARBONATE 150 MG PO CAPS
150.0000 mg | ORAL_CAPSULE | Freq: Every day | ORAL | Status: DC
  Administered 2024-07-21 – 2024-07-23 (×3): 150 mg via ORAL
  Filled 2024-07-21 (×3): qty 1

## 2024-07-21 MED ORDER — METFORMIN HCL 500 MG PO TABS
500.0000 mg | ORAL_TABLET | Freq: Two times a day (BID) | ORAL | Status: DC
  Administered 2024-07-21 – 2024-07-23 (×4): 500 mg via ORAL
  Filled 2024-07-21 (×4): qty 1

## 2024-07-21 MED ORDER — SODIUM CHLORIDE 0.9 % IV SOLN
500.0000 mg | Freq: Once | INTRAVENOUS | Status: AC
  Administered 2024-07-21: 500 mg via INTRAVENOUS
  Filled 2024-07-21: qty 5

## 2024-07-21 MED ORDER — AZITHROMYCIN 500 MG PO TABS
500.0000 mg | ORAL_TABLET | Freq: Every day | ORAL | Status: AC
Start: 2024-07-22 — End: ?
  Administered 2024-07-22 – 2024-07-23 (×2): 500 mg via ORAL
  Filled 2024-07-21 (×2): qty 1

## 2024-07-21 MED ORDER — ALBUTEROL SULFATE (2.5 MG/3ML) 0.083% IN NEBU
3.0000 mL | INHALATION_SOLUTION | Freq: Four times a day (QID) | RESPIRATORY_TRACT | Status: AC | PRN
Start: 2024-07-21 — End: ?

## 2024-07-21 MED ORDER — INSULIN ASPART 100 UNIT/ML IJ SOLN
0.0000 [IU] | Freq: Three times a day (TID) | INTRAMUSCULAR | Status: DC
  Administered 2024-07-21: 5 [IU] via SUBCUTANEOUS
  Administered 2024-07-22: 3 [IU] via SUBCUTANEOUS
  Administered 2024-07-22: 5 [IU] via SUBCUTANEOUS
  Filled 2024-07-21 (×3): qty 1

## 2024-07-21 MED ORDER — VALBENAZINE TOSYLATE 40 MG PO CAPS
80.0000 mg | ORAL_CAPSULE | Freq: Every day | ORAL | Status: DC
  Administered 2024-07-21 – 2024-07-22 (×2): 80 mg via ORAL
  Filled 2024-07-21 (×3): qty 2

## 2024-07-21 MED ORDER — ACETAMINOPHEN 325 MG PO TABS: 650.0000 mg | ORAL_TABLET | Freq: Four times a day (QID) | ORAL | Status: DC | PRN

## 2024-07-21 NOTE — ED Notes (Signed)
 Pts brief noted to be soiled in urine. This RN cleaned pt with bath wipes. New brief applied to pt. Pt tolerated well.   Pt remains on 2L [place].

## 2024-07-21 NOTE — Plan of Care (Signed)
 Patient is only oriented to self and very confused. Patient is unaware of safety precautions and repeats that she wants to leave. Educated was provided.   Problem: Education: Goal: Ability to describe self-care measures that may prevent or decrease complications (Diabetes Survival Skills Education) will improve Outcome: Not Progressing Goal: Individualized Educational Video(s) Outcome: Not Progressing   Problem: Coping: Goal: Ability to adjust to condition or change in health will improve Outcome: Not Progressing   Problem: Fluid Volume: Goal: Ability to maintain a balanced intake and output will improve Outcome: Not Progressing   Problem: Health Behavior/Discharge Planning: Goal: Ability to identify and utilize available resources and services will improve Outcome: Not Progressing Goal: Ability to manage health-related needs will improve Outcome: Not Progressing   Problem: Metabolic: Goal: Ability to maintain appropriate glucose levels will improve Outcome: Not Progressing   Problem: Nutritional: Goal: Maintenance of adequate nutrition will improve Outcome: Not Progressing Goal: Progress toward achieving an optimal weight will improve Outcome: Not Progressing   Problem: Skin Integrity: Goal: Risk for impaired skin integrity will decrease Outcome: Not Progressing   Problem: Tissue Perfusion: Goal: Adequacy of tissue perfusion will improve Outcome: Not Progressing   Problem: Education: Goal: Knowledge of General Education information will improve Description: Including pain rating scale, medication(s)/side effects and non-pharmacologic comfort measures Outcome: Not Progressing   Problem: Health Behavior/Discharge Planning: Goal: Ability to manage health-related needs will improve Outcome: Not Progressing   Problem: Clinical Measurements: Goal: Ability to maintain clinical measurements within normal limits will improve Outcome: Not Progressing Goal: Will remain free  from infection Outcome: Not Progressing Goal: Diagnostic test results will improve Outcome: Not Progressing Goal: Respiratory complications will improve Outcome: Not Progressing Goal: Cardiovascular complication will be avoided Outcome: Not Progressing   Problem: Activity: Goal: Risk for activity intolerance will decrease Outcome: Not Progressing   Problem: Nutrition: Goal: Adequate nutrition will be maintained Outcome: Not Progressing   Problem: Elimination: Goal: Will not experience complications related to bowel motility Outcome: Not Progressing Goal: Will not experience complications related to urinary retention Outcome: Not Progressing   Problem: Coping: Goal: Level of anxiety will decrease Outcome: Not Progressing   Problem: Pain Managment: Goal: General experience of comfort will improve and/or be controlled Outcome: Not Progressing   Problem: Skin Integrity: Goal: Risk for impaired skin integrity will decrease Outcome: Not Progressing   Problem: Safety: Goal: Ability to remain free from injury will improve Outcome: Not Progressing   Problem: Activity: Goal: Ability to tolerate increased activity will improve Outcome: Not Progressing   Problem: Clinical Measurements: Goal: Ability to maintain a body temperature in the normal range will improve Outcome: Not Progressing   Problem: Respiratory: Goal: Ability to maintain adequate ventilation will improve Outcome: Not Progressing Goal: Ability to maintain a clear airway will improve Outcome: Not Progressing   Problem: Education: Goal: Knowledge of disease or condition will improve Outcome: Not Progressing Goal: Knowledge of the prescribed therapeutic regimen will improve Outcome: Not Progressing Goal: Individualized Educational Video(s) Outcome: Not Progressing   Problem: Activity: Goal: Ability to tolerate increased activity will improve Outcome: Not Progressing   Problem: Respiratory: Goal:  Ability to maintain a clear airway will improve Outcome: Not Progressing Goal: Levels of oxygenation will improve Outcome: Not Progressing Goal: Ability to maintain adequate ventilation will improve Outcome: Not Progressing   Problem: Education: Goal: Knowledge of disease or condition will improve Outcome: Not Progressing Goal: Knowledge of the prescribed therapeutic regimen will improve Outcome: Not Progressing Goal: Individualized Educational Video(s) Outcome: Not Progressing  Problem: Respiratory: Goal: Ability to maintain a clear airway will improve Outcome: Not Progressing Goal: Levels of oxygenation will improve Outcome: Not Progressing Goal: Ability to maintain adequate ventilation will improve Outcome: Not Progressing

## 2024-07-21 NOTE — ED Notes (Signed)
 Fall risk bundle in place.

## 2024-07-21 NOTE — H&P (Signed)
 History and Physical    Tammy [name] FMW:[id number] DOB: Oct 19, 1960 DOA: 07/21/2024  PCP: Donal Channing SQUIBB, FNP (Confirm with patient/family/NH records and if not entered, this has to be entered at [facility] point of entry) Patient coming from: Assisted living  I have personally briefly reviewed patient's old medical records [facility]  Chief Complaint: Cough, shortness of breath  HPI: Tammy [name] is a 63 y.o. female with medical history significant of COPD with chronic hypoxic respiratory failure on 2 L continuously, IIDM, chronic HFpEF, seizure disorder, anxiety/depression, dementia, presented with worsening of cough and shortness of breath.  Patient reported been having dry cough and increasing shortness of breath since yesterday.  Denied any fever or chills, no chest pain, no abdominal pain no nausea vomiting or diarrhea.  This morning, she was found to be in respiratory distress and facility staff increased her oxygen to 6 L and called EMS.  ED Course: Afebrile, nontachycardic blood pressure 130/50 O2 saturation 88% on room air, stabilized on 6 L.  Nodular ground glass opacities in right upper and right lower lobes compatible with multifocal pneumonia.  Blood work showed WBC 8.6 hemoglobin 13.9 BUN 32 creatinine 1.2 bicarb 24.  Lactic acid 0.9.  Patient was given ceftriaxone  and azithromycin  in the ED.  Review of Systems: As per HPI otherwise 14 point review of systems negative.    Past Medical History:  Diagnosis Date   Anxiety    Asthma    Bipolar 1 disorder (HCC)    CHF (congestive heart failure) (HCC)    COPD (chronic obstructive pulmonary disease) (HCC)    Dementia (HCC)    Diabetes mellitus without complication (HCC)    Epilepsy (HCC)    Seizures (HCC)     Past Surgical History:  Procedure Laterality Date   APPENDECTOMY     hernia reapir     x 3       reports that she quit smoking about 25 years ago. Her smoking use included cigarettes. She has  never used smokeless tobacco. She reports that she does not drink alcohol and does not use drugs.  Allergies  Allergen Reactions   Penicillins Rash    Has patient had a PCN reaction causing immediate rash, facial/tongue/throat swelling, SOB or lightheadedness with hypotension: No Has patient had a PCN reaction causing severe rash involving mucus membranes or skin necrosis: No Has patient had a PCN reaction that required hospitalization: No Has patient had a PCN reaction occurring within the last 10 years: No If all of the above answers are NO, then may proceed with Cephalosporin use.    Prednisone  Rash    Family History  Problem Relation Age of Onset   Hypertension Mother    Kidney cancer Neg Hx    Bladder Cancer Neg Hx    Breast cancer Neg Hx      Prior to Admission medications   Medication Sig Start Date End Date Taking? Authorizing Provider  albuterol  (VENTOLIN  HFA) 108 (90 Base) MCG/ACT inhaler Inhale 1-2 puffs into the lungs every 6 (six) hours as needed for wheezing or shortness of breath.    [provider]  aluminum -magnesium  hydroxide-simethicone  (MAALOX) 200-200-20 MG/5ML SUSP Take 30 mLs by mouth 4 (four) times daily -  before meals and at bedtime. 09/07/23   Viviann Pastor, MD  busPIRone  (BUSPAR ) 30 MG tablet Take 30 mg by mouth 2 (two) times daily.     [provider]  carvedilol  (COREG ) 3.125 MG tablet Take 3.125 mg  by mouth 2 (two) times daily with a meal.     [provider]  cetirizine (ZYRTEC) 10 MG tablet Take 10 mg by mouth daily.    [provider]  Cyanocobalamin  1000 MCG SUBL Place 1 tablet (1,000 mcg total) under the tongue daily. 07/01/19   Brahmanday, Govinda R, MD  empagliflozin  (JARDIANCE ) 25 MG TABS tablet Take 25 mg by mouth daily.     [provider]  famotidine  (PEPCID ) 20 MG tablet Take 1 tablet (20 mg total) by mouth 2 (two) times daily. 09/07/23   Viviann Pastor, MD  ferrous sulfate  325 (65 FE) MG EC  tablet Take 1 tablet (325 mg total) by mouth daily with breakfast. 08/20/19   Brahmanday, Govinda R, MD  fluticasone  (FLONASE ) 50 MCG/ACT nasal spray Place 1 spray into both nostrils daily.     [provider]  KERENDIA  20 MG TABS Take 1 tablet by mouth daily.    [provider]  lamoTRIgine  (LAMICTAL ) 150 MG tablet Take 150 mg by mouth 2 (two) times daily.    [provider]  levETIRAcetam  (KEPPRA ) 250 MG tablet Take 250 mg by mouth 2 (two) times daily.    [provider]  levothyroxine  (SYNTHROID ) 112 MCG tablet Take 112 mcg by mouth daily before breakfast.    [provider]  lithium  carbonate 150 MG capsule Take 150 mg by mouth daily.     [provider]  metFORMIN (GLUCOPHAGE) 500 MG tablet Take 500 mg by mouth 2 (two) times daily with a meal.    [provider]  nicotine  (NICODERM CQ  - DOSED IN MG/24 HOURS) 21 mg/24hr patch Place 1 patch (21 mg total) onto the skin daily. 08/17/23   Barbarann Nest, MD  Omega-3 Fatty Acids (FISH OIL) 1000 MG CAPS Take 1,000 mg by mouth 2 (two) times daily.     [provider]  ondansetron  (ZOFRAN ) 4 MG tablet Take 1 tablet (4 mg total) by mouth every 8 (eight) hours as needed for nausea or vomiting. 09/06/23   Cyrena Mylar, MD  pravastatin  (PRAVACHOL ) 40 MG tablet Take 40 mg by mouth daily.    [provider]  risperiDONE  (RISPERDAL ) 3 MG tablet Take 3 mg by mouth at bedtime.     [provider]  risperiDONE  ER (UZEDY ) 75 MG/0.21ML SUSY Inject 75 mg into the skin every 30 (thirty) days.    [provider]  rOPINIRole  (REQUIP ) 1 MG tablet Take 1 mg by mouth at bedtime.    [provider]  TRELEGY ELLIPTA 100-62.5-25 MCG/INH AEPB Inhale 1 puff into the lungs daily.  11/17/18   [provider]  Vitamin D , Ergocalciferol , (DRISDOL ) 1.25 MG (50000 UT) CAPS capsule Take 50,000 Units by mouth every 7 (seven) days.    [provider]     Physical Exam: Vitals:   07/21/24 0545 07/21/24 0615 07/21/24 0630 07/21/24 0941  BP:   (!) 142/73   Pulse: 80 90 85   Resp: 19  16   Temp:    98.4 F (36.9 C)  TempSrc:    Oral  SpO2: 90% 93% 92%   Weight:      Height:        Constitutional: NAD, calm, comfortable Vitals:   07/21/24 0545 07/21/24 0615 07/21/24 0630 07/21/24 0941  BP:   (!) 142/73   Pulse: 80 90 85   Resp: 19  16   Temp:    98.4 F (36.9 C)  TempSrc:  Oral  SpO2: 90% 93% 92%   Weight:      Height:       Eyes: PERRL, lids and conjunctivae normal ENMT: Mucous membranes are moist. Posterior pharynx clear of any exudate or lesions.Normal dentition.  Neck: normal, supple, no masses, no thyromegaly Respiratory: clear to auscultation bilaterally, right-sided crackles, scattered wheezing bilaterally, increasing respiratory effort. No accessory muscle use.  Cardiovascular: Regular rate and rhythm, no murmurs / rubs / gallops. No extremity edema. 2+ pedal pulses. No carotid bruits.  Abdomen: no tenderness, no masses palpated. No hepatosplenomegaly. Bowel sounds positive.  Musculoskeletal: no clubbing / cyanosis. No joint deformity upper and lower extremities. Good ROM, no contractures. Normal muscle tone.  Skin: no rashes, lesions, ulcers. No induration Neurologic: CN 2-12 grossly intact. Sensation intact, DTR normal. Strength 5/5 in all 4.  Psychiatric: Normal judgment and insight. Alert and oriented x 3. Normal mood.     Labs on Admission: I have personally reviewed following labs and imaging studies  CBC: Recent Labs  Lab 07/21/24 0347  WBC 8.6  NEUTROABS 6.3  HGB 13.9  HCT 42.8  MCV 94.3  PLT 170   Basic Metabolic Panel: Recent Labs  Lab 07/21/24 0347  NA 142  K 4.2  CL 106  CO2 24  GLUCOSE 136*  BUN 32*  CREATININE 1.22*  CALCIUM  9.7   GFR: Estimated Creatinine Clearance: 42.5 mL/min (A) (by C-G formula based on SCr of 1.22 mg/dL (H)). Liver Function Tests: Recent Labs  Lab  07/21/24 0347  AST 28  ALT 21  ALKPHOS 65  BILITOT 0.7  PROT 6.6  ALBUMIN 3.9   Recent Labs  Lab 07/21/24 0347  LIPASE 33   No results for input(s): AMMONIA in the last 168 hours. Coagulation Profile: No results for input(s): INR, PROTIME in the last 168 hours. Cardiac Enzymes: No results for input(s): CKTOTAL, CKMB, CKMBINDEX, TROPONINI in the last 168 hours. BNP (last 3 results) No results for input(s): PROBNP in the last 8760 hours. HbA1C: No results for input(s): HGBA1C in the last 72 hours. CBG: No results for input(s): GLUCAP in the last 168 hours. Lipid Profile: No results for input(s): CHOL, HDL, LDLCALC, TRIG, CHOLHDL, LDLDIRECT in the last 72 hours. Thyroid  Function Tests: No results for input(s): TSH, T4TOTAL, FREET4, T3FREE, THYROIDAB in the last 72 hours. Anemia Panel: No results for input(s): VITAMINB12, FOLATE, FERRITIN, TIBC, IRON, RETICCTPCT in the last 72 hours. Urine analysis:    Component Value Date/Time   COLORURINE YELLOW (A) 08/15/2023 0211   APPEARANCEUR HAZY (A) 08/15/2023 0211   LABSPEC 1.013 08/15/2023 0211   PHURINE 5.0 08/15/2023 0211   GLUCOSEU >=500 (A) 08/15/2023 0211   HGBUR SMALL (A) 08/15/2023 0211   BILIRUBINUR negative 10/10/2023 1750   KETONESUR negative 10/10/2023 1750   KETONESUR NEGATIVE 08/15/2023 0211   PROTEINUR negative 10/10/2023 1750   PROTEINUR NEGATIVE 08/15/2023 0211   UROBILINOGEN 0.2 10/10/2023 1750   NITRITE Negative 10/10/2023 1750   NITRITE NEGATIVE 08/15/2023 0211   LEUKOCYTESUR Small (1+) (A) 10/10/2023 1750   LEUKOCYTESUR NEGATIVE 08/15/2023 0211    Radiological Exams on Admission: CT Angio Chest PE W/Cm &/Or Wo Cm Result Date: 07/21/2024 CLINICAL DATA:  Shortness of breath started this morning. Clinical concern for pulmonary embolus. EXAM: CT ANGIOGRAPHY CHEST WITH CONTRAST TECHNIQUE: Multidetector CT imaging of the chest was performed using the  standard protocol during bolus administration of intravenous contrast. Multiplanar CT image reconstructions and MIPs were obtained to evaluate the vascular anatomy. RADIATION DOSE REDUCTION:  This exam was performed according to the departmental dose-optimization program which includes automated exposure control, adjustment of the mA and/or kV according to patient size and/or use of iterative reconstruction technique. CONTRAST:  75mL OMNIPAQUE  IOHEXOL  350 MG/ML SOLN COMPARISON:  08/15/2023 FINDINGS: Cardiovascular: The heart size is upper normal to borderline enlarged. Coronary artery calcification is evident. Moderate atherosclerotic calcification is noted in the wall of the thoracic aorta. Enlargement of the pulmonary outflow tract/main pulmonary arteries suggests pulmonary arterial hypertension. There is no filling defect within the opacified pulmonary arteries to suggest the presence of an acute pulmonary embolus. Mediastinum/Nodes: Similar mediastinal lymphadenopathy including low 14 mm short axis right paratracheal node. There is no hilar lymphadenopathy. The esophagus has normal imaging features. There is no axillary lymphadenopathy. Lungs/Pleura: Interlobular septal thickening noted in the upper lungs with interval development of patchy and nodular ground-glass opacity in the right upper and lower lobes. Areas of confluent nodularity are seen in the posterior right lower lobe measuring up to 9 mm (77/5). No substantial pleural effusion. Upper Abdomen: Stable nodular thickening of the adrenal glands, incompletely visualized and better characterized by CT abdomen 09/06/2023. Stable borderline to mild gastrohepatic and hepatoduodenal ligament lymphadenopathy. Surgical changes are noted in the stomach. No worrisome lytic or sclerotic osseous abnormality. Musculoskeletal: No worrisome lytic or sclerotic osseous abnormality. Review of the MIP images confirms the above findings. IMPRESSION: 1. No CT evidence for  acute pulmonary embolus. 2. Interlobular septal thickening in the upper lungs suggest edema. 3. Interval development of patchy and nodular ground-glass opacity in the right upper and lower lobes. Imaging features are compatible with multifocal pneumonia. 4. Areas of confluent nodularity are seen in the posterior right lower lobe measuring up to 9 mm. These are likely related to the infectious/inflammatory process but require follow-up to ensure resolution. 5. Stable mediastinal lymphadenopathy, potentially reactive. Attention on follow-up recommended. 6. Enlargement of the pulmonary outflow tract/main pulmonary arteries suggests pulmonary arterial hypertension. 7. Stable borderline to mild gastrohepatic and hepatoduodenal ligament lymphadenopathy. Attention on follow-up recommended. 8.  Aortic Atherosclerosis (ICD10-I70.0). Electronically Signed   By: Camellia Candle M.D.   On: 07/21/2024 06:52   DG Chest Portable 1 View Result Date: 07/21/2024 EXAM: 1 VIEW(S) XRAY OF THE CHEST 07/21/2024 04:51:00 AM COMPARISON: 12/10/2023 CLINICAL HISTORY: acute dyspnea, hx of CHF and COPD. Acute dyspnea hx of CHF and COPD FINDINGS: LUNGS AND PLEURA: No focal pulmonary opacity. No pulmonary edema. Possible small left pleural effusion. No pneumothorax. HEART AND MEDIASTINUM: Calcified aortic atherosclerosis. BONES AND SOFT TISSUES: No acute osseous abnormality. ABDOMEN: Paucity of bowel gas. IMPRESSION: 1. Possible small left pleural effusion. Electronically signed by: Evalene Coho MD 07/21/2024 05:10 AM EDT RP Workstation: [id number]    EKG: Independently reviewed.  Sinus rhythm, no acute ST changes.  Assessment/Plan Principal Problem:   PNA (pneumonia) Active Problems:   COPD with acute exacerbation (HCC)   CAP (community acquired pneumonia)  (please populate well all problems here in Problem List. (For example, if patient is on BP meds at home and you resume or decide to hold them, it is a problem that needs to  be her. Same for CAD, COPD, HLD and so on)  Acute on chronic hypoxic respiratory failure - Probably multifactorial from multifocal pneumonia on the right side as well as acute COPD exacerbation - Can continue nasal cannula to titrate oxygen  Multifocal pneumonia - Continue ceftriaxone  and azithromycin  -Other DDx: Probably viral pneumonia as revealed history showed that patient was evaluated by speech therapist on recent  admission, and her diet was downgraded to dysphagia 1. - Breathing medications - Incentive spirometry and flutter valve  Acute COPD exacerbation - P.o. prednisone  - Patient appears to have a  allergy to steroid - Continue DuoNebs and as needed albuterol   History of dysphagia - Continue dysphagia 1 diet  IIDM -SSI for now - Hold off metformin as patient received IV contrast today  GERD - Continue Pepcid   HTN - Continue Coreg   HLD Continue statin-  Dementia - Mentation at baseline  DVT prophylaxis: Lovenox  Code Status: Full code Family Communication: None at bedside Disposition Plan: Expect less than 2 midnight hospital stay Consults called: None Admission status: Telemetry observation   Cort ONEIDA Mana MD [facility] Pager [phone]  07/21/2024, 10:02 AM

## 2024-07-21 NOTE — ED Notes (Signed)
 Pt continously attempting to get out of bed. Pt readjusted in bed and reoriented to situation.

## 2024-07-21 NOTE — ED Notes (Signed)
 Pt continues to attempt to slide down in bed. Pt repositioned with legs elevated from bed and resting on a pillow. Pt brief noted to be soiled and new brief placed on pt at this time. Previous purewick catheter removed.

## 2024-07-21 NOTE — ED Triage Notes (Addendum)
 Pt to ED by EMS from a facility, [facility].   Per facility, pt's O2 was low, pt wears 2L O2 at baseline. Hx of CHF and COPD. Pt on 6L O2 upon arrival to ED. Hx of dementia. Pt reports SOB that began this morning.

## 2024-07-21 NOTE — ED Provider Notes (Signed)
 [facility] Provider Note    Event Date/Time   First MD Initiated Contact with Patient 07/21/24 0340     (approximate)   History   Shortness of Breath  Level 5 caveat: History is limited by the patient's dementia and acute presentation.  HPI Tammy [name] is a 63 y.o. female who reportedly has a history that includes dementia, CHF, COPD, chronic oxygen supplementation on 2 L nasal cannula at baseline, bipolar mood disorder, diabetes, and seizure-like activity.  She presents from her facility reportedly having acute shortness of breath.  The patient states she has felt short of breath since this morning and it is steadily gotten worse.  The facility called paramedics and the patient was increased from 2 L of oxygen to 6 L.  No report was given regarding the initial SpO2 measurements.  The patient said she still feels short of breath upon arrival but it was worse earlier.  She denies any pain including chest pain and abdominal pain.  She has had no nausea or vomiting recently.  She has not had any swelling in her legs.     Physical Exam   Triage Vital Signs: ED Triage Vitals  Encounter Vitals Group     BP 07/21/24 0341 (!) 133/56     Girls Systolic BP Percentile --      Girls Diastolic BP Percentile --      Boys Systolic BP Percentile --      Boys Diastolic BP Percentile --      Pulse Rate 07/21/24 0341 74     Resp 07/21/24 0341 18     Temp 07/21/24 0341 98.2 F (36.8 C)     Temp Source 07/21/24 0341 Oral     SpO2 07/21/24 0341 95 %     Weight 07/21/24 0339 59 kg (130 lb)     Height 07/21/24 0339 1.651 m (5' 5)     Head Circumference --      Peak Flow --      Pain Score 07/21/24 0339 0     Pain Loc --      Pain Education --      Exclude from Growth Chart --     Most recent vital signs: Vitals:   07/21/24 0341  BP: (!) 133/56  Pulse: 74  Resp: 18  Temp: 98.2 F (36.8 C)  SpO2: 95%    General: Awake, some mild respiratory distress  but mostly exhibited through tachypnea. CV:  Good peripheral perfusion.  Regular rate and rhythm, normal heart sounds. Resp:  Normal effort but accelerated rate. Speaking easily and comfortably, no accessory muscle usage nor intercostal retractions.  Lungs are clear to auscultation with no audible crackles or wheezing. Abd:  No distention.  No tenderness to palpation.   ED Results / Procedures / Treatments   Labs (all labs ordered are listed, but only abnormal results are displayed) Labs Reviewed - No data to display   EKG  ED ECG REPORT I, Darleene Dome, the attending physician, personally viewed and interpreted this ECG.  Date: 07/21/2024 EKG Time: 3:41 AM Rate: 71 Rhythm: normal sinus rhythm QRS Axis: normal Intervals: normal ST/T Wave abnormalities: Non-specific ST segment / T-wave changes, but no clear evidence of acute ischemia. Narrative Interpretation: no definitive evidence of acute ischemia; does not meet STEMI criteria.    RADIOLOGY See ED course for details   PROCEDURES:  Critical Care performed: Yes, see critical care procedure note(s)  .1-3 Lead EKG Interpretation  Performed  by: Gordan Huxley, MD Authorized by: Gordan Huxley, MD     Interpretation: normal     ECG rate:  75   ECG rate assessment: normal     Rhythm: sinus rhythm     Ectopy: none     Conduction: normal   .Critical Care  Performed by: Gordan Huxley, MD Authorized by: Gordan Huxley, MD   Critical care provider statement:    Critical care time (minutes):  30   Critical care time was exclusive of:  Separately billable procedures and treating other patients   Critical care was necessary to treat or prevent imminent or life-threatening deterioration of the following conditions:  Respiratory failure   Critical care was time spent personally by me on the following activities:  Development of treatment plan with patient or surrogate, evaluation of patient's response to treatment, examination of  patient, obtaining history from patient or surrogate, ordering and performing treatments and interventions, ordering and review of laboratory studies, ordering and review of radiographic studies, pulse oximetry, re-evaluation of patient's condition and review of old charts     IMPRESSION / MDM / ASSESSMENT AND PLAN / ED COURSE  I reviewed the triage vital signs and the nursing notes.                              Differential diagnosis includes, but is not limited to, CHF exacerbation, COPD exacerbation, pneumonia, pneumothorax, ACS, PE.  Patient's presentation is most consistent with acute presentation with potential threat to life or bodily function.  Labs/studies ordered: EKG, chest x-ray, lactic acid, CBC with differential, BNP, CMP, respiratory viral panel, high sensitive troponin, lipase, VBG  Interventions/Medications given:  Medications  ipratropium-albuterol  (DUONEB) 0.5-2.5 (3) MG/3ML nebulizer solution 3 mL (has no administration in time range)    (Note:  hospital course my include additional interventions and/or labs/studies not listed above.)   Labs notable for VBG of pO2 less than 31.  However, I turned her oxygen down to 2 L nasal cannula and she is maintaining an SpO2 of 95%.  BNP is normal, lactic acid is normal, high-sensitivity troponin is normal.  Patient has very slightly increased creatinine over baseline but essentially normal.  No leukocytosis, no fever, no tachycardia.  Awaiting results of chest x-ray, ordered 1 DuoNeb, will continue to evaluate  The patient is on the cardiac monitor to evaluate for evidence of arrhythmia and/or significant heart rate changes.   Clinical Course as of 07/21/24 0707  Wed Jul 21, 2024  0527 DG Chest Portable 1 View I independently viewed and interpreted the patient's chest x-ray(s), and I also reviewed the radiologist's report.  I do not appreciate any pneumonia or pulmonary edema.  Radiologist commented on possible small left  pleural effusion.  Given that the patient's presentation and reported dyspnea cannot be explained by the patient's x-ray nor labs, I am going to further assess with a CTA chest to rule out PE as well as to better evaluate the lung parenchyma. [CF]  0600 Resp panel by RT-PCR (RSV, Flu A&B, Covid) Anterior Nasal Swab Negative resp viral panel [CF]  0655 CT Angio Chest PE W/Cm &/Or Wo Cm I dependently viewed and interpreted patient's CTA chest.  No pulmonary embolism.  However there are extensive changes in the lung parenchyma bilaterally which are suggestive of multifocal pneumonia.  The radiologist also commented on the presence of what appears to be pulmonary edema in the upper lobes.  The patient does  not meet sepsis criteria and I will not initiate aggressive fluid rehydration, but I will treat with broad-spectrum antibiotics and consult the hospitalist team for admission given her increased oxygen requirement and initial respiratory distress and multifocal pneumonia and pulmonary edema on CT scan. [CF]  9343 I am consulting the hospitalist team for admission.   [CF]  [phone] Transferring ED care to Dr. Arlander to consult with the hospitalist.  Nurses informed me that the patient was dropping down to the 80s on her chronic 2 L, so she is now on 4 L of oxygen by nasal cannula. [CF]    Clinical Course User Index [CF] Gordan Huxley, MD     FINAL CLINICAL IMPRESSION(S) / ED DIAGNOSES   Final diagnoses:  None     Rx / DC Orders   ED Discharge Orders     None        Note:  This document was prepared using Dragon voice recognition software and may include unintentional dictation errors.   Gordan Huxley, MD 07/21/24 [phone]

## 2024-07-21 NOTE — ED Notes (Signed)
 Pt noted to be maintaining 88-89% on 2L [place]. Pt was placed on 3L with an increase to 90-91%. Pt placed on 4L [place] and had an increase to 94%. Dr. Gordan made aware.

## 2024-07-22 DIAGNOSIS — G40909 Epilepsy, unspecified, not intractable, without status epilepticus: Secondary | ICD-10-CM | POA: Diagnosis present

## 2024-07-22 DIAGNOSIS — Z7984 Long term (current) use of oral hypoglycemic drugs: Secondary | ICD-10-CM | POA: Diagnosis not present

## 2024-07-22 DIAGNOSIS — R1310 Dysphagia, unspecified: Secondary | ICD-10-CM | POA: Diagnosis present

## 2024-07-22 DIAGNOSIS — Z1152 Encounter for screening for COVID-19: Secondary | ICD-10-CM | POA: Diagnosis not present

## 2024-07-22 DIAGNOSIS — I110 Hypertensive heart disease with heart failure: Secondary | ICD-10-CM | POA: Diagnosis present

## 2024-07-22 DIAGNOSIS — J9621 Acute and chronic respiratory failure with hypoxia: Secondary | ICD-10-CM | POA: Diagnosis present

## 2024-07-22 DIAGNOSIS — Z79890 Hormone replacement therapy (postmenopausal): Secondary | ICD-10-CM | POA: Diagnosis not present

## 2024-07-22 DIAGNOSIS — Z888 Allergy status to other drugs, medicaments and biological substances status: Secondary | ICD-10-CM | POA: Diagnosis not present

## 2024-07-22 DIAGNOSIS — J441 Chronic obstructive pulmonary disease with (acute) exacerbation: Secondary | ICD-10-CM

## 2024-07-22 DIAGNOSIS — K5900 Constipation, unspecified: Secondary | ICD-10-CM | POA: Diagnosis present

## 2024-07-22 DIAGNOSIS — Z5320 Procedure and treatment not carried out because of patient's decision for unspecified reasons: Secondary | ICD-10-CM | POA: Diagnosis not present

## 2024-07-22 DIAGNOSIS — E119 Type 2 diabetes mellitus without complications: Secondary | ICD-10-CM | POA: Diagnosis present

## 2024-07-22 DIAGNOSIS — E785 Hyperlipidemia, unspecified: Secondary | ICD-10-CM | POA: Diagnosis present

## 2024-07-22 DIAGNOSIS — Z87891 Personal history of nicotine dependence: Secondary | ICD-10-CM | POA: Diagnosis not present

## 2024-07-22 DIAGNOSIS — F0394 Unspecified dementia, unspecified severity, with anxiety: Secondary | ICD-10-CM | POA: Diagnosis present

## 2024-07-22 DIAGNOSIS — F0393 Unspecified dementia, unspecified severity, with mood disturbance: Secondary | ICD-10-CM | POA: Diagnosis present

## 2024-07-22 DIAGNOSIS — Z880 Allergy status to penicillin: Secondary | ICD-10-CM | POA: Diagnosis not present

## 2024-07-22 DIAGNOSIS — I5032 Chronic diastolic (congestive) heart failure: Secondary | ICD-10-CM | POA: Diagnosis present

## 2024-07-22 DIAGNOSIS — Z8249 Family history of ischemic heart disease and other diseases of the circulatory system: Secondary | ICD-10-CM | POA: Diagnosis not present

## 2024-07-22 DIAGNOSIS — Z9981 Dependence on supplemental oxygen: Secondary | ICD-10-CM | POA: Diagnosis not present

## 2024-07-22 DIAGNOSIS — F319 Bipolar disorder, unspecified: Secondary | ICD-10-CM | POA: Diagnosis present

## 2024-07-22 DIAGNOSIS — J189 Pneumonia, unspecified organism: Secondary | ICD-10-CM | POA: Diagnosis present

## 2024-07-22 DIAGNOSIS — J440 Chronic obstructive pulmonary disease with acute lower respiratory infection: Secondary | ICD-10-CM | POA: Diagnosis present

## 2024-07-22 DIAGNOSIS — K219 Gastro-esophageal reflux disease without esophagitis: Secondary | ICD-10-CM | POA: Diagnosis present

## 2024-07-22 LAB — CBC
HCT: 41.3 % (ref 36.0–46.0)
Hemoglobin: 13.5 g/dL (ref 12.0–15.0)
MCH: 31.3 pg (ref 26.0–34.0)
MCHC: 32.7 g/dL (ref 30.0–36.0)
MCV: 95.6 fL (ref 80.0–100.0)
Platelets: 155 K/uL (ref 150–400)
RBC: 4.32 MIL/uL (ref 3.87–5.11)
RDW: 12.2 % (ref 11.5–15.5)
WBC: 11 K/uL — ABNORMAL HIGH (ref 4.0–10.5)
nRBC: 0.3 % — ABNORMAL HIGH (ref 0.0–0.2)

## 2024-07-22 LAB — BASIC METABOLIC PANEL WITH GFR
Anion gap: 10 (ref 5–15)
BUN: 31 mg/dL — ABNORMAL HIGH (ref 8–23)
CO2: 24 mmol/L (ref 22–32)
Calcium: 9.5 mg/dL (ref 8.9–10.3)
Chloride: 108 mmol/L (ref 98–111)
Creatinine, Ser: 0.88 mg/dL (ref 0.44–1.00)
GFR, Estimated: >60 mL/min (ref >60)
Glucose, Bld: 136 mg/dL — ABNORMAL HIGH (ref 70–99)
Potassium: 4.5 mmol/L (ref 3.5–5.1)
Sodium: 142 mmol/L (ref 135–145)

## 2024-07-22 LAB — GLUCOSE, CAPILLARY
Glucose-Capillary: 106 mg/dL — ABNORMAL HIGH (ref 70–99)
Glucose-Capillary: 216 mg/dL — ABNORMAL HIGH (ref 70–99)
Glucose-Capillary: 167 mg/dL — ABNORMAL HIGH (ref 70–99)
Glucose-Capillary: 143 mg/dL — ABNORMAL HIGH (ref 70–99)

## 2024-07-22 MED ORDER — LACTULOSE 10 GM/15ML PO SOLN
10.0000 g | Freq: Two times a day (BID) | ORAL | Status: DC
  Administered 2024-07-22 – 2024-07-23 (×2): 10 g via ORAL
  Filled 2024-07-22 (×2): qty 30

## 2024-07-22 NOTE — Progress Notes (Signed)
 Unable to administer Finerenone  d/t it not being stocked in hospital, pharmacy request it be sent from facility. I called Marinda at [facility] and left a message.

## 2024-07-22 NOTE — Progress Notes (Signed)
 PROGRESS NOTE    Tammy [name]  FMW:[id number] DOB: Feb 12, 1961 DOA: 07/21/2024 PCP: Donal Channing SQUIBB, FNP  Chief Complaint  Patient presents with   Shortness of Breath    Hospital Course:  Tammy [name] is a 63 year old female with COPD, chronic hypoxic respiratory failure on 2 L, diabetes, chronic heart failure preserved EF, seizure disorder, anxiety, depression, dementia, who presented with cough and shortness of breath.  On EMS arrival she was found to be in respiratory distress requiring 6 L O2 to stabilize near 88%.  She was otherwise afebrile and nontachycardic.  CXR revealed nodular ground glass opacities in the right upper and lower lobes compatible with multifocal pneumonia.  She was admitted and started on antibiotics   Subjective: Remains confused this morning.  Requires significant prompting.  She is complaining of some abdominal pain.  Has not had a bowel movement.  Also endorsed that she needs to urinate.  Bedside RN reports she has had no difficulty with urinating but denies bowel movements today.   Objective: Vitals:   07/21/24 1955 07/21/24 2100 07/22/24 0351 07/22/24 0733  BP: 125/68  (!) 94/58 (!) 116/97  Pulse: 77  69 75  Resp: 16  16 18   Temp: 99 F (37.2 C)  98.8 F (37.1 C) 98.3 F (36.8 C)  TempSrc: Oral  Oral   SpO2: 90% 99% 99% 99%  Weight:      Height:       No intake or output data in the 24 hours ending 07/22/24 0809 Filed Weights   07/21/24 0339  Weight: 59 kg    Examination: General exam: Appears calm and comfortable, NAD  Respiratory system: No work of breathing, symmetric chest wall expansion Cardiovascular system: S1 & S2 heard, RRR.  Gastrointestinal system: Abdomen has palpable mass in pelvic region, may be stool, not tender to palpation. Neuro: Alert and pleasantly confused.  Assessment & Plan:  Principal Problem:   PNA (pneumonia) Active Problems:   COPD with acute exacerbation (HCC)   CAP (community acquired  pneumonia)    Acute on chronic hypoxic respiratory failure - At baseline patient requires 2 L O2.  She was tachypneic and desaturating requiring 6 L for stabilization on arrival - Secondary to multifocal pneumonia and COPD exacerbation - Wean oxygen as able  Abdominal pain - Patient complaining of abdominal pain though does not appear tender on palpation.  She does appear to have a masslike protrusion in her right lower quadrant and pelvis.  This may be high stool burden.  No abdominal imaging on arrival.  CT from 2024 reviewed without abnormality.  Will initiate bowel regimen and monitor for resolution.  If not we will proceed with CT scan  Multifocal pneumonia - Currently on ceftriaxone  azithromycin , continue - Some component of dysphagia may also be at play.  SLP eval on arrival.  On dysphagia 1 diet - Continue with DuoNebs - Encourage I-S and FV  Acute COPD exacerbation - On steroid therapy - On betrezi - Continue as needed DuoNebs  History of dysphagia - Dysphagia 1 diet  Type 2 diabetes - Sliding scale insulin  for now - Hold metformin while admitted.  Patient is also on Jardiance  at home  GERD - Continue Pepcid   Hypertension - Continue home meds  Hyperlipidemia - Continue statin  Dementia - Patient appears to be at her mental status baseline.  Resume home meds. - Obtain PT/OT evals  DVT prophylaxis: lovenox    Code Status: Full Code Disposition: Likely return to group home  within next 48 hours.  Still with increased O2 needs at this time.  Consultants:    Procedures:    Antimicrobials:  Anti-infectives (From admission, onward)    Start     Dose/Rate Route Frequency Ordered Stop   07/22/24 1000  azithromycin  (ZITHROMAX ) tablet 500 mg        500 mg Oral Daily 07/21/24 0851     07/22/24 0800  cefTRIAXone  (ROCEPHIN ) 1 g in sodium chloride  0.9 % 100 mL IVPB        1 g 200 mL/hr over 30 Minutes Intravenous Every 24 hours 07/21/24 0851     07/21/24 0715   cefTRIAXone  (ROCEPHIN ) 1 g in sodium chloride  0.9 % 100 mL IVPB        1 g 200 mL/hr over 30 Minutes Intravenous STAT 07/21/24 0701 07/21/24 0935   07/21/24 0715  azithromycin  (ZITHROMAX ) 500 mg in sodium chloride  0.9 % 250 mL IVPB        500 mg 250 mL/hr over 60 Minutes Intravenous  Once 07/21/24 0701 07/21/24 1128       Data Reviewed: I have personally reviewed following labs and imaging studies CBC: Recent Labs  Lab 07/21/24 0347 07/22/24 0357  WBC 8.6 11.0*  NEUTROABS 6.3  --   HGB 13.9 13.5  HCT 42.8 41.3  MCV 94.3 95.6  PLT 170 155   Basic Metabolic Panel: Recent Labs  Lab 07/21/24 0347 07/22/24 0357  NA 142 142  K 4.2 4.5  CL 106 108  CO2 24 24  GLUCOSE 136* 136*  BUN 32* 31*  CREATININE 1.22* 0.88  CALCIUM  9.7 9.5   GFR: Estimated Creatinine Clearance: 58.9 mL/min (by C-G formula based on SCr of 0.88 mg/dL). Liver Function Tests: Recent Labs  Lab 07/21/24 0347  AST 28  ALT 21  ALKPHOS 65  BILITOT 0.7  PROT 6.6  ALBUMIN 3.9   CBG: Recent Labs  Lab 07/21/24 1414 07/21/24 1811 07/21/24 1956 07/22/24 0735  GLUCAP 207* 99 176* 106*    Recent Results (from the past 240 hours)  Resp panel by RT-PCR (RSV, Flu A&B, Covid) Anterior Nasal Swab     Status: None   Collection Time: 07/21/24  4:30 AM   Specimen: Anterior Nasal Swab  Result Value Ref Range Status   SARS Coronavirus 2 by RT PCR NEGATIVE NEGATIVE Final    Comment: (NOTE) SARS-CoV-2 target nucleic acids are NOT DETECTED.  The SARS-CoV-2 RNA is generally detectable in upper respiratory specimens during the acute phase of infection. The lowest concentration of SARS-CoV-2 viral copies this assay can detect is 138 copies/mL. A negative result does not preclude SARS-Cov-2 infection and should not be used as the sole basis for treatment or other patient management decisions. A negative result may occur with  improper specimen collection/handling, submission of specimen other than  nasopharyngeal swab, presence of viral mutation(s) within the areas targeted by this assay, and inadequate number of viral copies(<138 copies/mL). A negative result must be combined with clinical observations, patient history, and epidemiological information. The expected result is Negative.  Fact Sheet for Patients:  BloggerCourse.com  Fact Sheet for Healthcare Providers:  SeriousBroker.it  This test is no t yet approved or cleared by the [place]  FDA and  has been authorized for detection and/or diagnosis of SARS-CoV-2 by FDA under an Emergency Use Authorization (EUA). This EUA will remain  in effect (meaning this test can be used) for the duration of the COVID-19 declaration under Section 564(b)(1) of the Act, 21  U.S.C.section 360bbb-3(b)(1), unless the authorization is terminated  or revoked sooner.       Influenza A by PCR NEGATIVE NEGATIVE Final   Influenza B by PCR NEGATIVE NEGATIVE Final    Comment: (NOTE) The Xpert Xpress SARS-CoV-2/FLU/RSV plus assay is intended as an aid in the diagnosis of influenza from Nasopharyngeal swab specimens and should not be used as a sole basis for treatment. Nasal washings and aspirates are unacceptable for Xpert Xpress SARS-CoV-2/FLU/RSV testing.  Fact Sheet for Patients: BloggerCourse.com  Fact Sheet for Healthcare Providers: SeriousBroker.it  This test is not yet approved or cleared by the [place]  FDA and has been authorized for detection and/or diagnosis of SARS-CoV-2 by FDA under an Emergency Use Authorization (EUA). This EUA will remain in effect (meaning this test can be used) for the duration of the COVID-19 declaration under Section 564(b)(1) of the Act, 21 U.S.C. section 360bbb-3(b)(1), unless the authorization is terminated or revoked.     Resp Syncytial Virus by PCR NEGATIVE NEGATIVE Final    Comment:  (NOTE) Fact Sheet for Patients: BloggerCourse.com  Fact Sheet for Healthcare Providers: SeriousBroker.it  This test is not yet approved or cleared by the [place]  FDA and has been authorized for detection and/or diagnosis of SARS-CoV-2 by FDA under an Emergency Use Authorization (EUA). This EUA will remain in effect (meaning this test can be used) for the duration of the COVID-19 declaration under Section 564(b)(1) of the Act, 21 U.S.C. section 360bbb-3(b)(1), unless the authorization is terminated or revoked.  Performed at [facility], [address], [place], [place] [address]      Radiology Studies: CT Angio Chest PE W/Cm &/Or Wo Cm Result Date: 07/21/2024 CLINICAL DATA:  Shortness of breath started this morning. Clinical concern for pulmonary embolus. EXAM: CT ANGIOGRAPHY CHEST WITH CONTRAST TECHNIQUE: Multidetector CT imaging of the chest was performed using the standard protocol during bolus administration of intravenous contrast. Multiplanar CT image reconstructions and MIPs were obtained to evaluate the vascular anatomy. RADIATION DOSE REDUCTION: This exam was performed according to the departmental dose-optimization program which includes automated exposure control, adjustment of the mA and/or kV according to patient size and/or use of iterative reconstruction technique. CONTRAST:  75mL OMNIPAQUE  IOHEXOL  350 MG/ML SOLN COMPARISON:  08/15/2023 FINDINGS: Cardiovascular: The heart size is upper normal to borderline enlarged. Coronary artery calcification is evident. Moderate atherosclerotic calcification is noted in the wall of the thoracic aorta. Enlargement of the pulmonary outflow tract/main pulmonary arteries suggests pulmonary arterial hypertension. There is no filling defect within the opacified pulmonary arteries to suggest the presence of an acute pulmonary embolus. Mediastinum/Nodes: Similar mediastinal  lymphadenopathy including low 14 mm short axis right paratracheal node. There is no hilar lymphadenopathy. The esophagus has normal imaging features. There is no axillary lymphadenopathy. Lungs/Pleura: Interlobular septal thickening noted in the upper lungs with interval development of patchy and nodular ground-glass opacity in the right upper and lower lobes. Areas of confluent nodularity are seen in the posterior right lower lobe measuring up to 9 mm (77/5). No substantial pleural effusion. Upper Abdomen: Stable nodular thickening of the adrenal glands, incompletely visualized and better characterized by CT abdomen 09/06/2023. Stable borderline to mild gastrohepatic and hepatoduodenal ligament lymphadenopathy. Surgical changes are noted in the stomach. No worrisome lytic or sclerotic osseous abnormality. Musculoskeletal: No worrisome lytic or sclerotic osseous abnormality. Review of the MIP images confirms the above findings. IMPRESSION: 1. No CT evidence for acute pulmonary embolus. 2. Interlobular septal thickening in the upper lungs suggest edema.  3. Interval development of patchy and nodular ground-glass opacity in the right upper and lower lobes. Imaging features are compatible with multifocal pneumonia. 4. Areas of confluent nodularity are seen in the posterior right lower lobe measuring up to 9 mm. These are likely related to the infectious/inflammatory process but require follow-up to ensure resolution. 5. Stable mediastinal lymphadenopathy, potentially reactive. Attention on follow-up recommended. 6. Enlargement of the pulmonary outflow tract/main pulmonary arteries suggests pulmonary arterial hypertension. 7. Stable borderline to mild gastrohepatic and hepatoduodenal ligament lymphadenopathy. Attention on follow-up recommended. 8.  Aortic Atherosclerosis (ICD10-I70.0). Electronically Signed   By: Camellia Candle M.D.   On: 07/21/2024 06:52   DG Chest Portable 1 View Result Date: 07/21/2024 EXAM: 1  VIEW(S) XRAY OF THE CHEST 07/21/2024 04:51:00 AM COMPARISON: 12/10/2023 CLINICAL HISTORY: acute dyspnea, hx of CHF and COPD. Acute dyspnea hx of CHF and COPD FINDINGS: LUNGS AND PLEURA: No focal pulmonary opacity. No pulmonary edema. Possible small left pleural effusion. No pneumothorax. HEART AND MEDIASTINUM: Calcified aortic atherosclerosis. BONES AND SOFT TISSUES: No acute osseous abnormality. ABDOMEN: Paucity of bowel gas. IMPRESSION: 1. Possible small left pleural effusion. Electronically signed by: Evalene Coho MD 07/21/2024 05:10 AM EDT RP Workstation: [id number]    Scheduled Meds:  alum & mag hydroxide-simeth  30 mL Oral TID AC & HS   azithromycin   500 mg Oral Daily   budesonide -glycopyrrolate-formoterol   2 puff Inhalation BID   busPIRone   30 mg Oral BID   carvedilol   3.125 mg Oral BID WC   enoxaparin  (LOVENOX ) injection  40 mg Subcutaneous Q24H   famotidine   20 mg Oral BID   Finerenone   1 tablet Oral Daily   fluticasone   1 spray Each Nare Daily   insulin  aspart  0-15 Units Subcutaneous TID WC   insulin  aspart  0-5 Units Subcutaneous QHS   lamoTRIgine   150 mg Oral BID   levETIRAcetam   250 mg Oral BID   levothyroxine   112 mcg Oral QAC breakfast   lithium  carbonate  150 mg Oral Daily   loratadine   10 mg Oral Daily   metFORMIN  500 mg Oral BID WC   nicotine   21 mg Transdermal Daily   pravastatin   40 mg Oral Daily   risperiDONE   3 mg Oral QHS   rOPINIRole   1 mg Oral QHS   valbenazine  80 mg Oral QHS   Continuous Infusions:  cefTRIAXone  (ROCEPHIN )  IV       LOS: 0 days  MDM: Patient is high risk for one or more organ failure.  They necessitate ongoing hospitalization for continued IV therapies and subsequent lab monitoring. Total time spent interpreting labs and vitals, reviewing the medical record, coordinating care amongst consultants and care team members, directly assessing and discussing care with the patient and/or family: 55 min  Neesha Langton, DO [facility]  To contact the attending physician between 7A-7P please use Epic Chat. To contact the covering physician during after hours 7P-7A, please review Amion.  07/22/2024, 8:09 AM   *This document has been created with the assistance of dictation software. Please excuse typographical errors. *

## 2024-07-22 NOTE — Care Management Obs Status (Signed)
 MEDICARE OBSERVATION STATUS NOTIFICATION   Patient Details  Name: Tammy [name] MRN: [id number] Date of Birth: [birth year]   Medicare Observation Status Notification Given:  Chaney BRANDY CHRISTIANE LELON, CMA 07/22/2024, 1:27 PM

## 2024-07-22 NOTE — Plan of Care (Signed)
  Problem: Education: Goal: Ability to describe self-care measures that may prevent or decrease complications (Diabetes Survival Skills Education) will improve Outcome: Progressing Goal: Individualized Educational Video(s) Outcome: Progressing   Problem: Coping: Goal: Ability to adjust to condition or change in health will improve Outcome: Progressing   Problem: Fluid Volume: Goal: Ability to maintain a balanced intake and output will improve Outcome: Progressing   Problem: Health Behavior/Discharge Planning: Goal: Ability to identify and utilize available resources and services will improve Outcome: Progressing Goal: Ability to manage health-related needs will improve Outcome: Progressing   Problem: Metabolic: Goal: Ability to maintain appropriate glucose levels will improve Outcome: Progressing   Problem: Nutritional: Goal: Maintenance of adequate nutrition will improve Outcome: Progressing Goal: Progress toward achieving an optimal weight will improve Outcome: Progressing   Problem: Skin Integrity: Goal: Risk for impaired skin integrity will decrease Outcome: Progressing   Problem: Tissue Perfusion: Goal: Adequacy of tissue perfusion will improve Outcome: Progressing   Problem: Education: Goal: Knowledge of General Education information will improve Description: Including pain rating scale, medication(s)/side effects and non-pharmacologic comfort measures Outcome: Progressing   Problem: Clinical Measurements: Goal: Ability to maintain clinical measurements within normal limits will improve Outcome: Progressing Goal: Will remain free from infection Outcome: Progressing Goal: Diagnostic test results will improve Outcome: Progressing Goal: Respiratory complications will improve Outcome: Progressing Goal: Cardiovascular complication will be avoided Outcome: Progressing   Problem: Health Behavior/Discharge Planning: Goal: Ability to manage health-related needs  will improve Outcome: Progressing   Problem: Pain Managment: Goal: General experience of comfort will improve and/or be controlled Outcome: Progressing   Problem: Elimination: Goal: Will not experience complications related to bowel motility Outcome: Progressing Goal: Will not experience complications related to urinary retention Outcome: Progressing   Problem: Coping: Goal: Level of anxiety will decrease Outcome: Progressing   Problem: Safety: Goal: Ability to remain free from injury will improve Outcome: Progressing   Problem: Skin Integrity: Goal: Risk for impaired skin integrity will decrease Outcome: Progressing   Problem: Activity: Goal: Ability to tolerate increased activity will improve Outcome: Progressing   Problem: Clinical Measurements: Goal: Ability to maintain a body temperature in the normal range will improve Outcome: Progressing   Problem: Respiratory: Goal: Ability to maintain adequate ventilation will improve Outcome: Progressing   Problem: Education: Goal: Knowledge of disease or condition will improve Outcome: Progressing Goal: Knowledge of the prescribed therapeutic regimen will improve Outcome: Progressing Goal: Individualized Educational Video(s) Outcome: Progressing   Problem: Activity: Goal: Ability to tolerate increased activity will improve Outcome: Progressing Goal: Will verbalize the importance of balancing activity with adequate rest periods Outcome: Progressing   Problem: Respiratory: Goal: Ability to maintain a clear airway will improve Outcome: Progressing Goal: Levels of oxygenation will improve Outcome: Progressing Goal: Ability to maintain adequate ventilation will improve Outcome: Progressing

## 2024-07-22 NOTE — TOC Initial Note (Signed)
 Transition of Care [facility]) - Initial/Assessment Note    Patient Details  Name: Tammy [name] MRN: [id number] Date of Birth: May 12, 1961  [facility] CM/SW Contact:    Daved JONETTA Hamilton, RN Phone Number: 07/22/2024, 12:46 PM  Clinical Narrative:                  Patient presented to ED from [facility]. Spoke with Marinda at [facility]. He verbalized that pending patients medical requirements at discharge, she can return there and he will transport patient.   Will continue to follow for discharge planning.     Expected Discharge Plan: Group Home [facility], an Adult Family Care facility)     Patient Goals and CMS Choice            Expected Discharge Plan and Services       Living arrangements for the past 2 months: Group Home [facility], an Adult Family Care facility)                                      Prior Living Arrangements/Services Living arrangements for the past 2 months: Group Home [facility], an Adult Family Care facility) Lives with:: Other (Comment) ([facility]) Patient language and need for interpreter reviewed:: Yes          Care giver support system in place?: Yes (comment)   Criminal Activity/Legal Involvement Pertinent to Current Situation/Hospitalization: No - Comment as needed  Activities of Daily Living   ADL Screening (condition at time of admission) Independently performs ADLs?: No Does the patient have a NEW difficulty with bathing/dressing/toileting/self-feeding that is expected to last >3 days?: No Does the patient have a NEW difficulty with getting in/out of bed, walking, or climbing stairs that is expected to last >3 days?: No Does the patient have a NEW difficulty with communication that is expected to last >3 days?: No Is the patient deaf or have difficulty hearing?: No Does the patient have difficulty seeing, even when  wearing glasses/contacts?: No Does the patient have difficulty concentrating, remembering, or making decisions?: Yes  Permission Sought/Granted                  Emotional Assessment Appearance:: Appears stated age     Orientation: : Oriented to Self   Psych Involvement: No (comment)  Admission diagnosis:  Acute pulmonary edema (HCC) [J81.0] PNA (pneumonia) [J18.9] Acute dyspnea [R06.00] Acute on chronic respiratory failure with hypoxia (HCC) [J96.21] Multifocal pneumonia [J18.8] Patient Active Problem List   Diagnosis Date Noted   CAP (community acquired pneumonia) 07/21/2024   PNA (pneumonia) 07/21/2024   Anxiety 12/11/2023   GERD without esophagitis 12/11/2023   Type 2 diabetes mellitus with chronic kidney disease, with long-term current use of insulin  (HCC) 12/11/2023   COPD exacerbation (HCC) 12/10/2023   Diabetes mellitus type II, non insulin  dependent (HCC) 08/15/2023   Essential hypertension 08/15/2023   Dyslipidemia 08/15/2023   Tobacco dependence due to cigarettes 08/15/2023   Chronic diastolic CHF (congestive heart failure) (HCC) 08/14/2023   Hypothyroidism 08/14/2023   Bipolar mood disorder (HCC) 08/14/2023   Seizure-like activity (HCC) 08/03/2019   B12 deficiency 06/23/2019   Iron deficiency 06/21/2019   History of seizure 04/07/2019   Seizure disorder (HCC) 03/22/2019   Normocytic anemia 03/18/2019   Influenza A 11/10/2018   Hyponatremia 10/26/2018  Syncopal episodes 06/23/2017   Acute on chronic respiratory failure with hypoxia (HCC) 03/15/2017   COPD with acute exacerbation (HCC) 03/15/2017   Acute on chronic respiratory failure (HCC) 03/15/2017   PCP:  Donal Channing SQUIBB, FNP Pharmacy:   [facility] - [place], [place] - [address] [place] [address] Phone: [phone] Fax: [phone]  [id number] - [place], [place] - [address] [place] [address] Phone:  [phone] Fax: [phone]     Social Drivers of Health (SDOH) Social History: SDOH Screenings   Food Insecurity: No Food Insecurity (07/21/2024)  Housing: Low Risk  (07/21/2024)  Transportation Needs: No Transportation Needs (07/21/2024)  Utilities: Not At Risk (07/21/2024)  Financial Resource Strain: Low Risk  (10/29/2018)  Physical Activity: Unknown (10/29/2018)  Social Connections: Unknown (12/11/2023)  Stress: No Stress Concern Present (10/29/2018)  Tobacco Use: Medium Risk (07/21/2024)   SDOH Interventions:     Readmission Risk Interventions     No data to display

## 2024-07-23 ENCOUNTER — Other Ambulatory Visit: Payer: Self-pay

## 2024-07-23 ENCOUNTER — Encounter: Payer: Self-pay | Admitting: Internal Medicine

## 2024-07-23 DIAGNOSIS — J441 Chronic obstructive pulmonary disease with (acute) exacerbation: Secondary | ICD-10-CM | POA: Diagnosis not present

## 2024-07-23 DIAGNOSIS — J189 Pneumonia, unspecified organism: Secondary | ICD-10-CM | POA: Diagnosis not present

## 2024-07-23 LAB — GLUCOSE, CAPILLARY
Glucose-Capillary: 104 mg/dL — ABNORMAL HIGH (ref 70–99)
Glucose-Capillary: 102 mg/dL — ABNORMAL HIGH (ref 70–99)

## 2024-07-23 MED ORDER — VALBENAZINE TOSYLATE 80 MG PO CAPS: 80.0000 mg | ORAL_CAPSULE | Freq: Every day | ORAL | Status: AC

## 2024-07-23 MED ORDER — LEVOFLOXACIN 750 MG PO TABS: 750.0000 mg | ORAL_TABLET | Freq: Every day | ORAL | 0 refills | Status: AC

## 2024-07-23 MED ORDER — NITROFURANTOIN MONOHYD MACRO 100 MG PO CAPS: 100.0000 mg | ORAL_CAPSULE | Freq: Every day | ORAL | Status: DC

## 2024-07-23 MED ORDER — LEVOFLOXACIN 750 MG PO TABS
750.0000 mg | ORAL_TABLET | Freq: Every day | ORAL | 0 refills | Status: DC
  Filled 2024-07-23: qty 2, 2d supply, fill #0

## 2024-07-23 NOTE — Discharge Summary (Addendum)
 DISCHARGE SUMMARY    Tammy [name] FMW:[id number] DOB: 07-06-1961 DOA: 07/21/2024  PCP: Donal Channing SQUIBB, FNP  Admit date: 07/21/2024 Discharge date: 07/23/2024   Recommendations for Outpatient Follow-up:  Follow up with PCP in 1-2 weeks to continue chronic condition management   Hospital Course: Tammy [name] is a 63 year old female with COPD, chronic hypoxic respiratory failure on 2 L, diabetes, chronic heart failure preserved EF, seizure disorder, anxiety, depression, dementia, who presented with cough and shortness of breath.  On EMS arrival she was found to be in respiratory distress, tachypneic, and requiring 6 L O2 to stabilize near 88%.  She was otherwise afebrile and nontachycardic.  CXR revealed nodular ground glass opacities in the right upper and lower lobes compatible with multifocal pneumonia.  She was admitted and started on antibiotics, but refused prednisone  due to reported allergy. Gradually she was able to wean to her baseline of 2 L O2.  She will complete a course of antibiotics outpatient. Stay briefly complicated by complaint of abdominal pain, and patient did seem to have high stool burden on physical exam.  She was started on aggressive bowel regimen to produce bowel movements.  Abdominal exam on 10/17 was much improved.  She is going back to her group home today.   Acute on chronic hypoxic respiratory failure - At baseline patient requires 2 L O2.  She was tachypneic and desaturating requiring 6 L for stabilization on arrival - Secondary to multifocal pneumonia and COPD exacerbation - Back to home O2 requirement now  Abdominal pain, resolved - Secondary to constipation.  High stool burden 10/16.  Lactulose given.  Patient reports success - Abdominal pain resolved on repeat exam 10/17 - Continue daily MiraLAX  at DC  Multifocal pneumonia - Status post 3 days of ceftriaxone  azithromycin , continue with an additional 2 days of Levaquin  to complete  course outpatient. - Some component of dysphagia may also be at play.  SLP eval on arrival.  On dysphagia 1 diet, continue this at home - Encourage I-S and FV   Acute COPD exacerbation - Refused steroid therapy due to reported prednisone  allergy. - Continue home meds at DC.  No wheezing today.   History of dysphagia - Dysphagia 1 diet   Type 2 diabetes - Continue home meds   GERD - Continue Pepcid    Hypertension - Continue home meds   Hyperlipidemia - Continue statin   Dementia - Patient appears to be at her mental status baseline.  Resume home meds.  Discharge Instructions  Discharge Instructions     Call MD for:  difficulty breathing, headache or visual disturbances   Complete by: As directed    Call MD for:  persistant dizziness or light-headedness   Complete by: As directed    Call MD for:  persistant nausea and vomiting   Complete by: As directed    Call MD for:  severe uncontrolled pain   Complete by: As directed    Call MD for:  temperature >100.4   Complete by: As directed    Diet general   Complete by: As directed    Dysphasia - finger foods only   Discharge instructions   Complete by: As directed    Follow up with your primary care physician to discuss the medication changes during this admission   Increase activity slowly   Complete by: As directed       Allergies as of 07/23/2024       Reactions   Penicillins Rash   Has  patient had a PCN reaction causing immediate rash, facial/tongue/throat swelling, SOB or lightheadedness with hypotension: No Has patient had a PCN reaction causing severe rash involving mucus membranes or skin necrosis: No Has patient had a PCN reaction that required hospitalization: No Has patient had a PCN reaction occurring within the last 10 years: No If all of the above answers are NO, then may proceed with Cephalosporin use.   Prednisone  Rash        Medication List     STOP taking these medications     nitrofurantoin  100 MG capsule Commonly known as: MACRODANTIN    rOPINIRole  1 MG tablet Commonly known as: REQUIP    Vitamin D  (Ergocalciferol ) 1.25 MG (50000 UNIT) Caps capsule Commonly known as: DRISDOL        TAKE these medications    albuterol  108 (90 Base) MCG/ACT inhaler Commonly known as: VENTOLIN  HFA Inhale 1-2 puffs into the lungs every 6 (six) hours as needed for wheezing or shortness of breath.   albuterol  (2.5 MG/3ML) 0.083% nebulizer solution Commonly known as: PROVENTIL  Take 2.5 mg by nebulization in the morning and at bedtime.   aluminum -magnesium  hydroxide-simethicone  200-200-20 MG/5ML Susp Commonly known as: MAALOX Take 30 mLs by mouth 4 (four) times daily -  before meals and at bedtime.   b complex vitamins capsule Take 1 capsule by mouth daily. Every other day   busPIRone  30 MG tablet Commonly known as: BUSPAR  Take 30 mg by mouth 2 (two) times daily.   carvedilol  3.125 MG tablet Commonly known as: COREG  Take 3.125 mg by mouth 2 (two) times daily with a meal.   cetirizine 10 MG tablet Commonly known as: ZYRTEC Take 10 mg by mouth daily.   cholecalciferol 25 MCG (1000 UNIT) tablet Commonly known as: VITAMIN D3 Take 5,000 Units by mouth daily.   Cyanocobalamin  1000 MCG Subl Place 1 tablet (1,000 mcg total) under the tongue daily.   empagliflozin  25 MG Tabs tablet Commonly known as: JARDIANCE  Take 25 mg by mouth daily.   famotidine  40 MG tablet Commonly known as: PEPCID  Take 40 mg by mouth daily.   ferrous sulfate  325 (65 FE) MG EC tablet Take 1 tablet (325 mg total) by mouth daily with breakfast.   Fish Oil 1000 MG Caps Take 1,000 mg by mouth 2 (two) times daily.   fluticasone  50 MCG/ACT nasal spray Commonly known as: FLONASE  Place 1 spray into both nostrils daily.   Kerendia  20 MG Tabs Generic drug: Finerenone  Take 1 tablet by mouth daily.   lamoTRIgine  150 MG tablet Commonly known as: LAMICTAL  Take 150 mg by mouth 2 (two) times  daily.   levETIRAcetam  250 MG tablet Commonly known as: KEPPRA  Take 250 mg by mouth 2 (two) times daily.   levofloxacin  750 MG tablet Commonly known as: Levaquin  Take 1 tablet (750 mg total) by mouth daily for 2 days.   levothyroxine  112 MCG tablet Commonly known as: SYNTHROID  Take 112 mcg by mouth daily before breakfast.   lithium  carbonate 150 MG capsule Take 150 mg by mouth daily.   metFORMIN 500 MG tablet Commonly known as: GLUCOPHAGE Take 500 mg by mouth 2 (two) times daily with a meal.   nicotine  21 mg/24hr patch Commonly known as: NICODERM CQ  - dosed in mg/24 hours Place 1 patch (21 mg total) onto the skin daily.   nitrofurantoin  (macrocrystal-monohydrate) 100 MG capsule Commonly known as: Macrobid  Take 1 capsule (100 mg total) by mouth daily.   ondansetron  4 MG tablet Commonly known as: ZOFRAN  Take 1  tablet (4 mg total) by mouth every 8 (eight) hours as needed for nausea or vomiting.   polyethylene glycol 17 g packet Commonly known as: MIRALAX  / GLYCOLAX  Take 17 g by mouth daily as needed.   pravastatin  40 MG tablet Commonly known as: PRAVACHOL  Take 40 mg by mouth daily.   risperiDONE  0.5 MG tablet Commonly known as: RISPERDAL  Take 0.5 mg by mouth 2 (two) times daily. What changed: Another medication with the same name was removed. Continue taking this medication, and follow the directions you see here.   Trelegy Ellipta 100-62.5-25 MCG/INH Aepb Generic drug: Fluticasone -Umeclidin-Vilant Inhale 1 puff into the lungs daily.   valbenazine 80 MG capsule Commonly known as: Ingrezza Take 1 capsule (80 mg total) by mouth daily. What changed: when to take this   Vitamin K2 100 MCG Tabs Take 100 mcg by mouth daily.        Follow-up Information     Donal Channing SQUIBB, FNP Follow up.   Specialty: Family Medicine Why: hospital follow up Contact information: [address] [place] [address] [phone]                Allergies   Allergen Reactions   Penicillins Rash    Has patient had a PCN reaction causing immediate rash, facial/tongue/throat swelling, SOB or lightheadedness with hypotension: No Has patient had a PCN reaction causing severe rash involving mucus membranes or skin necrosis: No Has patient had a PCN reaction that required hospitalization: No Has patient had a PCN reaction occurring within the last 10 years: No If all of the above answers are NO, then may proceed with Cephalosporin use.    Prednisone  Rash    Consultations:    Procedures/Studies: CT Angio Chest PE W/Cm &/Or Wo Cm Result Date: 07/21/2024 CLINICAL DATA:  Shortness of breath started this morning. Clinical concern for pulmonary embolus. EXAM: CT ANGIOGRAPHY CHEST WITH CONTRAST TECHNIQUE: Multidetector CT imaging of the chest was performed using the standard protocol during bolus administration of intravenous contrast. Multiplanar CT image reconstructions and MIPs were obtained to evaluate the vascular anatomy. RADIATION DOSE REDUCTION: This exam was performed according to the departmental dose-optimization program which includes automated exposure control, adjustment of the mA and/or kV according to patient size and/or use of iterative reconstruction technique. CONTRAST:  75mL OMNIPAQUE  IOHEXOL  350 MG/ML SOLN COMPARISON:  08/15/2023 FINDINGS: Cardiovascular: The heart size is upper normal to borderline enlarged. Coronary artery calcification is evident. Moderate atherosclerotic calcification is noted in the wall of the thoracic aorta. Enlargement of the pulmonary outflow tract/main pulmonary arteries suggests pulmonary arterial hypertension. There is no filling defect within the opacified pulmonary arteries to suggest the presence of an acute pulmonary embolus. Mediastinum/Nodes: Similar mediastinal lymphadenopathy including low 14 mm short axis right paratracheal node. There is no hilar lymphadenopathy. The esophagus has normal imaging  features. There is no axillary lymphadenopathy. Lungs/Pleura: Interlobular septal thickening noted in the upper lungs with interval development of patchy and nodular ground-glass opacity in the right upper and lower lobes. Areas of confluent nodularity are seen in the posterior right lower lobe measuring up to 9 mm (77/5). No substantial pleural effusion. Upper Abdomen: Stable nodular thickening of the adrenal glands, incompletely visualized and better characterized by CT abdomen 09/06/2023. Stable borderline to mild gastrohepatic and hepatoduodenal ligament lymphadenopathy. Surgical changes are noted in the stomach. No worrisome lytic or sclerotic osseous abnormality. Musculoskeletal: No worrisome lytic or sclerotic osseous abnormality. Review of the MIP images confirms the above findings. IMPRESSION: 1. No  CT evidence for acute pulmonary embolus. 2. Interlobular septal thickening in the upper lungs suggest edema. 3. Interval development of patchy and nodular ground-glass opacity in the right upper and lower lobes. Imaging features are compatible with multifocal pneumonia. 4. Areas of confluent nodularity are seen in the posterior right lower lobe measuring up to 9 mm. These are likely related to the infectious/inflammatory process but require follow-up to ensure resolution. 5. Stable mediastinal lymphadenopathy, potentially reactive. Attention on follow-up recommended. 6. Enlargement of the pulmonary outflow tract/main pulmonary arteries suggests pulmonary arterial hypertension. 7. Stable borderline to mild gastrohepatic and hepatoduodenal ligament lymphadenopathy. Attention on follow-up recommended. 8.  Aortic Atherosclerosis (ICD10-I70.0). Electronically Signed   By: Camellia Candle M.D.   On: 07/21/2024 06:52   DG Chest Portable 1 View Result Date: 07/21/2024 EXAM: 1 VIEW(S) XRAY OF THE CHEST 07/21/2024 04:51:00 AM COMPARISON: 12/10/2023 CLINICAL HISTORY: acute dyspnea, hx of CHF and COPD. Acute dyspnea hx of  CHF and COPD FINDINGS: LUNGS AND PLEURA: No focal pulmonary opacity. No pulmonary edema. Possible small left pleural effusion. No pneumothorax. HEART AND MEDIASTINUM: Calcified aortic atherosclerosis. BONES AND SOFT TISSUES: No acute osseous abnormality. ABDOMEN: Paucity of bowel gas. IMPRESSION: 1. Possible small left pleural effusion. Electronically signed by: Evalene Coho MD 07/21/2024 05:10 AM EDT RP Workstation: [id number]      Discharge Exam: Vitals:   07/23/24 0800 07/23/24 0832  BP: 106/65 106/65  Pulse: (!) 58 61  Resp: 18   Temp: 98.2 F (36.8 C)   SpO2: 98%    Vitals:   07/22/24 1916 07/23/24 0422 07/23/24 0800 07/23/24 0832  BP: 106/69 (!) 98/56 106/65 106/65  Pulse: 70 (!) 59 (!) 58 61  Resp:   18   Temp: 97.7 F (36.5 C) 97.6 F (36.4 C) 98.2 F (36.8 C)   TempSrc: Oral Oral    SpO2: 97% 96% 98%   Weight:      Height:        General exam: Appears calm and comfortable, NAD  Respiratory system: No work of breathing, symmetric chest wall expansion Cardiovascular system: S1 & S2 heard, RRR.  Gastrointestinal system: Abdomen softer, nontender to palpation, no significant stool burden appreciated today. Neuro: Alert and pleasantly confused.   The results of significant diagnostics from this hospitalization (including imaging, microbiology, ancillary and laboratory) are listed below for reference.     Microbiology: Recent Results (from the past 240 hours)  Resp panel by RT-PCR (RSV, Flu A&B, Covid) Anterior Nasal Swab     Status: None   Collection Time: 07/21/24  4:30 AM   Specimen: Anterior Nasal Swab  Result Value Ref Range Status   SARS Coronavirus 2 by RT PCR NEGATIVE NEGATIVE Final    Comment: (NOTE) SARS-CoV-2 target nucleic acids are NOT DETECTED.  The SARS-CoV-2 RNA is generally detectable in upper respiratory specimens during the acute phase of infection. The lowest concentration of SARS-CoV-2 viral copies this assay can detect is 138  copies/mL. A negative result does not preclude SARS-Cov-2 infection and should not be used as the sole basis for treatment or other patient management decisions. A negative result may occur with  improper specimen collection/handling, submission of specimen other than nasopharyngeal swab, presence of viral mutation(s) within the areas targeted by this assay, and inadequate number of viral copies(<138 copies/mL). A negative result must be combined with clinical observations, patient history, and epidemiological information. The expected result is Negative.  Fact Sheet for Patients:  BloggerCourse.com  Fact Sheet for Healthcare Providers:  SeriousBroker.it  This test is no t yet approved or cleared by the [place]  FDA and  has been authorized for detection and/or diagnosis of SARS-CoV-2 by FDA under an Emergency Use Authorization (EUA). This EUA will remain  in effect (meaning this test can be used) for the duration of the COVID-19 declaration under Section 564(b)(1) of the Act, 21 U.S.C.section 360bbb-3(b)(1), unless the authorization is terminated  or revoked sooner.       Influenza A by PCR NEGATIVE NEGATIVE Final   Influenza B by PCR NEGATIVE NEGATIVE Final    Comment: (NOTE) The Xpert Xpress SARS-CoV-2/FLU/RSV plus assay is intended as an aid in the diagnosis of influenza from Nasopharyngeal swab specimens and should not be used as a sole basis for treatment. Nasal washings and aspirates are unacceptable for Xpert Xpress SARS-CoV-2/FLU/RSV testing.  Fact Sheet for Patients: BloggerCourse.com  Fact Sheet for Healthcare Providers: SeriousBroker.it  This test is not yet approved or cleared by the [place]  FDA and has been authorized for detection and/or diagnosis of SARS-CoV-2 by FDA under an Emergency Use Authorization (EUA). This EUA will remain in effect (meaning  this test can be used) for the duration of the COVID-19 declaration under Section 564(b)(1) of the Act, 21 U.S.C. section 360bbb-3(b)(1), unless the authorization is terminated or revoked.     Resp Syncytial Virus by PCR NEGATIVE NEGATIVE Final    Comment: (NOTE) Fact Sheet for Patients: BloggerCourse.com  Fact Sheet for Healthcare Providers: SeriousBroker.it  This test is not yet approved or cleared by the [place]  FDA and has been authorized for detection and/or diagnosis of SARS-CoV-2 by FDA under an Emergency Use Authorization (EUA). This EUA will remain in effect (meaning this test can be used) for the duration of the COVID-19 declaration under Section 564(b)(1) of the Act, 21 U.S.C. section 360bbb-3(b)(1), unless the authorization is terminated or revoked.  Performed at [facility], [address] [place], [place] [address]      Labs: BNP (last 3 results) Recent Labs    08/14/23 2040 12/10/23 2051 07/21/24 0347  BNP 37.8 27.8 24.0   Basic Metabolic Panel: Recent Labs  Lab 07/21/24 0347 07/22/24 0357  NA 142 142  K 4.2 4.5  CL 106 108  CO2 24 24  GLUCOSE 136* 136*  BUN 32* 31*  CREATININE 1.22* 0.88  CALCIUM  9.7 9.5   Liver Function Tests: Recent Labs  Lab 07/21/24 0347  AST 28  ALT 21  ALKPHOS 65  BILITOT 0.7  PROT 6.6  ALBUMIN 3.9   Recent Labs  Lab 07/21/24 0347  LIPASE 33   No results for input(s): AMMONIA in the last 168 hours. CBC: Recent Labs  Lab 07/21/24 0347 07/22/24 0357  WBC 8.6 11.0*  NEUTROABS 6.3  --   HGB 13.9 13.5  HCT 42.8 41.3  MCV 94.3 95.6  PLT 170 155   Cardiac Enzymes: No results for input(s): CKTOTAL, CKMB, CKMBINDEX, TROPONINI in the last 168 hours. BNP: Invalid input(s): POCBNP CBG: Recent Labs  Lab 07/22/24 1314 07/22/24 1818 07/22/24 2131 07/23/24 0801 07/23/24 1234  GLUCAP 216* 167* 143* 104* 102*   D-Dimer No  results for input(s): DDIMER in the last 72 hours. Hgb A1c Recent Labs    07/21/24 1420  HGBA1C 5.9*   Lipid Profile No results for input(s): CHOL, HDL, LDLCALC, TRIG, CHOLHDL, LDLDIRECT in the last 72 hours. Thyroid  function studies No results for input(s): TSH, T4TOTAL, T3FREE, THYROIDAB in the last 72 hours.  Invalid input(s): FREET3 Anemia work  up No results for input(s): VITAMINB12, FOLATE, FERRITIN, TIBC, IRON, RETICCTPCT in the last 72 hours. Urinalysis    Component Value Date/Time   COLORURINE YELLOW (A) 08/15/2023 0211   APPEARANCEUR HAZY (A) 08/15/2023 0211   LABSPEC 1.013 08/15/2023 0211   PHURINE 5.0 08/15/2023 0211   GLUCOSEU >=500 (A) 08/15/2023 0211   HGBUR SMALL (A) 08/15/2023 0211   BILIRUBINUR negative 10/10/2023 1750   KETONESUR negative 10/10/2023 1750   KETONESUR NEGATIVE 08/15/2023 0211   PROTEINUR negative 10/10/2023 1750   PROTEINUR NEGATIVE 08/15/2023 0211   UROBILINOGEN 0.2 10/10/2023 1750   NITRITE Negative 10/10/2023 1750   NITRITE NEGATIVE 08/15/2023 0211   LEUKOCYTESUR Small (1+) (A) 10/10/2023 1750   LEUKOCYTESUR NEGATIVE 08/15/2023 0211   Sepsis Labs Recent Labs  Lab 07/21/24 0347 07/22/24 0357  WBC 8.6 11.0*   Microbiology Recent Results (from the past 240 hours)  Resp panel by RT-PCR (RSV, Flu A&B, Covid) Anterior Nasal Swab     Status: None   Collection Time: 07/21/24  4:30 AM   Specimen: Anterior Nasal Swab  Result Value Ref Range Status   SARS Coronavirus 2 by RT PCR NEGATIVE NEGATIVE Final    Comment: (NOTE) SARS-CoV-2 target nucleic acids are NOT DETECTED.  The SARS-CoV-2 RNA is generally detectable in upper respiratory specimens during the acute phase of infection. The lowest concentration of SARS-CoV-2 viral copies this assay can detect is 138 copies/mL. A negative result does not preclude SARS-Cov-2 infection and should not be used as the sole basis for treatment or other patient  management decisions. A negative result may occur with  improper specimen collection/handling, submission of specimen other than nasopharyngeal swab, presence of viral mutation(s) within the areas targeted by this assay, and inadequate number of viral copies(<138 copies/mL). A negative result must be combined with clinical observations, patient history, and epidemiological information. The expected result is Negative.  Fact Sheet for Patients:  BloggerCourse.com  Fact Sheet for Healthcare Providers:  SeriousBroker.it  This test is no t yet approved or cleared by the [place]  FDA and  has been authorized for detection and/or diagnosis of SARS-CoV-2 by FDA under an Emergency Use Authorization (EUA). This EUA will remain  in effect (meaning this test can be used) for the duration of the COVID-19 declaration under Section 564(b)(1) of the Act, 21 U.S.C.section 360bbb-3(b)(1), unless the authorization is terminated  or revoked sooner.       Influenza A by PCR NEGATIVE NEGATIVE Final   Influenza B by PCR NEGATIVE NEGATIVE Final    Comment: (NOTE) The Xpert Xpress SARS-CoV-2/FLU/RSV plus assay is intended as an aid in the diagnosis of influenza from Nasopharyngeal swab specimens and should not be used as a sole basis for treatment. Nasal washings and aspirates are unacceptable for Xpert Xpress SARS-CoV-2/FLU/RSV testing.  Fact Sheet for Patients: BloggerCourse.com  Fact Sheet for Healthcare Providers: SeriousBroker.it  This test is not yet approved or cleared by the [place]  FDA and has been authorized for detection and/or diagnosis of SARS-CoV-2 by FDA under an Emergency Use Authorization (EUA). This EUA will remain in effect (meaning this test can be used) for the duration of the COVID-19 declaration under Section 564(b)(1) of the Act, 21 U.S.C. section 360bbb-3(b)(1),  unless the authorization is terminated or revoked.     Resp Syncytial Virus by PCR NEGATIVE NEGATIVE Final    Comment: (NOTE) Fact Sheet for Patients: BloggerCourse.com  Fact Sheet for Healthcare Providers: SeriousBroker.it  This test is not yet approved or cleared by the  [place]  FDA and has been authorized for detection and/or diagnosis of SARS-CoV-2 by FDA under an Emergency Use Authorization (EUA). This EUA will remain in effect (meaning this test can be used) for the duration of the COVID-19 declaration under Section 564(b)(1) of the Act, 21 U.S.C. section 360bbb-3(b)(1), unless the authorization is terminated or revoked.  Performed at [facility], [address] [place], [place] [address]      Time coordinating discharge: 32 min   SIGNED: Lorane Poland, DO [facility] 07/23/2024, 4:43 PM Pager   If 7PM-7AM, please contact night-coverage

## 2024-07-23 NOTE — TOC Transition Note (Signed)
 Transition of Care [facility]) - Discharge Note   Patient Details  Name: Tammy [name] MRN: [id number] Date of Birth: 04-18-1961  Transition of [facility]) CM/SW Contact:  Daved JONETTA Hamilton, RN Phone Number: 07/23/2024, 2:24 PM   Clinical Narrative:     Patient ready for discharge. Spoke with [name] at [facility], he will arrange transportation for patient from hospital back to [facility]. Fl2, discharge summary, and list of medications that patient was administered today, printed and given to Unit Clerk to include with patients discharge packet. Attempted to notify patients sister Krimson Massmann of patients discharge, phone did not ring, message came on that the voicemail has not been set up.     Final next level of care: Group Home [facility]) Barriers to Discharge: No Barriers Identified   Patient Goals and CMS Choice            Discharge Placement                  Name of family member notified: Attempted to contact Florice Hindle (sister) [phone], number did not ring, message played that the voicemail has not been set up    Discharge Plan and Services Additional resources added to the After Visit Summary for                                       Social Drivers of Health (SDOH) Interventions SDOH Screenings   Food Insecurity: No Food Insecurity (07/21/2024)  Housing: Low Risk  (07/21/2024)  Transportation Needs: No Transportation Needs (07/21/2024)  Utilities: Not At Risk (07/21/2024)  Financial Resource Strain: Low Risk  (10/29/2018)  Physical Activity: Unknown (10/29/2018)  Social Connections: Unknown (12/11/2023)  Stress: No Stress Concern Present (10/29/2018)  Tobacco Use: Medium Risk (07/21/2024)     Readmission Risk Interventions     No data to display

## 2024-07-23 NOTE — [place] FL2 (Signed)
 [place]  MEDICAID FL2 LEVEL OF CARE FORM     IDENTIFICATION  Patient Name: Tammy [name] [facility] Birthdate: 1961/07/04 Sex: female Admission Date (Current Location): 07/21/2024  [facility] and [place] Number:  Chiropodist and Address:  [facility], [address], [place], [place] [address]      Provider Number: [id number]  Attending Physician Name and Address:  Leesa Kast, DO  Relative Name and Phone Number:  [name]- [phone]    Current Level of Care: Hospital Recommended Level of Care: [facility]) Prior Approval Number:    Date Approved/Denied:   PASRR Number:    Discharge Plan: Other (Comment) [facility])    Current Diagnoses: Patient Active Problem List   Diagnosis Date Noted   Pneumonia 07/22/2024   CAP (community acquired pneumonia) 07/21/2024   PNA (pneumonia) 07/21/2024   Anxiety 12/11/2023   GERD without esophagitis 12/11/2023   Type 2 diabetes mellitus with chronic kidney disease, with long-term current use of insulin  (HCC) 12/11/2023   COPD exacerbation (HCC) 12/10/2023   Diabetes mellitus type II, non insulin  dependent (HCC) 08/15/2023   Essential hypertension 08/15/2023   Dyslipidemia 08/15/2023   Tobacco dependence due to cigarettes 08/15/2023   Chronic diastolic CHF (congestive heart failure) (HCC) 08/14/2023   Hypothyroidism 08/14/2023   Bipolar mood disorder (HCC) 08/14/2023   Seizure-like activity (HCC) 08/03/2019   B12 deficiency 06/23/2019   Iron deficiency 06/21/2019   History of seizure 04/07/2019   Seizure disorder (HCC) 03/22/2019   Normocytic anemia 03/18/2019   Influenza A 11/10/2018   Hyponatremia 10/26/2018   Syncopal episodes 06/23/2017   Acute on chronic respiratory failure with hypoxia (HCC) 03/15/2017   COPD with acute exacerbation (HCC) 03/15/2017   Acute on chronic respiratory failure (HCC) 03/15/2017    Orientation RESPIRATION BLADDER  Height & Weight     Self, Place  Normal Continent Weight: 59 kg Height:  5' 5 (165.1 cm)  BEHAVIORAL SYMPTOMS/MOOD NEUROLOGICAL BOWEL NUTRITION STATUS      Continent Diet (DYS 1)  AMBULATORY STATUS COMMUNICATION OF NEEDS Skin   Independent Verbally Normal                       Personal Care Assistance Level of Assistance  Bathing, Feeding, Dressing Bathing Assistance: Independent Feeding assistance: Independent Dressing Assistance: Independent     Functional Limitations Info  Hearing   Hearing Info:  (documented as hard of hearing)      SPECIAL CARE FACTORS FREQUENCY                       Contractures Contractures Info: Not present    Additional Factors Info  Code Status, Allergies Code Status Info: FULL Allergies Info: Penicillians, Prednisone            Current Medications (07/23/2024):  This is the current hospital active medication list Current Facility-Administered Medications  Medication Dose Route Frequency Provider Last Rate Last Admin   acetaminophen  (TYLENOL ) tablet 650 mg  650 mg Oral Q6H PRN Laurita Manor T, MD       Or   acetaminophen  (TYLENOL ) suppository 650 mg  650 mg Rectal Q6H PRN Laurita Manor T, MD       albuterol  (PROVENTIL ) (2.5 MG/3ML) 0.083% nebulizer solution 3 mL  3 mL Inhalation Q6H PRN Laurita Manor DASEN, MD       alum & mag hydroxide-simeth (MAALOX/MYLANTA) 200-200-20 MG/5ML suspension 30 mL  30 mL Oral TID  AC & HS Laurita Manor T, MD   30 mL at 07/23/24 1253   azithromycin  (ZITHROMAX ) tablet 500 mg  500 mg Oral Daily Laurita Manor T, MD   500 mg at 07/23/24 9167   budesonide -glycopyrrolate-formoterol  (BREZTRI) 160-9-4.8 MCG/ACT inhaler 2 puff  2 puff Inhalation BID Laurita Manor T, MD   2 puff at 07/23/24 9166   busPIRone  (BUSPAR ) tablet 30 mg  30 mg Oral BID Laurita Manor T, MD   30 mg at 07/23/24 9167   carvedilol  (COREG ) tablet 3.125 mg  3.125 mg Oral BID WC Laurita Manor T, MD   3.125 mg at 07/23/24 9167   cefTRIAXone  (ROCEPHIN ) 1 g in  sodium chloride  0.9 % 100 mL IVPB  1 g Intravenous Q24H Laurita Manor T, MD 200 mL/hr at 07/23/24 0855 1 g at 07/23/24 0855   enoxaparin  (LOVENOX ) injection 40 mg  40 mg Subcutaneous Q24H Laurita Manor T, MD   40 mg at 07/23/24 9167   famotidine  (PEPCID ) tablet 20 mg  20 mg Oral BID Laurita Manor T, MD   20 mg at 07/23/24 9167   Finerenone  TABS 20 mg  1 tablet Oral Daily Laurita Manor T, MD       fluticasone  (FLONASE ) 50 MCG/ACT nasal spray 1 spray  1 spray Each Nare Daily Laurita Manor T, MD   1 spray at 07/23/24 0859   insulin  aspart (novoLOG ) injection 0-15 Units  0-15 Units Subcutaneous TID WC Laurita Manor DASEN, MD   3 Units at 07/22/24 1825   insulin  aspart (novoLOG ) injection 0-5 Units  0-5 Units Subcutaneous QHS Laurita Manor T, MD       lactulose (CHRONULAC) 10 GM/15ML solution 10 g  10 g Oral BID Dezii, Alexandra, DO   10 g at 07/23/24 0831   lamoTRIgine  (LAMICTAL ) tablet 150 mg  150 mg Oral BID Laurita Manor T, MD   150 mg at 07/23/24 9166   levETIRAcetam  (KEPPRA ) tablet 250 mg  250 mg Oral BID Laurita Manor T, MD   250 mg at 07/23/24 9166   levothyroxine  (SYNTHROID ) tablet 112 mcg  112 mcg Oral QAC breakfast Laurita Manor T, MD   112 mcg at 07/23/24 9356   lithium  carbonate capsule 150 mg  150 mg Oral Daily Laurita Manor T, MD   150 mg at 07/23/24 9167   loratadine  (CLARITIN ) tablet 10 mg  10 mg Oral Daily Laurita Manor T, MD   10 mg at 07/23/24 0833   metFORMIN (GLUCOPHAGE) tablet 500 mg  500 mg Oral BID WC Zhang, Ping T, MD   500 mg at 07/23/24 9166   nicotine  (NICODERM CQ  - dosed in mg/24 hours) patch 21 mg  21 mg Transdermal Daily Zhang, Ping T, MD   21 mg at 07/23/24 9164   ondansetron  (ZOFRAN ) tablet 4 mg  4 mg Oral Q8H PRN Laurita Manor T, MD       pravastatin  (PRAVACHOL ) tablet 40 mg  40 mg Oral Daily Zhang, Ping T, MD   40 mg at 07/23/24 9167   risperiDONE  (RISPERDAL ) tablet 3 mg  3 mg Oral QHS Laurita Manor T, MD   3 mg at 07/22/24 2138   rOPINIRole  (REQUIP ) tablet 1 mg  1 mg Oral QHS Laurita Manor T, MD   1  mg at 07/22/24 2138   valbenazine (INGREZZA) capsule 80 mg  80 mg Oral QHS Zhang, Ping T, MD   80 mg at 07/22/24 2137     Discharge Medications: Please see discharge summary for a  list of discharge medications.  Relevant Imaging Results:  Relevant Lab Results:   Additional Information SS# [id number]  Daved JONETTA Hamilton, RN

## 2024-07-24 LAB — BLOOD GAS, VENOUS
Acid-base deficit: 1.1 mmol/L (ref 0.0–2.0)
Bicarbonate: 26.4 mmol/L (ref 20.0–28.0)
O2 Saturation: 48.3 %
Patient temperature: 37
pCO2, Ven: 55 mmHg (ref 44–60)
pH, Ven: 7.29 (ref 7.25–7.43)

## 2024-07-27 ENCOUNTER — Other Ambulatory Visit: Payer: Self-pay

## 2024-07-27 ENCOUNTER — Observation Stay: Admission: EM | Admit: 2024-07-27 | Discharge: 2024-07-29 | Attending: Hospitalist | Admitting: Hospitalist

## 2024-07-27 ENCOUNTER — Emergency Department

## 2024-07-27 ENCOUNTER — Observation Stay

## 2024-07-27 DIAGNOSIS — R4182 Altered mental status, unspecified: Principal | ICD-10-CM | POA: Insufficient documentation

## 2024-07-27 DIAGNOSIS — Z79899 Other long term (current) drug therapy: Secondary | ICD-10-CM | POA: Insufficient documentation

## 2024-07-27 DIAGNOSIS — I130 Hypertensive heart and chronic kidney disease with heart failure and stage 1 through stage 4 chronic kidney disease, or unspecified chronic kidney disease: Secondary | ICD-10-CM | POA: Insufficient documentation

## 2024-07-27 DIAGNOSIS — Z87891 Personal history of nicotine dependence: Secondary | ICD-10-CM | POA: Insufficient documentation

## 2024-07-27 DIAGNOSIS — R410 Disorientation, unspecified: Principal | ICD-10-CM

## 2024-07-27 DIAGNOSIS — Z8719 Personal history of other diseases of the digestive system: Secondary | ICD-10-CM | POA: Insufficient documentation

## 2024-07-27 DIAGNOSIS — G9341 Metabolic encephalopathy: Secondary | ICD-10-CM | POA: Insufficient documentation

## 2024-07-27 DIAGNOSIS — N182 Chronic kidney disease, stage 2 (mild): Secondary | ICD-10-CM | POA: Insufficient documentation

## 2024-07-27 DIAGNOSIS — Z9981 Dependence on supplemental oxygen: Secondary | ICD-10-CM | POA: Insufficient documentation

## 2024-07-27 DIAGNOSIS — K5900 Constipation, unspecified: Secondary | ICD-10-CM | POA: Insufficient documentation

## 2024-07-27 DIAGNOSIS — J9611 Chronic respiratory failure with hypoxia: Secondary | ICD-10-CM | POA: Insufficient documentation

## 2024-07-27 DIAGNOSIS — R531 Weakness: Secondary | ICD-10-CM

## 2024-07-27 DIAGNOSIS — F319 Bipolar disorder, unspecified: Secondary | ICD-10-CM | POA: Insufficient documentation

## 2024-07-27 DIAGNOSIS — I509 Heart failure, unspecified: Secondary | ICD-10-CM | POA: Insufficient documentation

## 2024-07-27 DIAGNOSIS — G40909 Epilepsy, unspecified, not intractable, without status epilepticus: Secondary | ICD-10-CM | POA: Insufficient documentation

## 2024-07-27 DIAGNOSIS — E1122 Type 2 diabetes mellitus with diabetic chronic kidney disease: Secondary | ICD-10-CM | POA: Insufficient documentation

## 2024-07-27 DIAGNOSIS — F0390 Unspecified dementia without behavioral disturbance: Secondary | ICD-10-CM | POA: Insufficient documentation

## 2024-07-27 LAB — BLOOD GAS, VENOUS
Acid-Base Excess: 4.3 mmol/L — ABNORMAL HIGH (ref 0.0–2.0)
Bicarbonate: 31.5 mmol/L — ABNORMAL HIGH (ref 20.0–28.0)
O2 Saturation: 58.2 %
Patient temperature: 37
pCO2, Ven: 57 mmHg (ref 44–60)
pH, Ven: 7.35 (ref 7.25–7.43)
pO2, Ven: 35 mmHg (ref 32–45)

## 2024-07-27 LAB — CBC WITH DIFFERENTIAL/PLATELET
Abs Immature Granulocytes: 0.04 K/uL (ref 0.00–0.07)
Basophils Absolute: 0 K/uL (ref 0.0–0.1)
Basophils Relative: 0 %
Eosinophils Absolute: 0.2 K/uL (ref 0.0–0.5)
Eosinophils Relative: 3 %
HCT: 43.2 % (ref 36.0–46.0)
Hemoglobin: 13.6 g/dL (ref 12.0–15.0)
Immature Granulocytes: 1 %
Lymphocytes Relative: 13 %
Lymphs Abs: 0.9 K/uL (ref 0.7–4.0)
MCH: 30.3 pg (ref 26.0–34.0)
MCHC: 31.5 g/dL (ref 30.0–36.0)
MCV: 96.2 fL (ref 80.0–100.0)
Monocytes Absolute: 0.4 K/uL (ref 0.1–1.0)
Monocytes Relative: 6 %
Neutro Abs: 5.2 K/uL (ref 1.7–7.7)
Neutrophils Relative %: 77 %
Platelets: 188 K/uL (ref 150–400)
RBC: 4.49 MIL/uL (ref 3.87–5.11)
RDW: 12.3 % (ref 11.5–15.5)
WBC: 6.8 K/uL (ref 4.0–10.5)
nRBC: 0 % (ref 0.0–0.2)

## 2024-07-27 LAB — URINALYSIS, ROUTINE W REFLEX MICROSCOPIC
Bilirubin Urine: NEGATIVE
Glucose, UA: >=500 mg/dL — AB
Hgb urine dipstick: NEGATIVE
Ketones, ur: NEGATIVE mg/dL
Nitrite: NEGATIVE
Protein, ur: NEGATIVE mg/dL
Specific Gravity, Urine: 1.023 (ref 1.005–1.030)
pH: 5 (ref 5.0–8.0)

## 2024-07-27 LAB — VITAMIN B12: Vitamin B-12: 283 pg/mL (ref 180–914)

## 2024-07-27 LAB — RESP PANEL BY RT-PCR (RSV, FLU A&B, COVID)  RVPGX2
Influenza A by PCR: NEGATIVE
Influenza B by PCR: NEGATIVE
Resp Syncytial Virus by PCR: NEGATIVE
SARS Coronavirus 2 by RT PCR: NEGATIVE

## 2024-07-27 LAB — COMPREHENSIVE METABOLIC PANEL WITH GFR
ALT: 27 U/L (ref 0–44)
AST: 29 U/L (ref 15–41)
Albumin: 3.5 g/dL (ref 3.5–5.0)
Alkaline Phosphatase: 64 U/L (ref 38–126)
Anion gap: 8 (ref 5–15)
BUN: 27 mg/dL — ABNORMAL HIGH (ref 8–23)
CO2: 27 mmol/L (ref 22–32)
Calcium: 9.5 mg/dL (ref 8.9–10.3)
Chloride: 109 mmol/L (ref 98–111)
Creatinine, Ser: 1.04 mg/dL — ABNORMAL HIGH (ref 0.44–1.00)
GFR, Estimated: >60 mL/min (ref >60)
Glucose, Bld: 128 mg/dL — ABNORMAL HIGH (ref 70–99)
Potassium: 4.3 mmol/L (ref 3.5–5.1)
Sodium: 144 mmol/L (ref 135–145)
Total Bilirubin: 0.6 mg/dL (ref 0.0–1.2)
Total Protein: 7 g/dL (ref 6.5–8.1)

## 2024-07-27 LAB — LACTIC ACID, PLASMA: Lactic Acid, Venous: 1.3 mmol/L (ref 0.5–1.9)

## 2024-07-27 LAB — PROCALCITONIN: Procalcitonin: <0.1 ng/mL

## 2024-07-27 LAB — LITHIUM LEVEL: Lithium Lvl: <0.1 mmol/L — ABNORMAL LOW (ref 0.60–1.20)

## 2024-07-27 LAB — GLUCOSE, CAPILLARY: Glucose-Capillary: 130 mg/dL — ABNORMAL HIGH (ref 70–99)

## 2024-07-27 LAB — TSH: TSH: 5.716 u[IU]/mL — ABNORMAL HIGH (ref 0.350–4.500)

## 2024-07-27 LAB — TROPONIN I (HIGH SENSITIVITY): Troponin I (High Sensitivity): 7 ng/L (ref <18)

## 2024-07-27 MED ORDER — RISPERIDONE 0.5 MG PO TABS
0.5000 mg | ORAL_TABLET | Freq: Two times a day (BID) | ORAL | Status: DC
  Administered 2024-07-27 – 2024-07-29 (×4): 0.5 mg via ORAL
  Filled 2024-07-27 (×4): qty 1

## 2024-07-27 MED ORDER — INSULIN ASPART 100 UNIT/ML IJ SOLN
0.0000 [IU] | Freq: Three times a day (TID) | INTRAMUSCULAR | Status: DC
  Administered 2024-07-27: 1 [IU] via SUBCUTANEOUS
  Administered 2024-07-28: 3 [IU] via SUBCUTANEOUS
  Filled 2024-07-27: qty 1

## 2024-07-27 MED ORDER — LORATADINE 10 MG PO TABS
10.0000 mg | ORAL_TABLET | Freq: Every day | ORAL | Status: DC
  Administered 2024-07-27 – 2024-07-29 (×3): 10 mg via ORAL
  Filled 2024-07-27 (×3): qty 1

## 2024-07-27 MED ORDER — ONDANSETRON HCL 4 MG/2ML IJ SOLN
4.0000 mg | INTRAMUSCULAR | Status: AC
  Administered 2024-07-27: 4 mg via INTRAVENOUS
  Filled 2024-07-27: qty 2

## 2024-07-27 MED ORDER — CARVEDILOL 3.125 MG PO TABS
3.1250 mg | ORAL_TABLET | Freq: Two times a day (BID) | ORAL | Status: DC
  Administered 2024-07-27 – 2024-07-29 (×4): 3.125 mg via ORAL
  Filled 2024-07-27 (×4): qty 1

## 2024-07-27 MED ORDER — LEVETIRACETAM 250 MG PO TABS
250.0000 mg | ORAL_TABLET | Freq: Two times a day (BID) | ORAL | Status: DC
  Administered 2024-07-27 – 2024-07-29 (×4): 250 mg via ORAL
  Filled 2024-07-27 (×4): qty 1

## 2024-07-27 MED ORDER — SENNOSIDES-DOCUSATE SODIUM 8.6-50 MG PO TABS
1.0000 | ORAL_TABLET | Freq: Two times a day (BID) | ORAL | Status: DC
Start: 2024-07-27 — End: 2024-07-27

## 2024-07-27 MED ORDER — LACTULOSE 10 GM/15ML PO SOLN
30.0000 g | Freq: Two times a day (BID) | ORAL | Status: AC
Start: 2024-07-27 — End: 2024-07-29
  Administered 2024-07-27 – 2024-07-28 (×4): 30 g via ORAL
  Filled 2024-07-27 (×4): qty 60

## 2024-07-27 MED ORDER — LEVOTHYROXINE SODIUM 112 MCG PO TABS
112.0000 ug | ORAL_TABLET | Freq: Every day | ORAL | Status: DC
  Administered 2024-07-28 – 2024-07-29 (×2): 112 ug via ORAL
  Filled 2024-07-27 (×2): qty 1

## 2024-07-27 MED ORDER — VALBENAZINE TOSYLATE 40 MG PO CAPS
80.0000 mg | ORAL_CAPSULE | Freq: Every day | ORAL | Status: DC
  Administered 2024-07-28 – 2024-07-29 (×2): 80 mg via ORAL
  Filled 2024-07-27 (×2): qty 2

## 2024-07-27 MED ORDER — METFORMIN HCL 500 MG PO TABS
500.0000 mg | ORAL_TABLET | Freq: Two times a day (BID) | ORAL | Status: DC
  Administered 2024-07-27 – 2024-07-29 (×4): 500 mg via ORAL
  Filled 2024-07-27 (×4): qty 1

## 2024-07-27 MED ORDER — EMPAGLIFLOZIN 25 MG PO TABS: 25.0000 mg | ORAL_TABLET | Freq: Every day | ORAL | Status: DC

## 2024-07-27 MED ORDER — LAMOTRIGINE 25 MG PO TABS
150.0000 mg | ORAL_TABLET | Freq: Two times a day (BID) | ORAL | Status: DC
  Administered 2024-07-27 – 2024-07-29 (×4): 150 mg via ORAL
  Filled 2024-07-27 (×4): qty 2

## 2024-07-27 MED ORDER — ACETAMINOPHEN 325 MG PO TABS: 650.0000 mg | ORAL_TABLET | Freq: Four times a day (QID) | ORAL | Status: DC | PRN

## 2024-07-27 MED ORDER — PRAVASTATIN SODIUM 20 MG PO TABS
40.0000 mg | ORAL_TABLET | Freq: Every evening | ORAL | Status: DC
  Administered 2024-07-27 – 2024-07-28 (×2): 40 mg via ORAL
  Filled 2024-07-27 (×2): qty 2

## 2024-07-27 MED ORDER — ENOXAPARIN SODIUM 40 MG/0.4ML IJ SOSY
40.0000 mg | PREFILLED_SYRINGE | INTRAMUSCULAR | Status: DC
  Administered 2024-07-27 – 2024-07-28 (×2): 40 mg via SUBCUTANEOUS
  Filled 2024-07-27 (×2): qty 0.4

## 2024-07-27 MED ORDER — BUDESON-GLYCOPYRROL-FORMOTEROL 160-9-4.8 MCG/ACT IN AERO
2.0000 | INHALATION_SPRAY | Freq: Two times a day (BID) | RESPIRATORY_TRACT | Status: DC
  Administered 2024-07-27 – 2024-07-29 (×4): 2 via RESPIRATORY_TRACT
  Filled 2024-07-27: qty 5.9

## 2024-07-27 MED ORDER — LEVETIRACETAM (KEPPRA) 500 MG/5 ML ADULT IV PUSH
250.0000 mg | INTRAVENOUS | Status: AC
  Administered 2024-07-27: 250 mg via INTRAVENOUS
  Filled 2024-07-27: qty 5

## 2024-07-27 MED ORDER — ONDANSETRON HCL 4 MG PO TABS: 4.0000 mg | ORAL_TABLET | Freq: Three times a day (TID) | ORAL | Status: DC | PRN

## 2024-07-27 MED ORDER — ACETAMINOPHEN 325 MG RE SUPP: 650.0000 mg | Freq: Four times a day (QID) | RECTAL | Status: DC | PRN

## 2024-07-27 MED ORDER — SODIUM CHLORIDE 0.9 % IV BOLUS
500.0000 mL | Freq: Once | INTRAVENOUS | Status: AC
  Administered 2024-07-27: 500 mL via INTRAVENOUS

## 2024-07-27 MED ORDER — BUSPIRONE HCL 10 MG PO TABS
30.0000 mg | ORAL_TABLET | Freq: Two times a day (BID) | ORAL | Status: DC
  Administered 2024-07-27 – 2024-07-29 (×4): 30 mg via ORAL
  Filled 2024-07-27 (×4): qty 3

## 2024-07-27 MED ORDER — ALBUTEROL SULFATE (2.5 MG/3ML) 0.083% IN NEBU: 2.5000 mg | INHALATION_SOLUTION | Freq: Four times a day (QID) | RESPIRATORY_TRACT | Status: DC | PRN

## 2024-07-27 MED ORDER — FINERENONE 20 MG PO TABS: 1.0000 | ORAL_TABLET | Freq: Every day | ORAL | Status: DC

## 2024-07-27 MED ORDER — FLUTICASONE PROPIONATE 50 MCG/ACT NA SUSP
1.0000 | Freq: Every day | NASAL | Status: DC
  Administered 2024-07-28 – 2024-07-29 (×2): 1 via NASAL
  Filled 2024-07-27: qty 16

## 2024-07-27 MED ORDER — BUTALBITAL-APAP-CAFFEINE 50-325-40 MG PO TABS
1.0000 | ORAL_TABLET | Freq: Four times a day (QID) | ORAL | Status: DC | PRN
  Administered 2024-07-27 – 2024-07-28 (×2): 1 via ORAL
  Filled 2024-07-27 (×2): qty 1

## 2024-07-27 MED ORDER — LITHIUM CARBONATE 150 MG PO CAPS
150.0000 mg | ORAL_CAPSULE | Freq: Every day | ORAL | Status: DC
  Administered 2024-07-27 – 2024-07-28 (×2): 150 mg via ORAL
  Filled 2024-07-27 (×3): qty 1

## 2024-07-27 MED ORDER — IOHEXOL 300 MG/ML  SOLN
100.0000 mL | Freq: Once | INTRAMUSCULAR | Status: AC | PRN
  Administered 2024-07-27: 100 mL via INTRAVENOUS

## 2024-07-27 MED ORDER — FAMOTIDINE 20 MG PO TABS
40.0000 mg | ORAL_TABLET | Freq: Every day | ORAL | Status: DC
  Administered 2024-07-27 – 2024-07-29 (×3): 40 mg via ORAL
  Filled 2024-07-27 (×3): qty 2

## 2024-07-27 MED ORDER — POLYETHYLENE GLYCOL 3350 17 G PO PACK
17.0000 g | PACK | Freq: Every day | ORAL | Status: DC | PRN
  Administered 2024-07-28 (×2): 17 g via ORAL

## 2024-07-27 NOTE — Plan of Care (Signed)
  Problem: Fluid Volume: Goal: Ability to maintain a balanced intake and output will improve Outcome: Progressing   Problem: Nutritional: Goal: Maintenance of adequate nutrition will improve Outcome: Progressing   Problem: Activity: Goal: Risk for activity intolerance will decrease Outcome: Progressing

## 2024-07-27 NOTE — H&P (Signed)
 History and Physical    Brittain Smithey Quast FMW:[id number] DOB: 11-15-1960 DOA: 07/27/2024  PCP: Donal Channing SQUIBB, FNP (Confirm with patient/family/NH records and if not entered, this has to be entered at [facility] point of entry) Patient coming from: Group home  I have personally briefly reviewed patient's old medical records in [facility]  Chief Complaint: AMS  HPI: Tammy [name] is a 63 y.o. female with medical history significant of COPD with chronic hypoxic respiratory failure on 2 L continuously, IIDM, chronic HFpEF, CKD stage II, seizure disorder, anxiety/depression, dementia, was found confused this morning.  Patient was found by facility staff to be confused and sent to ED.  Patient denied any pain, no shortness of breath no dysuria.  She was recently hospitalized for COPD exacerbation and pneumonia and discharged on Levaquin . ED Course: Afebrile, not tachycardia not hypotension not hypoxic.  CT head negative for acute findings, CT abdomen pelvis showed retained stool no signs of SBO.  Blood work showed hemoglobin 13.6 WBC 6.8 VBG 7.30 5/57/35 BUN 27 creatinine 1.0 bicarb 27.  Chest x-ray negative for acute infiltrates.  UA negative for UTI.  Review of Systems: As per HPI otherwise 14 point review of systems negative.    Past Medical History:  Diagnosis Date   Anxiety    Asthma    Bipolar 1 disorder (HCC)    CHF (congestive heart failure) (HCC)    COPD (chronic obstructive pulmonary disease) (HCC)    Dementia (HCC)    Diabetes mellitus without complication (HCC)    Epilepsy (HCC)    Seizures (HCC)     Past Surgical History:  Procedure Laterality Date   APPENDECTOMY     hernia reapir     x 3       reports that she quit smoking about 25 years ago. Her smoking use included cigarettes. She has never used smokeless tobacco. She reports that she does not drink alcohol and does not use drugs.  Allergies  Allergen Reactions   Penicillins Rash    Has patient had a  PCN reaction causing immediate rash, facial/tongue/throat swelling, SOB or lightheadedness with hypotension: No Has patient had a PCN reaction causing severe rash involving mucus membranes or skin necrosis: No Has patient had a PCN reaction that required hospitalization: No Has patient had a PCN reaction occurring within the last 10 years: No If all of the above answers are NO, then may proceed with Cephalosporin use.    Prednisone  Rash    Family History  Problem Relation Age of Onset   Hypertension Mother    Kidney cancer Neg Hx    Bladder Cancer Neg Hx    Breast cancer Neg Hx      Prior to Admission medications   Medication Sig Start Date End Date Taking? Authorizing Provider  albuterol  (PROVENTIL ) (2.5 MG/3ML) 0.083% nebulizer solution Take 2.5 mg by nebulization in the morning and at bedtime.    [provider]  albuterol  (VENTOLIN  HFA) 108 (90 Base) MCG/ACT inhaler Inhale 1-2 puffs into the lungs every 6 (six) hours as needed for wheezing or shortness of breath.    [provider]  aluminum -magnesium  hydroxide-simethicone  (MAALOX) 200-200-20 MG/5ML SUSP Take 30 mLs by mouth 4 (four) times daily -  before meals and at bedtime. Patient not taking: Reported on 07/21/2024 09/07/23   Viviann Pastor, MD  b complex vitamins capsule Take 1 capsule by mouth daily. Every other day    [provider]  busPIRone  (BUSPAR ) 30 MG tablet  Take 30 mg by mouth 2 (two) times daily.     [provider]  carvedilol  (COREG ) 3.125 MG tablet Take 3.125 mg by mouth 2 (two) times daily with a meal.     [provider]  cetirizine (ZYRTEC) 10 MG tablet Take 10 mg by mouth daily.    [provider]  cholecalciferol (VITAMIN D3) 25 MCG (1000 UNIT) tablet Take 5,000 Units by mouth daily.    [provider]  Cyanocobalamin  1000 MCG SUBL Place 1 tablet (1,000 mcg total) under the tongue daily. Patient not taking: Reported on 07/21/2024 07/01/19    Brahmanday, Govinda R, MD  empagliflozin  (JARDIANCE ) 25 MG TABS tablet Take 25 mg by mouth daily.    [provider]  famotidine  (PEPCID ) 40 MG tablet Take 40 mg by mouth daily.    [provider]  ferrous sulfate  325 (65 FE) MG EC tablet Take 1 tablet (325 mg total) by mouth daily with breakfast. 08/20/19   Brahmanday, Govinda R, MD  fluticasone  (FLONASE ) 50 MCG/ACT nasal spray Place 1 spray into both nostrils daily.     [provider]  KERENDIA  20 MG TABS Take 1 tablet by mouth daily.    [provider]  lamoTRIgine  (LAMICTAL ) 150 MG tablet Take 150 mg by mouth 2 (two) times daily.    [provider]  levETIRAcetam  (KEPPRA ) 250 MG tablet Take 250 mg by mouth 2 (two) times daily.    [provider]  levothyroxine  (SYNTHROID ) 112 MCG tablet Take 112 mcg by mouth daily before breakfast.    [provider]  lithium  carbonate 150 MG capsule Take 150 mg by mouth daily.     [provider]  Menatetrenone (VITAMIN K2) 100 MCG TABS Take 100 mcg by mouth daily.    [provider]  metFORMIN (GLUCOPHAGE) 500 MG tablet Take 500 mg by mouth 2 (two) times daily with a meal.    [provider]  nicotine  (NICODERM CQ  - DOSED IN MG/24 HOURS) 21 mg/24hr patch Place 1 patch (21 mg total) onto the skin daily. Patient not taking: Reported on 07/21/2024 08/17/23   Barbarann Nest, MD  nitrofurantoin , macrocrystal-monohydrate, (MACROBID ) 100 MG capsule Take 1 capsule (100 mg total) by mouth daily. 07/23/24 08/22/24  Dezii, Alexandra, DO  Omega-3 Fatty Acids (FISH OIL) 1000 MG CAPS Take 1,000 mg by mouth 2 (two) times daily.     [provider]  ondansetron  (ZOFRAN ) 4 MG tablet Take 1 tablet (4 mg total) by mouth every 8 (eight) hours as needed for nausea or vomiting. 09/06/23   Cyrena Mylar, MD  polyethylene glycol (MIRALAX  / GLYCOLAX ) 17 g packet Take 17 g by mouth daily as needed.    [provider]   pravastatin  (PRAVACHOL ) 40 MG tablet Take 40 mg by mouth daily.    [provider]  risperiDONE  (RISPERDAL ) 0.5 MG tablet Take 0.5 mg by mouth 2 (two) times daily.    [provider]  TRELEGY ELLIPTA 100-62.5-25 MCG/INH AEPB Inhale 1 puff into the lungs daily.  11/17/18   [provider]  valbenazine (INGREZZA) 80 MG capsule Take 1 capsule (80 mg total) by mouth daily. 07/23/24   Leesa Kast, DO    Physical Exam: Vitals:   07/27/24 1030 07/27/24 1100 07/27/24 1130 07/27/24 1200  BP: 133/66 120/61 (!) 113/55 (!) 120/54  Pulse: 63 60 (!) 55 (!) 58  Resp: 19 18 13 17   Temp:      TempSrc:  SpO2: 100% 100% 100% 100%  Weight:      Height:        Constitutional: NAD, calm, comfortable Vitals:   07/27/24 1030 07/27/24 1100 07/27/24 1130 07/27/24 1200  BP: 133/66 120/61 (!) 113/55 (!) 120/54  Pulse: 63 60 (!) 55 (!) 58  Resp: 19 18 13 17   Temp:      TempSrc:      SpO2: 100% 100% 100% 100%  Weight:      Height:       Eyes: PERRL, lids and conjunctivae normal ENMT: Mucous membranes are moist. Posterior pharynx clear of any exudate or lesions.Normal dentition.  Neck: normal, supple, no masses, no thyromegaly Respiratory: clear to auscultation bilaterally, no wheezing, no crackles. Normal respiratory effort. No accessory muscle use.  Cardiovascular: Regular rate and rhythm, no murmurs / rubs / gallops. No extremity edema. 2+ pedal pulses. No carotid bruits.  Abdomen: no tenderness, no masses palpated. No hepatosplenomegaly. Bowel sounds positive.  Musculoskeletal: no clubbing / cyanosis. No joint deformity upper and lower extremities. Good ROM, no contractures. Normal muscle tone.  Skin: no rashes, lesions, ulcers. No induration Neurologic: CN 2-12 grossly intact. Sensation intact, DTR normal. Strength 5/5 in all 4.  Psychiatric: Normal judgment and insight.  Oriented to person and place, confused about time    Labs on Admission: I have personally  reviewed following labs and imaging studies  CBC: Recent Labs  Lab 07/21/24 0347 07/22/24 0357 07/27/24 0727  WBC 8.6 11.0* 6.8  NEUTROABS 6.3  --  5.2  HGB 13.9 13.5 13.6  HCT 42.8 41.3 43.2  MCV 94.3 95.6 96.2  PLT 170 155 188   Basic Metabolic Panel: Recent Labs  Lab 07/21/24 0347 07/22/24 0357 07/27/24 0727  NA 142 142 144  K 4.2 4.5 4.3  CL 106 108 109  CO2 24 24 27   GLUCOSE 136* 136* 128*  BUN 32* 31* 27*  CREATININE 1.22* 0.88 1.04*  CALCIUM  9.7 9.5 9.5   GFR: Estimated Creatinine Clearance: 49.8 mL/min (A) (by C-G formula based on SCr of 1.04 mg/dL (H)). Liver Function Tests: Recent Labs  Lab 07/21/24 0347 07/27/24 0727  AST 28 29  ALT 21 27  ALKPHOS 65 64  BILITOT 0.7 0.6  PROT 6.6 7.0  ALBUMIN 3.9 3.5   Recent Labs  Lab 07/21/24 0347  LIPASE 33   No results for input(s): AMMONIA in the last 168 hours. Coagulation Profile: No results for input(s): INR, PROTIME in the last 168 hours. Cardiac Enzymes: No results for input(s): CKTOTAL, CKMB, CKMBINDEX, TROPONINI in the last 168 hours. BNP (last 3 results) No results for input(s): PROBNP in the last 8760 hours. HbA1C: No results for input(s): HGBA1C in the last 72 hours. CBG: Recent Labs  Lab 07/22/24 1314 07/22/24 1818 07/22/24 2131 07/23/24 0801 07/23/24 1234  GLUCAP 216* 167* 143* 104* 102*   Lipid Profile: No results for input(s): CHOL, HDL, LDLCALC, TRIG, CHOLHDL, LDLDIRECT in the last 72 hours. Thyroid  Function Tests: Recent Labs    07/27/24 0727  TSH 5.716*   Anemia Panel: No results for input(s): VITAMINB12, FOLATE, FERRITIN, TIBC, IRON, RETICCTPCT in the last 72 hours. Urine analysis:    Component Value Date/Time   COLORURINE YELLOW (A) 07/27/2024 0728   APPEARANCEUR CLEAR (A) 07/27/2024 0728   LABSPEC 1.023 07/27/2024 0728   PHURINE 5.0 07/27/2024 0728   GLUCOSEU >=500 (A) 07/27/2024 0728   HGBUR NEGATIVE 07/27/2024 0728    BILIRUBINUR NEGATIVE 07/27/2024 0728   BILIRUBINUR negative 10/10/2023  1750   KETONESUR NEGATIVE 07/27/2024 0728   PROTEINUR NEGATIVE 07/27/2024 0728   UROBILINOGEN 0.2 10/10/2023 1750   NITRITE NEGATIVE 07/27/2024 0728   LEUKOCYTESUR TRACE (A) 07/27/2024 0728    Radiological Exams on Admission: CT ABDOMEN PELVIS W CONTRAST Result Date: 07/27/2024 EXAM: CT ABDOMEN AND PELVIS WITH CONTRAST 07/27/2024 09:41:06 AM TECHNIQUE: CT of the abdomen and pelvis was performed with the administration of 100 mL of iohexol  (OMNIPAQUE ) 300 MG/ML solution. Multiplanar reformatted images are provided for review. Automated exposure control, iterative reconstruction, and/or weight-based adjustment of the mA/kV was utilized to reduce the radiation dose to as low as reasonably achievable. COMPARISON: CT 09/06/2023. CT abdomen and pelvis 09/07/2017. CLINICAL HISTORY: 63 year old female with LLQ abdominal pain. FINDINGS: LOWER CHEST: Mild motion artifact at the lung bases which appears stable and negative. LIVER: Stable liver since 2018. GALLBLADDER AND BILE DUCTS: Chronic cholecystectomy. No biliary ductal dilatation. SPLEEN: Stable spleen since 2018. PANCREAS: No acute abnormality. ADRENAL GLANDS: Chronic adrenal gland thickening also appears stable since 2018 and benign. KIDNEYS, URETERS AND BLADDER: No stones in the kidneys or ureters. No hydronephrosis. No perinephric or periureteral stranding. Mildly to moderately distended but otherwise unremarkable urinary bladder. GI AND BOWEL: Abundant large bowel retained stool and generalized large bowel redundancy. The cecum is on lax mesentery. Appendix is diminutive or absent. No dilated small bowel. Chronic postoperative changes to the stomach. Chronic possible vascular stent in the distal stomach and proximal duodenum which appears unchanged from last year and from a CT abdomen and pelvis 09/07/2017. PERITONEUM AND RETROPERITONEUM: No ascites. No free air. No pelvis free fluid.  VASCULATURE: Advanced calcified atherosclerosis of the aorta and iliac arteries. Negative for abdominal aortic aneurysm. Major arterial structures remain patent despite atherosclerosis. Portal venous system appears to be patent. LYMPH NODES: No lymphadenopathy. REPRODUCTIVE ORGANS: No acute abnormality. BONES AND SOFT TISSUES: Chronic lumbar disc degeneration with vacuum disc. Chronic ventral abdominal postoperative changes are stable with no adverse features. No acute osseous abnormality. IMPRESSION: 1. Large bowel redundancy with abundant retained stool. Chronic gastric bypass appears stable. Urinary bladder distention. Advanced aortoiliac atherosclerosis. 2. No inflammatory process identified in the abdomen or pelvis. Electronically signed by: Helayne Hurst MD 07/27/2024 09:58 AM EDT RP Workstation: [id number]   CT Head Wo Contrast Result Date: 07/27/2024 EXAM: CT HEAD WITHOUT CONTRAST 07/27/2024 09:41:06 AM TECHNIQUE: CT of the head was performed without the administration of intravenous contrast. Automated exposure control, iterative reconstruction, and/or weight based adjustment of the mA/kV was utilized to reduce the radiation dose to as low as reasonably achievable. COMPARISON: None available. CLINICAL HISTORY: Delirium. Altered Mental Status. FINDINGS: BRAIN AND VENTRICLES: No acute hemorrhage. No evidence of acute infarct. No hydrocephalus. No extra-axial collection. No mass effect or midline shift. Atherosclerotic calcification of internal carotid arteries at skull base. ORBITS: No acute abnormality. SINUSES: No acute abnormality. SOFT TISSUES AND SKULL: No acute soft tissue abnormality. No skull fracture. IMPRESSION: 1. No acute intracranial abnormality. Electronically signed by: Helayne Hurst MD 07/27/2024 09:51 AM EDT RP Workstation: [id number]   DG Chest Portable 1 View Result Date: 07/27/2024 CLINICAL DATA:  Altered mental status EXAM: PORTABLE CHEST - 1 VIEW COMPARISON:  07/21/2024 FINDINGS:  Unchanged mild cardiomegaly. No pulmonary vascular congestion. Patchy opacities in the RIGHT mid lung again seen likely relates to pneumonitis. IMPRESSION: Unchanged patchy opacities of the RIGHT mid lung, most consistent with pneumonia. Electronically Signed   By: Aliene Lloyd M.D.   On: 07/27/2024 08:11    EKG: Independently reviewed.  Sinus rhythm, no acute ST-T changes  Assessment/Plan Principal Problem:   AMS (altered mental status) Active Problems:   Acute metabolic encephalopathy  (please populate well all problems here in Problem List. (For example, if patient is on BP meds at home and you resume or decide to hold them, it is a problem that needs to be her. Same for CAD, COPD, HLD and so on)  AMS Acute metabolic encephalopathy - Etiology unknown.  Differential including drug drug interaction of Levaquin  and seizure medications, and possibly nitrofurantoin , breakthrough seizure, hypoglycemia. [place] Sous through patient's medication list, patient was discharged on Levaquin  which can potentially lower seizure threshold.  Will check EEG.  Discontinue Levaquin . - Patient takes Jardiance  and metformin for diabetes, most recent A1c 5.9, borderline low for diabetic control.  Will hold off Jardiance , the patient on metformin therapy. - Also noticed patient has been taking Macrobid  which is contraindicated in her kidney function status.  Will discontinue nitrofurantoin .  History of COPD with chronic hypoxic respiratory failure - No acute concern, no wheezing or crackles on listening.  On baseline 2 L oxygen - Continue as needed breathing meds  Abdominal pain - Appears to related to constipation, add stool softener and laxative - Abdominal exam benign, start lactulose twice daily x 2 days  Seizure disorder - Continue Keppra  and lamotrigine   History of dysphagia - On dysphagia 1 diet  IIDM - As above  CKD stage II - As above, discontinue Macrobid  - Euvolemic.  Chronic lithium   therapy - Lithium  level was checked which is within normal limits.  DVT prophylaxis: Lovenox  Code Status: Full code Family Communication: None at bedside Disposition Plan: Expect less than 2 midnight hospital stay Consults called: None Admission status: Telemetry observation   Cort ONEIDA Mana MD [facility] Pager [phone]  07/27/2024, 12:38 PM

## 2024-07-27 NOTE — ED Triage Notes (Signed)
 Pt to ED via [facility] from group home. Sudden onset of AMS about 2 hours ago. EMS reports ambulatory on scene. Pt recently dx with pneumonia. Pt oriented to self and place.  Pt denies complaints at this time. EMS vitals  BP 131/60  SPO2 96% 2L chronic  HR 64 CBG 127  etco2 41  RR 17

## 2024-07-27 NOTE — ED Notes (Signed)
 Lab called to collect blood cultures. Multiple attempts by multiple RNs.

## 2024-07-27 NOTE — ED Notes (Signed)
 This NT assisted Pt to the bathroom and returned pt to the bed. Fall bundle remains in place, Pt provided with warm blankets and lighting dimmed per Pt request. Pt denies any further needs at this time.

## 2024-07-27 NOTE — Progress Notes (Signed)
 Eeg done

## 2024-07-27 NOTE — ED Provider Notes (Signed)
 [facility] Provider Note    Event Date/Time   First MD Initiated Contact with Patient 07/27/24 [phone]     (approximate)   History   Altered Mental Status  EM caveat: Dementia, altered mental status  HPI  Tammy [name] is a 63 y.o. female  COPD, chronic hypoxic respiratory failure on 2 L, diabetes, chronic heart failure preserved EF, seizure disorder, anxiety, depression, dementia.  Reviewed discharge summary from October 17 patient treated for multifocal pneumonia and COPD but was not treated with prednisone  due to allergy concern treated outpatient with Levaquin    EMS reports they were called for concerns of change in mental status about 2 hours ago.  Patient reports only thing bothering her right now is stomach pain mostly in her left lower abdomen.  She says it has been there since she was in the hospital, recalls being in the hospital last week for pneumonia.  She does not know the year, but gives her name easily, she is not sure exactly what hospital she is in but she does recall being in the hospital for pneumonia about a week ago.  No shortness of breath no chest pain no headache.  Reports only concern she has feeling a little nauseated having pain in the left lower abdomen  Patient does report that she thinks she threw up a couple times yesterday had a little bit of diarrhea, maybe   Attempted to reach sister, advises voicemail box not set up.  Unable to leave message with Latoy Labriola Sister Emergency Contact [phone]    Physical Exam   Triage Vital Signs: ED Triage Vitals [07/27/24 0711]  Encounter Vitals Group     BP      Girls Systolic BP Percentile      Girls Diastolic BP Percentile      Boys Systolic BP Percentile      Boys Diastolic BP Percentile      Pulse      Resp      Temp 97.9 F (36.6 C)     Temp Source Oral     SpO2      Weight 135 lb 12.9 oz (61.6 kg)     Height 5' 5 (1.651 m)     Head Circumference       Peak Flow      Pain Score 0     Pain Loc      Pain Education      Exclude from Growth Chart     Most recent vital signs: Vitals:   07/27/24 1300 07/27/24 1315  BP: (!) 148/68   Pulse: 63 65  Resp: 18 19  Temp:    SpO2: 100% 100%     General: Awake, no distress.  Mood is just slightly depressed.  CV:  Good peripheral perfusion.  Normal tones Resp:  Normal effort.  Clear bilateral speaks with easy clear breaths.  No distress Abd:  No distention.  Reports pain mild primarily seated in the left mid to left lower quadrant.  No rebound or guarding.  No active emesis Other:   She moves all extremities with equal strength.  There is no prater drift or deficits.  Extraocular moods are normal.  She is able to maintain eye contact.  Extraocular movements normal.  Speech is clear no slurring no facial droop.  She does have disorientation but otherwise no focal abnormalities to noted  ED Results / Procedures / Treatments   Labs (all labs ordered are listed, but only  abnormal results are displayed) Labs Reviewed  COMPREHENSIVE METABOLIC PANEL WITH GFR - Abnormal; Notable for the following components:      Result Value   Glucose, Bld 128 (*)    BUN 27 (*)    Creatinine, Ser 1.04 (*)    All other components within normal limits  URINALYSIS, ROUTINE W REFLEX MICROSCOPIC - Abnormal; Notable for the following components:   Color, Urine YELLOW (*)    APPearance CLEAR (*)    Glucose, UA >=500 (*)    Leukocytes,Ua TRACE (*)    Bacteria, UA RARE (*)    All other components within normal limits  LITHIUM  LEVEL - Abnormal; Notable for the following components:   Lithium  Lvl <0.10 (*)    All other components within normal limits  TSH - Abnormal; Notable for the following components:   TSH 5.716 (*)    All other components within normal limits  BLOOD GAS, VENOUS - Abnormal; Notable for the following components:   Bicarbonate 31.5 (*)    Acid-Base Excess 4.3 (*)    All other components within  normal limits  RESP PANEL BY RT-PCR (RSV, FLU A&B, COVID)  [id number]  CULTURE, BLOOD (ROUTINE X 2)  CULTURE, BLOOD (ROUTINE X 2)  CBC WITH DIFFERENTIAL/PLATELET  LACTIC ACID, PLASMA  PROCALCITONIN  VITAMIN B12  CBG MONITORING, ED  CBG MONITORING, ED  TROPONIN I (HIGH SENSITIVITY)     EKG  ED ECG REPORT I, Oneil Budge, the attending physician, personally viewed and interpreted this ECG.  Date: 07/27/2024 EKG Time: 720 Rate: 65 Rhythm: normal sinus rhythm QRS Axis: normal Intervals: normal ST/T Wave abnormalities: normal Narrative Interpretation: no evidence of acute ischemia    RADIOLOGY  CT ABDOMEN PELVIS W CONTRAST Result Date: 07/27/2024 EXAM: CT ABDOMEN AND PELVIS WITH CONTRAST 07/27/2024 09:41:06 AM TECHNIQUE: CT of the abdomen and pelvis was performed with the administration of 100 mL of iohexol  (OMNIPAQUE ) 300 MG/ML solution. Multiplanar reformatted images are provided for review. Automated exposure control, iterative reconstruction, and/or weight-based adjustment of the mA/kV was utilized to reduce the radiation dose to as low as reasonably achievable. COMPARISON: CT 09/06/2023. CT abdomen and pelvis 09/07/2017. CLINICAL HISTORY: 63 year old female with LLQ abdominal pain. FINDINGS: LOWER CHEST: Mild motion artifact at the lung bases which appears stable and negative. LIVER: Stable liver since 2018. GALLBLADDER AND BILE DUCTS: Chronic cholecystectomy. No biliary ductal dilatation. SPLEEN: Stable spleen since 2018. PANCREAS: No acute abnormality. ADRENAL GLANDS: Chronic adrenal gland thickening also appears stable since 2018 and benign. KIDNEYS, URETERS AND BLADDER: No stones in the kidneys or ureters. No hydronephrosis. No perinephric or periureteral stranding. Mildly to moderately distended but otherwise unremarkable urinary bladder. GI AND BOWEL: Abundant large bowel retained stool and generalized large bowel redundancy. The cecum is on lax mesentery. Appendix is diminutive or  absent. No dilated small bowel. Chronic postoperative changes to the stomach. Chronic possible vascular stent in the distal stomach and proximal duodenum which appears unchanged from last year and from a CT abdomen and pelvis 09/07/2017. PERITONEUM AND RETROPERITONEUM: No ascites. No free air. No pelvis free fluid. VASCULATURE: Advanced calcified atherosclerosis of the aorta and iliac arteries. Negative for abdominal aortic aneurysm. Major arterial structures remain patent despite atherosclerosis. Portal venous system appears to be patent. LYMPH NODES: No lymphadenopathy. REPRODUCTIVE ORGANS: No acute abnormality. BONES AND SOFT TISSUES: Chronic lumbar disc degeneration with vacuum disc. Chronic ventral abdominal postoperative changes are stable with no adverse features. No acute osseous abnormality. IMPRESSION: 1. Large bowel redundancy  with abundant retained stool. Chronic gastric bypass appears stable. Urinary bladder distention. Advanced aortoiliac atherosclerosis. 2. No inflammatory process identified in the abdomen or pelvis. Electronically signed by: Helayne Hurst MD 07/27/2024 09:58 AM EDT RP Workstation: [id number]   CT Head Wo Contrast Result Date: 07/27/2024 EXAM: CT HEAD WITHOUT CONTRAST 07/27/2024 09:41:06 AM TECHNIQUE: CT of the head was performed without the administration of intravenous contrast. Automated exposure control, iterative reconstruction, and/or weight based adjustment of the mA/kV was utilized to reduce the radiation dose to as low as reasonably achievable. COMPARISON: None available. CLINICAL HISTORY: Delirium. Altered Mental Status. FINDINGS: BRAIN AND VENTRICLES: No acute hemorrhage. No evidence of acute infarct. No hydrocephalus. No extra-axial collection. No mass effect or midline shift. Atherosclerotic calcification of internal carotid arteries at skull base. ORBITS: No acute abnormality. SINUSES: No acute abnormality. SOFT TISSUES AND SKULL: No acute soft tissue abnormality. No  skull fracture. IMPRESSION: 1. No acute intracranial abnormality. Electronically signed by: Helayne Hurst MD 07/27/2024 09:51 AM EDT RP Workstation: [id number]   DG Chest Portable 1 View Result Date: 07/27/2024 CLINICAL DATA:  Altered mental status EXAM: PORTABLE CHEST - 1 VIEW COMPARISON:  07/21/2024 FINDINGS: Unchanged mild cardiomegaly. No pulmonary vascular congestion. Patchy opacities in the RIGHT mid lung again seen likely relates to pneumonitis. IMPRESSION: Unchanged patchy opacities of the RIGHT mid lung, most consistent with pneumonia. Electronically Signed   By: Aliene Lloyd M.D.   On: 07/27/2024 08:11   CT head negative for acute  Chest x-ray interpreted by me as right lower lobe infiltrate.  Pattern seems fairly similar to previous x-ray  In the context of afebrile status normal white count low calcitonin consideration for aspiration event after vomiting yesterday versus unresolved radiographic findings after recent pneumonia are considered.  Recent CT she did not have any known lung mass at that point   PROCEDURES:  Critical Care performed: No  Procedures   MEDICATIONS ORDERED IN ED: Medications  carvedilol  (COREG ) tablet 3.125 mg (has no administration in time range)  pravastatin  (PRAVACHOL ) tablet 40 mg (has no administration in time range)  busPIRone  (BUSPAR ) tablet 30 mg (has no administration in time range)  lithium  carbonate capsule 150 mg (has no administration in time range)  risperiDONE  (RISPERDAL ) tablet 0.5 mg (has no administration in time range)  valbenazine (INGREZZA) capsule 80 mg (has no administration in time range)  Finerenone  TABS 20 mg (has no administration in time range)  levothyroxine  (SYNTHROID ) tablet 112 mcg (has no administration in time range)  metFORMIN (GLUCOPHAGE) tablet 500 mg (has no administration in time range)  famotidine  (PEPCID ) tablet 40 mg (has no administration in time range)  ondansetron  (ZOFRAN ) tablet 4 mg (has no administration  in time range)  polyethylene glycol (MIRALAX  / GLYCOLAX ) packet 17 g (has no administration in time range)  lamoTRIgine  (LAMICTAL ) tablet 150 mg (has no administration in time range)  levETIRAcetam  (KEPPRA ) tablet 250 mg (has no administration in time range)  albuterol  (PROVENTIL ) (2.5 MG/3ML) 0.083% nebulizer solution 2.5 mg (has no administration in time range)  loratadine  (CLARITIN ) tablet 10 mg (has no administration in time range)  fluticasone  (FLONASE ) 50 MCG/ACT nasal spray 1 spray (has no administration in time range)  budesonide -glycopyrrolate-formoterol  (BREZTRI) 160-9-4.8 MCG/ACT inhaler 2 puff (has no administration in time range)  enoxaparin  (LOVENOX ) injection 40 mg (has no administration in time range)  butalbital-acetaminophen -caffeine (FIORICET) 50-325-40 MG per tablet 1 tablet (has no administration in time range)  insulin  aspart (novoLOG ) injection 0-9 Units (has no administration in time range)  lactulose (CHRONULAC) 10 GM/15ML solution 30 g (has no administration in time range)  sodium chloride  0.9 % bolus 500 mL (0 mLs Intravenous Stopped 07/27/24 1032)  levETIRAcetam  (KEPPRA ) undiluted injection 250 mg (250 mg Intravenous Given 07/27/24 0802)  ondansetron  (ZOFRAN ) injection 4 mg (4 mg Intravenous Given 07/27/24 0801)  iohexol  (OMNIPAQUE ) 300 MG/ML solution 100 mL (100 mLs Intravenous Contrast Given 07/27/24 0901)     IMPRESSION / MDM / ASSESSMENT AND PLAN / ED COURSE  I reviewed the triage vital signs and the nursing notes.                              Differential diagnosis includes, but is not limited to, broad spectrum of disease like potentially delta mental status.  It does seem to me that she has slight somnolence perhaps mild nonspecific altered mental status or slight delirium.  She is awake alert but not well-oriented but does have a documented history of dementia as well.  She has no focal neurologic deficits on examination.  She is attentive and maintains  alertness but does seem a bit fatigued.  Her only complaint at this time left lower quadrant pain and nausea.  She does report vomiting yesterday and loose stool yesterday.  Will proceed with broad workup.  Also evaluate in the context of history of seizure disorder, recent pneumonia, medication etc.    Patient's presentation is most consistent with acute complicated illness / injury requiring diagnostic workup.   ----------------------------------------- 11:28 AM on 07/27/2024 ----------------------------------------- Discussed with Dr. Laurita of hospitalist service.  Will see and evaluate in consultation for possible observation or further recommendations from the hospitalist service.  I have not yet find a definitive cause for the patient's reported somnolence and change in mental status.  Have not been able to make contact with patient's family.  She remains in no acute distress but was recently hospitalized with pneumonia and COPD.  Await consultation from hospitalist   Patient has been admitted to hospitalist service      FINAL CLINICAL IMPRESSION(S) / ED DIAGNOSES   Final diagnoses:  Delirium  General weakness     Rx / DC Orders   ED Discharge Orders     None        Note:  This document was prepared using Dragon voice recognition software and may include unintentional dictation errors.   Dicky Anes, MD 07/27/24 1341

## 2024-07-27 NOTE — ED Notes (Addendum)
 RN called lab to ask if blood cultures had been collected. Lab reports that phlebotomist was not able to obtain.

## 2024-07-27 NOTE — Progress Notes (Signed)
 Mobility Specialist - Progress Note    07/27/24 1600  Mobility  Activity Ambulated with assistance;Stood at bedside  Level of Assistance Standby assist, set-up cues, supervision of patient - no hands on  Assistive Device None  Distance Ambulated (ft) 15 ft  Range of Motion/Exercises Active  Activity Response Tolerated well  Mobility Referral Yes  Mobility visit 1 Mobility  Mobility Specialist Start Time (ACUTE ONLY) 1605  Mobility Specialist Stop Time (ACUTE ONLY) 1618  Mobility Specialist Time Calculation (min) (ACUTE ONLY) 13 min   Pt resting bed on 2L upon entry. Pt STS and ambulates to bathroom SBA with no AD. Pt returned to bed and left with needs in reach. Bed alarm activated.   Guido Rumble Mobility Specialist 07/27/24, 4:59 PM

## 2024-07-27 NOTE — ED Notes (Signed)
 RN called lab about blood cultures at this time. Lab states that they are sending phlebotomy.

## 2024-07-27 NOTE — Plan of Care (Signed)

## 2024-07-28 DIAGNOSIS — R4182 Altered mental status, unspecified: Secondary | ICD-10-CM | POA: Diagnosis not present

## 2024-07-28 DIAGNOSIS — R410 Disorientation, unspecified: Secondary | ICD-10-CM | POA: Diagnosis not present

## 2024-07-28 LAB — BLOOD CULTURE ID PANEL (REFLEXED) - BCID2

## 2024-07-28 LAB — GLUCOSE, CAPILLARY
Glucose-Capillary: 211 mg/dL — ABNORMAL HIGH (ref 70–99)
Glucose-Capillary: 76 mg/dL (ref 70–99)
Glucose-Capillary: 120 mg/dL — ABNORMAL HIGH (ref 70–99)
Glucose-Capillary: 154 mg/dL — ABNORMAL HIGH (ref 70–99)

## 2024-07-28 MED ORDER — POLYETHYLENE GLYCOL 3350 17 G PO PACK
34.0000 g | PACK | ORAL | Status: AC
  Administered 2024-07-28 (×4): 34 g via ORAL
  Filled 2024-07-28 (×4): qty 2

## 2024-07-28 NOTE — Plan of Care (Signed)
  Problem: Education: Goal: Ability to describe self-care measures that may prevent or decrease complications (Diabetes Survival Skills Education) will improve Outcome: Progressing Goal: Individualized Educational Video(s) Outcome: Progressing   Problem: Coping: Goal: Ability to adjust to condition or change in health will improve Outcome: Progressing   Problem: Fluid Volume: Goal: Ability to maintain a balanced intake and output will improve Outcome: Progressing   Problem: Nutritional: Goal: Maintenance of adequate nutrition will improve Outcome: Progressing Goal: Progress toward achieving an optimal weight will improve Outcome: Progressing   Problem: Skin Integrity: Goal: Risk for impaired skin integrity will decrease Outcome: Progressing   Problem: Clinical Measurements: Goal: Ability to maintain clinical measurements within normal limits will improve Outcome: Progressing Goal: Will remain free from infection Outcome: Progressing Goal: Diagnostic test results will improve Outcome: Progressing Goal: Respiratory complications will improve Outcome: Progressing Goal: Cardiovascular complication will be avoided Outcome: Progressing   Problem: Elimination: Goal: Will not experience complications related to bowel motility Outcome: Progressing Goal: Will not experience complications related to urinary retention Outcome: Progressing

## 2024-07-28 NOTE — Care Management Obs Status (Signed)
 MEDICARE OBSERVATION STATUS NOTIFICATION   Patient Details  Name: Tammy [name] MRN: [id number] Date of Birth: Jul 08, 1961   Medicare Observation Status Notification Given:  Yes    Hardy Harcum W, CMA 07/28/2024, 11:36 AM

## 2024-07-28 NOTE — TOC Initial Note (Addendum)
 Transition of Care [facility]) - Initial/Assessment Note    Patient Details  Name: Tammy [name] MRN: [id number] Date of Birth: November 02, 1960  [facility] CM/SW Contact:    Daved JONETTA Hamilton, RN Phone Number: 07/28/2024, 2:58 PM  Clinical Narrative:                  Patient is a resident of [facility]. Spoke with contact [name] [phone] at [facility] and confirmed patient may return there once medically ready for discharge, he will arrange transport from hospital to [facility]. Marinda verbally expressed concerns regarding patients urinary frequency and verbalized hope that patient will be fully back to baseline at the time of discharge.   Will continue to follow for discharge planning.  Expected Discharge Plan: Group Home Barriers to Discharge: Continued Medical Work up   Patient Goals and CMS Choice            Expected Discharge Plan and Services   Discharge Planning Services: Other - See comment (Patient will return back to [facility], TOC will assist in facilitating this.)   Living arrangements for the past 2 months: Group Home                                      Prior Living Arrangements/Services Living arrangements for the past 2 months: Group Home Lives with:: Other (Comment) (patient resides at [facility], an adult family care facility) Patient language and need for interpreter reviewed:: Yes          Care giver support system in place?: Yes (comment)   Criminal Activity/Legal Involvement Pertinent to Current Situation/Hospitalization: No - Comment as needed  Activities of Daily Living   ADL Screening (condition at time of admission) Independently performs ADLs?: No Does the patient have a NEW difficulty with bathing/dressing/toileting/self-feeding that is expected to last >3 days?: No Does the patient have a NEW difficulty with getting in/out of bed, walking, or climbing stairs that is expected to last >3 days?:  No Does the patient have a NEW difficulty with communication that is expected to last >3 days?: No Is the patient deaf or have difficulty hearing?: No Does the patient have difficulty seeing, even when wearing glasses/contacts?: No Does the patient have difficulty concentrating, remembering, or making decisions?: Yes  Permission Sought/Granted                  Emotional Assessment Appearance:: Appears stated age     Orientation: : Oriented to Self   Psych Involvement: No (comment)  Admission diagnosis:  Delirium [R41.0] General weakness [R53.1] AMS (altered mental status) [R41.82] Patient Active Problem List   Diagnosis Date Noted   Acute metabolic encephalopathy 07/27/2024   AMS (altered mental status) 07/27/2024   Pneumonia 07/22/2024   CAP (community acquired pneumonia) 07/21/2024   PNA (pneumonia) 07/21/2024   Anxiety 12/11/2023   GERD without esophagitis 12/11/2023   Type 2 diabetes mellitus with chronic kidney disease, with long-term current use of insulin  (HCC) 12/11/2023   COPD exacerbation (HCC) 12/10/2023   Diabetes mellitus type II, non insulin  dependent (HCC) 08/15/2023   Essential hypertension 08/15/2023   Dyslipidemia 08/15/2023   Tobacco dependence due to cigarettes 08/15/2023   Chronic diastolic CHF (congestive heart failure) (HCC) 08/14/2023   Hypothyroidism 08/14/2023   Bipolar mood disorder (HCC) 08/14/2023   Seizure-like activity (HCC) 08/03/2019   B12 deficiency 06/23/2019  Iron deficiency 06/21/2019   History of seizure 04/07/2019   Seizure disorder (HCC) 03/22/2019   Normocytic anemia 03/18/2019   Influenza A 11/10/2018   Hyponatremia 10/26/2018   Syncopal episodes 06/23/2017   Acute on chronic respiratory failure with hypoxia (HCC) 03/15/2017   COPD with acute exacerbation (HCC) 03/15/2017   Acute on chronic respiratory failure (HCC) 03/15/2017   PCP:  Donal Channing SQUIBB, FNP Pharmacy:   [facility] [place], [place] -  [address] [place] [address] Phone: [phone] Fax: [phone]  [facility] [phone] - [place], [place] - [address] [place] [address] Phone: [phone] Fax: [phone]  [facility] [address] [place] [address] Phone: [phone] Fax: [phone]     Social Drivers of Health (SDOH) Social History: SDOH Screenings   Food Insecurity: No Food Insecurity (07/27/2024)  Housing: Low Risk  (07/27/2024)  Transportation Needs: No Transportation Needs (07/27/2024)  Utilities: Not At Risk (07/27/2024)  Financial Resource Strain: Low Risk  (10/29/2018)  Physical Activity: Unknown (10/29/2018)  Social Connections: Unknown (12/11/2023)  Stress: No Stress Concern Present (10/29/2018)  Tobacco Use: Medium Risk (07/27/2024)   SDOH Interventions:     Readmission Risk Interventions     No data to display

## 2024-07-28 NOTE — Progress Notes (Addendum)
 PHARMACY - PHYSICIAN COMMUNICATION CRITICAL VALUE ALERT - BLOOD CULTURE IDENTIFICATION (BCID)  Tammy [name] is an 63 y.o. female who presented to [facility] on 07/27/2024 with a chief complaint of AMS  Assessment: Lab called with [facility] seen on gram stain from anaerobic bottle (1/4 bottles) from 10/21 blood cx. BCID did not detect any species. Patient is not currently on any antibiotics, afebrile, with no leukocytosis. Could likely be a contaminant. Reached out to provider about continuing to monitor off antibiotics. Provider agreed.   Name of physician (or Provider) Contacted: Dr. Awanda  Current antibiotics: None  Changes to prescribed antibiotics recommended:  No changes needed. Not on any antibiotics right now. Will continue to monitor off antibiotics.   Results for orders placed or performed during the hospital encounter of 07/27/24  Blood Culture ID Panel (Reflexed) (Collected: 07/27/2024  1:14 PM)  Result Value Ref Range   Enterococcus faecalis NOT DETECTED NOT DETECTED   Enterococcus Faecium NOT DETECTED NOT DETECTED   Listeria monocytogenes NOT DETECTED NOT DETECTED   Staphylococcus species NOT DETECTED NOT DETECTED   Staphylococcus aureus (BCID) NOT DETECTED NOT DETECTED   Staphylococcus epidermidis NOT DETECTED NOT DETECTED   Staphylococcus lugdunensis NOT DETECTED NOT DETECTED   Streptococcus species NOT DETECTED NOT DETECTED   Streptococcus agalactiae NOT DETECTED NOT DETECTED   Streptococcus pneumoniae NOT DETECTED NOT DETECTED   Streptococcus pyogenes NOT DETECTED NOT DETECTED   A.calcoaceticus-baumannii NOT DETECTED NOT DETECTED   Bacteroides fragilis NOT DETECTED NOT DETECTED   Enterobacterales NOT DETECTED NOT DETECTED   Enterobacter cloacae complex NOT DETECTED NOT DETECTED   Escherichia coli NOT DETECTED NOT DETECTED   Klebsiella aerogenes NOT DETECTED NOT DETECTED   Klebsiella oxytoca NOT DETECTED NOT DETECTED   Klebsiella pneumoniae NOT DETECTED NOT DETECTED    Proteus species NOT DETECTED NOT DETECTED   Salmonella species NOT DETECTED NOT DETECTED   Serratia marcescens NOT DETECTED NOT DETECTED   Haemophilus influenzae NOT DETECTED NOT DETECTED   Neisseria meningitidis NOT DETECTED NOT DETECTED   Pseudomonas aeruginosa NOT DETECTED NOT DETECTED   Stenotrophomonas maltophilia NOT DETECTED NOT DETECTED   Candida albicans NOT DETECTED NOT DETECTED   Candida auris NOT DETECTED NOT DETECTED   Candida glabrata NOT DETECTED NOT DETECTED   Candida krusei NOT DETECTED NOT DETECTED   Candida parapsilosis NOT DETECTED NOT DETECTED   Candida tropicalis NOT DETECTED NOT DETECTED   Cryptococcus neoformans/gattii NOT DETECTED NOT DETECTED    Ransom Blanch PGY-1 Pharmacy Resident  [place] [facility]  07/28/2024 10:35 AM

## 2024-07-28 NOTE — Progress Notes (Signed)
  PROGRESS NOTE    Tammy [name]  FMW:[id number] DOB: Sep 21, 1961 DOA: 07/27/2024 PCP: Tammy Channing SQUIBB, FNP  108A/108A-AA  LOS: 0 days   Brief hospital course:   Assessment & Plan: Tammy [name] is a 63 y.o. female with medical history significant of COPD with chronic hypoxic respiratory failure on 2 L continuously, IIDM, chronic HFpEF, CKD stage II, seizure disorder, anxiety/depression, dementia, was found confused this morning.   Patient was found by facility staff to be confused and sent to ED.  Patient denied any pain, no shortness of breath no dysuria.  She was recently hospitalized for COPD exacerbation and pneumonia and discharged on Levaquin .   AMS Acute metabolic encephalopathy Baseline dementia - Etiology unknown.  Differential including drug drug interaction of Levaquin  and seizure medications, and possibly nitrofurantoin , breakthrough seizure, hypoglycemia. [place] Sous through patient's medication list, patient was discharged on Levaquin  which can potentially lower seizure threshold.  Will check EEG.  Discontinue Levaquin . - Patient takes Jardiance  and metformin for diabetes, most recent A1c 5.9, borderline low for diabetic control.  Will hold off Jardiance , the patient on metformin therapy. - Also noticed patient has been taking Macrobid  which is contraindicated in her kidney function status.  Will discontinue nitrofurantoin .   History of COPD with chronic hypoxic respiratory failure On baseline 2 L oxygen --stable --cont bronchodilators   Abdominal pain 2/2 Constipation --lactulose twice daily x 2 days, no BM yet. --aggressive miralax    Seizure disorder --cont Keppra  and Lamictal    History of dysphagia --cont dys 1 diet   IIDM --A1c 5.9 --d/c BG checks   CKD stage II - As above, discontinue Macrobid    Bipolar 1 --cont Buspar , Lithium , risperidone  and Ingrezza   DVT prophylaxis: Lovenox  SQ Code Status: Full code  Family Communication:  Level of  care: Telemetry Medical Dispo:   The patient is from: group home Anticipated d/c is to: group home Anticipated d/c date is: tomorrow   Subjective and Interval History:  Pt said she had been voiding, but no BM yet.   Objective: Vitals:   07/27/24 2359 07/28/24 0418 07/28/24 0815 07/28/24 1550  BP: 99/65 (!) 119/58 125/75 117/73  Pulse: (!) 53 60 61 71  Resp: 18 18 16 16   Temp: 97.8 F (36.6 C) 98.7 F (37.1 C) 99.1 F (37.3 C) 98.1 F (36.7 C)  TempSrc: Oral Oral Oral   SpO2: 99% 99% 100% 99%  Weight:      Height:        Intake/Output Summary (Last 24 hours) at 07/28/2024 2226 Last data filed at 07/28/2024 1900 Gross per 24 hour  Intake 640 ml  Output --  Net 640 ml   Filed Weights   07/27/24 0711  Weight: 61.6 kg    Examination:   Constitutional: NAD, alert HEENT: conjunctivae and lids normal, EOMI CV: No cyanosis.   RESP: normal respiratory effort, on RA Neuro: II - XII grossly intact.     Data Reviewed: I have personally reviewed labs and imaging studies  Time spent: 50 minutes  Ellouise Haber, MD [facility] If 7PM-7AM, please contact night-coverage 07/28/2024, 10:26 PM

## 2024-07-29 DIAGNOSIS — R410 Disorientation, unspecified: Secondary | ICD-10-CM | POA: Diagnosis not present

## 2024-07-29 DIAGNOSIS — R4182 Altered mental status, unspecified: Secondary | ICD-10-CM | POA: Diagnosis not present

## 2024-07-29 LAB — GLUCOSE, CAPILLARY: Glucose-Capillary: 118 mg/dL — ABNORMAL HIGH (ref 70–99)

## 2024-07-29 MED ORDER — ALUMINUM-MAGNESIUM-SIMETHICONE 200-200-20 MG/5ML PO SUSP: 30.0000 mL | Freq: Four times a day (QID) | ORAL | Status: AC | PRN

## 2024-07-29 MED ORDER — POLYETHYLENE GLYCOL 3350 17 G PO PACK: 17.0000 g | PACK | Freq: Every day | ORAL | Status: AC

## 2024-07-29 MED ORDER — SMOG ENEMA
960.0000 mL | Freq: Once | RECTAL | Status: DC
Start: 2024-07-29 — End: 2024-07-29
  Filled 2024-07-29: qty 960

## 2024-07-29 NOTE — Progress Notes (Signed)
 Mobility Specialist - Progress Note    07/29/24 1235  Mobility  Activity Ambulated with assistance;Stood at bedside  Level of Assistance Contact guard assist, steadying assist  Assistive Device None  Distance Ambulated (ft) 15 ft  Range of Motion/Exercises Active  Activity Response Tolerated well  Mobility Referral Yes  Mobility visit 1 Mobility  Mobility Specialist Start Time (ACUTE ONLY) 1208  Mobility Specialist Stop Time (ACUTE ONLY) 1222  Mobility Specialist Time Calculation (min) (ACUTE ONLY) 14 min   Pt resting in bed upon entry on 2L. Pt STS and ambulates to bathroom CGA with no AD HHA. Pt returned to bed after hygiene clean. Pt very confused but pleasant. Pt left in bed with needs in reach and bed alarm activated.   Guido Rumble Mobility Specialist 07/29/24, 1:35 PM

## 2024-07-29 NOTE — NC FL2 (Signed)
 [place]  MEDICAID [id number] LEVEL OF CARE FORM     IDENTIFICATION  Patient Name: Tammy [name] [facility] Birthdate: [birth year] Sex: female Admission Date (Current Location): 07/27/2024  [facility] and [place] Number:  Chiropodist and Address:  [facility], [address] [place], [place] [address]      Provider Number: [id number]  Attending Physician Name and Address:  Awanda City, MD  Relative Name and Phone Number:  [name]- [phone]    Current Level of Care: Hospital Recommended Level of Care: [facility] Prior Approval Number:    Date Approved/Denied:   PASRR Number:    Discharge Plan:      Current Diagnoses: Patient Active Problem List   Diagnosis Date Noted   Acute metabolic encephalopathy 07/27/2024   AMS (altered mental status) 07/27/2024   Pneumonia 07/22/2024   CAP (community acquired pneumonia) 07/21/2024   PNA (pneumonia) 07/21/2024   Anxiety 12/11/2023   GERD without esophagitis 12/11/2023   Type 2 diabetes mellitus with chronic kidney disease, with long-term current use of insulin  (HCC) 12/11/2023   COPD exacerbation (HCC) 12/10/2023   Diabetes mellitus type II, non insulin  dependent (HCC) 08/15/2023   Essential hypertension 08/15/2023   Dyslipidemia 08/15/2023   Tobacco dependence due to cigarettes 08/15/2023   Chronic diastolic CHF (congestive heart failure) (HCC) 08/14/2023   Hypothyroidism 08/14/2023   Bipolar mood disorder (HCC) 08/14/2023   Seizure-like activity (HCC) 08/03/2019   B12 deficiency 06/23/2019   Iron deficiency 06/21/2019   History of seizure 04/07/2019   Seizure disorder (HCC) 03/22/2019   Normocytic anemia 03/18/2019   Influenza A 11/10/2018   Hyponatremia 10/26/2018   Syncopal episodes 06/23/2017   Acute on chronic respiratory failure with hypoxia (HCC) 03/15/2017   COPD with acute exacerbation (HCC) 03/15/2017   Acute on chronic respiratory failure (HCC) 03/15/2017    Orientation  RESPIRATION BLADDER Height & Weight     Self, Place  Normal Continent Weight: 61.6 kg Height:  5' 5 (165.1 cm)  BEHAVIORAL SYMPTOMS/MOOD NEUROLOGICAL BOWEL NUTRITION STATUS      Continent Diet  AMBULATORY STATUS COMMUNICATION OF NEEDS Skin   Independent Verbally Normal                       Personal Care Assistance Level of Assistance  Bathing, Feeding, Dressing Bathing Assistance: Independent Feeding assistance: Independent Dressing Assistance: Independent     Functional Limitations Info  Hearing          SPECIAL CARE FACTORS FREQUENCY                       Contractures      Additional Factors Info  Code Status, Allergies Code Status Info: FULL Allergies Info: Penicillins & Prednisone            Current Medications (07/29/2024):  This is the current hospital active medication list Current Facility-Administered Medications  Medication Dose Route Frequency Provider Last Rate Last Admin   albuterol  (PROVENTIL ) (2.5 MG/3ML) 0.083% nebulizer solution 2.5 mg  2.5 mg Nebulization Q6H PRN Laurita Manor T, MD       budesonide -glycopyrrolate-formoterol  (BREZTRI) 160-9-4.8 MCG/ACT inhaler 2 puff  2 puff Inhalation BID Laurita Manor T, MD   2 puff at 07/29/24 0800   busPIRone  (BUSPAR ) tablet 30 mg  30 mg Oral BID Laurita Manor T, MD   30 mg at 07/29/24 9070   butalbital-acetaminophen -caffeine (FIORICET) 50-325-40 MG per tablet 1 tablet  1 tablet Oral Q6H PRN  Laurita Cort DASEN, MD   1 tablet at 07/28/24 9380   carvedilol  (COREG ) tablet 3.125 mg  3.125 mg Oral BID WC Laurita Cort T, MD   3.125 mg at 07/29/24 9070   enoxaparin  (LOVENOX ) injection 40 mg  40 mg Subcutaneous Q24H Laurita Cort T, MD   40 mg at 07/28/24 2225   famotidine  (PEPCID ) tablet 40 mg  40 mg Oral Daily Laurita Cort T, MD   40 mg at 07/29/24 9071   Finerenone  TABS 20 mg  1 tablet Oral Daily Laurita Cort T, MD       fluticasone  (FLONASE ) 50 MCG/ACT nasal spray 1 spray  1 spray Each Nare Daily Laurita Cort T, MD   1  spray at 07/29/24 0930   lamoTRIgine  (LAMICTAL ) tablet 150 mg  150 mg Oral BID Laurita Cort T, MD   150 mg at 07/29/24 0931   levETIRAcetam  (KEPPRA ) tablet 250 mg  250 mg Oral BID Laurita Cort T, MD   250 mg at 07/29/24 9068   levothyroxine  (SYNTHROID ) tablet 112 mcg  112 mcg Oral QAC breakfast Laurita Cort T, MD   112 mcg at 07/29/24 9366   lithium  carbonate capsule 150 mg  150 mg Oral Daily Laurita Cort T, MD   150 mg at 07/28/24 2224   loratadine  (CLARITIN ) tablet 10 mg  10 mg Oral Daily Laurita Cort T, MD   10 mg at 07/29/24 9070   metFORMIN (GLUCOPHAGE) tablet 500 mg  500 mg Oral BID WC Zhang, Ping T, MD   500 mg at 07/29/24 9070   ondansetron  (ZOFRAN ) tablet 4 mg  4 mg Oral Q8H PRN Laurita Cort T, MD       polyethylene glycol (MIRALAX  / GLYCOLAX ) packet 17 g  17 g Oral Daily PRN Laurita Cort T, MD   17 g at 07/28/24 2226   pravastatin  (PRAVACHOL ) tablet 40 mg  40 mg Oral QPM Laurita Cort T, MD   40 mg at 07/28/24 1724   risperiDONE  (RISPERDAL ) tablet 0.5 mg  0.5 mg Oral BID Laurita Cort T, MD   0.5 mg at 07/29/24 9071   valbenazine (INGREZZA) capsule 80 mg  80 mg Oral Daily Zhang, Ping T, MD   80 mg at 07/29/24 9068     Discharge Medications: Please see discharge summary for a list of discharge medications.  Relevant Imaging Results:  Relevant Lab Results:   Additional Information [name]- [phone]  Dalia GORMAN Fuse, RN

## 2024-07-29 NOTE — Plan of Care (Signed)
  Problem: Coping: Goal: Ability to adjust to condition or change in health will improve Outcome: Progressing   Problem: Clinical Measurements: Goal: Ability to maintain clinical measurements within normal limits will improve Outcome: Progressing   Problem: Elimination: Goal: Will not experience complications related to bowel motility Outcome: Progressing   Problem: Activity: Goal: Risk for activity intolerance will decrease Outcome: Progressing   Problem: Safety: Goal: Ability to remain free from injury will improve Outcome: Progressing   Problem: Pain Managment: Goal: General experience of comfort will improve and/or be controlled Outcome: Progressing

## 2024-07-29 NOTE — TOC Transition Note (Signed)
 Transition of Care [facility]) - Discharge Note   Patient Details  Name: Tammy [name] MRN: [id number] Date of Birth: [birth year]  [facility] CM/SW Contact:  Dalia GORMAN Fuse, RN Phone Number: 07/29/2024, 12:29 PM   Clinical Narrative:    The patient is medically ready to discharge back to [facility]. TOC spoke with Marinda [phone] and he will transport the patient to the facility. Marinda requested a Designer, jewellery, TOC spoke with rep from [place] and it is likely not covered by the patient's insurance. Per Marinda, they are willing to pay for the concentrator out of pocket. TOC encouraged Marinda to work with the patient's PCP. FL2 and DC Summary faxed to [facility] [phone].   Final next level of care: Group Home Barriers to Discharge: Barriers Resolved   Patient Goals and CMS Choice            Discharge Placement                  Name of family member notified: Attempted to contact Rayne Cowdrey (sister) [phone], number did not ring, message played that the voicemail has not been set up    Discharge Plan and Services Additional resources added to the After Visit Summary for     Discharge Planning Services: Other - See comment (Patient will return back to [facility], TOC will assist in facilitating this.)                                 Social Drivers of Health (SDOH) Interventions SDOH Screenings   Food Insecurity: No Food Insecurity (07/27/2024)  Housing: Low Risk  (07/27/2024)  Transportation Needs: No Transportation Needs (07/27/2024)  Utilities: Not At Risk (07/27/2024)  Financial Resource Strain: Low Risk  (10/29/2018)  Physical Activity: Unknown (10/29/2018)  Social Connections: Unknown (12/11/2023)  Stress: No Stress Concern Present (10/29/2018)  Tobacco Use: Medium Risk (07/27/2024)     Readmission Risk Interventions     No data to display

## 2024-07-29 NOTE — Progress Notes (Signed)
 Patient wheeled to DC pick up area, car from community group home is waiting. Transported via wheelchair, attached on 2 L of oxygen.

## 2024-07-29 NOTE — Discharge Summary (Signed)
 Physician Discharge Summary   Tammy [name]  female DOB: 1961-04-09  FMW:[id number]  PCP: Donal Channing SQUIBB, FNP  Admit date: 07/27/2024 Discharge date: 07/29/2024  Admitted From: ALF Disposition:  ALF CODE STATUS: Full code   Hospital Course:  For full details, please see H&P, progress notes, consult notes and ancillary notes.  Briefly,  Tammy [name] is a 63 y.o. female with medical history significant of COPD with chronic hypoxic respiratory failure on 2 L, DM, chronic HFpEF, seizure disorder, Bipolar, dementia, was found confused the morning of presentation.   Patient was found by facility staff to be confused and sent to ED.  Patient denied shortness of breath, no dysuria.  Patient reported only thing bothering her was stomach pain mostly in her left lower abdomen.  She was recently hospitalized for COPD exacerbation and pneumonia and discharged on Levaquin .   AMS Acute metabolic encephalopathy, ruled out Baseline dementia - Pt was reportedly more confused, however, difficult to assess confusion since pt does have baseline dementia.  Pt is able to answer simple questions and makes her needs known.  History of COPD with chronic hypoxic respiratory failure On baseline 2 L oxygen --stable --cont home bronchodilators   Abdominal pain 2/2 Constipation --received lactulose twice daily x 2 days, f/b aggressive Miralax  regimen.  Pt had a big BM on 10/23. --discharged on Miralax  daily scheduled.   Seizure disorder --cont Keppra  and Lamictal    History of dysphagia --cont dys 1 diet   DM --A1c 5.9.  Not on insulin  PTA. --resume Jardiance  and metformin after discharge.   CKD stage II   Bipolar 1 --cont Buspar , Lithium , risperidone  and Ingrezza  1/4 blood cx pos for AEROCOCCUS VIRIDANS which is considered contaminant    Unless noted above, medications under STOP list are ones pt was not taking PTA.  Discharge Diagnoses:  Principal Problem:   AMS  (altered mental status) Active Problems:   Acute metabolic encephalopathy   30 Day Unplanned Readmission Risk Score    Flowsheet Row ED to Hosp-Admission (Discharged) from 07/21/2024 in [facility] 1C MEDICAL TELEMETRY  30 Day Unplanned Readmission Risk Score (%) 24.33 Filed at 07/23/2024 1600    This score is the patient's risk of an unplanned readmission within 30 days of being discharged (0 -100%). The score is based on dignosis, age, lab data, medications, orders, and past utilization.   Low:  0-14.9   Medium: 15-21.9   High: 22-29.9   Extreme: 30 and above         Discharge Instructions:  Allergies as of 07/29/2024       Reactions   Penicillins Rash   Has patient had a PCN reaction causing immediate rash, facial/tongue/throat swelling, SOB or lightheadedness with hypotension: No Has patient had a PCN reaction causing severe rash involving mucus membranes or skin necrosis: No Has patient had a PCN reaction that required hospitalization: No Has patient had a PCN reaction occurring within the last 10 years: No If all of the above answers are NO, then may proceed with Cephalosporin use.   Prednisone  Rash        Medication List     STOP taking these medications    nicotine  21 mg/24hr patch Commonly known as: NICODERM CQ  - dosed in mg/24 hours   nitrofurantoin  (macrocrystal-monohydrate) 100 MG capsule Commonly known as: Macrobid        TAKE these medications    albuterol  108 (90 Base) MCG/ACT inhaler Commonly known as: VENTOLIN  HFA Inhale 1-2  puffs into the lungs every 6 (six) hours as needed for wheezing or shortness of breath.   albuterol  (2.5 MG/3ML) 0.083% nebulizer solution Commonly known as: PROVENTIL  Take 2.5 mg by nebulization in the morning and at bedtime.   aluminum -magnesium  hydroxide-simethicone  200-200-20 MG/5ML Susp Commonly known as: MAALOX Take 30 mLs by mouth 4 (four) times daily as needed. Home med. What changed:   when to take this reasons to take this additional instructions   b complex vitamins capsule Take 1 capsule by mouth daily. Every other day   busPIRone  30 MG tablet Commonly known as: BUSPAR  Take 30 mg by mouth 2 (two) times daily.   carvedilol  3.125 MG tablet Commonly known as: COREG  Take 3.125 mg by mouth 2 (two) times daily with a meal.   cetirizine 10 MG tablet Commonly known as: ZYRTEC Take 10 mg by mouth daily.   cholecalciferol 25 MCG (1000 UNIT) tablet Commonly known as: VITAMIN D3 Take 5,000 Units by mouth daily.   Cyanocobalamin  1000 MCG Subl Place 1 tablet (1,000 mcg total) under the tongue daily.   empagliflozin  25 MG Tabs tablet Commonly known as: JARDIANCE  Take 25 mg by mouth daily.   famotidine  40 MG tablet Commonly known as: PEPCID  Take 40 mg by mouth daily.   ferrous sulfate  325 (65 FE) MG EC tablet Take 1 tablet (325 mg total) by mouth daily with breakfast.   Fish Oil 1000 MG Caps Take 1,000 mg by mouth 2 (two) times daily.   fluticasone  50 MCG/ACT nasal spray Commonly known as: FLONASE  Place 1 spray into both nostrils daily.   Kerendia  20 MG Tabs Generic drug: Finerenone  Take 1 tablet by mouth daily.   lamoTRIgine  150 MG tablet Commonly known as: LAMICTAL  Take 150 mg by mouth 2 (two) times daily.   levETIRAcetam  250 MG tablet Commonly known as: KEPPRA  Take 250 mg by mouth 2 (two) times daily.   levothyroxine  112 MCG tablet Commonly known as: SYNTHROID  Take 112 mcg by mouth daily before breakfast.   lithium  carbonate 150 MG capsule Take 150 mg by mouth daily.   metFORMIN 500 MG tablet Commonly known as: GLUCOPHAGE Take 500 mg by mouth 2 (two) times daily with a meal.   ondansetron  4 MG tablet Commonly known as: ZOFRAN  Take 1 tablet (4 mg total) by mouth every 8 (eight) hours as needed for nausea or vomiting.   polyethylene glycol 17 g packet Commonly known as: MIRALAX  / GLYCOLAX  Take 17 g by mouth daily. What changed:  when  to take this reasons to take this   pravastatin  40 MG tablet Commonly known as: PRAVACHOL  Take 40 mg by mouth daily.   risperiDONE  0.5 MG tablet Commonly known as: RISPERDAL  Take 0.5 mg by mouth 2 (two) times daily.   Trelegy Ellipta 100-62.5-25 MCG/INH Aepb Generic drug: Fluticasone -Umeclidin-Vilant Inhale 1 puff into the lungs daily.   valbenazine 80 MG capsule Commonly known as: Ingrezza Take 1 capsule (80 mg total) by mouth daily.   Vitamin K2 100 MCG Tabs Take 100 mcg by mouth daily.         Follow-up Information     Donal Channing SQUIBB, FNP Follow up in 1 week(s).   Specialty: Family Medicine Contact information: [address] [place] [address] [phone]                 Allergies  Allergen Reactions   Penicillins Rash    Has patient had a PCN reaction causing immediate rash, facial/tongue/throat swelling, SOB or lightheadedness with  hypotension: No Has patient had a PCN reaction causing severe rash involving mucus membranes or skin necrosis: No Has patient had a PCN reaction that required hospitalization: No Has patient had a PCN reaction occurring within the last 10 years: No If all of the above answers are NO, then may proceed with Cephalosporin use.    Prednisone  Rash     The results of significant diagnostics from this hospitalization (including imaging, microbiology, ancillary and laboratory) are listed below for reference.   Consultations:   Procedures/Studies: CT ABDOMEN PELVIS W CONTRAST Result Date: 07/27/2024 EXAM: CT ABDOMEN AND PELVIS WITH CONTRAST 07/27/2024 09:41:06 AM TECHNIQUE: CT of the abdomen and pelvis was performed with the administration of 100 mL of iohexol  (OMNIPAQUE ) 300 MG/ML solution. Multiplanar reformatted images are provided for review. Automated exposure control, iterative reconstruction, and/or weight-based adjustment of the mA/kV was utilized to reduce the radiation dose to as low as reasonably  achievable. COMPARISON: CT 09/06/2023. CT abdomen and pelvis 09/07/2017. CLINICAL HISTORY: 63 year old female with LLQ abdominal pain. FINDINGS: LOWER CHEST: Mild motion artifact at the lung bases which appears stable and negative. LIVER: Stable liver since 2018. GALLBLADDER AND BILE DUCTS: Chronic cholecystectomy. No biliary ductal dilatation. SPLEEN: Stable spleen since 2018. PANCREAS: No acute abnormality. ADRENAL GLANDS: Chronic adrenal gland thickening also appears stable since 2018 and benign. KIDNEYS, URETERS AND BLADDER: No stones in the kidneys or ureters. No hydronephrosis. No perinephric or periureteral stranding. Mildly to moderately distended but otherwise unremarkable urinary bladder. GI AND BOWEL: Abundant large bowel retained stool and generalized large bowel redundancy. The cecum is on lax mesentery. Appendix is diminutive or absent. No dilated small bowel. Chronic postoperative changes to the stomach. Chronic possible vascular stent in the distal stomach and proximal duodenum which appears unchanged from last year and from a CT abdomen and pelvis 09/07/2017. PERITONEUM AND RETROPERITONEUM: No ascites. No free air. No pelvis free fluid. VASCULATURE: Advanced calcified atherosclerosis of the aorta and iliac arteries. Negative for abdominal aortic aneurysm. Major arterial structures remain patent despite atherosclerosis. Portal venous system appears to be patent. LYMPH NODES: No lymphadenopathy. REPRODUCTIVE ORGANS: No acute abnormality. BONES AND SOFT TISSUES: Chronic lumbar disc degeneration with vacuum disc. Chronic ventral abdominal postoperative changes are stable with no adverse features. No acute osseous abnormality. IMPRESSION: 1. Large bowel redundancy with abundant retained stool. Chronic gastric bypass appears stable. Urinary bladder distention. Advanced aortoiliac atherosclerosis. 2. No inflammatory process identified in the abdomen or pelvis. Electronically signed by: Helayne Hurst MD  07/27/2024 09:58 AM EDT RP Workstation: [id number]   CT Head Wo Contrast Result Date: 07/27/2024 EXAM: CT HEAD WITHOUT CONTRAST 07/27/2024 09:41:06 AM TECHNIQUE: CT of the head was performed without the administration of intravenous contrast. Automated exposure control, iterative reconstruction, and/or weight based adjustment of the mA/kV was utilized to reduce the radiation dose to as low as reasonably achievable. COMPARISON: None available. CLINICAL HISTORY: Delirium. Altered Mental Status. FINDINGS: BRAIN AND VENTRICLES: No acute hemorrhage. No evidence of acute infarct. No hydrocephalus. No extra-axial collection. No mass effect or midline shift. Atherosclerotic calcification of internal carotid arteries at skull base. ORBITS: No acute abnormality. SINUSES: No acute abnormality. SOFT TISSUES AND SKULL: No acute soft tissue abnormality. No skull fracture. IMPRESSION: 1. No acute intracranial abnormality. Electronically signed by: Helayne Hurst MD 07/27/2024 09:51 AM EDT RP Workstation: [id number]   DG Chest Portable 1 View Result Date: 07/27/2024 CLINICAL DATA:  Altered mental status EXAM: PORTABLE CHEST - 1 VIEW COMPARISON:  07/21/2024 FINDINGS: Unchanged mild cardiomegaly.  No pulmonary vascular congestion. Patchy opacities in the RIGHT mid lung again seen likely relates to pneumonitis. IMPRESSION: Unchanged patchy opacities of the RIGHT mid lung, most consistent with pneumonia. Electronically Signed   By: Aliene Lloyd M.D.   On: 07/27/2024 08:11   CT Angio Chest PE W/Cm &/Or Wo Cm Result Date: 07/21/2024 CLINICAL DATA:  Shortness of breath started this morning. Clinical concern for pulmonary embolus. EXAM: CT ANGIOGRAPHY CHEST WITH CONTRAST TECHNIQUE: Multidetector CT imaging of the chest was performed using the standard protocol during bolus administration of intravenous contrast. Multiplanar CT image reconstructions and MIPs were obtained to evaluate the vascular anatomy. RADIATION DOSE REDUCTION:  This exam was performed according to the departmental dose-optimization program which includes automated exposure control, adjustment of the mA and/or kV according to patient size and/or use of iterative reconstruction technique. CONTRAST:  75mL OMNIPAQUE  IOHEXOL  350 MG/ML SOLN COMPARISON:  08/15/2023 FINDINGS: Cardiovascular: The heart size is upper normal to borderline enlarged. Coronary artery calcification is evident. Moderate atherosclerotic calcification is noted in the wall of the thoracic aorta. Enlargement of the pulmonary outflow tract/main pulmonary arteries suggests pulmonary arterial hypertension. There is no filling defect within the opacified pulmonary arteries to suggest the presence of an acute pulmonary embolus. Mediastinum/Nodes: Similar mediastinal lymphadenopathy including low 14 mm short axis right paratracheal node. There is no hilar lymphadenopathy. The esophagus has normal imaging features. There is no axillary lymphadenopathy. Lungs/Pleura: Interlobular septal thickening noted in the upper lungs with interval development of patchy and nodular ground-glass opacity in the right upper and lower lobes. Areas of confluent nodularity are seen in the posterior right lower lobe measuring up to 9 mm (77/5). No substantial pleural effusion. Upper Abdomen: Stable nodular thickening of the adrenal glands, incompletely visualized and better characterized by CT abdomen 09/06/2023. Stable borderline to mild gastrohepatic and hepatoduodenal ligament lymphadenopathy. Surgical changes are noted in the stomach. No worrisome lytic or sclerotic osseous abnormality. Musculoskeletal: No worrisome lytic or sclerotic osseous abnormality. Review of the MIP images confirms the above findings. IMPRESSION: 1. No CT evidence for acute pulmonary embolus. 2. Interlobular septal thickening in the upper lungs suggest edema. 3. Interval development of patchy and nodular ground-glass opacity in the right upper and lower  lobes. Imaging features are compatible with multifocal pneumonia. 4. Areas of confluent nodularity are seen in the posterior right lower lobe measuring up to 9 mm. These are likely related to the infectious/inflammatory process but require follow-up to ensure resolution. 5. Stable mediastinal lymphadenopathy, potentially reactive. Attention on follow-up recommended. 6. Enlargement of the pulmonary outflow tract/main pulmonary arteries suggests pulmonary arterial hypertension. 7. Stable borderline to mild gastrohepatic and hepatoduodenal ligament lymphadenopathy. Attention on follow-up recommended. 8.  Aortic Atherosclerosis (ICD10-I70.0). Electronically Signed   By: Camellia Candle M.D.   On: 07/21/2024 06:52   DG Chest Portable 1 View Result Date: 07/21/2024 EXAM: 1 VIEW(S) XRAY OF THE CHEST 07/21/2024 04:51:00 AM COMPARISON: 12/10/2023 CLINICAL HISTORY: acute dyspnea, hx of CHF and COPD. Acute dyspnea hx of CHF and COPD FINDINGS: LUNGS AND PLEURA: No focal pulmonary opacity. No pulmonary edema. Possible small left pleural effusion. No pneumothorax. HEART AND MEDIASTINUM: Calcified aortic atherosclerosis. BONES AND SOFT TISSUES: No acute osseous abnormality. ABDOMEN: Paucity of bowel gas. IMPRESSION: 1. Possible small left pleural effusion. Electronically signed by: Evalene Coho MD 07/21/2024 05:10 AM EDT RP Workstation: [id number]      Labs: BNP (last 3 results) Recent Labs    08/14/23 2040 12/10/23 2051 07/21/24 0347  BNP 37.8 27.8 24.0  Basic Metabolic Panel: Recent Labs  Lab 07/27/24 0727  NA 144  K 4.3  CL 109  CO2 27  GLUCOSE 128*  BUN 27*  CREATININE 1.04*  CALCIUM  9.5   Liver Function Tests: Recent Labs  Lab 07/27/24 0727  AST 29  ALT 27  ALKPHOS 64  BILITOT 0.6  PROT 7.0  ALBUMIN 3.5   No results for input(s): LIPASE, AMYLASE in the last 168 hours. No results for input(s): AMMONIA in the last 168 hours. CBC: Recent Labs  Lab 07/27/24 0727  WBC 6.8   NEUTROABS 5.2  HGB 13.6  HCT 43.2  MCV 96.2  PLT 188   Cardiac Enzymes: No results for input(s): CKTOTAL, CKMB, CKMBINDEX, TROPONINI in the last 168 hours. BNP: Invalid input(s): POCBNP CBG: Recent Labs  Lab 07/28/24 0848 07/28/24 1226 07/28/24 1659 07/28/24 2235 07/29/24 0730  GLUCAP 211* 76 120* 154* 118*   D-Dimer No results for input(s): DDIMER in the last 72 hours. Hgb A1c No results for input(s): HGBA1C in the last 72 hours. Lipid Profile No results for input(s): CHOL, HDL, LDLCALC, TRIG, CHOLHDL, LDLDIRECT in the last 72 hours. Thyroid  function studies Recent Labs    07/27/24 0727  TSH 5.716*   Anemia work up Recent Labs    07/27/24 0727  VITAMINB12 283   Urinalysis    Component Value Date/Time   COLORURINE YELLOW (A) 07/27/2024 0728   APPEARANCEUR CLEAR (A) 07/27/2024 0728   LABSPEC 1.023 07/27/2024 0728   PHURINE 5.0 07/27/2024 0728   GLUCOSEU >=500 (A) 07/27/2024 0728   HGBUR NEGATIVE 07/27/2024 0728   BILIRUBINUR NEGATIVE 07/27/2024 0728   BILIRUBINUR negative 10/10/2023 1750   KETONESUR NEGATIVE 07/27/2024 0728   PROTEINUR NEGATIVE 07/27/2024 0728   UROBILINOGEN 0.2 10/10/2023 1750   NITRITE NEGATIVE 07/27/2024 0728   LEUKOCYTESUR TRACE (A) 07/27/2024 0728   Sepsis Labs Recent Labs  Lab 07/27/24 0727  WBC 6.8   Microbiology Recent Results (from the past 240 hours)  Resp panel by RT-PCR (RSV, Flu A&B, Covid) Anterior Nasal Swab     Status: None   Collection Time: 07/21/24  4:30 AM   Specimen: Anterior Nasal Swab  Result Value Ref Range Status   SARS Coronavirus 2 by RT PCR NEGATIVE NEGATIVE Final    Comment: (NOTE) SARS-CoV-2 target nucleic acids are NOT DETECTED.  The SARS-CoV-2 RNA is generally detectable in upper respiratory specimens during the acute phase of infection. The lowest concentration of SARS-CoV-2 viral copies this assay can detect is 138 copies/mL. A negative result does not preclude  SARS-Cov-2 infection and should not be used as the sole basis for treatment or other patient management decisions. A negative result may occur with  improper specimen collection/handling, submission of specimen other than nasopharyngeal swab, presence of viral mutation(s) within the areas targeted by this assay, and inadequate number of viral copies(<138 copies/mL). A negative result must be combined with clinical observations, patient history, and epidemiological information. The expected result is Negative.  Fact Sheet for Patients:  BloggerCourse.com  Fact Sheet for Healthcare Providers:  SeriousBroker.it  This test is no t yet approved or cleared by the [place]  FDA and  has been authorized for detection and/or diagnosis of SARS-CoV-2 by FDA under an Emergency Use Authorization (EUA). This EUA will remain  in effect (meaning this test can be used) for the duration of the COVID-19 declaration under Section 564(b)(1) of the Act, 21 U.S.C.section 360bbb-3(b)(1), unless the authorization is terminated  or revoked sooner.  Influenza A by PCR NEGATIVE NEGATIVE Final   Influenza B by PCR NEGATIVE NEGATIVE Final    Comment: (NOTE) The Xpert Xpress SARS-CoV-2/FLU/RSV plus assay is intended as an aid in the diagnosis of influenza from Nasopharyngeal swab specimens and should not be used as a sole basis for treatment. Nasal washings and aspirates are unacceptable for Xpert Xpress SARS-CoV-2/FLU/RSV testing.  Fact Sheet for Patients: BloggerCourse.com  Fact Sheet for Healthcare Providers: SeriousBroker.it  This test is not yet approved or cleared by the [place]  FDA and has been authorized for detection and/or diagnosis of SARS-CoV-2 by FDA under an Emergency Use Authorization (EUA). This EUA will remain in effect (meaning this test can be used) for the duration of  the COVID-19 declaration under Section 564(b)(1) of the Act, 21 U.S.C. section 360bbb-3(b)(1), unless the authorization is terminated or revoked.     Resp Syncytial Virus by PCR NEGATIVE NEGATIVE Final    Comment: (NOTE) Fact Sheet for Patients: BloggerCourse.com  Fact Sheet for Healthcare Providers: SeriousBroker.it  This test is not yet approved or cleared by the [place]  FDA and has been authorized for detection and/or diagnosis of SARS-CoV-2 by FDA under an Emergency Use Authorization (EUA). This EUA will remain in effect (meaning this test can be used) for the duration of the COVID-19 declaration under Section 564(b)(1) of the Act, 21 U.S.C. section 360bbb-3(b)(1), unless the authorization is terminated or revoked.  Performed at [facility], [address] [place], [place] [address]   Resp panel by RT-PCR (RSV, Flu A&B, Covid) Anterior Nasal Swab     Status: None   Collection Time: 07/27/24  7:28 AM   Specimen: Anterior Nasal Swab  Result Value Ref Range Status   SARS Coronavirus 2 by RT PCR NEGATIVE NEGATIVE Final    Comment: (NOTE) SARS-CoV-2 target nucleic acids are NOT DETECTED.  The SARS-CoV-2 RNA is generally detectable in upper respiratory specimens during the acute phase of infection. The lowest concentration of SARS-CoV-2 viral copies this assay can detect is 138 copies/mL. A negative result does not preclude SARS-Cov-2 infection and should not be used as the sole basis for treatment or other patient management decisions. A negative result may occur with  improper specimen collection/handling, submission of specimen other than nasopharyngeal swab, presence of viral mutation(s) within the areas targeted by this assay, and inadequate number of viral copies(<138 copies/mL). A negative result must be combined with clinical observations, patient history, and epidemiological information. The expected  result is Negative.  Fact Sheet for Patients:  BloggerCourse.com  Fact Sheet for Healthcare Providers:  SeriousBroker.it  This test is no t yet approved or cleared by the [place]  FDA and  has been authorized for detection and/or diagnosis of SARS-CoV-2 by FDA under an Emergency Use Authorization (EUA). This EUA will remain  in effect (meaning this test can be used) for the duration of the COVID-19 declaration under Section 564(b)(1) of the Act, 21 U.S.C.section 360bbb-3(b)(1), unless the authorization is terminated  or revoked sooner.       Influenza A by PCR NEGATIVE NEGATIVE Final   Influenza B by PCR NEGATIVE NEGATIVE Final    Comment: (NOTE) The Xpert Xpress SARS-CoV-2/FLU/RSV plus assay is intended as an aid in the diagnosis of influenza from Nasopharyngeal swab specimens and should not be used as a sole basis for treatment. Nasal washings and aspirates are unacceptable for Xpert Xpress SARS-CoV-2/FLU/RSV testing.  Fact Sheet for Patients: BloggerCourse.com  Fact Sheet for Healthcare Providers: SeriousBroker.it  This test is  not yet approved or cleared by the [place]  FDA and has been authorized for detection and/or diagnosis of SARS-CoV-2 by FDA under an Emergency Use Authorization (EUA). This EUA will remain in effect (meaning this test can be used) for the duration of the COVID-19 declaration under Section 564(b)(1) of the Act, 21 U.S.C. section 360bbb-3(b)(1), unless the authorization is terminated or revoked.     Resp Syncytial Virus by PCR NEGATIVE NEGATIVE Final    Comment: (NOTE) Fact Sheet for Patients: BloggerCourse.com  Fact Sheet for Healthcare Providers: SeriousBroker.it  This test is not yet approved or cleared by the [place]  FDA and has been authorized for detection and/or diagnosis of  SARS-CoV-2 by FDA under an Emergency Use Authorization (EUA). This EUA will remain in effect (meaning this test can be used) for the duration of the COVID-19 declaration under Section 564(b)(1) of the Act, 21 U.S.C. section 360bbb-3(b)(1), unless the authorization is terminated or revoked.  Performed at [facility], [address] [place], [place] [address]   Blood culture (routine x 2)     Status: None (Preliminary result)   Collection Time: 07/27/24 12:54 PM   Specimen: BLOOD  Result Value Ref Range Status   Specimen Description BLOOD BLOOD LEFT HAND  Final   Special Requests   Final    BOTTLES DRAWN AEROBIC AND ANAEROBIC Blood Culture results may not be optimal due to an inadequate volume of blood received in culture bottles   Culture   Final    NO GROWTH 2 DAYS Performed at [facility], [address], [place], [place] [address]    Report Status PENDING  Incomplete  Blood culture (routine x 2)     Status: Abnormal (Preliminary result)   Collection Time: 07/27/24  1:14 PM   Specimen: BLOOD  Result Value Ref Range Status   Specimen Description   Final    BLOOD BLOOD LEFT ARM Performed at [facility], [address], [place], [place] [address]    Special Requests   Final    BOTTLES DRAWN AEROBIC AND ANAEROBIC Blood Culture adequate volume Performed at [facility], [address] [place], [place] [address]    Culture  Setup Time   Final    GRAM POSITIVE COCCI ANAEROBIC BOTTLE ONLY CRITICAL RESULT CALLED TO, READ BACK BY AND VERIFIED WITH: [facility] GILCHRIST 07/28/24 9177 MW    Culture (A)  Final    AEROCOCCUS VIRIDANS Standardized susceptibility testing for this organism is not available. Performed at [facility], [address], [place], [place] [address]    Report Status PENDING  Incomplete  Blood Culture ID Panel (Reflexed)     Status: None   Collection Time: 07/27/24  1:14 PM  Result Value Ref Range Status    Enterococcus faecalis NOT DETECTED NOT DETECTED Final   Enterococcus Faecium NOT DETECTED NOT DETECTED Final   Listeria monocytogenes NOT DETECTED NOT DETECTED Final   Staphylococcus species NOT DETECTED NOT DETECTED Final   Staphylococcus aureus (BCID) NOT DETECTED NOT DETECTED Final   Staphylococcus epidermidis NOT DETECTED NOT DETECTED Final   Staphylococcus lugdunensis NOT DETECTED NOT DETECTED Final   Streptococcus species NOT DETECTED NOT DETECTED Final   Streptococcus agalactiae NOT DETECTED NOT DETECTED Final   Streptococcus pneumoniae NOT DETECTED NOT DETECTED Final   Streptococcus pyogenes NOT DETECTED NOT DETECTED Final   A.calcoaceticus-baumannii NOT DETECTED NOT DETECTED Final   Bacteroides fragilis NOT DETECTED NOT DETECTED Final   Enterobacterales NOT DETECTED NOT DETECTED  Final   Enterobacter cloacae complex NOT DETECTED NOT DETECTED Final   Escherichia coli NOT DETECTED NOT DETECTED Final   Klebsiella aerogenes NOT DETECTED NOT DETECTED Final   Klebsiella oxytoca NOT DETECTED NOT DETECTED Final   Klebsiella pneumoniae NOT DETECTED NOT DETECTED Final   Proteus species NOT DETECTED NOT DETECTED Final   Salmonella species NOT DETECTED NOT DETECTED Final   Serratia marcescens NOT DETECTED NOT DETECTED Final   Haemophilus influenzae NOT DETECTED NOT DETECTED Final   Neisseria meningitidis NOT DETECTED NOT DETECTED Final   Pseudomonas aeruginosa NOT DETECTED NOT DETECTED Final   Stenotrophomonas maltophilia NOT DETECTED NOT DETECTED Final   Candida albicans NOT DETECTED NOT DETECTED Final   Candida auris NOT DETECTED NOT DETECTED Final   Candida glabrata NOT DETECTED NOT DETECTED Final   Candida krusei NOT DETECTED NOT DETECTED Final   Candida parapsilosis NOT DETECTED NOT DETECTED Final   Candida tropicalis NOT DETECTED NOT DETECTED Final   Cryptococcus neoformans/gattii NOT DETECTED NOT DETECTED Final    Comment: Performed at [facility], [address]  [place], [place] [address]     Total time spend on discharging this patient, including the last patient exam, discussing the hospital stay, instructions for ongoing care as it relates to all pertinent caregivers, as well as preparing the medical discharge records, prescriptions, and/or referrals as applicable, is 35 minutes.    Ellouise Haber, MD  [facility] 07/29/2024, 11:31 AM

## 2024-07-30 LAB — CULTURE, BLOOD (ROUTINE X 2): Special Requests: ADEQUATE

## 2024-08-01 LAB — CULTURE, BLOOD (ROUTINE X 2): Culture: NO GROWTH

## 2024-08-11 ENCOUNTER — Emergency Department
Admission: EM | Admit: 2024-08-11 | Discharge: 2024-08-11 | Attending: Emergency Medicine | Admitting: Emergency Medicine

## 2024-08-11 ENCOUNTER — Other Ambulatory Visit: Payer: Self-pay

## 2024-08-11 DIAGNOSIS — I509 Heart failure, unspecified: Secondary | ICD-10-CM | POA: Diagnosis not present

## 2024-08-11 DIAGNOSIS — F0390 Unspecified dementia without behavioral disturbance: Secondary | ICD-10-CM | POA: Insufficient documentation

## 2024-08-11 DIAGNOSIS — J449 Chronic obstructive pulmonary disease, unspecified: Secondary | ICD-10-CM | POA: Insufficient documentation

## 2024-08-11 DIAGNOSIS — R4182 Altered mental status, unspecified: Secondary | ICD-10-CM | POA: Diagnosis present

## 2024-08-11 LAB — URINALYSIS, ROUTINE W REFLEX MICROSCOPIC
Bacteria, UA: NONE SEEN
Bilirubin Urine: NEGATIVE
Glucose, UA: >=500 mg/dL — AB
Hgb urine dipstick: NEGATIVE
Ketones, ur: NEGATIVE mg/dL
Leukocytes,Ua: NEGATIVE
Nitrite: NEGATIVE
Protein, ur: NEGATIVE mg/dL
Specific Gravity, Urine: 1.011 (ref 1.005–1.030)
pH: 5 (ref 5.0–8.0)

## 2024-08-11 LAB — COMPREHENSIVE METABOLIC PANEL WITH GFR
ALT: 18 U/L (ref 0–44)
AST: 31 U/L (ref 15–41)
Albumin: 3.7 g/dL (ref 3.5–5.0)
Alkaline Phosphatase: 64 U/L (ref 38–126)
Anion gap: 12 (ref 5–15)
BUN: 23 mg/dL (ref 8–23)
CO2: 25 mmol/L (ref 22–32)
Calcium: 9.8 mg/dL (ref 8.9–10.3)
Chloride: 104 mmol/L (ref 98–111)
Creatinine, Ser: 1.34 mg/dL — ABNORMAL HIGH (ref 0.44–1.00)
GFR, Estimated: 45 mL/min — ABNORMAL LOW (ref >60)
Glucose, Bld: 166 mg/dL — ABNORMAL HIGH (ref 70–99)
Potassium: 4.2 mmol/L (ref 3.5–5.1)
Sodium: 141 mmol/L (ref 135–145)
Total Bilirubin: 0.6 mg/dL (ref 0.0–1.2)
Total Protein: 7 g/dL (ref 6.5–8.1)

## 2024-08-11 LAB — CBC
HCT: 43.9 % (ref 36.0–46.0)
Hemoglobin: 13.4 g/dL (ref 12.0–15.0)
MCH: 29.6 pg (ref 26.0–34.0)
MCHC: 30.5 g/dL (ref 30.0–36.0)
MCV: 96.9 fL (ref 80.0–100.0)
Platelets: 180 K/uL (ref 150–400)
RBC: 4.53 MIL/uL (ref 3.87–5.11)
RDW: 12.7 % (ref 11.5–15.5)
WBC: 5.8 K/uL (ref 4.0–10.5)
nRBC: 0 % (ref 0.0–0.2)

## 2024-08-11 MED ORDER — SODIUM CHLORIDE 0.9 % IV BOLUS
1000.0000 mL | Freq: Once | INTRAVENOUS | Status: AC
  Administered 2024-08-11: 1000 mL via INTRAVENOUS

## 2024-08-11 NOTE — ED Triage Notes (Signed)
 [facility] due to a sudden onset of AMS. Pt was recently here at [facility] for same.

## 2024-08-11 NOTE — ED Provider Notes (Signed)
 [facility] Provider Note    Event Date/Time   First MD Initiated Contact with Patient 08/11/24 [phone]     (approximate)  History   Chief Complaint: Altered Mental Status  HPI  Tammy [name] is a 63 y.o. female with a past medical history of anxiety, bipolar, CHF, COPD, epilepsy, dementia, presents to the emergency department for altered mental status.  Patient is coming from her community care/day program where they noticed an altered mental state this morning.  Patient was just seen in the emergency department 10/21 for the same admitted to the hospital service and discharged 10/23.  Her workup at that time showed no significant findings, normal head CT and no significant abnormal lab work.  Ultimately was discharged home with the altered mental state thought to be due to her dementia.  Here patient is awake alert she is in no distress and has no complaints.  Physical Exam   Triage Vital Signs: ED Triage Vitals  Encounter Vitals Group     BP 08/11/24 0817 (!) 107/57     Girls Systolic BP Percentile --      Girls Diastolic BP Percentile --      Boys Systolic BP Percentile --      Boys Diastolic BP Percentile --      Pulse Rate 08/11/24 0817 64     Resp 08/11/24 0817 17     Temp 08/11/24 0817 97.6 F (36.4 C)     Temp Source 08/11/24 0817 Oral     SpO2 08/11/24 0817 98 %     Weight 08/11/24 0815 135 lb 12.9 oz (61.6 kg)     Height --      Head Circumference --      Peak Flow --      Pain Score 08/11/24 0814 0     Pain Loc --      Pain Education --      Exclude from Growth Chart --     Most recent vital signs: Vitals:   08/11/24 0817  BP: (!) 107/57  Pulse: 64  Resp: 17  Temp: 97.6 F (36.4 C)  SpO2: 98%    General: Awake, no distress.  CV:  Good peripheral perfusion.  Regular rate and rhythm  Resp:  Normal effort.  Equal breath sounds bilaterally.  Abd:  No distention.  Soft, nontender.  No rebound or guarding.  ED Results /  Procedures / Treatments   EKG  EKG viewed and interpreted by myself shows what appears to be a sinus rhythm at 56 bpm with a narrow QRS, normal axis, normal intervals, no concerning ST changes.  MEDICATIONS ORDERED IN ED: Medications  sodium chloride  0.9 % bolus 1,000 mL (has no administration in time range)     IMPRESSION / MDM / ASSESSMENT AND PLAN / ED COURSE  I reviewed the triage vital signs and the nursing notes.  Patient's presentation is most consistent with acute presentation with potential threat to life or bodily function.  Patient presents to the emergency department for altered mental status.  Patient has a history of dementia however they believe her altered state was more significant this morning.  Here the patient appears well she has no distress she has no complaints calm and cooperative.  We will check labs urinalysis.  Will IV hydrate and continue to closely monitor.  Patient did have 1 of 4 blood cultures positive during her last admission thought to be due to a contaminant we will recheck blood  cultures as a precaution although no fever, no white blood cell count elevation.  Patient's workup in the emergency department is reassuring.  Reassuring physical exam reassuring vital signs.  Patient CBC is normal with a normal white blood cell count chemistry is normal.  Urinalysis is normal no sign of infection.  Patient has received IV fluids.  She appears well with no acute concerns especially given her recent significant workup showing no significant findings.  Suspect her intermittent confusion is likely due to dementia  FINAL CLINICAL IMPRESSION(S) / ED DIAGNOSES   Dementia  Note:  This document was prepared using Dragon voice recognition software and may include unintentional dictation errors.   Dorothyann Drivers, MD 08/11/24 1116

## 2024-08-11 NOTE — Discharge Instructions (Signed)
 Your workup in the emergency department has shown normal results.  Please follow-up with your doctor for recheck/reevaluation.  Return to the emergency department for any acute concerns.

## 2024-08-11 NOTE — ED Notes (Signed)
 Called [facility] for transport to [facility] ,spoke with Ed

## 2024-08-16 LAB — CULTURE, BLOOD (ROUTINE X 2)
Culture: NO GROWTH
Culture: NO GROWTH

## 2024-08-30 ENCOUNTER — Ambulatory Visit (INDEPENDENT_AMBULATORY_CARE_PROVIDER_SITE_OTHER): Admitting: Podiatry

## 2024-08-30 DIAGNOSIS — E119 Type 2 diabetes mellitus without complications: Secondary | ICD-10-CM | POA: Diagnosis not present

## 2024-08-30 DIAGNOSIS — B351 Tinea unguium: Secondary | ICD-10-CM

## 2024-08-30 DIAGNOSIS — M79609 Pain in unspecified limb: Secondary | ICD-10-CM

## 2024-08-30 DIAGNOSIS — Z0189 Encounter for other specified special examinations: Secondary | ICD-10-CM

## 2024-08-30 NOTE — Progress Notes (Signed)
  Subjective:  Patient ID: Tammy [name], female    DOB: 09-Jul-1961,  MRN: [id number]  Tammy [name] presents to clinic today for for annual diabetic foot examination and preventative diabetic foot care for painful mycotic toenails of both feet that are difficult to trim. Pain interferes with daily activities and wearing enclosed shoe gear comfortably.  Chief Complaint  Patient presents with   Toe Pain    [facility]. Denies being diabetic   New problem(s): None.   PCP is Donal Channing SQUIBB, FNP.  Allergies  Allergen Reactions   Penicillins Rash    Has patient had a PCN reaction causing immediate rash, facial/tongue/throat swelling, SOB or lightheadedness with hypotension: No Has patient had a PCN reaction causing severe rash involving mucus membranes or skin necrosis: No Has patient had a PCN reaction that required hospitalization: No Has patient had a PCN reaction occurring within the last 10 years: No If all of the above answers are NO, then may proceed with Cephalosporin use.    Prednisone  Rash    Review of Systems: Negative except as noted in the HPI.  Objective: No changes noted in today's physical examination. There were no vitals filed for this visit. Tammy [name] is a pleasant 63 y.o. female WD, WN in NAD. AAO x 3.   Diabetic foot exam was performed with the following findings:   Vascular Examination: Capillary refill time immediate b/l. Palpable pedal pulses. Pedal hair present b/l. Pedal edema absent. No pain with calf compression b/l. Skin temperature gradient WNL b/l. No cyanosis or clubbing b/l. No ischemia or gangrene noted b/l.   Neurological Examination: Sensation grossly intact b/l with 10 gram monofilament. Vibratory sensation intact b/l.   Dermatological Examination: Pedal skin with normal turgor, texture and tone b/l.  No open wounds. No interdigital macerations.   Toenails 1-5 b/l thick, discolored, elongated  with subungual debris and pain on dorsal palpation.   No corns, calluses, nor porokeratotic lesions.  Musculoskeletal Examination: Muscle strength 5/5 to all lower extremity muscle groups bilaterally. No pain, crepitus or joint limitation noted with ROM bilateral LE. No gross bony deformities bilaterally.  Radiographs: None  Assessment/Plan: 1. Pain due to onychomycosis of nail   2. Diabetes mellitus without complication (HCC)   3. Encounter for diabetic foot exam (HCC)   Diabetic foot examination performed today. All patient's and/or POA's questions/concerns addressed on today's visit. Toenails 1-5 b/l debrided in length and girth without incident. Continue foot and shoe inspections daily. Monitor blood glucose per PCP/Endocrinologist's recommendations. Continue soft, supportive shoe gear daily. Report any pedal injuries to medical professional. Call office if there are any questions/concerns. -Patient/POA to call should there be question/concern in the interim.   Return in about 3 months (around 11/30/2024).  Delon LITTIE Merlin, DPM      [facility] LOCATION: [address], [place] [address]                   Office [phone]   [facility] LOCATION: [address] [place], [place] [address] Office [phone]

## 2024-09-05 ENCOUNTER — Encounter: Payer: Self-pay | Admitting: Podiatry

## 2024-09-06 ENCOUNTER — Other Ambulatory Visit: Payer: Self-pay

## 2024-09-06 ENCOUNTER — Emergency Department

## 2024-09-06 ENCOUNTER — Inpatient Hospital Stay
Admission: EM | Admit: 2024-09-06 | Discharge: 2024-09-10 | DRG: 194 | Disposition: A | Attending: Emergency Medicine | Admitting: Emergency Medicine

## 2024-09-06 DIAGNOSIS — F17210 Nicotine dependence, cigarettes, uncomplicated: Secondary | ICD-10-CM | POA: Diagnosis present

## 2024-09-06 DIAGNOSIS — J9611 Chronic respiratory failure with hypoxia: Secondary | ICD-10-CM

## 2024-09-06 DIAGNOSIS — E039 Hypothyroidism, unspecified: Secondary | ICD-10-CM | POA: Diagnosis present

## 2024-09-06 DIAGNOSIS — J189 Pneumonia, unspecified organism: Principal | ICD-10-CM | POA: Diagnosis present

## 2024-09-06 DIAGNOSIS — F419 Anxiety disorder, unspecified: Secondary | ICD-10-CM

## 2024-09-06 DIAGNOSIS — G40909 Epilepsy, unspecified, not intractable, without status epilepticus: Secondary | ICD-10-CM

## 2024-09-06 DIAGNOSIS — E1169 Type 2 diabetes mellitus with other specified complication: Secondary | ICD-10-CM

## 2024-09-06 DIAGNOSIS — E785 Hyperlipidemia, unspecified: Secondary | ICD-10-CM | POA: Diagnosis present

## 2024-09-06 DIAGNOSIS — G2401 Drug induced subacute dyskinesia: Secondary | ICD-10-CM

## 2024-09-06 DIAGNOSIS — F319 Bipolar disorder, unspecified: Secondary | ICD-10-CM

## 2024-09-06 DIAGNOSIS — E119 Type 2 diabetes mellitus without complications: Secondary | ICD-10-CM

## 2024-09-06 DIAGNOSIS — G9340 Encephalopathy, unspecified: Secondary | ICD-10-CM | POA: Diagnosis present

## 2024-09-06 DIAGNOSIS — K219 Gastro-esophageal reflux disease without esophagitis: Secondary | ICD-10-CM | POA: Diagnosis present

## 2024-09-06 LAB — BASIC METABOLIC PANEL WITH GFR
Anion gap: 12 (ref 5–15)
BUN: 19 mg/dL (ref 8–23)
CO2: 19 mmol/L — ABNORMAL LOW (ref 22–32)
Calcium: 9.2 mg/dL (ref 8.9–10.3)
Chloride: 109 mmol/L (ref 98–111)
Creatinine, Ser: 1.06 mg/dL — ABNORMAL HIGH (ref 0.44–1.00)
GFR, Estimated: 59 mL/min — ABNORMAL LOW (ref >60)
Glucose, Bld: 177 mg/dL — ABNORMAL HIGH (ref 70–99)
Potassium: 4.5 mmol/L (ref 3.5–5.1)
Sodium: 140 mmol/L (ref 135–145)

## 2024-09-06 LAB — CBC
HCT: 38.6 % (ref 36.0–46.0)
Hemoglobin: 12.2 g/dL (ref 12.0–15.0)
MCH: 30.7 pg (ref 26.0–34.0)
MCHC: 31.6 g/dL (ref 30.0–36.0)
MCV: 97 fL (ref 80.0–100.0)
Platelets: 184 K/uL (ref 150–400)
RBC: 3.98 MIL/uL (ref 3.87–5.11)
RDW: 12.9 % (ref 11.5–15.5)
WBC: 7.2 K/uL (ref 4.0–10.5)
nRBC: 0 % (ref 0.0–0.2)

## 2024-09-06 LAB — TROPONIN T, HIGH SENSITIVITY: Troponin T High Sensitivity: 16 ng/L (ref 0–19)

## 2024-09-06 MED ORDER — DOXYCYCLINE HYCLATE 100 MG PO CAPS: 100.0000 mg | ORAL_CAPSULE | Freq: Two times a day (BID) | ORAL | 0 refills | Status: DC

## 2024-09-06 MED ORDER — DOXYCYCLINE HYCLATE 100 MG PO TABS
100.0000 mg | ORAL_TABLET | Freq: Once | ORAL | Status: AC
  Administered 2024-09-06: 100 mg via ORAL
  Filled 2024-09-06: qty 1

## 2024-09-06 NOTE — ED Notes (Signed)
 Per Eileen at group home, she is going to call supervisor for transportation options.

## 2024-09-06 NOTE — ED Triage Notes (Signed)
 Pt arrives from [facility] group home via [facility].  C/O SHOB all day.  Pt on 2LNC Chronic  HX COPD.  Respirations even and unlabored, O2 93 2LNC, NAD.  A&Ox4 at this time. Denies weakness, dizziness. Endorses cough.

## 2024-09-06 NOTE — ED Provider Notes (Signed)
 [facility] Provider Note    Event Date/Time   First MD Initiated Contact with Patient 09/06/24 2117     (approximate)   History   Chief Complaint: Shortness of Breath   HPI  Tammy [name] is a 63 y.o. female with a history of bipolar disorder, COPD, dementia, diabetes who comes to the ED due to shortness of breath today.  Endorses nonproductive cough.  No fevers chills chest pain.  No dizziness.  Normal oral intake.        Past Medical History:  Diagnosis Date   Anxiety    Asthma    Bipolar 1 disorder (HCC)    CHF (congestive heart failure) (HCC)    COPD (chronic obstructive pulmonary disease) (HCC)    Dementia (HCC)    Diabetes mellitus without complication (HCC)    Epilepsy (HCC)    Seizures [facility])     Current Outpatient Rx   Order #: [id number] Class: Normal   Order #: [id number] Class: Historical Med   Order #: [id number] Class: Historical Med   Order #: [id number] Class: No Print   Order #: [id number] Class: Historical Med   Order #: [id number] Class: Historical Med   Order #: [id number] Class: Historical Med   Order #: [id number] Class: Historical Med   Order #: [id number] Class: Historical Med   Order #: [id number] Class: Normal   Order #: [id number] Class: Historical Med   Order #: [id number] Class: Historical Med   Order #: [id number] Class: Normal   Order #: [id number] Class: Historical Med   Order #: [id number] Class: Historical Med   Order #: [id number] Class: Historical Med   Order #: [id number] Class: Historical Med   Order #: [id number] Class: Historical Med   Order #: [id number] Class: Historical Med   Order #: [id number] Class: Historical Med   Order #: [id number] Class: Historical Med   Order #: [id number] Class: Historical Med   Order #: [id number] Class: Normal   Order #: [id number] Class: No Print   Order #: [id number] Class: Historical Med   Order #: [id number] Class: Historical Med   Order #: [id number] Class: Historical Med   Order #: [id number] Class: No  Print    Past Surgical History:  Procedure Laterality Date   APPENDECTOMY     hernia reapir     x 3      Physical Exam   Triage Vital Signs: ED Triage Vitals  Encounter Vitals Group     BP 09/06/24 2104 (!) 121/58     Girls Systolic BP Percentile --      Girls Diastolic BP Percentile --      Boys Systolic BP Percentile --      Boys Diastolic BP Percentile --      Pulse Rate 09/06/24 2104 60     Resp 09/06/24 2104 (!) 22     Temp 09/06/24 2104 98.1 F (36.7 C)     Temp Source 09/06/24 2104 Oral     SpO2 09/06/24 2104 98 %     Weight 09/06/24 2106 150 lb (68 kg)     Height 09/06/24 2106 5' 5 (1.651 m)     Head Circumference --      Peak Flow --      Pain Score 09/06/24 2105 10     Pain Loc --      Pain Education --      Exclude from Growth Chart --     Most recent vital signs: Vitals:   09/06/24 2104  BP: (!) 121/58  Pulse: 60  Resp: (!) 22  Temp: 98.1 F (36.7 C)  SpO2: 98%  General: Awake, no distress.  CV:  Good peripheral perfusion.  Regular rate rhythm Resp:  Normal effort.  Good air movement.  Normal expiratory phase.  No wheezing.  Small area of focal crackles in the right base. Abd:  No distention.  Soft nontender Other:  No lower extremity edema, no calf tenderness.  Moist oral mucosa   ED Results / Procedures / Treatments   Labs (all labs ordered are listed, but only abnormal results are displayed) Labs Reviewed  BASIC METABOLIC PANEL WITH GFR - Abnormal; Notable for the following components:      Result Value   CO2 19 (*)    Glucose, Bld 177 (*)    Creatinine, Ser 1.06 (*)    GFR, Estimated 59 (*)    All other components within normal limits  CBC  TROPONIN T, HIGH SENSITIVITY  TROPONIN T, HIGH SENSITIVITY     EKG Interpreted by me Sinus rhythm rate of 61.  Normal axis and intervals.  Normal QRS.  Slight ST elevation in inferior leads most likely due to wavy baseline.  Not diagnostic of STEMI.  Repeat EKG interpreted by me, sinus  rhythm rate of 60, no interval changes.  Normal ST segments.  No signs of ischemia.   RADIOLOGY Chest x-ray interpreted by me, shows opacity at the right base consistent with pneumonia.  Radiology report reviewed   PROCEDURES:  Procedures   MEDICATIONS ORDERED IN ED: Medications  doxycycline  (VIBRA -TABS) tablet 100 mg (100 mg Oral Given 09/06/24 2244)     IMPRESSION / MDM / ASSESSMENT AND PLAN / ED COURSE  I reviewed the triage vital signs and the nursing notes.  DDx: Dehydration, AKI, pneumonia, pleural effusion, unlikely ACS or PE.  Patient's presentation is most consistent with acute presentation with potential threat to life or bodily function.  Patient presents with shortness of breath and nonproductive cough, vital signs are normal on her baseline 2 L nasal cannula oxygen.  Not in distress.  Nontoxic.  Exam and x-ray consistent with community-acquired pneumonia.  She is suitable for outpatient management.  Will start doxycycline .       FINAL CLINICAL IMPRESSION(S) / ED DIAGNOSES   Final diagnoses:  Community acquired pneumonia of right lower lobe of lung  Chronic respiratory failure with hypoxia (HCC)     Rx / DC Orders   ED Discharge Orders          Ordered    doxycycline  (VIBRAMYCIN ) 100 MG capsule  2 times daily        09/06/24 2305             Note:  This document was prepared using Dragon voice recognition software and may include unintentional dictation errors.   Viviann Pastor, MD 09/06/24 [phone]

## 2024-09-07 ENCOUNTER — Emergency Department

## 2024-09-07 ENCOUNTER — Observation Stay

## 2024-09-07 ENCOUNTER — Encounter: Payer: Self-pay | Admitting: Family Medicine

## 2024-09-07 DIAGNOSIS — J189 Pneumonia, unspecified organism: Secondary | ICD-10-CM | POA: Diagnosis not present

## 2024-09-07 DIAGNOSIS — R569 Unspecified convulsions: Secondary | ICD-10-CM

## 2024-09-07 DIAGNOSIS — G40909 Epilepsy, unspecified, not intractable, without status epilepticus: Secondary | ICD-10-CM | POA: Diagnosis not present

## 2024-09-07 DIAGNOSIS — K219 Gastro-esophageal reflux disease without esophagitis: Secondary | ICD-10-CM | POA: Diagnosis not present

## 2024-09-07 DIAGNOSIS — G2401 Drug induced subacute dyskinesia: Secondary | ICD-10-CM

## 2024-09-07 DIAGNOSIS — F419 Anxiety disorder, unspecified: Secondary | ICD-10-CM | POA: Diagnosis not present

## 2024-09-07 DIAGNOSIS — G9340 Encephalopathy, unspecified: Secondary | ICD-10-CM | POA: Diagnosis present

## 2024-09-07 DIAGNOSIS — E1169 Type 2 diabetes mellitus with other specified complication: Secondary | ICD-10-CM | POA: Diagnosis not present

## 2024-09-07 DIAGNOSIS — F319 Bipolar disorder, unspecified: Secondary | ICD-10-CM

## 2024-09-07 DIAGNOSIS — F32A Depression, unspecified: Secondary | ICD-10-CM

## 2024-09-07 LAB — BASIC METABOLIC PANEL WITH GFR
Anion gap: 8 (ref 5–15)
BUN: 17 mg/dL (ref 8–23)
CO2: 23 mmol/L (ref 22–32)
Calcium: 9.4 mg/dL (ref 8.9–10.3)
Chloride: 112 mmol/L — ABNORMAL HIGH (ref 98–111)
Creatinine, Ser: 0.88 mg/dL (ref 0.44–1.00)
GFR, Estimated: >60 mL/min (ref >60)
Glucose, Bld: 104 mg/dL — ABNORMAL HIGH (ref 70–99)
Potassium: 4.1 mmol/L (ref 3.5–5.1)
Sodium: 144 mmol/L (ref 135–145)

## 2024-09-07 LAB — GLUCOSE, CAPILLARY
Glucose-Capillary: 101 mg/dL — ABNORMAL HIGH (ref 70–99)
Glucose-Capillary: 182 mg/dL — ABNORMAL HIGH (ref 70–99)
Glucose-Capillary: 116 mg/dL — ABNORMAL HIGH (ref 70–99)

## 2024-09-07 LAB — CBC
HCT: 38.4 % (ref 36.0–46.0)
Hemoglobin: 12.5 g/dL (ref 12.0–15.0)
MCH: 30.5 pg (ref 26.0–34.0)
MCHC: 32.6 g/dL (ref 30.0–36.0)
MCV: 93.7 fL (ref 80.0–100.0)
Platelets: 186 K/uL (ref 150–400)
RBC: 4.1 MIL/uL (ref 3.87–5.11)
RDW: 12.8 % (ref 11.5–15.5)
WBC: 5.7 K/uL (ref 4.0–10.5)
nRBC: 0 % (ref 0.0–0.2)

## 2024-09-07 LAB — TROPONIN T, HIGH SENSITIVITY: Troponin T High Sensitivity: 16 ng/L (ref 0–19)

## 2024-09-07 LAB — HIV ANTIBODY (ROUTINE TESTING W REFLEX): HIV Screen 4th Generation wRfx: NONREACTIVE

## 2024-09-07 MED ORDER — POLYETHYLENE GLYCOL 3350 17 G PO PACK
17.0000 g | PACK | Freq: Every day | ORAL | Status: DC
  Administered 2024-09-07 – 2024-09-10 (×4): 17 g via ORAL
  Filled 2024-09-07 (×4): qty 1

## 2024-09-07 MED ORDER — BUSPIRONE HCL 10 MG PO TABS
30.0000 mg | ORAL_TABLET | Freq: Two times a day (BID) | ORAL | Status: DC
  Administered 2024-09-07 – 2024-09-10 (×7): 30 mg via ORAL
  Filled 2024-09-07 (×7): qty 3

## 2024-09-07 MED ORDER — ALUM & MAG HYDROXIDE-SIMETH 200-200-20 MG/5ML PO SUSP: 30.0000 mL | Freq: Four times a day (QID) | ORAL | Status: DC | PRN

## 2024-09-07 MED ORDER — SODIUM CHLORIDE 0.9 % IV SOLN
500.0000 mg | INTRAVENOUS | Status: DC
  Administered 2024-09-07 – 2024-09-09 (×3): 500 mg via INTRAVENOUS
  Filled 2024-09-07 (×3): qty 5

## 2024-09-07 MED ORDER — HYDRALAZINE HCL 20 MG/ML IJ SOLN: 5.0000 mg | Freq: Four times a day (QID) | INTRAMUSCULAR | Status: DC | PRN

## 2024-09-07 MED ORDER — LORATADINE 10 MG PO TABS
10.0000 mg | ORAL_TABLET | Freq: Every day | ORAL | Status: DC
  Administered 2024-09-07 – 2024-09-10 (×4): 10 mg via ORAL
  Filled 2024-09-07 (×4): qty 1

## 2024-09-07 MED ORDER — FERROUS SULFATE 325 (65 FE) MG PO TABS
325.0000 mg | ORAL_TABLET | Freq: Every day | ORAL | Status: DC
  Administered 2024-09-07 – 2024-09-10 (×4): 325 mg via ORAL
  Filled 2024-09-07 (×4): qty 1

## 2024-09-07 MED ORDER — FAMOTIDINE 20 MG PO TABS
40.0000 mg | ORAL_TABLET | Freq: Every day | ORAL | Status: DC
  Administered 2024-09-07 – 2024-09-10 (×4): 40 mg via ORAL
  Filled 2024-09-07 (×4): qty 2

## 2024-09-07 MED ORDER — ONDANSETRON HCL 4 MG PO TABS: 4.0000 mg | ORAL_TABLET | Freq: Four times a day (QID) | ORAL | Status: DC | PRN

## 2024-09-07 MED ORDER — ONDANSETRON HCL 4 MG PO TABS: 4.0000 mg | ORAL_TABLET | Freq: Three times a day (TID) | ORAL | Status: DC | PRN

## 2024-09-07 MED ORDER — SODIUM CHLORIDE 0.9 % IV BOLUS: 1000.0000 mL | Freq: Once | INTRAVENOUS | Status: DC

## 2024-09-07 MED ORDER — IPRATROPIUM-ALBUTEROL 0.5-2.5 (3) MG/3ML IN SOLN
3.0000 mL | Freq: Four times a day (QID) | RESPIRATORY_TRACT | Status: DC
  Administered 2024-09-07: 3 mL via RESPIRATORY_TRACT
  Filled 2024-09-07: qty 3

## 2024-09-07 MED ORDER — PRAVASTATIN SODIUM 20 MG PO TABS
40.0000 mg | ORAL_TABLET | Freq: Every day | ORAL | Status: DC
  Administered 2024-09-07 – 2024-09-10 (×4): 40 mg via ORAL
  Filled 2024-09-07 (×4): qty 2

## 2024-09-07 MED ORDER — VITAMIN D 25 MCG (1000 UNIT) PO TABS
5000.0000 [IU] | ORAL_TABLET | Freq: Every day | ORAL | Status: DC
  Administered 2024-09-07 – 2024-09-10 (×4): 5000 [IU] via ORAL
  Filled 2024-09-07 (×4): qty 5

## 2024-09-07 MED ORDER — INSULIN ASPART 100 UNIT/ML IJ SOLN: 0.0000 [IU] | Freq: Every day | INTRAMUSCULAR | Status: DC

## 2024-09-07 MED ORDER — INSULIN ASPART 100 UNIT/ML IJ SOLN
0.0000 [IU] | Freq: Three times a day (TID) | INTRAMUSCULAR | Status: DC
  Administered 2024-09-07: 3 [IU] via SUBCUTANEOUS
  Administered 2024-09-08 (×2): 2 [IU] via SUBCUTANEOUS
  Administered 2024-09-09: 5 [IU] via SUBCUTANEOUS
  Administered 2024-09-10: 15 [IU] via SUBCUTANEOUS
  Administered 2024-09-10: 3 [IU] via SUBCUTANEOUS
  Filled 2024-09-07: qty 2
  Filled 2024-09-07: qty 5
  Filled 2024-09-07 (×2): qty 3
  Filled 2024-09-07: qty 2
  Filled 2024-09-07: qty 15

## 2024-09-07 MED ORDER — LEVOTHYROXINE SODIUM 112 MCG PO TABS
112.0000 ug | ORAL_TABLET | Freq: Every day | ORAL | Status: DC
  Administered 2024-09-08 – 2024-09-10 (×3): 112 ug via ORAL
  Filled 2024-09-07 (×4): qty 1

## 2024-09-07 MED ORDER — CARVEDILOL 6.25 MG PO TABS
3.1250 mg | ORAL_TABLET | Freq: Two times a day (BID) | ORAL | Status: DC
  Administered 2024-09-07 – 2024-09-10 (×6): 3.125 mg via ORAL
  Filled 2024-09-07 (×6): qty 1

## 2024-09-07 MED ORDER — ACETAMINOPHEN 650 MG RE SUPP: 650.0000 mg | Freq: Four times a day (QID) | RECTAL | Status: DC | PRN

## 2024-09-07 MED ORDER — LAMOTRIGINE 25 MG PO TABS: 150.0000 mg | ORAL_TABLET | Freq: Two times a day (BID) | ORAL | Status: DC

## 2024-09-07 MED ORDER — ONDANSETRON HCL 4 MG/2ML IJ SOLN: 4.0000 mg | Freq: Four times a day (QID) | INTRAMUSCULAR | Status: DC | PRN

## 2024-09-07 MED ORDER — VALBENAZINE TOSYLATE 40 MG PO CAPS
80.0000 mg | ORAL_CAPSULE | Freq: Every day | ORAL | Status: DC
  Administered 2024-09-07 – 2024-09-10 (×3): 80 mg via ORAL
  Filled 2024-09-07 (×6): qty 2

## 2024-09-07 MED ORDER — LEVETIRACETAM 250 MG PO TABS
250.0000 mg | ORAL_TABLET | Freq: Two times a day (BID) | ORAL | Status: DC
  Administered 2024-09-07 – 2024-09-10 (×7): 250 mg via ORAL
  Filled 2024-09-07 (×7): qty 1

## 2024-09-07 MED ORDER — LITHIUM CARBONATE 150 MG PO CAPS
150.0000 mg | ORAL_CAPSULE | Freq: Every day | ORAL | Status: DC
  Administered 2024-09-07 – 2024-09-10 (×4): 150 mg via ORAL
  Filled 2024-09-07 (×4): qty 1

## 2024-09-07 MED ORDER — EMPAGLIFLOZIN 25 MG PO TABS
25.0000 mg | ORAL_TABLET | Freq: Every day | ORAL | Status: DC
  Filled 2024-09-07: qty 1

## 2024-09-07 MED ORDER — OMEGA-3-ACID ETHYL ESTERS 1 G PO CAPS
1.0000 g | ORAL_CAPSULE | Freq: Two times a day (BID) | ORAL | Status: DC
  Administered 2024-09-07 – 2024-09-10 (×7): 1 g via ORAL
  Filled 2024-09-07 (×8): qty 1

## 2024-09-07 MED ORDER — SODIUM CHLORIDE 0.9 % IV SOLN
500.0000 mg | INTRAVENOUS | Status: DC
  Filled 2024-09-07: qty 5

## 2024-09-07 MED ORDER — SODIUM CHLORIDE 0.9 % IV SOLN
2.0000 g | INTRAVENOUS | Status: DC
  Administered 2024-09-07 – 2024-09-09 (×3): 2 g via INTRAVENOUS
  Filled 2024-09-07 (×4): qty 20

## 2024-09-07 MED ORDER — RENA-VITE PO TABS
1.0000 | ORAL_TABLET | Freq: Every day | ORAL | Status: DC
  Administered 2024-09-07 – 2024-09-10 (×4): 1 via ORAL
  Filled 2024-09-07 (×5): qty 1

## 2024-09-07 MED ORDER — IPRATROPIUM-ALBUTEROL 0.5-2.5 (3) MG/3ML IN SOLN
3.0000 mL | Freq: Two times a day (BID) | RESPIRATORY_TRACT | Status: DC
  Administered 2024-09-07 – 2024-09-10 (×6): 3 mL via RESPIRATORY_TRACT
  Filled 2024-09-07 (×6): qty 3

## 2024-09-07 MED ORDER — LORAZEPAM 2 MG/ML IJ SOLN: 1.0000 mg | INTRAMUSCULAR | Status: DC | PRN

## 2024-09-07 MED ORDER — LAMOTRIGINE 25 MG PO TABS
150.0000 mg | ORAL_TABLET | Freq: Two times a day (BID) | ORAL | Status: DC
  Administered 2024-09-07 – 2024-09-10 (×7): 150 mg via ORAL
  Filled 2024-09-07 (×7): qty 2

## 2024-09-07 MED ORDER — TRAZODONE HCL 50 MG PO TABS
25.0000 mg | ORAL_TABLET | Freq: Every evening | ORAL | Status: DC | PRN
  Administered 2024-09-07 – 2024-09-09 (×2): 25 mg via ORAL
  Filled 2024-09-07 (×2): qty 1

## 2024-09-07 MED ORDER — MAGNESIUM HYDROXIDE 400 MG/5ML PO SUSP: 30.0000 mL | Freq: Every day | ORAL | Status: DC | PRN

## 2024-09-07 MED ORDER — VITAMIN B-12 1000 MCG PO TABS
1000.0000 ug | ORAL_TABLET | Freq: Every day | ORAL | Status: DC
  Administered 2024-09-07 – 2024-09-10 (×4): 1000 ug via ORAL
  Filled 2024-09-07 (×5): qty 1

## 2024-09-07 MED ORDER — LEVETIRACETAM 250 MG PO TABS
250.0000 mg | ORAL_TABLET | Freq: Two times a day (BID) | ORAL | Status: DC
  Filled 2024-09-07: qty 1

## 2024-09-07 MED ORDER — HYDROCOD POLI-CHLORPHE POLI ER 10-8 MG/5ML PO SUER: 5.0000 mL | Freq: Two times a day (BID) | ORAL | Status: DC | PRN

## 2024-09-07 MED ORDER — RISPERIDONE 0.5 MG PO TABS
0.5000 mg | ORAL_TABLET | Freq: Two times a day (BID) | ORAL | Status: DC
  Administered 2024-09-07 – 2024-09-10 (×7): 0.5 mg via ORAL
  Filled 2024-09-07 (×7): qty 1

## 2024-09-07 MED ORDER — NICOTINE 21 MG/24HR TD PT24: 21.0000 mg | MEDICATED_PATCH | Freq: Every day | TRANSDERMAL | Status: DC | PRN

## 2024-09-07 MED ORDER — SODIUM CHLORIDE 0.9 % IV BOLUS
1000.0000 mL | Freq: Once | INTRAVENOUS | Status: AC
  Administered 2024-09-07: 1000 mL via INTRAVENOUS

## 2024-09-07 MED ORDER — BUSPIRONE HCL 10 MG PO TABS: 30.0000 mg | ORAL_TABLET | Freq: Two times a day (BID) | ORAL | Status: DC

## 2024-09-07 MED ORDER — SODIUM CHLORIDE 0.9 % IV SOLN: INTRAVENOUS | Status: AC

## 2024-09-07 MED ORDER — ENOXAPARIN SODIUM 40 MG/0.4ML IJ SOSY
40.0000 mg | PREFILLED_SYRINGE | INTRAMUSCULAR | Status: DC
  Administered 2024-09-07 – 2024-09-10 (×4): 40 mg via SUBCUTANEOUS
  Filled 2024-09-07 (×4): qty 0.4

## 2024-09-07 MED ORDER — ACETAMINOPHEN 325 MG PO TABS: 650.0000 mg | ORAL_TABLET | Freq: Four times a day (QID) | ORAL | Status: DC | PRN

## 2024-09-07 MED ORDER — LORAZEPAM 2 MG/ML IJ SOLN
0.5000 mg | Freq: Once | INTRAMUSCULAR | Status: AC
  Administered 2024-09-07: 0.5 mg via INTRAVENOUS
  Filled 2024-09-07: qty 1

## 2024-09-07 MED ORDER — LORAZEPAM 2 MG/ML IJ SOLN: 0.5000 mg | INTRAMUSCULAR | Status: DC | PRN

## 2024-09-07 MED ORDER — GUAIFENESIN ER 600 MG PO TB12
600.0000 mg | ORAL_TABLET | Freq: Two times a day (BID) | ORAL | Status: DC
  Administered 2024-09-07 – 2024-09-10 (×7): 600 mg via ORAL
  Filled 2024-09-07 (×7): qty 1

## 2024-09-07 MED ORDER — FINERENONE 20 MG PO TABS: 1.0000 | ORAL_TABLET | Freq: Every day | ORAL | Status: DC

## 2024-09-07 NOTE — ED Notes (Signed)
 Notified Keisha at group home that pt will not need transport as she is being admitted.

## 2024-09-07 NOTE — ED Notes (Signed)
 Pt noted to be drooling/foaming from mouth.  Suction provided.  EDP Fernand notified.

## 2024-09-07 NOTE — Assessment & Plan Note (Addendum)
 The patient will be admitted to a medical telemetry bed. Will continue antibiotic therapy with IV ceftriaxone  and IV azithromycin  to complete a 5-day course Mucolytic therapy be provided as well as duo nebs q.i.d. and q.4 hours p.r.n. We will follow blood cultures. 12/2: day 1 of abx.  Continuous pulse oximetry ordered

## 2024-09-07 NOTE — Hospital Course (Addendum)
 [name] is a 63 year old female with history of hypertension, non-insulin -dependent diabetes mellitus type 2, anxiety, history of seizure, dementia, COPD, bipolar 1, who presents to the ED for chief concerns of shortness of breath and cough.  09/07/2024: Patient admitted under hospitalist service.  Treated with azithromycin  500 mg IV daily, ceftriaxone  2 g IV daily to complete a 5-day course.  09/07/2024: I assumed care of the patient.  Continuous pulse oximetry initiated for the patient.  I do not believe patient's twitching motion reported is consistent with seizure.  Dr. Matthews sent a secure chat stating that she does not believe the description is consistent with seizure.  She suspect this is likely tardive dyskinesia.  No indication for neurology consult at this time.  TOC consulted

## 2024-09-07 NOTE — ED Notes (Signed)
 This RN spoke with Nanetta at group home at [phone] and notified that O2 tank is available and requested that she get in touch with administrator Marinda to let us  know that they will be able to provide transport. 2 VM left with Marinda by this RN.

## 2024-09-07 NOTE — ED Provider Notes (Signed)
 This patient was signed out to me awaiting transfer to her home facility.  Initially it seemed that she was brought in due to concern of some shortness of breath and the patient has been complaining of a cough.  She had been diagnosed with a pneumonia with plan for discharge back to the facility.  While here I was called to the bedside she had developed what appeared to be repetitive rumination, but she was not responsive, possibly tardive dyskinesia versus possibly a seizure.  Not entirely sure was more concerned given the poor response to verbal or physical stimuli.  There was no other noted seizure-like activity.  I gave her a dose of Ativan  and obtain CT imaging of the head.  The episode lasted about 30 minutes, and the patient had gradual return back to baseline but does not recall any of the event at all.  She then had a recurrence of this again, but it was much shorter.  No clear cause for this, concern possibly seizure-like episodes, will follow-up CT imaging results and reach out to medicine for admission.  Spoke with Dr. Lawence who has agreed to evaluate the patient to determine course of further medical management.   Fernand Rossie HERO, MD 09/07/24 [phone]

## 2024-09-07 NOTE — Progress Notes (Signed)
   09/07/24 0618  Assess: MEWS Score  Temp 97.8 F (36.6 C)  BP 131/74  Pulse Rate 66  Resp (!) 62 (Notified RN)  SpO2 100 %  O2 Device Nasal Cannula  Assess: MEWS Score  MEWS Temp 0  MEWS Systolic 0  MEWS Pulse 0  MEWS RR 3  MEWS LOC 0  MEWS Score 3  MEWS Score Color Yellow  Assess: if the MEWS score is Yellow or Red  Were vital signs accurate and taken at a resting state? Yes  Does the patient meet 2 or more of the SIRS criteria? No  MEWS guidelines implemented  Yes, yellow  Treat  MEWS Interventions Considered administering scheduled or prn medications/treatments as ordered  Take Vital Signs  Increase Vital Sign Frequency  Yellow: Q2hr x1, continue Q4hrs until patient remains green for 12hrs  Escalate  MEWS: Escalate Yellow: Discuss with charge nurse and consider notifying provider and/or RRT  Notify: Charge Nurse/RN  Name of Charge Nurse/RN Notified Presenter, Broadcasting  Assess: SIRS CRITERIA  SIRS Temperature  0  SIRS Respirations  1  SIRS Pulse 0  SIRS WBC 0  SIRS Score Sum  1     Patient arrived to floor with elevated respirations and a yellow MEWS. During admission process patient has multiple episodes where she quits responding to staff, starts having tachypnea and looking all around. Notified Dr. Lawence and ICU charge of patients episodes. Dr. Lawence placed new orders for EEG and neurology consult. Will continue to monitor patient.

## 2024-09-07 NOTE — Assessment & Plan Note (Signed)
 -  As needed nicotine patch ordered ?

## 2024-09-07 NOTE — ED Notes (Signed)
 Notified pt that assigned bed is double occupancy.  Pt states That's fine.

## 2024-09-07 NOTE — ED Notes (Signed)
 Notified MD Many of pt hypotension, requested fluid bolus.

## 2024-09-07 NOTE — ED Notes (Signed)
Pt updated on transportation situation

## 2024-09-07 NOTE — Assessment & Plan Note (Addendum)
 We will continue lithium  and check its level, Risperdal , and Keppra .

## 2024-09-07 NOTE — Progress Notes (Signed)
 Eeg done

## 2024-09-07 NOTE — ED Notes (Signed)
 Pt A&Ox3 speaking coherently to RN at this time.  O2 at 97 on 4LNC.  [place] decreased to 3L at this time.

## 2024-09-07 NOTE — Plan of Care (Signed)
 Chart reviewed. Movements sound like tardive dyskinesia not seizure. She has history of documented tardive dyskinesia as early as 2018 and remains on risperdal . D/w Dr. Sherre, she will reach out to me if she has any concerns about seizure after seeing patient on rounds. If not, no indication for full neurology consult at this time.  Elida Ross, MD [facility] [phone]  If 7pm- 7am, please page neurology on call as listed in AMION.

## 2024-09-07 NOTE — Assessment & Plan Note (Addendum)
 PDMP reviewed, currently no active prescriptions Home buspirone  30 mg p.o. twice daily, valbenazine  80 mg daily were continued on admission Patient received 1 dose of lorazepam  0.5 mg IV once at 2 AM on 12/2

## 2024-09-07 NOTE — Assessment & Plan Note (Addendum)
 This could be related to polypharmacy and specifically her psychiatric medication. Tardive dyskinesia.  Today mentation has improved, encephalopathy has resolved.

## 2024-09-07 NOTE — ED Notes (Signed)
 Pt returned from CT at this time.

## 2024-09-07 NOTE — Assessment & Plan Note (Deleted)
 Home Keppra  and Lamictal  were continued on admission Neurology consult will be obtained for her seizure-like activity by admitting provider Per admitting provider, he notified Dr. Matthews. EEG was ordered on admission 12/2: Ativan  1 mg IV as needed for seizure, 3 doses ordered with instructions to administer as appropriate and then let provider know

## 2024-09-07 NOTE — ED Notes (Signed)
 On rounds, pt noted to be foaming at mouth and not speaking to this RN and pt had been A&Ox4 previously.  Pt alert and looking at RN but not speaking.  Suction set up and pt suctioned.  O2 sats noted to be 87% at baseline 2LNC.  O2 increased to 4L, pt O2 increased to 97%.  EDP Fernand notified of acute change, assessed pt in room and ordered head CT and 0.5 Ativan  IV.

## 2024-09-07 NOTE — Assessment & Plan Note (Signed)
 Home antiglycemic medications will not be resumed on admission Insulin  SSI with at bedtime coverage ordered

## 2024-09-07 NOTE — Assessment & Plan Note (Signed)
 Home pravastatin  40 mg daily resumed on admission

## 2024-09-07 NOTE — ED Notes (Signed)
 Group home administrator Marinda [phone]) called and stated that they can not provide transportation d/t pt on chronic O2 and no available O2 tank.  ED secretary contacted [facility] and they stated that they are unable to provide transport d/t pt ambulatory and able to use W/C.  VM left with Marinda with this information and request to call back for transport.

## 2024-09-07 NOTE — H&P (Addendum)
 [place]   PATIENT NAME: Tammy [name]    MR#:  [id number]  DATE OF BIRTH:  Mar 06, 1961  DATE OF ADMISSION:  09/06/2024  PRIMARY CARE PHYSICIAN: Donal Channing SQUIBB, FNP   Patient is coming from: Home  REQUESTING/REFERRING PHYSICIAN: Fernand Rossie HERO, MD   CHIEF COMPLAINT:   Chief Complaint  Patient presents with   Shortness of Breath    HISTORY OF PRESENT ILLNESS:  Tammy [name] is a 63 y.o. female with medical history significant for anxiety, type diabetes mellitus, seizure disorder, dementia, COPD, CHF, and bipolar 1 disorder as well as asthma, who presented to the emergency room with acute onset of worsening cough and aspirated dyspnea and wheezing over the last 8 days.  She has been expectorating clear sputum.  No fever or chills.  No nausea or vomiting or abdominal pain.  No dysuria, oliguria or hematuria or flank pain.  She has been have episodes of likely tardive dyskinesia with rhythmic movement while opening her eyes with no response and briefly goes back to her normal state.  No bleeding diathesis.  ED Course: When she came to the ER heart rate was 58 and respiratory rate 21 with otherwise normal vital signs.  Labs revealed a CO2 19 and blood glucose was 87 with creatinine 1.06.  CBC was within normal. EKG as reviewed by me : EKG showed sinus or ectopic atrial rhythm with a rate of 62 without acute changes. Imaging: 2 view chest x-ray showed chronic increased interstitial lung markings with mild hazy superimposed mid right lung and right basal atelectasis and/or infiltrate. Noncontrasted head CT scan revealed no acute intracranial abnormalities.  The patient was given 1 L bolus of IV normal saline, 0.5 mg of IV Ativan   and 100 mg of p.o. doxycycline . PAST MEDICAL HISTORY:   Past Medical History:  Diagnosis Date   Anxiety    Asthma    Bipolar 1 disorder (HCC)    CHF (congestive heart failure) (HCC)    COPD (chronic obstructive pulmonary disease) (HCC)     Dementia (HCC)    Diabetes mellitus without complication (HCC)    Epilepsy (HCC)    Seizures (HCC)     PAST SURGICAL HISTORY:   Past Surgical History:  Procedure Laterality Date   APPENDECTOMY     hernia reapir     x 3      SOCIAL HISTORY:   Social History   Tobacco Use   Smoking status: Former    Current packs/day: 0.00    Types: Cigarettes    Quit date: 10/07/1998    Years since quitting: 25.9   Smokeless tobacco: Never  Substance Use Topics   Alcohol use: No    FAMILY HISTORY:   Family History  Problem Relation Age of Onset   Hypertension Mother    Kidney cancer Neg Hx    Bladder Cancer Neg Hx    Breast cancer Neg Hx     DRUG ALLERGIES:   Allergies  Allergen Reactions   Penicillins Rash    Has patient had a PCN reaction causing immediate rash, facial/tongue/throat swelling, SOB or lightheadedness with hypotension: No Has patient had a PCN reaction causing severe rash involving mucus membranes or skin necrosis: No Has patient had a PCN reaction that required hospitalization: No Has patient had a PCN reaction occurring within the last 10 years: No If all of the above answers are NO, then may proceed with Cephalosporin use.    Prednisone  Rash  REVIEW OF SYSTEMS:   ROS As per history of present illness. All pertinent systems were reviewed above. Constitutional, HEENT, cardiovascular, respiratory, GI, GU, musculoskeletal, neuro, psychiatric, endocrine, integumentary and hematologic systems were reviewed and are otherwise negative/unremarkable except for positive findings mentioned above in the HPI.   MEDICATIONS AT HOME:   Prior to Admission medications   Medication Sig Start Date End Date Taking? Authorizing Provider  doxycycline  (VIBRAMYCIN ) 100 MG capsule Take 1 capsule (100 mg total) by mouth 2 (two) times daily for 10 days. 09/06/24 09/16/24 Yes Viviann Pastor, MD  albuterol  (PROVENTIL ) (2.5 MG/3ML) 0.083% nebulizer solution Take 2.5 mg by  nebulization in the morning and at bedtime.    [provider]  albuterol  (VENTOLIN  HFA) 108 (90 Base) MCG/ACT inhaler Inhale 1-2 puffs into the lungs every 6 (six) hours as needed for wheezing or shortness of breath.    [provider]  aluminum -magnesium  hydroxide-simethicone  (MAALOX) 200-200-20 MG/5ML SUSP Take 30 mLs by mouth 4 (four) times daily as needed. Home med. 07/29/24   Awanda City, MD  b complex vitamins capsule Take 1 capsule by mouth daily. Every other day    [provider]  busPIRone  (BUSPAR ) 30 MG tablet Take 30 mg by mouth 2 (two) times daily.     [provider]  carvedilol  (COREG ) 3.125 MG tablet Take 3.125 mg by mouth 2 (two) times daily with a meal.     [provider]  cetirizine (ZYRTEC) 10 MG tablet Take 10 mg by mouth daily.    [provider]  cholecalciferol  (VITAMIN D3) 25 MCG (1000 UNIT) tablet Take 5,000 Units by mouth daily.    [provider]  Cyanocobalamin  1000 MCG SUBL Place 1 tablet (1,000 mcg total) under the tongue daily. 07/01/19   Brahmanday, Govinda R, MD  empagliflozin  (JARDIANCE ) 25 MG TABS tablet Take 25 mg by mouth daily.    [provider]  famotidine  (PEPCID ) 40 MG tablet Take 40 mg by mouth daily.    [provider]  ferrous sulfate  325 (65 FE) MG EC tablet Take 1 tablet (325 mg total) by mouth daily with breakfast. 08/20/19   Brahmanday, Govinda R, MD  fluticasone  (FLONASE ) 50 MCG/ACT nasal spray Place 1 spray into both nostrils daily.     [provider]  KERENDIA  20 MG TABS Take 1 tablet by mouth daily.    [provider]  lamoTRIgine  (LAMICTAL ) 150 MG tablet Take 150 mg by mouth 2 (two) times daily.    [provider]  levETIRAcetam  (KEPPRA ) 250 MG tablet Take 250 mg by mouth 2 (two) times daily.    [provider]  levothyroxine  (SYNTHROID ) 112 MCG tablet Take 112 mcg by mouth daily before breakfast.    [provider]   lithium  carbonate 150 MG capsule Take 150 mg by mouth daily.     [provider]  Menatetrenone (VITAMIN K2) 100 MCG TABS Take 100 mcg by mouth daily.    [provider]  metFORMIN  (GLUCOPHAGE ) 500 MG tablet Take 500 mg by mouth 2 (two) times daily with a meal.    [provider]  Omega-3 Fatty Acids (FISH OIL) 1000 MG CAPS Take 1,000 mg by mouth 2 (two) times daily.     [provider]  ondansetron  (ZOFRAN ) 4 MG tablet Take 1 tablet (4 mg total) by mouth every 8 (eight) hours as needed for nausea or vomiting. 09/06/23   Cyrena Mylar, MD  polyethylene glycol (MIRALAX  / GLYCOLAX ) 17 g packet  Take 17 g by mouth daily. 07/29/24   Awanda City, MD  pravastatin  (PRAVACHOL ) 40 MG tablet Take 40 mg by mouth daily.    [provider]  risperiDONE  (RISPERDAL ) 0.5 MG tablet Take 0.5 mg by mouth 2 (two) times daily.    [provider]  TRELEGY ELLIPTA 100-62.5-25 MCG/INH AEPB Inhale 1 puff into the lungs daily.  11/17/18   [provider]  valbenazine  (INGREZZA ) 80 MG capsule Take 1 capsule (80 mg total) by mouth daily. 07/23/24   Dezii, Alexandra, DO      VITAL SIGNS:  Blood pressure (!) 127/92, pulse 74, temperature 98.1 F (36.7 C), resp. rate 20, height 5' 5 (1.651 m), weight 68 kg, SpO2 98%.  PHYSICAL EXAMINATION:  Physical Exam  GENERAL:  63 y.o.-year-old Caucasian female patient lying in the bed with no acute distress.  EYES: Pupils equal, round, reactive to light and accommodation. No scleral icterus. Extraocular muscles intact.  HEENT: Head atraumatic, normocephalic. Oropharynx and nasopharynx clear.  NECK:  Supple, no jugular venous distention. No thyroid  enlargement, no tenderness.  LUNGS: Normal breath sounds bilaterally, no wheezing, rales,rhonchi or crepitation. No use of accessory muscles of respiration.  CARDIOVASCULAR: Regular rate and rhythm, S1, S2 normal. No murmurs, rubs, or gallops.  ABDOMEN: Soft, nondistended,  nontender. Bowel sounds present. No organomegaly or mass.  EXTREMITIES: No pedal edema, cyanosis, or clubbing.  NEUROLOGIC: Cranial nerves II through XII are intact. Muscle strength 5/5 in all extremities. Sensation intact. Gait not checked.  During interview she had rhythmic mouth movement as well as head movement in a vertical way where she was not responding for a couple of minutes and then she was back to her baseline mental status.  No urinary or stool incontinence or tongue bites were appreciated. PSYCHIATRIC: The patient is alert and oriented x 3.  Normal affect and good eye contact. SKIN: No obvious rash, lesion, or ulcer.   LABORATORY PANEL:   CBC Recent Labs  Lab 09/07/24 0608  WBC 5.7  HGB 12.5  HCT 38.4  PLT 186   ------------------------------------------------------------------------------------------------------------------  Chemistries  Recent Labs  Lab 09/07/24 0608  NA 144  K 4.1  CL 112*  CO2 23  GLUCOSE 104*  BUN 17  CREATININE 0.88  CALCIUM  9.4   ------------------------------------------------------------------------------------------------------------------  Cardiac Enzymes No results for input(s): TROPONINI in the last 168 hours. ------------------------------------------------------------------------------------------------------------------  RADIOLOGY:  CT Head Wo Contrast Result Date: 09/07/2024 EXAM: CT HEAD WITHOUT 09/07/2024 03:00:21 AM TECHNIQUE: CT of the head was performed without the administration of intravenous contrast. Automated exposure control, iterative reconstruction, and/or weight based adjustment of the mA/kV was utilized to reduce the radiation dose to as low as reasonably achievable. COMPARISON: 07/27/2024 CLINICAL HISTORY: AMS FINDINGS: BRAIN AND VENTRICLES: No acute intracranial hemorrhage. No mass effect or midline shift. No extra-axial fluid collection. No evidence of acute infarct. No hydrocephalus. ORBITS: No acute  abnormality. SINUSES AND MASTOIDS: No acute abnormality. SOFT TISSUES AND SKULL: No acute skull fracture. No acute soft tissue abnormality. IMPRESSION: 1. No acute intracranial abnormality. Electronically signed by: Franky Stanford MD 09/07/2024 03:06 AM EST RP Workstation: [id number]   DG Chest 2 View Result Date: 09/06/2024 CLINICAL DATA:  Shortness of breath. EXAM: CHEST - 2 VIEW COMPARISON:  July 27, 2024 FINDINGS: The heart size and mediastinal contours are within normal limits. There is marked severity calcification of the thoracic aorta. Mild, diffuse, chronic appearing increased interstitial lung markings are noted. Mild, hazy superimposed atelectasis and/or infiltrate is seen within the mid  right lung and right lung base. No pleural effusion or pneumothorax is identified. The visualized skeletal structures are unremarkable. IMPRESSION: Chronic appearing increased interstitial lung markings with mild, hazy superimposed mid right lung and right basilar atelectasis and/or infiltrate. Electronically Signed   By: Suzen Dials M.D.   On: 09/06/2024 21:45      IMPRESSION AND PLAN:  Assessment and Plan: * Acute encephalopathy - This could be related to polypharmacy and specifically her psychiatric medication.  It could also be related to her pneumonia. - She could be having tardive dyskinesia. - Differential diagnosis include seizure-like episodes. - Will be placed on seizure precautions.  CAP (community acquired pneumonia) - The patient will be admitted to a medical telemetry bed. - Will continue antibiotic therapy with IV Rocephin  and Zithromax . - Mucolytic therapy be provided as well as duo nebs q.i.d. and q.4 hours p.r.n. - We will follow blood cultures.   Anxiety and depression - Will continue her Ativan   Bipolar 1 disorder (HCC) - We will continue lithium  and check its level, Risperdal , and Keppra .  Seizure disorder (HCC) - Will continue her Keppra  and Lamictal . - Neurology  consult will be obtained for her seizure-like activity.   I notified Dr. Matthews. - EEG will be obtained.   DVT prophylaxis: Lovenox .  Advanced Care Planning:  Code Status: full code.  Family Communication:  The plan of care was discussed in details with the patient (and family). I answered all questions. The patient agreed to proceed with the above mentioned plan. Further management will depend upon hospital course. Disposition Plan: Back to previous home environment Consults called: Neurology All the records are reviewed and case discussed with ED provider.  Status is: Observation  I certify that at the time of admission, it is my clinical judgment that the patient will require  hospital care extending less than 2 midnights.                            Dispo: The patient is from: Home              Anticipated d/c is to: Home              Patient currently is not medically stable to d/c.              Difficult to place patient: No  [place] DELENA Peaches M.D on 09/07/2024 at 7:10 AM  [facility]   From 7 PM-7 AM, contact night-coverage [email or web address]  CC: Primary care physician; Donal Channing SQUIBB, FNP

## 2024-09-07 NOTE — Plan of Care (Signed)

## 2024-09-07 NOTE — Progress Notes (Addendum)
 PROGRESS NOTE  Jamey Demchak Shackleton  FMW:[id number] DOB: [birth year] DOA: 09/06/2024 PCP: Donal Channing SQUIBB, FNP   Ms. Jannessa Ogden is a 63 year old female with history of hypertension, non-insulin -dependent diabetes mellitus type 2, anxiety, history of seizure, dementia, COPD, bipolar 1, who presents to the ED for chief concerns of shortness of breath and cough.  09/07/2024: Patient admitted under hospitalist service.  Treated with azithromycin  500 mg IV daily, ceftriaxone  2 g IV daily to complete a 5-day course.  09/07/2024: I assumed care of the patient.  Continuous pulse oximetry initiated for the patient.  I do not believe patient's twitching motion reported is consistent with seizure.  Dr. Matthews sent a secure chat stating that she does not believe the description is consistent with seizure.  She suspect this is likely tardive dyskinesia.  No indication for neurology consult at this time.  TOC consulted  Assessment & Plan:   Principal Problem:   Acute encephalopathy Active Problems:   CAP (community acquired pneumonia)   Tardive dyskinesia   Seizure disorder (HCC)   Tobacco dependence due to cigarettes   Bipolar 1 disorder (HCC)   Dyslipidemia   Anxiety and depression   Hypothyroidism   Diabetes mellitus type II, non insulin  dependent (HCC)   GERD without esophagitis   Assessment and Plan:  * Acute encephalopathy This could be related to polypharmacy and specifically her psychiatric medication.  It could also be related to her pneumonia. She could be having tardive dyskinesia. Differential diagnosis include seizure-like episodes. Will be placed on seizure precautions.  Tardive dyskinesia I do not suspect the patient's twitching motion is consistent with seizure.  I suspect this is tardive dyskinesia.  CAP (community acquired pneumonia) The patient will be admitted to a medical telemetry bed. Will continue antibiotic therapy with IV ceftriaxone  and IV azithromycin  to  complete a 5-day course Mucolytic therapy be provided as well as duo nebs q.i.d. and q.4 hours p.r.n. We will follow blood cultures. 12/2: day 1 of abx.  Continuous pulse oximetry ordered  Bipolar 1 disorder (HCC) We will continue lithium  and check its level, Risperdal , and Keppra .  Tobacco dependence due to cigarettes As needed nicotine  patch ordered  Seizure disorder (HCC) Home Keppra  and Lamictal  were continued on admission Neurology consult will be obtained for her seizure-like activity by admitting provider Per admitting provider, he notified Dr. Matthews. EEG was ordered on admission 12/2: Ativan  1 mg IV as needed for seizure, 3 doses ordered with instructions to administer as appropriate and then let provider know  Anxiety and depression PDMP reviewed, currently no active prescriptions Home buspirone  30 mg p.o. twice daily, valbenazine  80 mg daily were continued on admission Patient received 1 dose of lorazepam  0.5 mg IV once at 2 AM on 12/2  Dyslipidemia Home pravastatin  40 mg daily resumed on admission  Diabetes mellitus type II, non insulin  dependent (HCC) Home antiglycemic medications will not be resumed on admission Insulin  SSI with at bedtime coverage ordered  DVT prophylaxis: Enoxaparin  Code Status: Full code Family Communication: No Disposition Plan: Pending TOC Level of care: Telemetry  Consultants:  TOC  Procedures:  None indicated  Antimicrobials: Azithromycin  500 mg IV daily, ceftriaxone  2 g IV daily  Subjective:  At bedside, patient was able to tell me her first and last name.  She was not able to tell me her age.  She knows she is in the hospital.  She was not able to tell me the current calendar year.  Objective: Vitals:   09/07/24 0400  09/07/24 0618 09/07/24 0654 09/07/24 0735  BP: (!) 90/53 131/74 (!) 127/92 (!) 144/55  Pulse: (!) 58 66 74 73  Resp:  (!) 62 20 (!) 22  Temp:  97.8 F (36.6 C) 98.1 F (36.7 C) 98.2 F (36.8 C)  TempSrc:   Axillary    SpO2: 95% 100% 98% 99%  Weight:      Height:        Intake/Output Summary (Last 24 hours) at 09/07/2024 1653 Last data filed at 09/07/2024 1300 Gross per 24 hour  Intake 600 ml  Output --  Net 600 ml   Filed Weights   09/06/24 2106  Weight: 68 kg   Examination:  General exam: Appears calm and comfortable  Respiratory system: Clear to auscultation. Respiratory effort normal. Cardiovascular system: S1 & S2 heard, RRR. No JVD, murmurs, rubs, gallops or clicks. No pedal edema. Gastrointestinal system: Abdomen is nondistended, soft and nontender. No organomegaly or masses felt. Normal bowel sounds heard. Central nervous system: Alert and oriented. No focal neurological deficits. Extremities: Symmetric 5 x 5 power. Skin: No rashes, lesions or ulcers Psychiatry: Judgement and insight appear normal. Mood & affect appropriate.   Data Reviewed: I have personally reviewed following labs and imaging studies  CBC: Recent Labs  Lab 09/06/24 2118 09/07/24 0608  WBC 7.2 5.7  HGB 12.2 12.5  HCT 38.6 38.4  MCV 97.0 93.7  PLT 184 186   Basic Metabolic Panel: Recent Labs  Lab 09/06/24 2118 09/07/24 0608  NA 140 144  K 4.5 4.1  CL 109 112*  CO2 19* 23  GLUCOSE 177* 104*  BUN 19 17  CREATININE 1.06* 0.88  CALCIUM  9.2 9.4   GFR: Estimated Creatinine Clearance: 58.9 mL/min (by C-G formula based on SCr of 0.88 mg/dL). Liver Function Tests: No results for input(s): AST, ALT, ALKPHOS, BILITOT, PROT, ALBUMIN in the last 168 hours. No results for input(s): LIPASE, AMYLASE in the last 168 hours. No results for input(s): AMMONIA in the last 168 hours. Coagulation Profile: No results for input(s): INR, PROTIME in the last 168 hours. Cardiac Enzymes: No results for input(s): CKTOTAL, CKMB, CKMBINDEX, TROPONINI in the last 168 hours. BNP (last 3 results) No results for input(s): PROBNP in the last 8760 hours. HbA1C: No results for input(s):  HGBA1C in the last 72 hours. CBG: Recent Labs  Lab 09/07/24 1225  GLUCAP 182*   Lipid Profile: No results for input(s): CHOL, HDL, LDLCALC, TRIG, CHOLHDL, LDLDIRECT in the last 72 hours. Thyroid  Function Tests: No results for input(s): TSH, T4TOTAL, FREET4, T3FREE, THYROIDAB in the last 72 hours. Anemia Panel: No results for input(s): VITAMINB12, FOLATE, FERRITIN, TIBC, IRON, RETICCTPCT in the last 72 hours. Sepsis Labs: No results for input(s): PROCALCITON, LATICACIDVEN in the last 168 hours.  No results found for this or any previous visit (from the past 240 hours).   Radiology Studies: CT Head Wo Contrast Result Date: 09/07/2024 EXAM: CT HEAD WITHOUT 09/07/2024 03:00:21 AM TECHNIQUE: CT of the head was performed without the administration of intravenous contrast. Automated exposure control, iterative reconstruction, and/or weight based adjustment of the mA/kV was utilized to reduce the radiation dose to as low as reasonably achievable. COMPARISON: 07/27/2024 CLINICAL HISTORY: AMS FINDINGS: BRAIN AND VENTRICLES: No acute intracranial hemorrhage. No mass effect or midline shift. No extra-axial fluid collection. No evidence of acute infarct. No hydrocephalus. ORBITS: No acute abnormality. SINUSES AND MASTOIDS: No acute abnormality. SOFT TISSUES AND SKULL: No acute skull fracture. No acute soft tissue abnormality. IMPRESSION: 1. No  acute intracranial abnormality. Electronically signed by: Franky Stanford MD 09/07/2024 03:06 AM EST RP Workstation: [id number]   DG Chest 2 View Result Date: 09/06/2024 CLINICAL DATA:  Shortness of breath. EXAM: CHEST - 2 VIEW COMPARISON:  July 27, 2024 FINDINGS: The heart size and mediastinal contours are within normal limits. There is marked severity calcification of the thoracic aorta. Mild, diffuse, chronic appearing increased interstitial lung markings are noted. Mild, hazy superimposed atelectasis and/or infiltrate is  seen within the mid right lung and right lung base. No pleural effusion or pneumothorax is identified. The visualized skeletal structures are unremarkable. IMPRESSION: Chronic appearing increased interstitial lung markings with mild, hazy superimposed mid right lung and right basilar atelectasis and/or infiltrate. Electronically Signed   By: Suzen Dials M.D.   On: 09/06/2024 21:45    Scheduled Meds:  busPIRone   30 mg Oral BID   carvedilol   3.125 mg Oral BID WC   cholecalciferol  5,000 Units Oral Daily   cyanocobalamin   1,000 mcg Oral Daily   enoxaparin  (LOVENOX ) injection  40 mg Subcutaneous Q24H   famotidine   40 mg Oral Daily   ferrous sulfate   325 mg Oral Q breakfast   Finerenone   1 tablet Oral Daily   guaiFENesin   600 mg Oral BID   insulin  aspart  0-15 Units Subcutaneous TID WC   insulin  aspart  0-5 Units Subcutaneous QHS   ipratropium-albuterol   3 mL Nebulization BID   lamoTRIgine   150 mg Oral BID   levETIRAcetam   250 mg Oral BID   levothyroxine   112 mcg Oral Q0600   lithium  carbonate  150 mg Oral Daily   loratadine   10 mg Oral Daily   multivitamin  1 tablet Oral Daily   omega-3 acid ethyl esters  1 g Oral BID   polyethylene glycol  17 g Oral Daily   pravastatin   40 mg Oral Daily   risperiDONE   0.5 mg Oral BID   valbenazine   80 mg Oral Daily   Continuous Infusions:  sodium chloride  125 mL/hr at 09/07/24 0700   azithromycin  500 mg (09/07/24 1110)   cefTRIAXone  (ROCEPHIN )  IV 2 g (09/07/24 0840)    LOS: 0 days   Time spent: 35 minutes  Dr. Sherre [facility] If 7PM-7AM, please contact night-coverage 09/07/2024, 4:53 PM

## 2024-09-07 NOTE — Assessment & Plan Note (Signed)
 I do not suspect the patient's twitching motion is consistent with seizure.  I suspect this is tardive dyskinesia.

## 2024-09-08 LAB — GLUCOSE, CAPILLARY
Glucose-Capillary: 121 mg/dL — ABNORMAL HIGH (ref 70–99)
Glucose-Capillary: 91 mg/dL (ref 70–99)
Glucose-Capillary: 147 mg/dL — ABNORMAL HIGH (ref 70–99)
Glucose-Capillary: 135 mg/dL — ABNORMAL HIGH (ref 70–99)

## 2024-09-08 LAB — LITHIUM LEVEL: Lithium Lvl: 0.2 mmol/L — ABNORMAL LOW (ref 0.60–1.20)

## 2024-09-08 MED ORDER — ORAL CARE MOUTH RINSE: 15.0000 mL | OROMUCOSAL | Status: DC | PRN

## 2024-09-08 NOTE — Evaluation (Signed)
 Physical Therapy Evaluation Patient Details Name: Tammy [name] MRN: [id number] DOB: 1961-07-19 Today's Date: 09/08/2024  History of Present Illness  [name] is a 63 year old female with history of hypertension, non-insulin -dependent diabetes mellitus type 2, anxiety, history of seizure, dementia, COPD, bipolar 1, who presents to the ED for chief concerns of shortness of breath and cough.  Clinical Impression  Patient noted to be in supine position at PT arrival in room, for an initial PT evaluation due to a decline in functional status, with baseline mobility reported as modI with RW, and currently requiring CGA with Rolator for 220' feet on 2 L/min. The patient is A&O x 2, presenting with good willingness to work with PT.  Vitals at rest 98% SpO? on 2 L/min. And post ambulation 83% SpO? and resting to normal after 2 min. Gait mechanic observations noted left hip drop. The overall clinical impression is that the patient presents with mild mobility limitations secondary to acute medical complications. Recommended skilled PT will address safety, mobility, and discharge planning.       If plan is discharge home, recommend the following: A little help with walking and/or transfers;A little help with bathing/dressing/bathroom;Direct supervision/assist for medications management;Supervision due to cognitive status;Assist for transportation;Assistance with feeding;Assistance with cooking/housework;Direct supervision/assist for financial management;Help with stairs or ramp for entrance   Can travel by private vehicle   Yes    Equipment Recommendations Other (comment)  Recommendations for Other Services       Functional Status Assessment Patient has had a recent decline in their functional status and/or demonstrates limited ability to make significant improvements in function in a reasonable and predictable amount of time     Precautions / Restrictions Precautions Precautions:  Fall Restrictions Weight Bearing Restrictions Per Provider Order: No      Mobility  Bed Mobility Overal bed mobility: Needs Assistance Bed Mobility: Supine to Sit, Sit to Supine     Supine to sit: Contact guard Sit to supine: Min assist        Transfers Overall transfer level: Needs assistance Equipment used: 1 person hand held assist Transfers: Sit to/from Stand, Bed to chair/wheelchair/BSC Sit to Stand: Min assist, Contact guard assist                Ambulation/Gait Ambulation/Gait assistance: Contact guard assist Gait Distance (Feet): 220 Feet Assistive device: Rollator (4 wheels) Gait Pattern/deviations: Trendelenburg, Decreased stride length       General Gait Details: pt demonstrated increased L hip drop during stance  Stairs            Wheelchair Mobility     Tilt Bed    Modified Rankin (Stroke Patients Only)       Balance Overall balance assessment: Needs assistance Sitting-balance support: Feet supported Sitting balance-Leahy Scale: Good     Standing balance support: Single extremity supported Standing balance-Leahy Scale: Fair                               Pertinent Vitals/Pain Pain Assessment Pain Assessment: No/denies pain    Home Living Family/patient expects to be discharged to:: Group home Living Arrangements: Group Home Available Help at Discharge: Available 24 hours/day Type of Home: House Home Access: Ramped entrance     Alternate Level Stairs-Number of Steps: Per chart review, residents are not allowed on the 2nd floor and all live on main floor. staff may live on second floor. Home Layout: Two level;Able to  live on main level with bedroom/bathroom Home Equipment: Rolling Walker (2 wheels);Grab bars - toilet;Grab bars - tub/shower      Prior Function               Mobility Comments: uses RW, would need assistance for safety. ADLs Comments: previous admission staff at group home reports pt gets  assist with ADLs and meals are provided     Extremity/Trunk Assessment        Lower Extremity Assessment Lower Extremity Assessment: Generalized weakness;LLE deficits/detail LLE Deficits / Details: L hip weakness    Cervical / Trunk Assessment Cervical / Trunk Assessment: Normal  Communication   Communication Communication: No apparent difficulties    Cognition Arousal: Alert Behavior During Therapy: WFL for tasks assessed/performed   PT - Cognitive impairments: History of cognitive impairments, Memory, Attention, Problem solving, Safety/Judgement                         Following commands: Intact       Cueing Cueing Techniques: Verbal cues, Gestural cues, Visual cues     General Comments      Exercises     Assessment/Plan    PT Assessment Patient needs continued PT services  PT Problem List Decreased strength;Decreased activity tolerance;Decreased mobility;Decreased cognition       PT Treatment Interventions Gait training;Functional mobility training;Therapeutic activities;Therapeutic exercise;Patient/family education;Neuromuscular re-education;Balance training    PT Goals (Current goals can be found in the Care Plan section)  Acute Rehab PT Goals PT Goal Formulation: Patient unable to participate in goal setting    Frequency Min 2X/week     Co-evaluation               AM-PAC PT 6 Clicks Mobility  Outcome Measure Help needed turning from your back to your side while in a flat bed without using bedrails?: A Little Help needed moving from lying on your back to sitting on the side of a flat bed without using bedrails?: A Little Help needed moving to and from a bed to a chair (including a wheelchair)?: A Little Help needed standing up from a chair using your arms (e.g., wheelchair or bedside chair)?: A Little Help needed to walk in hospital room?: A Little Help needed climbing 3-5 steps with a railing? : A Lot 6 Click Score: 17    End  of Session Equipment Utilized During Treatment: Gait belt Activity Tolerance: Patient tolerated treatment well Patient left: in bed;with bed alarm set;with call bell/phone within reach;with nursing/sitter in room Nurse Communication: Mobility status PT Visit Diagnosis: Other abnormalities of gait and mobility (R26.89);Muscle weakness (generalized) (M62.81)    Time: 9649-9589 PT Time Calculation (min) (ACUTE ONLY): 20 min   Charges:   PT Evaluation $PT Eval Low Complexity: 1 Low   PT General Charges $$ ACUTE PT VISIT: 1 Visit         Sherlean Lesches DPT, PT    Sherlean A Briseida Gittings 09/08/2024, 4:17 PM

## 2024-09-08 NOTE — Progress Notes (Addendum)
 PROGRESS NOTE   HPI was taken from Dr. Lawence: Tammy [name] is a 63 y.o. female with medical history significant for anxiety, type diabetes mellitus, seizure disorder, dementia, COPD, CHF, and bipolar 1 disorder as well as asthma, who presented to the emergency room with acute onset of worsening cough and aspirated dyspnea and wheezing over the last 8 days.  She has been expectorating clear sputum.  No fever or chills.  No nausea or vomiting or abdominal pain.  No dysuria, oliguria or hematuria or flank pain.  She has been have episodes of likely tardive dyskinesia with rhythmic movement while opening her eyes with no response and briefly goes back to her normal state.  No bleeding diathesis.   ED Course: When she came to the ER heart rate was 58 and respiratory rate 21 with otherwise normal vital signs.  Labs revealed a CO2 19 and blood glucose was 87 with creatinine 1.06.  CBC was within normal. EKG as reviewed by me : EKG showed sinus or ectopic atrial rhythm with a rate of 62 without acute changes. Imaging: 2 view chest x-ray showed chronic increased interstitial lung markings with mild hazy superimposed mid right lung and right basal atelectasis and/or infiltrate. Noncontrasted head CT scan revealed no acute intracranial abnormalities.   The patient was given 1 L bolus of IV normal saline, 0.5 mg of IV Ativan   and 100 mg of p.o. doxycycline Shree Espey Zangara  FMW:[id number] DOB: [birth year] DOA: 09/06/2024 PCP: Donal Channing SQUIBB, FNP  Assessment & Plan:   Principal Problem:   Acute encephalopathy Active Problems:   CAP (community acquired pneumonia)   Tardive dyskinesia   Seizure disorder (HCC)   Tobacco dependence due to cigarettes   Bipolar 1 disorder (HCC)   Dyslipidemia   Anxiety and depression   Hypothyroidism   Diabetes mellitus type II, non insulin  dependent (HCC)   GERD without esophagitis  Assessment and Plan: Acute encephalopathy: etiology unclear, possibly  secondary to polypharmacy. Oriented to person, place & month this morning. Re-orient prn    Likely tardive dyskinesia: continue w/ supportive care. Previous hx of tardive dyskinesia in 2018.  SABRA CAP: continue on IV rocephin , azithromycin , bronchodilators & encourage incentive spirometry  Chronic hypoxic respiratory failure: uses 2L [place] chronically at her group home    Bipolar 1 disorder: continue on home dose of lithium , risperdal     Smoker: received smoking cessation counseling already. Nicotine  patch to prevent w/drawal    Seizure disorder: continue on home dose of keppra , lamictal . EEG pending.  Depression: severity unknown. Continue on home dose of buspirone , valbenazine    HLD: continue on statin    DM2: well controlled, HbA1c 5.9 07/2024. Continue on SSI w/ accuchecks       DVT prophylaxis: lovenox  Code Status: full  Family Communication:  Disposition Plan: depends on PT/OT recs  Status is: Inpatient Remains inpatient appropriate because: severity of illness, requiring IV abxs    Level of care: Telemetry Consultants:    Procedures:  Antimicrobials: rocephin , azithromycin     Subjective: Pt c/o fatigue   Objective: Vitals:   09/07/24 1939 09/07/24 2013 09/08/24 0445 09/08/24 0731  BP:  (!) 136/58 (!) 154/70   Pulse:  62 80   Resp:  20 16   Temp:  98.4 F (36.9 C) 98.3 F (36.8 C)   TempSrc:      SpO2: 97% 100% 97% 97%  Weight:      Height:        Intake/Output Summary (  Last 24 hours) at 09/08/2024 0851 Last data filed at 09/07/2024 1900 Gross per 24 hour  Intake 840 ml  Output --  Net 840 ml   Filed Weights   09/06/24 2106  Weight: 68 kg    Examination:  General exam: Appears calm and comfortable  Respiratory system: Clear to auscultation. Respiratory effort normal. Cardiovascular system: S1 & S2+. No rubs, gallops or clicks Gastrointestinal system: Abdomen is nondistended, soft and nontender. Normal bowel sounds heard. Central nervous  system: Alert and oriented to self, place & month. Moves all extremities Psychiatry: Judgement and insight appears not at baseline. Flat mood and affect    Data Reviewed: I have personally reviewed following labs and imaging studies  CBC: Recent Labs  Lab 09/06/24 2118 09/07/24 0608  WBC 7.2 5.7  HGB 12.2 12.5  HCT 38.6 38.4  MCV 97.0 93.7  PLT 184 186   Basic Metabolic Panel: Recent Labs  Lab 09/06/24 2118 09/07/24 0608  NA 140 144  K 4.5 4.1  CL 109 112*  CO2 19* 23  GLUCOSE 177* 104*  BUN 19 17  CREATININE 1.06* 0.88  CALCIUM  9.2 9.4   GFR: Estimated Creatinine Clearance: 58.9 mL/min (by C-G formula based on SCr of 0.88 mg/dL). Liver Function Tests: No results for input(s): AST, ALT, ALKPHOS, BILITOT, PROT, ALBUMIN in the last 168 hours. No results for input(s): LIPASE, AMYLASE in the last 168 hours. No results for input(s): AMMONIA in the last 168 hours. Coagulation Profile: No results for input(s): INR, PROTIME in the last 168 hours. Cardiac Enzymes: No results for input(s): CKTOTAL, CKMB, CKMBINDEX, TROPONINI in the last 168 hours. BNP (last 3 results) No results for input(s): PROBNP in the last 8760 hours. HbA1C: No results for input(s): HGBA1C in the last 72 hours. CBG: Recent Labs  Lab 09/07/24 0836 09/07/24 1225 09/07/24 2151 09/08/24 0745  GLUCAP 101* 182* 116* 121*   Lipid Profile: No results for input(s): CHOL, HDL, LDLCALC, TRIG, CHOLHDL, LDLDIRECT in the last 72 hours. Thyroid  Function Tests: No results for input(s): TSH, T4TOTAL, FREET4, T3FREE, THYROIDAB in the last 72 hours. Anemia Panel: No results for input(s): VITAMINB12, FOLATE, FERRITIN, TIBC, IRON, RETICCTPCT in the last 72 hours. Sepsis Labs: No results for input(s): PROCALCITON, LATICACIDVEN in the last 168 hours.  No results found for this or any previous visit (from the past 240 hours).        Radiology Studies: CT Head Wo Contrast Result Date: 09/07/2024 EXAM: CT HEAD WITHOUT 09/07/2024 03:00:21 AM TECHNIQUE: CT of the head was performed without the administration of intravenous contrast. Automated exposure control, iterative reconstruction, and/or weight based adjustment of the mA/kV was utilized to reduce the radiation dose to as low as reasonably achievable. COMPARISON: 07/27/2024 CLINICAL HISTORY: AMS FINDINGS: BRAIN AND VENTRICLES: No acute intracranial hemorrhage. No mass effect or midline shift. No extra-axial fluid collection. No evidence of acute infarct. No hydrocephalus. ORBITS: No acute abnormality. SINUSES AND MASTOIDS: No acute abnormality. SOFT TISSUES AND SKULL: No acute skull fracture. No acute soft tissue abnormality. IMPRESSION: 1. No acute intracranial abnormality. Electronically signed by: Franky Stanford MD 09/07/2024 03:06 AM EST RP Workstation: [id number]   DG Chest 2 View Result Date: 09/06/2024 CLINICAL DATA:  Shortness of breath. EXAM: CHEST - 2 VIEW COMPARISON:  July 27, 2024 FINDINGS: The heart size and mediastinal contours are within normal limits. There is marked severity calcification of the thoracic aorta. Mild, diffuse, chronic appearing increased interstitial lung markings are noted. Mild, hazy superimposed atelectasis and/or infiltrate  is seen within the mid right lung and right lung base. No pleural effusion or pneumothorax is identified. The visualized skeletal structures are unremarkable. IMPRESSION: Chronic appearing increased interstitial lung markings with mild, hazy superimposed mid right lung and right basilar atelectasis and/or infiltrate. Electronically Signed   By: Suzen Dials M.D.   On: 09/06/2024 21:45        Scheduled Meds:  busPIRone   30 mg Oral BID   carvedilol   3.125 mg Oral BID WC   cholecalciferol  5,000 Units Oral Daily   cyanocobalamin   1,000 mcg Oral Daily   enoxaparin  (LOVENOX ) injection  40 mg Subcutaneous Q24H    famotidine   40 mg Oral Daily   ferrous sulfate   325 mg Oral Q breakfast   Finerenone   1 tablet Oral Daily   guaiFENesin   600 mg Oral BID   insulin  aspart  0-15 Units Subcutaneous TID WC   insulin  aspart  0-5 Units Subcutaneous QHS   ipratropium-albuterol   3 mL Nebulization BID   lamoTRIgine   150 mg Oral BID   levETIRAcetam   250 mg Oral BID   levothyroxine   112 mcg Oral Q0600   lithium  carbonate  150 mg Oral Daily   loratadine   10 mg Oral Daily   multivitamin  1 tablet Oral Daily   omega-3 acid ethyl esters  1 g Oral BID   polyethylene glycol  17 g Oral Daily   pravastatin   40 mg Oral Daily   risperiDONE   0.5 mg Oral BID   valbenazine   80 mg Oral Daily   Continuous Infusions:  azithromycin  500 mg (09/08/24 0834)   cefTRIAXone  (ROCEPHIN )  IV 2 g (09/08/24 0440)     LOS: 1 day       Anthony CHRISTELLA Pouch, MD [facility] Pager [phone]  If 7PM-7AM, please contact night-coverage [email or web address] 09/08/2024, 8:51 AM

## 2024-09-08 NOTE — Progress Notes (Signed)
 Heat applied to area of infiltration/redness on LFA per advice of pharmacist on unit.

## 2024-09-08 NOTE — Progress Notes (Signed)
 Mobility Specialist - Progress Note   09/08/24 1115  Mobility  Activity Pivoted/transferred to/from [facility]  Level of Assistance Contact guard assist, steadying assist  Assistive Device None  Distance Ambulated (ft) 4 ft  Activity Response Tolerated well  Mobility visit 1 Mobility  Mobility Specialist Start Time (ACUTE ONLY) 1102  Mobility Specialist Stop Time (ACUTE ONLY) 1112  Mobility Specialist Time Calculation (min) (ACUTE ONLY) 10 min   MS responding to bed alarm. Pt supine upon entry, utilizing RA. Pt transferred to/from [facility] via SPT CGA, tolerated well. Once seated Pt expressed pain of LUE (forearm), RN notified. Pt returned to bed, left supine with alarm set and needs within reach.  America Silvan Mobility Specialist 09/08/24 11:18 AM

## 2024-09-08 NOTE — Evaluation (Signed)
 Occupational Therapy Evaluation Patient Details Name: Tammy [name] MRN: [id number] DOB: [birth year] Today's Date: 09/08/2024   History of Present Illness   [name] is a 63 year old female with history of hypertension, non-insulin -dependent diabetes mellitus type 2, anxiety, history of seizure, dementia, COPD, bipolar 1, who presents to the ED for chief concerns of shortness of breath and cough.     Clinical Impressions Patient presenting with decreased Ind in self care, balance, functional mobility, transfers, endurance, and safety awareness. Pt reports living at group home with use of RW for mobility. Staff assist pt with ADLs, medication, and meals.  Patient performing bed mobility with min A to EOB and stands with min HHA. Pt takes side steps with min A to get further up in bed. Pt having just went to 3 in 1 [facility] prior to therapist arrival and needing assistance with transfer and hygiene. Min A for sit >supine and pt resting in bed at end of session. Pt is tremulous during session but noted to have hx of tardive dyskinesia.  Patient will benefit from acute OT to increase overall independence in the areas of ADLs, functional mobility, and safety awareness in order to safely discharge.     If plan is discharge home, recommend the following:   A lot of help with walking and/or transfers;A lot of help with bathing/dressing/bathroom;Assistance with cooking/housework;Direct supervision/assist for medications management;Direct supervision/assist for financial management;Assist for transportation;Help with stairs or ramp for entrance;Supervision due to cognitive status     Functional Status Assessment   Patient has had a recent decline in their functional status and demonstrates the ability to make significant improvements in function in a reasonable and predictable amount of time.     Equipment Recommendations   None recommended by OT      Precautions/Restrictions    Precautions Precautions: Fall     Mobility Bed Mobility Overal bed mobility: Needs Assistance Bed Mobility: Supine to Sit, Sit to Supine     Supine to sit: Contact guard Sit to supine: Min assist        Transfers Overall transfer level: Needs assistance Equipment used: 1 person hand held assist Transfers: Sit to/from Stand, Bed to chair/wheelchair/BSC Sit to Stand: Min assist     Step pivot transfers: Min assist            Balance Overall balance assessment: Needs assistance Sitting-balance support: Feet supported Sitting balance-Leahy Scale: Good     Standing balance support: Single extremity supported Standing balance-Leahy Scale: Fair                             ADL either performed or assessed with clinical judgement   ADL Overall ADL's : Needs assistance/impaired                         Toilet Transfer: Minimal assistance;BSC/3in1 Statistician Details (indicate cue type and reason): simulated Toileting- Clothing Manipulation and Hygiene: Minimal assistance;Sit to/from stand Toileting - Clothing Manipulation Details (indicate cue type and reason): simulated             Vision Patient Visual Report: No change from baseline              Pertinent Vitals/Pain Pain Assessment Pain Assessment: No/denies pain     Extremity/Trunk Assessment Upper Extremity Assessment Upper Extremity Assessment: Generalized weakness   Lower Extremity Assessment Lower Extremity Assessment: Generalized weakness  Communication Communication Communication: No apparent difficulties   Cognition Arousal: Alert Behavior During Therapy: WFL for tasks assessed/performed Cognition: History of cognitive impairments             OT - Cognition Comments: Oriented to self                 Following commands: Intact       Cueing  General Comments   Cueing Techniques: Verbal cues;Gestural cues;Visual cues               Home Living Family/patient expects to be discharged to:: Group home Living Arrangements: Group Home Available Help at Discharge: Available 24 hours/day Type of Home: House Home Access: Ramped entrance     Home Layout: Two level;Able to live on main level with bedroom/bathroom Alternate Level Stairs-Number of Steps: Per chart review, residents are not allowed on the 2nd floor and all live on main floor. staff may live on second floor.   Bathroom Shower/Tub: Producer, Television/film/video: Standard     Home Equipment: Agricultural Consultant (2 wheels);Grab bars - toilet;Grab bars - tub/shower          Prior Functioning/Environment               Mobility Comments: uses RW, would need assistance for safety. ADLs Comments: previous admission staff at group home reports pt gets assist with ADLs and meals are provided    OT Problem List: Decreased strength;Impaired balance (sitting and/or standing);Decreased cognition;Decreased safety awareness;Cardiopulmonary status limiting activity;Decreased activity tolerance   OT Treatment/Interventions: Self-care/ADL training;Manual therapy;Therapeutic exercise;Patient/family education;Balance training;Energy conservation;Therapeutic activities;DME and/or AE instruction      OT Goals(Current goals can be found in the care plan section)   Acute Rehab OT Goals Patient Stated Goal: to go home OT Goal Formulation: With patient Time For Goal Achievement: 09/22/24 Potential to Achieve Goals: Fair ADL Goals Pt Will Perform Grooming: with supervision;standing Pt Will Perform Lower Body Dressing: with supervision;sit to/from stand Pt Will Transfer to Toilet: with supervision;ambulating Pt Will Perform Toileting - Clothing Manipulation and hygiene: with supervision;sit to/from stand   OT Frequency:  Min 2X/week       AM-PAC OT 6 Clicks Daily Activity     Outcome Measure Help from another person eating meals?: A Little Help from  another person taking care of personal grooming?: A Little Help from another person toileting, which includes using toliet, bedpan, or urinal?: A Lot Help from another person bathing (including washing, rinsing, drying)?: A Lot Help from another person to put on and taking off regular upper body clothing?: A Little Help from another person to put on and taking off regular lower body clothing?: A Lot 6 Click Score: 15   End of Session    Activity Tolerance: Patient limited by fatigue Patient left: in bed;with call bell/phone within reach;with bed alarm set  OT Visit Diagnosis: Unsteadiness on feet (R26.81);Repeated falls (R29.6);Muscle weakness (generalized) (M62.81)                Time: 8989-8976 OT Time Calculation (min): 13 min Charges:  OT General Charges $OT Visit: 1 Visit OT Evaluation $OT Eval Moderate Complexity: 1 [address] MS, OTR/L , CBIS ascom [phone]  09/08/24, 11:21 AM

## 2024-09-08 NOTE — Procedures (Signed)
 Routine EEG Report  ULAH OLMO is a 63 y.o. female with a history of seizure who is undergoing an EEG to evaluate for seizures.  Report: This EEG was acquired with electrodes placed according to the International 10-20 electrode system (including Fp1, Fp2, F3, F4, C3, C4, P3, P4, O1, O2, T3, T4, T5, T6, A1, A2, Fz, Cz, Pz). The following electrodes were missing or displaced: none.  The occipital dominant rhythm was 9 Hz. This activity is reactive to stimulation. Drowsiness was manifested by background fragmentation; deeper stages of sleep were identified by K complexes and sleep spindles. There was no focal slowing. There were no interictal epileptiform discharges. There were no electrographic seizures identified. There was no abnormal response to photic stimulation or hyperventilation.   Impression: This EEG was obtained while awake and asleep and is normal. Multiple instances of abnormal mouth movements consistent with tardive dyskinesia were captured and had no ictal correlate. These are NOT seizures.    Clinical Correlation: Normal EEGs, however, do not rule out epilepsy.  Elida Ross, MD [facility] [phone]  If 7pm- 7am, please page neurology on call as listed in AMION.

## 2024-09-09 LAB — GLUCOSE, CAPILLARY
Glucose-Capillary: 119 mg/dL — ABNORMAL HIGH (ref 70–99)
Glucose-Capillary: 240 mg/dL — ABNORMAL HIGH (ref 70–99)
Glucose-Capillary: 110 mg/dL — ABNORMAL HIGH (ref 70–99)
Glucose-Capillary: 126 mg/dL — ABNORMAL HIGH (ref 70–99)

## 2024-09-09 MED ORDER — BUDESONIDE 0.25 MG/2ML IN SUSP
0.2500 mg | Freq: Two times a day (BID) | RESPIRATORY_TRACT | Status: DC
  Administered 2024-09-09 – 2024-09-10 (×2): 0.25 mg via RESPIRATORY_TRACT
  Filled 2024-09-09 (×2): qty 2

## 2024-09-09 NOTE — Assessment & Plan Note (Signed)
No active seizures.  Continue with keppra, valproic acid and lacosamide.  

## 2024-09-09 NOTE — Assessment & Plan Note (Signed)
 Continue pantoprazole .

## 2024-09-09 NOTE — Progress Notes (Signed)
 Physical Therapy Treatment Patient Details Name: Tammy [name] MRN: [id number] DOB: Aug 10, 1961 Today's Date: 09/09/2024   History of Present Illness Tammy [name] is a 63 year old female with history of hypertension, non-insulin -dependent diabetes mellitus type 2, anxiety, history of seizure, dementia, COPD, bipolar 1, who presents to the ED for chief concerns of shortness of breath and cough.    PT Comments  Pt received upright in bed agreeable to PT. Pt on baseline 2 L/min maintaining sats > 90% at rest and with mobility. Pt found to be soiled in urine. Pt unaware. Reports having urinary incontinence at baseline. Pt able to sit EoB with supervision using bed features. Stands at supervision as well able to doff soiled gown without UE's on RW and donning clean gown. Pt completes ~220' of gait with supervision with intermittent CGA due to consistent L veering into obstacles. Pt able to correct with VC's but unable to eliminate veering. Pt does report this is baseline for her and is unsure of why she does it. Pt appears close to her baseline. Returns to sitting in recliner with all needs in reach. Updated D/c recs. Per pt and EMR pt has needed supervision at group home to return to prior environment.    If plan is discharge home, recommend the following: A little help with walking and/or transfers;A little help with bathing/dressing/bathroom;Direct supervision/assist for medications management;Supervision due to cognitive status;Assist for transportation;Assistance with feeding;Assistance with cooking/housework;Direct supervision/assist for financial management;Help with stairs or ramp for entrance   Can travel by private vehicle     Yes  Equipment Recommendations  None recommended by PT    Recommendations for Other Services       Precautions / Restrictions Precautions Precautions: Fall Restrictions Weight Bearing Restrictions Per Provider Order: No     Mobility  Bed  Mobility Overal bed mobility: Needs Assistance Bed Mobility: Supine to Sit     Supine to sit: Supervision, HOB elevated       Patient Response: Cooperative, Flat affect  Transfers Overall transfer level: Needs assistance Equipment used: Rolling walker (2 wheels) Transfers: Sit to/from Stand Sit to Stand: Supervision                Ambulation/Gait Ambulation/Gait assistance: Supervision, Contact guard assist Gait Distance (Feet): 220 Feet Assistive device: Rolling walker (2 wheels) Gait Pattern/deviations: Trendelenburg, Decreased stride length       General Gait Details: Has L veering needing intermittent CGA and VC's to correct from bumping into obstacles.   Stairs             Wheelchair Mobility     Tilt Bed Tilt Bed Patient Response: Cooperative, Flat affect  Modified Rankin (Stroke Patients Only)       Balance Overall balance assessment: Needs assistance Sitting-balance support: Feet supported Sitting balance-Leahy Scale: Good     Standing balance support: Single extremity supported Standing balance-Leahy Scale: Fair Standing balance comment: able to stand without UE support and doff soiled gown                            Communication Communication Communication: No apparent difficulties  Cognition Arousal: Alert Behavior During Therapy: WFL for tasks assessed/performed   PT - Cognitive impairments: History of cognitive impairments, Memory, Attention, Problem solving, Safety/Judgement                         Following commands: Intact  Cueing Cueing Techniques: Verbal cues, Gestural cues, Visual cues  Exercises      General Comments        Pertinent Vitals/Pain Pain Assessment Pain Assessment: No/denies pain    Home Living                          Prior Function            PT Goals (current goals can now be found in the care plan section) Acute Rehab PT Goals PT Goal Formulation:  Patient unable to participate in goal setting Progress towards PT goals: Progressing toward goals    Frequency    Min 2X/week      PT Plan      Co-evaluation              AM-PAC PT 6 Clicks Mobility   Outcome Measure  Help needed turning from your back to your side while in a flat bed without using bedrails?: A Little Help needed moving from lying on your back to sitting on the side of a flat bed without using bedrails?: A Little Help needed moving to and from a bed to a chair (including a wheelchair)?: A Little Help needed standing up from a chair using your arms (e.g., wheelchair or bedside chair)?: A Little Help needed to walk in hospital room?: A Little Help needed climbing 3-5 steps with a railing? : A Little 6 Click Score: 18    End of Session Equipment Utilized During Treatment: Gait belt;Oxygen Activity Tolerance: Patient tolerated treatment well Patient left: in chair;with call bell/phone within reach;with chair alarm set Nurse Communication: Mobility status PT Visit Diagnosis: Other abnormalities of gait and mobility (R26.89);Muscle weakness (generalized) (M62.81)     Time: 8649-8581 PT Time Calculation (min) (ACUTE ONLY): 28 min  Charges:    $Therapeutic Activity: 23-37 mins PT General Charges $$ ACUTE PT VISIT: 1 Visit                     Dorina HERO. Fairly IV, PT, DPT Physical Therapist- [place]  [facility] 09/09/2024, 3:30 PM

## 2024-09-09 NOTE — TOC PASRR Note (Signed)
 RE: Tammy [name] Date of Birth:  1961/01/28 Date: 09/09/24      To Whom It May Concern:   Please be advised that the above-named patient will require a short-term nursing home stay - anticipated 30 days or less for rehabilitation and strengthening.  The plan is for return home

## 2024-09-09 NOTE — Progress Notes (Signed)
 Progress Note   Patient: Tammy [name] FMW:[id number] DOB: 22-Aug-1961 DOA: 09/06/2024     2 DOS: the patient was seen and examined on 09/09/2024   Brief hospital course: Tammy [name] was admitted to the hospital with the working diagnosis of pneumonia.   63 year old female with history of hypertension, diabetes mellitus type 2, anxiety, history of seizure, dementia, COPD, bipolar 1, who presents to the ED for chief concerns of dyspnea and cough.  Chest radiograph with hyperinflation, positive right lower lobe atelectasis.   09/07/2024: Patient admitted under hospitalist service.  Treated with azithromycin  500 mg IV daily, ceftriaxone  2 g IV daily to complete a 5-day course.  09/07/2024: Continuous pulse oximetry initiated for the patient.  I do not believe patient's twitching motion reported is consistent with seizure.  Tammy [name] sent a secure chat stating that she does not believe the description is consistent with seizure.  She suspect this is likely tardive dyskinesia.  No indication for neurology consult at this time.  TOC consulted 12/04 patient clinically improving, plan to continue home health services at home.   Assessment and Plan: * CAP (community acquired pneumonia) Patient with improvement in her symptoms.  No leukocytosis and no fever,   I suspect patient had a COPD exacerbation and not pneumonia, (will rule out pneumonia for now)   Plan to continue bronchodilator therapy and inhaled corticosteroids.  Airway clearing techniques with flutter valve and incentive spirometer.  Stop antibiotic therapy.  02 saturation is 97% on 2 L/min per Tammy [name],.  Will need ambulatory oxymetry on room air prior to her discharge.  Smoking cessation counseling   Acute encephalopathy This could be related to polypharmacy and specifically her psychiatric medication. Tardive dyskinesia.  Today mentation has improved, encephalopathy has resolved.   Type 2 diabetes mellitus with hyperlipidemia  (HCC) Continue glucose cover and monitoring with insulin  sliding scale Fasting glucose today is 112 mg/dl  Continue with statin therapy    GERD without esophagitis Continue pantoprazole   Hypothyroidism Continue levothyroxine    Tobacco dependence due to cigarettes As needed nicotine  patch ordered  Dyslipidemia Home pravastatin  40 mg daily resumed on admission  Seizure disorder (HCC) No active seizures.   Bipolar 1 disorder (HCC) Continue with lithium , Risperdal , and Keppra . Lithium  level is 0.2   Anxiety and depression Home buspirone  30 mg p.o. twice daily, valbenazine  80 mg daily        Subjective: Patient with no chest pain, dyspnea is improving, has no cough.   Physical Exam: Vitals:   09/08/24 1934 09/08/24 2014 09/09/24 0412 09/09/24 0818  BP:  (!) 158/72 130/65 121/80  Pulse:  (!) 59 80 67  Resp:  20 16 17   Temp:  97.9 F (36.6 C) (!) 97.4 F (36.3 C) 98.5 F (36.9 C)  TempSrc:    Oral  SpO2: 99% 100% 97% 97%  Weight:      Height:       Neurology awake and alert ENT with mild pallor with no icterus Cardiovascular with S1 and S2 present and regular with no gallops, rubs or murmurs Respiratory with prolonged expiratory phase with bilateral rales with no wheezing or rhonchi  Abdomen with no distention, soft and non tender No lower extremity edema  Data Reviewed:    Family Communication: no family at the bedside   Disposition: Status is: Inpatient Remains inpatient appropriate because: recovering pneumonia   Planned Discharge Destination: Home     Author: Elidia Toribio Furnace, MD 09/09/2024 5:34 PM  For on call review  [email or web address]

## 2024-09-09 NOTE — TOC PASRR Note (Signed)
 RE: Tammy [name]   Date of Birth: 1960/12/06   Date: 09/09/24    To Whom It May Concern:   Please be advised that the above-named patient has a primary diagnosis of dementia which supersedes any psychiatric diagnosis.

## 2024-09-09 NOTE — Assessment & Plan Note (Signed)
 Continue levothyroxine 

## 2024-09-09 NOTE — TOC Initial Note (Signed)
 Transition of Care [facility]) - Initial/Assessment Note    Patient Details  Name: Tammy [name] MRN: [id number] Date of Birth: March 09, 1961  Transition of [facility]) CM/SW Contact:    Corean ONEIDA Haddock, RN Phone Number: 09/09/2024, 10:56 AM  Clinical Narrative:                  Noted patient admitted from [facility] home Currently A&O x1 Therapy recs currently for SNF Attemtped to call sister Velia to discuss, phone number is not a working number Call placed to home administrator Marinda [phone]). VM left PASRR pending, clinical uploaded to NCMUST for review Bed search initiated         Patient Goals and CMS Choice            Expected Discharge Plan and Services                                              Prior Living Arrangements/Services                       Activities of Daily Living   ADL Screening (condition at time of admission) Independently performs ADLs?: No Does the patient have a NEW difficulty with bathing/dressing/toileting/self-feeding that is expected to last >3 days?: No Does the patient have a NEW difficulty with getting in/out of bed, walking, or climbing stairs that is expected to last >3 days?: No Does the patient have a NEW difficulty with communication that is expected to last >3 days?: No Is the patient deaf or have difficulty hearing?: No Does the patient have difficulty seeing, even when wearing glasses/contacts?: No Does the patient have difficulty concentrating, remembering, or making decisions?: Yes  Permission Sought/Granted                  Emotional Assessment              Admission diagnosis:  Chronic respiratory failure with hypoxia (HCC) [J96.11] Acute encephalopathy [G93.40] Community acquired pneumonia of right lower lobe of lung [J18.9] Patient Active Problem List   Diagnosis Date Noted   Acute encephalopathy 09/07/2024   Bipolar 1 disorder (HCC) 09/07/2024   Anxiety and  depression 09/07/2024   Tardive dyskinesia 09/07/2024   Acute metabolic encephalopathy 07/27/2024   AMS (altered mental status) 07/27/2024   Pneumonia 07/22/2024   CAP (community acquired pneumonia) 07/21/2024   PNA (pneumonia) 07/21/2024   Anxiety 12/11/2023   GERD without esophagitis 12/11/2023   Type 2 diabetes mellitus with chronic kidney disease, with long-term current use of insulin  (HCC) 12/11/2023   COPD exacerbation (HCC) 12/10/2023   Diabetes mellitus type II, non insulin  dependent (HCC) 08/15/2023   Essential hypertension 08/15/2023   Dyslipidemia 08/15/2023   Tobacco dependence due to cigarettes 08/15/2023   Chronic diastolic CHF (congestive heart failure) (HCC) 08/14/2023   Hypothyroidism 08/14/2023   Bipolar mood disorder (HCC) 08/14/2023   Seizure-like activity (HCC) 08/03/2019   B12 deficiency 06/23/2019   Iron deficiency 06/21/2019   History of seizure 04/07/2019   Seizure disorder (HCC) 03/22/2019   Normocytic anemia 03/18/2019   Influenza A 11/10/2018   Hyponatremia 10/26/2018   Syncopal episodes 06/23/2017   Acute on chronic respiratory failure with hypoxia (HCC) 03/15/2017   COPD with acute exacerbation (HCC) 03/15/2017   Acute on chronic respiratory failure (HCC) 03/15/2017   PCP:  Donal Rosella  SHAUNNA, FNP Pharmacy:   [facility] - [place], [place] - [address] [place] [address] Phone: [phone] Fax: [phone]  [facility] [phone] - [place], [place] - [address] [place] [address] Phone: [phone] Fax: [phone]  [facility] [address] [place] [address] Phone: [phone] Fax: [phone]     Social Drivers of Health (SDOH) Social History: SDOH Screenings   Food Insecurity: No Food Insecurity (09/07/2024)  Housing: Low Risk  (09/07/2024)  Transportation Needs: No Transportation Needs (09/07/2024)   Utilities: Not At Risk (09/07/2024)  Financial Resource Strain: Low Risk  (10/29/2018)  Physical Activity: Unknown (10/29/2018)  Social Connections: Unknown (09/07/2024)  Stress: No Stress Concern Present (10/29/2018)  Tobacco Use: Medium Risk (09/07/2024)   SDOH Interventions:     Readmission Risk Interventions     No data to display

## 2024-09-09 NOTE — NC FL2 (Signed)
 [place]  MEDICAID [id number] LEVEL OF CARE FORM     IDENTIFICATION  Patient Name: Tammy [name] [facility] Birthdate: 1961/04/10 Sex: female Admission Date (Current Location): 09/06/2024  [facility] [place] Number:  Chiropodist and Address:  [facility], [address], [place], [place] [address]      Provider Number: [id number]  Attending Physician Name and Address:  Noralee Elidia Toribio DEWAINE  Relative Name and Phone Number:       Current Level of Care: Hospital Recommended Level of Care: Skilled Nursing Facility Prior Approval Number:    Date Approved/Denied:   PASRR Number: Pending  Discharge Plan: SNF    Current Diagnoses: Patient Active Problem List   Diagnosis Date Noted   Acute encephalopathy 09/07/2024   Bipolar 1 disorder (HCC) 09/07/2024   Anxiety and depression 09/07/2024   Tardive dyskinesia 09/07/2024   Acute metabolic encephalopathy 07/27/2024   AMS (altered mental status) 07/27/2024   Pneumonia 07/22/2024   CAP (community acquired pneumonia) 07/21/2024   PNA (pneumonia) 07/21/2024   Anxiety 12/11/2023   GERD without esophagitis 12/11/2023   Type 2 diabetes mellitus with chronic kidney disease, with long-term current use of insulin  (HCC) 12/11/2023   COPD exacerbation (HCC) 12/10/2023   Diabetes mellitus type II, non insulin  dependent (HCC) 08/15/2023   Essential hypertension 08/15/2023   Dyslipidemia 08/15/2023   Tobacco dependence due to cigarettes 08/15/2023   Chronic diastolic CHF (congestive heart failure) (HCC) 08/14/2023   Hypothyroidism 08/14/2023   Bipolar mood disorder (HCC) 08/14/2023   Seizure-like activity (HCC) 08/03/2019   B12 deficiency 06/23/2019   Iron deficiency 06/21/2019   History of seizure 04/07/2019   Seizure disorder (HCC) 03/22/2019   Normocytic anemia 03/18/2019   Influenza A 11/10/2018   Hyponatremia 10/26/2018   Syncopal episodes 06/23/2017   Acute on chronic respiratory failure with  hypoxia (HCC) 03/15/2017   COPD with acute exacerbation (HCC) 03/15/2017   Acute on chronic respiratory failure (HCC) 03/15/2017    Orientation RESPIRATION BLADDER Height & Weight     Self  O2 (2L [place]) Incontinent Weight: 68 kg Height:  5' 5 (165.1 cm)  BEHAVIORAL SYMPTOMS/MOOD NEUROLOGICAL BOWEL NUTRITION STATUS      Incontinent Diet (Heart healthy carb modified)  AMBULATORY STATUS COMMUNICATION OF NEEDS Skin   Limited Assist Verbally Skin abrasions, Bruising                       Personal Care Assistance Level of Assistance              Functional Limitations Info  Hearing   Hearing Info: Impaired      SPECIAL CARE FACTORS FREQUENCY  PT (By licensed PT), OT (By licensed OT)                    Contractures Contractures Info: Not present    Additional Factors Info  Code Status, Allergies Code Status Info: Full Allergies Info: Penicillins, Prednisone            Current Medications (09/09/2024):  This is the current hospital active medication list Current Facility-Administered Medications  Medication Dose Route Frequency Provider Last Rate Last Admin   acetaminophen  (TYLENOL ) tablet 650 mg  650 mg Oral Q6H PRN Mansy, Jan A, MD       Or   acetaminophen  (TYLENOL ) suppository 650 mg  650 mg Rectal Q6H PRN Mansy, Madison LABOR, MD       alum & mag hydroxide-simeth (MAALOX/MYLANTA) 200-200-20 MG/5ML suspension 30 mL  30 mL Oral QID PRN Mansy, Jan A, MD       azithromycin  (ZITHROMAX ) 500 mg in sodium chloride  0.9 % 250 mL IVPB  500 mg Intravenous Q24H Niels Kayla FALCON, RPH 250 mL/hr at 09/09/24 0844 500 mg at 09/09/24 0844   busPIRone  (BUSPAR ) tablet 30 mg  30 mg Oral BID Cox, Amy N, DO   30 mg at 09/09/24 9161   carvedilol  (COREG ) tablet 3.125 mg  3.125 mg Oral BID WC Mansy, Jan A, MD   3.125 mg at 09/09/24 9160   cefTRIAXone  (ROCEPHIN ) 2 g in sodium chloride  0.9 % 100 mL IVPB  2 g Intravenous Q24H Mansy, Jan A, MD 200 mL/hr at 09/09/24 0532 2 g at 09/09/24 0532    chlorpheniramine-HYDROcodone  (TUSSIONEX) 10-8 MG/5ML suspension 5 mL  5 mL Oral Q12H PRN Mansy, Jan A, MD       cholecalciferol (VITAMIN D3) 25 MCG (1000 UNIT) tablet 5,000 Units  5,000 Units Oral Daily Mansy, Jan A, MD   5,000 Units at 09/09/24 9161   cyanocobalamin  (VITAMIN B12) tablet 1,000 mcg  1,000 mcg Oral Daily Mansy, Jan A, MD   1,000 mcg at 09/09/24 9160   enoxaparin  (LOVENOX ) injection 40 mg  40 mg Subcutaneous Q24H Mansy, Jan A, MD   40 mg at 09/09/24 9160   famotidine  (PEPCID ) tablet 40 mg  40 mg Oral Daily Mansy, Jan A, MD   40 mg at 09/09/24 9160   ferrous sulfate  tablet 325 mg  325 mg Oral Q breakfast Mansy, Jan A, MD   325 mg at 09/09/24 9160   Finerenone  TABS 20 mg  1 tablet Oral Daily Mansy, Jan A, MD       guaiFENesin  (MUCINEX ) 12 hr tablet 600 mg  600 mg Oral BID Mansy, Jan A, MD   600 mg at 09/09/24 9160   hydrALAZINE (APRESOLINE) injection 5 mg  5 mg Intravenous Q6H PRN Cox, Amy N, DO       insulin  aspart (novoLOG ) injection 0-15 Units  0-15 Units Subcutaneous TID WC Cox, Amy N, DO   2 Units at 09/08/24 1642   insulin  aspart (novoLOG ) injection 0-5 Units  0-5 Units Subcutaneous QHS Cox, Amy N, DO       ipratropium-albuterol  (DUONEB) 0.5-2.5 (3) MG/3ML nebulizer solution 3 mL  3 mL Nebulization BID Cox, Amy N, DO   3 mL at 09/09/24 9282   lamoTRIgine  (LAMICTAL ) tablet 150 mg  150 mg Oral BID Cox, Amy N, DO   150 mg at 09/09/24 9161   levETIRAcetam  (KEPPRA ) tablet 250 mg  250 mg Oral BID Cox, Amy N, DO   250 mg at 09/09/24 0840   levothyroxine  (SYNTHROID ) tablet 112 mcg  112 mcg Oral Q0600 Mansy, Jan A, MD   112 mcg at 09/09/24 0533   lithium  carbonate capsule 150 mg  150 mg Oral Daily Mansy, Jan A, MD   150 mg at 09/09/24 0840   loratadine  (CLARITIN ) tablet 10 mg  10 mg Oral Daily Mansy, Jan A, MD   10 mg at 09/09/24 9160   LORazepam  (ATIVAN ) injection 0.5 mg  0.5 mg Intravenous PRN Cox, Amy N, DO       magnesium  hydroxide (MILK OF MAGNESIA) suspension 30 mL  30 mL Oral Daily  PRN Mansy, Jan A, MD       multivitamin (RENA-VIT) tablet 1 tablet  1 tablet Oral Daily Mansy, Jan A, MD   1 tablet at 09/09/24 9160   nicotine  (NICODERM CQ  - dosed  in mg/24 hours) patch 21 mg  21 mg Transdermal Daily PRN Cox, Amy N, DO       omega-3 acid ethyl esters (LOVAZA ) capsule 1 g  1 g Oral BID Mansy, Jan A, MD   1 g at 09/09/24 9160   ondansetron  (ZOFRAN ) tablet 4 mg  4 mg Oral Q6H PRN Mansy, Jan A, MD       Or   ondansetron  (ZOFRAN ) injection 4 mg  4 mg Intravenous Q6H PRN Mansy, Madison LABOR, MD       Oral care mouth rinse  15 mL Mouth Rinse PRN Trudy Anthony HERO, MD       polyethylene glycol (MIRALAX  / GLYCOLAX ) packet 17 g  17 g Oral Daily Mansy, Jan A, MD   17 g at 09/09/24 0840   pravastatin  (PRAVACHOL ) tablet 40 mg  40 mg Oral Daily Mansy, Jan A, MD   40 mg at 09/09/24 9161   risperiDONE  (RISPERDAL ) tablet 0.5 mg  0.5 mg Oral BID Mansy, Jan A, MD   0.5 mg at 09/09/24 0840   traZODone  (DESYREL ) tablet 25 mg  25 mg Oral QHS PRN Mansy, Jan A, MD   25 mg at 09/07/24 2058   valbenazine  (INGREZZA ) capsule 80 mg  80 mg Oral Daily Mansy, Jan A, MD   80 mg at 09/08/24 9173     Discharge Medications: Please see discharge summary for a list of discharge medications.  Relevant Imaging Results:  Relevant Lab Results:   Additional Information ss [id number]  Corean ONEIDA Haddock, RN

## 2024-09-10 LAB — GLUCOSE, CAPILLARY
Glucose-Capillary: 358 mg/dL — ABNORMAL HIGH (ref 70–99)
Glucose-Capillary: 193 mg/dL — ABNORMAL HIGH (ref 70–99)

## 2024-09-10 NOTE — Progress Notes (Signed)
 Occupational Therapy Treatment Patient Details Name: Tammy [name] MRN: [id number] DOB: 1961/04/25 Today's Date: 09/10/2024   History of present illness Tammy [name] is a 63 year old female with history of hypertension, non-insulin -dependent diabetes mellitus type 2, anxiety, history of seizure, dementia, COPD, bipolar 1, who presents to the ED for chief concerns of shortness of breath and cough.   OT comments  [name] was seen for OT treatment on this date. Upon arrival to room pt seated in chair, agreeable to tx. Pt requires  SBA + RW for ADL t/f ~200 ft. MIN cues toileting. Cues t/o for sequencing tasks and redirection to task. Improved RW mgmt with correct hand placement. Pt making good progress toward goals, will continue to follow POC. Discharge recommendation remains appropriate.       If plan is discharge home, recommend the following:  Supervision due to cognitive status;Help with stairs or ramp for entrance;Assistance with cooking/housework;Direct supervision/assist for medications management   Equipment Recommendations  None recommended by OT    Recommendations for Other Services      Precautions / Restrictions Precautions Precautions: Fall Recall of Precautions/Restrictions: Impaired Restrictions Weight Bearing Restrictions Per Provider Order: No       Mobility Bed Mobility               General bed mobility comments: not tested    Transfers Overall transfer level: Needs assistance Equipment used: Rolling walker (2 wheels) Transfers: Sit to/from Stand Sit to Stand: Supervision                 Balance Overall balance assessment: Needs assistance Sitting-balance support: Feet supported Sitting balance-Leahy Scale: Normal     Standing balance support: No upper extremity supported, During functional activity Standing balance-Leahy Scale: Good                             ADL either performed or assessed with clinical  judgement   ADL Overall ADL's : Needs assistance/impaired                                       General ADL Comments: SBA + RW for ADL t/f ~200 ft. MIN cues toileting.     Communication Communication Communication: No apparent difficulties   Cognition Arousal: Alert Behavior During Therapy: WFL for tasks assessed/performed Cognition: History of cognitive impairments             OT - Cognition Comments: Oriented to self                 Following commands: Impaired Following commands impaired: Follows one step commands with increased time      Cueing   Cueing Techniques: Verbal cues, Gestural cues, Visual cues        General Comments SpO2 93% on 2L [place]    Pertinent Vitals/ Pain       Pain Assessment Pain Assessment: No/denies pain   Frequency  Min 2X/week        Progress Toward Goals  OT Goals(current goals can now be found in the care plan section)  Progress towards OT goals: Progressing toward goals  Acute Rehab OT Goals OT Goal Formulation: With patient Time For Goal Achievement: 09/22/24 Potential to Achieve Goals: Fair ADL Goals Pt Will Perform Grooming: with supervision;standing Pt Will Perform Lower Body Dressing: with supervision;sit to/from stand Pt Will Transfer  to Toilet: with supervision;ambulating Pt Will Perform Toileting - Clothing Manipulation and hygiene: with supervision;sit to/from stand  Plan      Co-evaluation                 AM-PAC OT 6 Clicks Daily Activity     Outcome Measure   Help from another person eating meals?: None Help from another person taking care of personal grooming?: A Little Help from another person toileting, which includes using toliet, bedpan, or urinal?: A Little Help from another person bathing (including washing, rinsing, drying)?: A Little Help from another person to put on and taking off regular upper body clothing?: A Little Help from another person to put on and  taking off regular lower body clothing?: A Little 6 Click Score: 19    End of Session    OT Visit Diagnosis: Unsteadiness on feet (R26.81);Repeated falls (R29.6);Muscle weakness (generalized) (M62.81)   Activity Tolerance Patient tolerated treatment well   Patient Left in chair;with call bell/phone within reach;with chair alarm set   Nurse Communication Mobility status        Time: 9045-8989 OT Time Calculation (min): 16 min  Charges: OT General Charges $OT Visit: 1 Visit OT Treatments $Self Care/Home Management : 8-22 mins  Elston Slot, M.S. OTR/L  09/10/24, 10:20 AM  ascom [phone]

## 2024-09-10 NOTE — Progress Notes (Signed)
 Physical Therapy Treatment Patient Details Name: Tammy [name] MRN: [id number] DOB: [birth year] Today's Date: 09/10/2024   History of Present Illness [name] is a 63 year old female with history of hypertension, non-insulin -dependent diabetes mellitus type 2, anxiety, history of seizure, dementia, COPD, bipolar 1, who presents to the ED for chief concerns of shortness of breath and cough.    PT Comments  Pt received upright in recliner agreeable to PT services. Pt remains stable on his 2 L/min. Pt completes all mobility at supervision level completing ~220'. Pt with improved L sided veering this date with ability to self correct. Also able to return to room and complete urination and pericare at supervision level. Pt returns to recliner with all needs. D/c recs remain appropriate.    If plan is discharge home, recommend the following: A little help with walking and/or transfers;A little help with bathing/dressing/bathroom;Direct supervision/assist for medications management;Supervision due to cognitive status;Assist for transportation;Assistance with feeding;Assistance with cooking/housework;Direct supervision/assist for financial management;Help with stairs or ramp for entrance   Can travel by private vehicle     Yes  Equipment Recommendations  None recommended by PT    Recommendations for Other Services       Precautions / Restrictions Precautions Precautions: Fall Recall of Precautions/Restrictions: Impaired Restrictions Weight Bearing Restrictions Per Provider Order: No     Mobility  Bed Mobility Overal bed mobility: Needs Assistance             General bed mobility comments: not tested Patient Response: Cooperative, Flat affect  Transfers Overall transfer level: Needs assistance Equipment used: Rolling walker (2 wheels) Transfers: Sit to/from Stand Sit to Stand: Supervision                Ambulation/Gait Ambulation/Gait assistance:  Supervision Gait Distance (Feet): 220 Feet Assistive device: Rolling walker (2 wheels) Gait Pattern/deviations: Trendelenburg, Decreased stride length       General Gait Details: improved L veering as pt is more aware of it. Self corrects.   Stairs             Wheelchair Mobility     Tilt Bed Tilt Bed Patient Response: Cooperative, Flat affect  Modified Rankin (Stroke Patients Only)       Balance Overall balance assessment: Needs assistance Sitting-balance support: Feet supported Sitting balance-Leahy Scale: Normal     Standing balance support: No upper extremity supported, During functional activity Standing balance-Leahy Scale: Good Standing balance comment: able to perform pericare in standing                            Communication Communication Communication: No apparent difficulties  Cognition Arousal: Alert Behavior During Therapy: WFL for tasks assessed/performed   PT - Cognitive impairments: History of cognitive impairments, Memory, Attention, Problem solving, Safety/Judgement                         Following commands: Impaired Following commands impaired: Follows one step commands with increased time    Cueing Cueing Techniques: Verbal cues, Gestural cues, Visual cues  Exercises      General Comments General comments (skin integrity, edema, etc.): SpO2 > 90% on 2 L/min      Pertinent Vitals/Pain Pain Assessment Pain Assessment: No/denies pain    Home Living                          Prior Function  PT Goals (current goals can now be found in the care plan section) Acute Rehab PT Goals PT Goal Formulation: Patient unable to participate in goal setting Progress towards PT goals: Progressing toward goals    Frequency    Min 2X/week      PT Plan      Co-evaluation              AM-PAC PT 6 Clicks Mobility   Outcome Measure  Help needed turning from your back to your side  while in a flat bed without using bedrails?: A Little Help needed moving from lying on your back to sitting on the side of a flat bed without using bedrails?: A Little Help needed moving to and from a bed to a chair (including a wheelchair)?: A Little Help needed standing up from a chair using your arms (e.g., wheelchair or bedside chair)?: A Little Help needed to walk in hospital room?: A Little Help needed climbing 3-5 steps with a railing? : A Little 6 Click Score: 18    End of Session Equipment Utilized During Treatment: Gait belt;Oxygen Activity Tolerance: Patient tolerated treatment well Patient left: in chair;with call bell/phone within reach;with chair alarm set Nurse Communication: Mobility status PT Visit Diagnosis: Other abnormalities of gait and mobility (R26.89);Muscle weakness (generalized) (M62.81)     Time: 8859-8841 PT Time Calculation (min) (ACUTE ONLY): 18 min  Charges:    $Gait Training: 8-22 mins PT General Charges $$ ACUTE PT VISIT: 1 Visit                     Dorina HERO. Fairly IV, PT, DPT Physical Therapist- [facility] 09/10/2024, 12:46 PM

## 2024-09-10 NOTE — TOC Transition Note (Signed)
 Transition of Care [facility]) - Discharge Note   Patient Details  Name: Tammy [name] MRN: [id number] Date of Birth: 1961/03/20  Transition of Care [facility] CM/SW Contact:  Corean ONEIDA Haddock, RN Phone Number: 09/10/2024, 11:34 AM   Clinical Narrative:     Therapy has upgraded recs to [facility] Unable to reach sister by phone Spoke with Marinda who confirms that patient can return today.  He will provide transport and bring portable O2. He is in agreement to [facility] services, and prefers [facility].  Channing with [facility] accepted in the HUB and notified  FL2 and DC summary to be printed and placed in DC packet per [facility] request  Bedside RN provided number to call report          Patient Goals and CMS Choice            Discharge Placement                       Discharge Plan and Services Additional resources added to the After Visit Summary for                                       Social Drivers of Health (SDOH) Interventions SDOH Screenings   Food Insecurity: No Food Insecurity (09/07/2024)  Housing: Low Risk  (09/07/2024)  Transportation Needs: No Transportation Needs (09/07/2024)  Utilities: Not At Risk (09/07/2024)  Financial Resource Strain: Low Risk  (10/29/2018)  Physical Activity: Unknown (10/29/2018)  Social Connections: Unknown (09/07/2024)  Stress: No Stress Concern Present (10/29/2018)  Tobacco Use: Medium Risk (09/07/2024)     Readmission Risk Interventions     No data to display

## 2024-09-10 NOTE — Plan of Care (Signed)

## 2024-09-10 NOTE — Plan of Care (Signed)
  Problem: Education: Goal: Knowledge of General Education information will improve Description: Including pain rating scale, medication(s)/side effects and non-pharmacologic comfort measures Outcome: Adequate for Discharge   Problem: Health Behavior/Discharge Planning: Goal: Ability to manage health-related needs will improve Outcome: Adequate for Discharge   Problem: Clinical Measurements: Goal: Ability to maintain clinical measurements within normal limits will improve Outcome: Adequate for Discharge Goal: Will remain free from infection Outcome: Adequate for Discharge Goal: Diagnostic test results will improve Outcome: Adequate for Discharge Goal: Respiratory complications will improve Outcome: Adequate for Discharge Goal: Cardiovascular complication will be avoided Outcome: Adequate for Discharge   Problem: Activity: Goal: Risk for activity intolerance will decrease Outcome: Adequate for Discharge   Problem: Nutrition: Goal: Adequate nutrition will be maintained Outcome: Adequate for Discharge   Problem: Coping: Goal: Level of anxiety will decrease Outcome: Adequate for Discharge   Problem: Elimination: Goal: Will not experience complications related to bowel motility Outcome: Adequate for Discharge Goal: Will not experience complications related to urinary retention Outcome: Adequate for Discharge   Problem: Pain Managment: Goal: General experience of comfort will improve and/or be controlled Outcome: Adequate for Discharge   Problem: Safety: Goal: Ability to remain free from injury will improve Outcome: Adequate for Discharge   Problem: Skin Integrity: Goal: Risk for impaired skin integrity will decrease Outcome: Adequate for Discharge   Problem: Activity: Goal: Ability to tolerate increased activity will improve Outcome: Adequate for Discharge   Problem: Clinical Measurements: Goal: Ability to maintain a body temperature in the normal range will  improve Outcome: Adequate for Discharge   Problem: Respiratory: Goal: Ability to maintain adequate ventilation will improve Outcome: Adequate for Discharge Goal: Ability to maintain a clear airway will improve Outcome: Adequate for Discharge   Problem: Education: Goal: Ability to describe self-care measures that may prevent or decrease complications (Diabetes Survival Skills Education) will improve Outcome: Adequate for Discharge Goal: Individualized Educational Video(s) Outcome: Adequate for Discharge   Problem: Coping: Goal: Ability to adjust to condition or change in health will improve Outcome: Adequate for Discharge   Problem: Fluid Volume: Goal: Ability to maintain a balanced intake and output will improve Outcome: Adequate for Discharge   Problem: Health Behavior/Discharge Planning: Goal: Ability to identify and utilize available resources and services will improve Outcome: Adequate for Discharge Goal: Ability to manage health-related needs will improve Outcome: Adequate for Discharge   Problem: Metabolic: Goal: Ability to maintain appropriate glucose levels will improve Outcome: Adequate for Discharge   Problem: Nutritional: Goal: Maintenance of adequate nutrition will improve Outcome: Adequate for Discharge Goal: Progress toward achieving an optimal weight will improve Outcome: Adequate for Discharge   Problem: Skin Integrity: Goal: Risk for impaired skin integrity will decrease Outcome: Adequate for Discharge   Problem: Tissue Perfusion: Goal: Adequacy of tissue perfusion will improve Outcome: Adequate for Discharge

## 2024-09-10 NOTE — Discharge Summary (Signed)
 Tammy [name] FMW:[id number] DOB: [birth year] DOA: 09/06/2024  PCP: Tammy Channing SQUIBB, FNP  Admit date: 09/06/2024 Discharge date: 09/10/2024  Time spent: 35 minutes  Recommendations for Outpatient Follow-up:  Pcp f/u 1 week     Discharge Diagnoses:  Principal Problem:   CAP (community acquired pneumonia) Active Problems:   Acute encephalopathy   Type 2 diabetes mellitus with hyperlipidemia (HCC)   GERD without esophagitis   Hypothyroidism   Seizure disorder (HCC)   Bipolar 1 disorder (HCC)   Anxiety and depression   Discharge Condition: improved  Diet recommendation: heart healthy  Filed Weights   09/06/24 2106  Weight: 68 kg    History of present illness:  From admission h and p Tammy [name] is a 63 y.o. female with medical history significant for anxiety, type diabetes mellitus, seizure disorder, dementia, COPD, CHF, and bipolar 1 disorder as well as asthma, who presented to the emergency room with acute onset of worsening cough and aspirated dyspnea and wheezing over the last 8 days.  She has been expectorating clear sputum.  No fever or chills.  No nausea or vomiting or abdominal pain.  No dysuria, oliguria or hematuria or flank pain.  She has been have episodes of likely tardive dyskinesia with rhythmic movement while opening her eyes with no response and briefly goes back to her normal state.  No bleeding diathesis.    Hospital Course:   Patient presents with respiratory symptoms. Cxr showed possible infiltrate. Patient was admitted for pneumonia and treated with 3 days ceftriaxone /azithromycin . No fever or leukocytosis, no additional w/u of her pneumonia was performed. Antibiotics discontinued, patient is breathing comfortably on her home 2 liters and never required more than that (in fact satting 100% on her home 2 liters), lungs clear. Will hold on further antibiotics, advise pcp f/u in one week. Patient also apparently encephalopathic on arrival, this on her  chronic bipolar disorder and dementia, ct head negative, now appears to be back to baseline. Other chronic problems stable. Evaluated by PT/OT who advise home health, ordered. Discharged to group home, discharge instructions reviewed with group home staff.   Procedures: none   Consultations: none  Discharge Exam: Vitals:   09/10/24 0837 09/10/24 0845  BP: 133/76 123/64  Pulse: 88 84  Resp: 16 18  Temp: 98.5 F (36.9 C) 98.7 F (37.1 C)  SpO2: 98% 99%    General: NAD Cardiovascular: RRR Respiratory: CTAB Ext: warm, no edema  Discharge Instructions   Discharge Instructions     Diet - low sodium heart healthy   Complete by: As directed    Increase activity slowly   Complete by: As directed       Allergies as of 09/10/2024       Reactions   Penicillins Rash   Has patient had a PCN reaction causing immediate rash, facial/tongue/throat swelling, SOB or lightheadedness with hypotension: No Has patient had a PCN reaction causing severe rash involving mucus membranes or skin necrosis: No Has patient had a PCN reaction that required hospitalization: No Has patient had a PCN reaction occurring within the last 10 years: No If all of the above answers are NO, then may proceed with Cephalosporin use.   Prednisone  Rash        Medication List     TAKE these medications    albuterol  108 (90 Base) MCG/ACT inhaler Commonly known as: VENTOLIN  HFA Inhale 1-2 puffs into the lungs every 6 (six) hours as needed for wheezing or shortness of breath.  albuterol  (2.5 MG/3ML) 0.083% nebulizer solution Commonly known as: PROVENTIL  Take 2.5 mg by nebulization in the morning and at bedtime.   aluminum -magnesium  hydroxide-simethicone  200-200-20 MG/5ML Susp Commonly known as: MAALOX Take 30 mLs by mouth 4 (four) times daily as needed. Home med.   b complex vitamins capsule Take 1 capsule by mouth daily. Every other day   busPIRone  30 MG tablet Commonly known as: BUSPAR  Take  30 mg by mouth 2 (two) times daily.   carvedilol  3.125 MG tablet Commonly known as: COREG  Take 3.125 mg by mouth 2 (two) times daily with a meal.   cetirizine 10 MG tablet Commonly known as: ZYRTEC Take 10 mg by mouth daily.   Cyanocobalamin  1000 MCG Subl Place 1 tablet (1,000 mcg total) under the tongue daily.   empagliflozin  25 MG Tabs tablet Commonly known as: JARDIANCE  Take 25 mg by mouth daily.   famotidine  40 MG tablet Commonly known as: PEPCID  Take 40 mg by mouth daily.   ferrous sulfate  325 (65 FE) MG EC tablet Take 1 tablet (325 mg total) by mouth daily with breakfast.   Fish Oil 1000 MG Caps Take 1,000 mg by mouth 2 (two) times daily.   fluticasone  50 MCG/ACT nasal spray Commonly known as: FLONASE  Place 1 spray into both nostrils daily.   Kerendia  20 MG Tabs Generic drug: Finerenone  Take 1 tablet by mouth daily.   lamoTRIgine  150 MG tablet Commonly known as: LAMICTAL  Take 150 mg by mouth 2 (two) times daily.   levETIRAcetam  250 MG tablet Commonly known as: KEPPRA  Take 250 mg by mouth 2 (two) times daily.   levothyroxine  112 MCG tablet Commonly known as: SYNTHROID  Take 112 mcg by mouth daily before breakfast.   lithium  carbonate 150 MG capsule Take 150 mg by mouth daily.   metFORMIN  500 MG tablet Commonly known as: GLUCOPHAGE  Take 500 mg by mouth 2 (two) times daily with a meal.   ondansetron  4 MG tablet Commonly known as: ZOFRAN  Take 1 tablet (4 mg total) by mouth every 8 (eight) hours as needed for nausea or vomiting.   polyethylene glycol 17 g packet Commonly known as: MIRALAX  / GLYCOLAX  Take 17 g by mouth daily.   pravastatin  40 MG tablet Commonly known as: PRAVACHOL  Take 40 mg by mouth daily.   risperiDONE  0.5 MG tablet Commonly known as: RISPERDAL  Take 0.5 mg by mouth 2 (two) times daily.   Trelegy Ellipta 100-62.5-25 MCG/INH Aepb Generic drug: Fluticasone -Umeclidin-Vilant Inhale 1 puff into the lungs daily.   valbenazine  80 MG  capsule Commonly known as: Ingrezza  Take 1 capsule (80 mg total) by mouth daily.   Vitamin D  1.25 MG (50000 UT) Caps Take 5,000 Units by mouth daily.   Vitamin K2 100 MCG Tabs Take 100 mcg by mouth daily.       Allergies  Allergen Reactions   Penicillins Rash    Has patient had a PCN reaction causing immediate rash, facial/tongue/throat swelling, SOB or lightheadedness with hypotension: No Has patient had a PCN reaction causing severe rash involving mucus membranes or skin necrosis: No Has patient had a PCN reaction that required hospitalization: No Has patient had a PCN reaction occurring within the last 10 years: No If all of the above answers are NO, then may proceed with Cephalosporin use.    Prednisone  Rash    Follow-up Information     Tammy Channing SQUIBB, FNP. Schedule an appointment as soon as possible for a visit .   Specialty: Family Medicine Contact information: [phone] [place] [address] [phone]                  The results of significant diagnostics from this hospitalization (including imaging, microbiology, ancillary and laboratory) are listed below for reference.    Significant Diagnostic Studies: EEG adult Result Date: 09/07/2024 Matthews Elida HERO, MD     09/08/2024  2:47 PM Routine EEG Report HENSLEE LOTTMAN is a 63 y.o. female with a history of seizure who is undergoing an EEG to evaluate for seizures. Report: This EEG was acquired with electrodes placed according to the International 10-20 electrode system (including Fp1, Fp2, F3, F4, C3, C4, P3, P4, O1, O2, T3, T4, T5, T6, A1, A2, Fz, Cz, Pz). The following electrodes were missing or displaced: none. The occipital dominant rhythm was 9 Hz. This activity is reactive to stimulation. Drowsiness was manifested by background fragmentation; deeper stages of sleep were identified by K complexes and sleep spindles. There was no focal slowing. There were no interictal epileptiform  discharges. There were no electrographic seizures identified. There was no abnormal response to photic stimulation or hyperventilation. Impression: This EEG was obtained while awake and asleep and is normal. Multiple instances of abnormal mouth movements consistent with tardive dyskinesia were captured and had no ictal correlate. These are NOT seizures.   Clinical Correlation: Normal EEGs, however, do not rule out epilepsy. Elida Matthews, MD [facility] [phone] If 7pm- 7am, please page neurology on call as listed in AMION.   CT Head Wo Contrast Result Date: 09/07/2024 EXAM: CT HEAD WITHOUT 09/07/2024 03:00:21 AM TECHNIQUE: CT of the head was performed without the administration of intravenous contrast. Automated exposure control, iterative reconstruction, and/or weight based adjustment of the mA/kV was utilized to reduce the radiation dose to as low as reasonably achievable. COMPARISON: 07/27/2024 CLINICAL HISTORY: AMS FINDINGS: BRAIN AND VENTRICLES: No acute intracranial hemorrhage. No mass effect or midline shift. No extra-axial fluid collection. No evidence of acute infarct. No hydrocephalus. ORBITS: No acute abnormality. SINUSES AND MASTOIDS: No acute abnormality. SOFT TISSUES AND SKULL: No acute skull fracture. No acute soft tissue abnormality. IMPRESSION: 1. No acute intracranial abnormality. Electronically signed by: Franky Stanford MD 09/07/2024 03:06 AM EST RP Workstation: [id number]   DG Chest 2 View Result Date: 09/06/2024 CLINICAL DATA:  Shortness of breath. EXAM: CHEST - 2 VIEW COMPARISON:  July 27, 2024 FINDINGS: The heart size and mediastinal contours are within normal limits. There is marked severity calcification of the thoracic aorta. Mild, diffuse, chronic appearing increased interstitial lung markings are noted. Mild, hazy superimposed atelectasis and/or infiltrate is seen within the mid right lung and right lung base. No pleural effusion or pneumothorax is identified. The  visualized skeletal structures are unremarkable. IMPRESSION: Chronic appearing increased interstitial lung markings with mild, hazy superimposed mid right lung and right basilar atelectasis and/or infiltrate. Electronically Signed   By: Suzen Dials M.D.   On: 09/06/2024 21:45    Microbiology: No results found for this or any previous visit (from the past 240 hours).   Labs: Basic Metabolic Panel: Recent Labs  Lab 09/06/24 2118 09/07/24 0608  NA 140 144  K 4.5 4.1  CL 109 112*  CO2 19* 23  GLUCOSE 177* 104*  BUN 19 17  CREATININE 1.06* 0.88  CALCIUM  9.2 9.4   Liver Function Tests: No results for input(s): AST, ALT, ALKPHOS, BILITOT, PROT, ALBUMIN in the last 168 hours. No results for input(s): LIPASE, AMYLASE in the last 168 hours. No results for input(s): AMMONIA  in the last 168 hours. CBC: Recent Labs  Lab 09/06/24 2118 09/07/24 0608  WBC 7.2 5.7  HGB 12.2 12.5  HCT 38.6 38.4  MCV 97.0 93.7  PLT 184 186   Cardiac Enzymes: No results for input(s): CKTOTAL, CKMB, CKMBINDEX, TROPONINI in the last 168 hours. BNP: BNP (last 3 results) Recent Labs    12/10/23 2051 07/21/24 0347  BNP 27.8 24.0    ProBNP (last 3 results) No results for input(s): PROBNP in the last 8760 hours.  CBG: Recent Labs  Lab 09/09/24 0816 09/09/24 1136 09/09/24 1628 09/09/24 2116 09/10/24 0843  GLUCAP 119* 240* 110* 126* 358*       Signed:  Devaughn KATHEE Ban MD.  [facility] 09/10/2024, 11:25 AM

## 2024-09-10 NOTE — NC FL2 (Signed)
 [place]  MEDICAID [id number] LEVEL OF CARE FORM     IDENTIFICATION  Patient Name: Tammy [name] [facility] Birthdate: 27-Jul-1961 Sex: female Admission Date (Current Location): 09/06/2024  [facility] and [place] Number:  Chiropodist and Address:  [facility], [address], [place], [place] [address]      Provider Number: [id number]  Attending Physician Name and Address:  Kandis Devaughn Sayres, MD  Relative Name and Phone Number:       Current Level of Care: Hospital Recommended Level of Care: [facility] (with PT and OT through [facility]) Prior Approval Number:    Date Approved/Denied:   PASRR Number: [id number]  Discharge Plan: Other (Comment) (Family Care Home with PT and OT through [facility])    Current Diagnoses: Patient Active Problem List   Diagnosis Date Noted   Acute encephalopathy 09/07/2024   Bipolar 1 disorder (HCC) 09/07/2024   Anxiety and depression 09/07/2024   Tardive dyskinesia 09/07/2024   Acute metabolic encephalopathy 07/27/2024   AMS (altered mental status) 07/27/2024   Pneumonia 07/22/2024   CAP (community acquired pneumonia) 07/21/2024   PNA (pneumonia) 07/21/2024   Anxiety 12/11/2023   GERD without esophagitis 12/11/2023   Type 2 diabetes mellitus with chronic kidney disease, with long-term current use of insulin  (HCC) 12/11/2023   COPD exacerbation (HCC) 12/10/2023   Type 2 diabetes mellitus with hyperlipidemia (HCC) 08/15/2023   Essential hypertension 08/15/2023   Dyslipidemia 08/15/2023   Tobacco dependence due to cigarettes 08/15/2023   Chronic diastolic CHF (congestive heart failure) (HCC) 08/14/2023   Hypothyroidism 08/14/2023   Bipolar mood disorder (HCC) 08/14/2023   Seizure-like activity (HCC) 08/03/2019   B12 deficiency 06/23/2019   Iron deficiency 06/21/2019   History of seizure 04/07/2019   Seizure disorder (HCC) 03/22/2019   Normocytic anemia 03/18/2019   Influenza A 11/10/2018    Hyponatremia 10/26/2018   Syncopal episodes 06/23/2017   Acute on chronic respiratory failure with hypoxia (HCC) 03/15/2017   COPD with acute exacerbation (HCC) 03/15/2017   Acute on chronic respiratory failure (HCC) 03/15/2017    Orientation RESPIRATION BLADDER Height & Weight     Self  O2 (Nasal Cannula 2 L) Incontinent Weight: 150 lb (68 kg) Height:  5' 5 (165.1 cm)  BEHAVIORAL SYMPTOMS/MOOD NEUROLOGICAL BOWEL NUTRITION STATUS     (Seizure disorder) Incontinent Diet (Heart healthy)  AMBULATORY STATUS COMMUNICATION OF NEEDS Skin   Limited Assist Verbally Bruising, Other (Comment) (Scratch marks.)                       Personal Care Assistance Level of Assistance  Bathing, Feeding, Dressing Bathing Assistance: Limited assistance Feeding assistance: Limited assistance Dressing Assistance: Limited assistance     Functional Limitations Info  Sight, Hearing, Speech Sight Info: Adequate Hearing Info: Adequate Speech Info: Adequate    SPECIAL CARE FACTORS FREQUENCY  PT (By licensed PT), OT (By licensed OT)     PT Frequency: 3 x week OT Frequency: 3 x week            Contractures Contractures Info: Not present    Additional Factors Info  Code Status, Allergies Code Status Info: Full code Allergies Info: Penicillins, Prednisone            Current Medications (09/10/2024):  This is the current hospital active medication list Current Facility-Administered Medications  Medication Dose Route Frequency Provider Last Rate Last Admin   acetaminophen  (TYLENOL ) tablet 650 mg  650 mg Oral Q6H PRN Mansy,  Madison LABOR, MD       Or   acetaminophen  (TYLENOL ) suppository 650 mg  650 mg Rectal Q6H PRN Mansy, Madison LABOR, MD       alum & mag hydroxide-simeth (MAALOX/MYLANTA) 200-200-20 MG/5ML suspension 30 mL  30 mL Oral QID PRN Mansy, Jan A, MD       budesonide  (PULMICORT ) nebulizer solution 0.25 mg  0.25 mg Nebulization BID Arrien, Mauricio Daniel, MD   0.25 mg at 09/10/24 0751    busPIRone  (BUSPAR ) tablet 30 mg  30 mg Oral BID Cox, Amy N, DO   30 mg at 09/10/24 9080   carvedilol  (COREG ) tablet 3.125 mg  3.125 mg Oral BID WC Mansy, Jan A, MD   3.125 mg at 09/10/24 0920   chlorpheniramine-HYDROcodone  (TUSSIONEX) 10-8 MG/5ML suspension 5 mL  5 mL Oral Q12H PRN Mansy, Jan A, MD       cholecalciferol  (VITAMIN D3) 25 MCG (1000 UNIT) tablet 5,000 Units  5,000 Units Oral Daily Mansy, Jan A, MD   5,000 Units at 09/10/24 0919   cyanocobalamin  (VITAMIN B12) tablet 1,000 mcg  1,000 mcg Oral Daily Mansy, Jan A, MD   1,000 mcg at 09/10/24 9079   enoxaparin  (LOVENOX ) injection 40 mg  40 mg Subcutaneous Q24H Mansy, Jan A, MD   40 mg at 09/10/24 9081   famotidine  (PEPCID ) tablet 40 mg  40 mg Oral Daily Mansy, Jan A, MD   40 mg at 09/10/24 9081   ferrous sulfate  tablet 325 mg  325 mg Oral Q breakfast Mansy, Jan A, MD   325 mg at 09/10/24 0920   Finerenone  TABS 20 mg  1 tablet Oral Daily Mansy, Jan A, MD       guaiFENesin  (MUCINEX ) 12 hr tablet 600 mg  600 mg Oral BID Mansy, Jan A, MD   600 mg at 09/10/24 9078   hydrALAZINE  (APRESOLINE ) injection 5 mg  5 mg Intravenous Q6H PRN Cox, Amy N, DO       insulin  aspart (novoLOG ) injection 0-15 Units  0-15 Units Subcutaneous TID WC Cox, Amy N, DO   3 Units at 09/10/24 1139   insulin  aspart (novoLOG ) injection 0-5 Units  0-5 Units Subcutaneous QHS Cox, Amy N, DO       ipratropium-albuterol  (DUONEB) 0.5-2.5 (3) MG/3ML nebulizer solution 3 mL  3 mL Nebulization BID Cox, Amy N, DO   3 mL at 09/10/24 0751   lamoTRIgine  (LAMICTAL ) tablet 150 mg  150 mg Oral BID Cox, Amy N, DO   150 mg at 09/10/24 0920   levETIRAcetam  (KEPPRA ) tablet 250 mg  250 mg Oral BID Cox, Amy N, DO   250 mg at 09/10/24 9077   levothyroxine  (SYNTHROID ) tablet 112 mcg  112 mcg Oral Q0600 Mansy, Jan A, MD   112 mcg at 09/10/24 0606   lithium  carbonate capsule 150 mg  150 mg Oral Daily Mansy, Jan A, MD   150 mg at 09/10/24 9077   loratadine  (CLARITIN ) tablet 10 mg  10 mg Oral Daily  Mansy, Jan A, MD   10 mg at 09/10/24 9080   LORazepam  (ATIVAN ) injection 0.5 mg  0.5 mg Intravenous PRN Cox, Amy N, DO       magnesium  hydroxide (MILK OF MAGNESIA) suspension 30 mL  30 mL Oral Daily PRN Mansy, Jan A, MD       multivitamin (RENA-VIT) tablet 1 tablet  1 tablet Oral Daily Mansy, Jan A, MD   1 tablet at 09/10/24 9078   nicotine  (NICODERM CQ  -  dosed in mg/24 hours) patch 21 mg  21 mg Transdermal Daily PRN Cox, Amy N, DO       omega-3 acid ethyl esters (LOVAZA ) capsule 1 g  1 g Oral BID Mansy, Jan A, MD   1 g at 09/10/24 9081   ondansetron  (ZOFRAN ) tablet 4 mg  4 mg Oral Q6H PRN Mansy, Jan A, MD       Or   ondansetron  (ZOFRAN ) injection 4 mg  4 mg Intravenous Q6H PRN Mansy, Madison LABOR, MD       Oral care mouth rinse  15 mL Mouth Rinse PRN Trudy Anthony HERO, MD       polyethylene glycol (MIRALAX  / GLYCOLAX ) packet 17 g  17 g Oral Daily Mansy, Jan A, MD   17 g at 09/10/24 9081   pravastatin  (PRAVACHOL ) tablet 40 mg  40 mg Oral Daily Mansy, Jan A, MD   40 mg at 09/10/24 9081   risperiDONE  (RISPERDAL ) tablet 0.5 mg  0.5 mg Oral BID Mansy, Jan A, MD   0.5 mg at 09/10/24 9077   traZODone  (DESYREL ) tablet 25 mg  25 mg Oral QHS PRN Mansy, Jan A, MD   25 mg at 09/09/24 2100   valbenazine  (INGREZZA ) capsule 80 mg  80 mg Oral Daily Mansy, Jan A, MD   80 mg at 09/10/24 9077     Discharge Medications: TAKE these medications     albuterol  108 (90 Base) MCG/ACT inhaler Commonly known as: VENTOLIN  HFA Inhale 1-2 puffs into the lungs every 6 (six) hours as needed for wheezing or shortness of breath.    albuterol  (2.5 MG/3ML) 0.083% nebulizer solution Commonly known as: PROVENTIL  Take 2.5 mg by nebulization in the morning and at bedtime.    aluminum -magnesium  hydroxide-simethicone  200-200-20 MG/5ML Susp Commonly known as: MAALOX Take 30 mLs by mouth 4 (four) times daily as needed. Home med.    b complex vitamins capsule Take 1 capsule by mouth daily. Every other day    busPIRone  30 MG  tablet Commonly known as: BUSPAR  Take 30 mg by mouth 2 (two) times daily.    carvedilol  3.125 MG tablet Commonly known as: COREG  Take 3.125 mg by mouth 2 (two) times daily with a meal.    cetirizine 10 MG tablet Commonly known as: ZYRTEC Take 10 mg by mouth daily.    Cyanocobalamin  1000 MCG Subl Place 1 tablet (1,000 mcg total) under the tongue daily.    empagliflozin  25 MG Tabs tablet Commonly known as: JARDIANCE  Take 25 mg by mouth daily.    famotidine  40 MG tablet Commonly known as: PEPCID  Take 40 mg by mouth daily.    ferrous sulfate  325 (65 FE) MG EC tablet Take 1 tablet (325 mg total) by mouth daily with breakfast.    Fish Oil 1000 MG Caps Take 1,000 mg by mouth 2 (two) times daily.    fluticasone  50 MCG/ACT nasal spray Commonly known as: FLONASE  Place 1 spray into both nostrils daily.    Kerendia  20 MG Tabs Generic drug: Finerenone  Take 1 tablet by mouth daily.    lamoTRIgine  150 MG tablet Commonly known as: LAMICTAL  Take 150 mg by mouth 2 (two) times daily.    levETIRAcetam  250 MG tablet Commonly known as: KEPPRA  Take 250 mg by mouth 2 (two) times daily.    levothyroxine  112 MCG tablet Commonly known as: SYNTHROID  Take 112 mcg by mouth daily before breakfast.    lithium  carbonate 150 MG capsule Take 150 mg by mouth daily.  metFORMIN  500 MG tablet Commonly known as: GLUCOPHAGE  Take 500 mg by mouth 2 (two) times daily with a meal.    ondansetron  4 MG tablet Commonly known as: ZOFRAN  Take 1 tablet (4 mg total) by mouth every 8 (eight) hours as needed for nausea or vomiting.    polyethylene glycol 17 g packet Commonly known as: MIRALAX  / GLYCOLAX  Take 17 g by mouth daily.    pravastatin  40 MG tablet Commonly known as: PRAVACHOL  Take 40 mg by mouth daily.    risperiDONE  0.5 MG tablet Commonly known as: RISPERDAL  Take 0.5 mg by mouth 2 (two) times daily.    Trelegy Ellipta 100-62.5-25 MCG/INH Aepb Generic drug:  Fluticasone -Umeclidin-Vilant Inhale 1 puff into the lungs daily.    valbenazine  80 MG capsule Commonly known as: Ingrezza  Take 1 capsule (80 mg total) by mouth daily.    Vitamin D  1.25 MG (50000 UT) Caps Take 5,000 Units by mouth daily.    Vitamin K2 100 MCG Tabs Take 100 mcg by mouth daily.    Relevant Imaging Results:  Relevant Lab Results:   Additional Information SS#: [id number]  Lauraine JAYSON Carpen, LCSW

## 2024-10-27 ENCOUNTER — Other Ambulatory Visit: Payer: Self-pay

## 2024-10-27 ENCOUNTER — Emergency Department
Admission: EM | Admit: 2024-10-27 | Discharge: 2024-10-27 | Disposition: A | Discharge status: Home / Self Care | Attending: Emergency Medicine | Admitting: Emergency Medicine

## 2024-10-27 ENCOUNTER — Encounter: Payer: Self-pay | Admitting: Emergency Medicine

## 2024-10-27 DIAGNOSIS — F0390 Unspecified dementia without behavioral disturbance: Secondary | ICD-10-CM | POA: Insufficient documentation

## 2024-10-27 DIAGNOSIS — N289 Disorder of kidney and ureter, unspecified: Secondary | ICD-10-CM | POA: Insufficient documentation

## 2024-10-27 DIAGNOSIS — R531 Weakness: Secondary | ICD-10-CM

## 2024-10-27 DIAGNOSIS — E875 Hyperkalemia: Secondary | ICD-10-CM | POA: Insufficient documentation

## 2024-10-27 DIAGNOSIS — J4489 Other specified chronic obstructive pulmonary disease: Secondary | ICD-10-CM | POA: Insufficient documentation

## 2024-10-27 DIAGNOSIS — I509 Heart failure, unspecified: Secondary | ICD-10-CM | POA: Insufficient documentation

## 2024-10-27 DIAGNOSIS — E860 Dehydration: Secondary | ICD-10-CM | POA: Insufficient documentation

## 2024-10-27 DIAGNOSIS — R197 Diarrhea, unspecified: Secondary | ICD-10-CM | POA: Insufficient documentation

## 2024-10-27 DIAGNOSIS — E119 Type 2 diabetes mellitus without complications: Secondary | ICD-10-CM | POA: Insufficient documentation

## 2024-10-27 LAB — CBC
HCT: 43.1 % (ref 36.0–46.0)
HCT: 39.3 % (ref 36.0–46.0)
Hemoglobin: 13.6 g/dL (ref 12.0–15.0)
Hemoglobin: 12.5 g/dL (ref 12.0–15.0)
MCH: 30.6 pg (ref 26.0–34.0)
MCH: 31.1 pg (ref 26.0–34.0)
MCHC: 31.6 g/dL (ref 30.0–36.0)
MCHC: 31.8 g/dL (ref 30.0–36.0)
MCV: 97.1 fL (ref 80.0–100.0)
MCV: 97.8 fL (ref 80.0–100.0)
Platelets: UNDETERMINED K/uL (ref 150–400)
Platelets: 153 K/uL (ref 150–400)
RBC: 4.44 MIL/uL (ref 3.87–5.11)
RBC: 4.02 MIL/uL (ref 3.87–5.11)
RDW: 12.6 % (ref 11.5–15.5)
RDW: 12.7 % (ref 11.5–15.5)
WBC: 4.8 K/uL (ref 4.0–10.5)
WBC: 5.6 K/uL (ref 4.0–10.5)
nRBC: 0 % (ref 0.0–0.2)
nRBC: 0 % (ref 0.0–0.2)

## 2024-10-27 LAB — URINALYSIS, ROUTINE W REFLEX MICROSCOPIC
Bacteria, UA: NONE SEEN
Bilirubin Urine: NEGATIVE
Glucose, UA: >=500 mg/dL — AB
Hgb urine dipstick: NEGATIVE
Ketones, ur: NEGATIVE mg/dL
Nitrite: NEGATIVE
Protein, ur: NEGATIVE mg/dL
Specific Gravity, Urine: 1.005 (ref 1.005–1.030)
pH: 6 (ref 5.0–8.0)

## 2024-10-27 LAB — COMPREHENSIVE METABOLIC PANEL WITH GFR
ALT: 27 U/L (ref 0–44)
AST: 50 U/L — ABNORMAL HIGH (ref 15–41)
Albumin: 4 g/dL (ref 3.5–5.0)
Alkaline Phosphatase: 90 U/L (ref 38–126)
Anion gap: 11 (ref 5–15)
BUN: 24 mg/dL — ABNORMAL HIGH (ref 8–23)
CO2: 19 mmol/L — ABNORMAL LOW (ref 22–32)
Calcium: 9.5 mg/dL (ref 8.9–10.3)
Chloride: 108 mmol/L (ref 98–111)
Creatinine, Ser: 1.04 mg/dL — ABNORMAL HIGH (ref 0.44–1.00)
GFR, Estimated: >60 mL/min
Glucose, Bld: 111 mg/dL — ABNORMAL HIGH (ref 70–99)
Potassium: 5.5 mmol/L — ABNORMAL HIGH (ref 3.5–5.1)
Sodium: 139 mmol/L (ref 135–145)
Total Bilirubin: <0.2 mg/dL (ref 0.0–1.2)
Total Protein: 6.3 g/dL — ABNORMAL LOW (ref 6.5–8.1)

## 2024-10-27 LAB — BASIC METABOLIC PANEL WITH GFR
Anion gap: 7 (ref 5–15)
BUN: 21 mg/dL (ref 8–23)
CO2: 25 mmol/L (ref 22–32)
Calcium: 8.9 mg/dL (ref 8.9–10.3)
Chloride: 111 mmol/L (ref 98–111)
Creatinine, Ser: 1.02 mg/dL — ABNORMAL HIGH (ref 0.44–1.00)
GFR, Estimated: >60 mL/min
Glucose, Bld: 135 mg/dL — ABNORMAL HIGH (ref 70–99)
Potassium: 4.5 mmol/L (ref 3.5–5.1)
Sodium: 142 mmol/L (ref 135–145)

## 2024-10-27 LAB — RESP PANEL BY RT-PCR (RSV, FLU A&B, COVID)  RVPGX2
Influenza A by PCR: NEGATIVE
Influenza B by PCR: NEGATIVE
Resp Syncytial Virus by PCR: NEGATIVE
SARS Coronavirus 2 by RT PCR: NEGATIVE

## 2024-10-27 LAB — LIPASE, BLOOD: Lipase: 53 U/L — ABNORMAL HIGH (ref 11–51)

## 2024-10-27 MED ORDER — SODIUM CHLORIDE 0.9 % IV BOLUS
1000.0000 mL | Freq: Once | INTRAVENOUS | Status: AC
  Administered 2024-10-27: 1000 mL via INTRAVENOUS

## 2024-10-27 NOTE — Discharge Instructions (Signed)
 Your workup in the emergency department has shown signs of dehydration.  After IV hydration your labs are normal.  Please follow-up with your doctor within the next 2 to 3 days for recheck/reevaluation.  Return to the emergency department for any symptom concerning to yourself or staff members.

## 2024-10-27 NOTE — ED Triage Notes (Signed)
 Pt arrives via EMS from [facility] with reports of diarrhea today (given imodium) and weakness. Staff reports pt not acting herself. Pt wears 2L Jay baseline.

## 2024-10-27 NOTE — ED Notes (Signed)
 Pt bed alarm was going off, Pt had crawled out of the bed standing in front of the bed. Pt was being non-verbal at the time. I had to guide her to the side of the bed to get back in, she wasn't wanting to go towards the bed at first but got the pt back into the bed. I covered the pt up and asked her if she needed anything, she said she was thirsty, so I got her some water . Gave the pt some water  and she spilled some so I tried to take the cup and the pt squeezed the cup refusing to let go of the cup, ending up soaking the bed with water  and ice. I got the pt up out of the bed so I could change the bed sheet. Got the pt into the bed and covered up. Call light within reach and bed alarm is on.

## 2024-10-27 NOTE — ED Provider Notes (Signed)
 "  [facility] Provider Note    Event Date/Time   First MD Initiated Contact with Patient 10/27/24 1655     (approximate)  History   Chief Complaint: Weakness and Diarrhea  HPI  Tammy [name] is a 64 y.o. female with a past medical history anxiety, asthma, bipolar, CHF, COPD, diabetes, dementia, presents to the emergency department from her group care facility for weakness and diarrhea.  According to EMS report group facility noted that the patient has been experiencing diarrhea since last night and has been acting more weak compared to her baseline.  Patient does have a history of dementia at baseline.  Patient here denies any symptoms, says she feels well.  Is asking for something to eat.  Vital signs are reassuring besides temperature of 99 which could possibly negate low-grade temperature.  Patient denies any nausea or vomiting does state diarrhea but unable to quantify how long has been going on.  Denies any urinary symptoms.  Physical Exam   Triage Vital Signs: ED Triage Vitals  Encounter Vitals Group     BP 10/27/24 1523 121/76     Girls Systolic BP Percentile --      Girls Diastolic BP Percentile --      Boys Systolic BP Percentile --      Boys Diastolic BP Percentile --      Pulse Rate 10/27/24 1523 60     Resp 10/27/24 1523 18     Temp 10/27/24 1523 99 F (37.2 C)     Temp Source 10/27/24 1523 Oral     SpO2 10/27/24 1521 98 %     Weight 10/27/24 1524 149 lb 14.6 oz (68 kg)     Height 10/27/24 1524 5' 5 (1.651 m)     Head Circumference --      Peak Flow --      Pain Score 10/27/24 1522 0     Pain Loc --      Pain Education --      Exclude from Growth Chart --     Most recent vital signs: Vitals:   10/27/24 1521 10/27/24 1523  BP:  121/76  Pulse:  60  Resp:  18  Temp:  99 F (37.2 C)  SpO2: 98% 100%    General: Awake, no distress.  CV:  Good peripheral perfusion.  Regular rate and rhythm  Resp:  Normal effort.  Equal breath  sounds bilaterally.  Abd:  No distention.  Soft, nontender.  No rebound or guarding.  ED Results / Procedures / Treatments   EKG  EKG viewed and interpreted by myself shows sinus bradycardia 59 bpm with a narrow QRS, normal axis, normal intervals, nonspecific ST changes.   MEDICATIONS ORDERED IN ED: Medications  sodium chloride  0.9 % bolus 1,000 mL (has no administration in time range)     IMPRESSION / MDM / ASSESSMENT AND PLAN / ED COURSE  I reviewed the triage vital signs and the nursing notes.  Patient's presentation is most consistent with acute presentation with potential threat to life or bodily function.  Patient presents emergency department for increased weakness and experiencing diarrhea today.  Patient's lab work today shows reassuring CBC with a normal white blood cell count.  Patient's chemistry does show signs of some dehydration mild renal insufficiency potassium elevated to 5.5 bicarb at 19.  Lipase is normal.  On my examination patient has a benign abdomen completely nontender.  Will obtain a COVID/flu swab as a precaution given the  borderline low-grade temperature we will obtain a urinalysis.  Will IV hydrate and continue to closely monitor.  Patient agreeable to plan of care.  Patient's repeat labs after IV hydration are much improved CBC is normal platelet count of 153.  Urinalysis is normal no sign of infection.  Repeat chemistry shows improvement of hyperkalemia now normal at 4.5.  Patient is feeling well.  We will discharge from the emergency department with outpatient follow-up.  FINAL CLINICAL IMPRESSION(S) / ED DIAGNOSES   Diarrhea Weakness   Note:  This document was prepared using Dragon voice recognition software and may include unintentional dictation errors.   Dorothyann Drivers, MD 10/27/24 1957  "

## 2024-11-11 ENCOUNTER — Emergency Department

## 2024-11-11 ENCOUNTER — Other Ambulatory Visit: Payer: Self-pay

## 2024-11-11 ENCOUNTER — Emergency Department: Admission: EM | Admit: 2024-11-11 | Discharge: 2024-11-11 | Disposition: A | Discharge status: Home / Self Care

## 2024-11-11 DIAGNOSIS — E875 Hyperkalemia: Secondary | ICD-10-CM | POA: Insufficient documentation

## 2024-11-11 DIAGNOSIS — E119 Type 2 diabetes mellitus without complications: Secondary | ICD-10-CM | POA: Insufficient documentation

## 2024-11-11 DIAGNOSIS — I509 Heart failure, unspecified: Secondary | ICD-10-CM | POA: Insufficient documentation

## 2024-11-11 DIAGNOSIS — R55 Syncope and collapse: Secondary | ICD-10-CM | POA: Insufficient documentation

## 2024-11-11 DIAGNOSIS — M545 Low back pain, unspecified: Secondary | ICD-10-CM | POA: Insufficient documentation

## 2024-11-11 DIAGNOSIS — W010XXA Fall on same level from slipping, tripping and stumbling without subsequent striking against object, initial encounter: Secondary | ICD-10-CM | POA: Insufficient documentation

## 2024-11-11 DIAGNOSIS — I110 Hypertensive heart disease with heart failure: Secondary | ICD-10-CM | POA: Insufficient documentation

## 2024-11-11 LAB — COMPREHENSIVE METABOLIC PANEL WITH GFR
ALT: 34 U/L (ref 0–44)
AST: 38 U/L (ref 15–41)
Albumin: 4.1 g/dL (ref 3.5–5.0)
Alkaline Phosphatase: 95 U/L (ref 38–126)
Anion gap: 11 (ref 5–15)
BUN: 22 mg/dL (ref 8–23)
CO2: 23 mmol/L (ref 22–32)
Calcium: 10.1 mg/dL (ref 8.9–10.3)
Chloride: 107 mmol/L (ref 98–111)
Creatinine, Ser: 1.1 mg/dL — ABNORMAL HIGH (ref 0.44–1.00)
GFR, Estimated: 56 mL/min — ABNORMAL LOW
Glucose, Bld: 94 mg/dL (ref 70–99)
Potassium: 5.2 mmol/L — ABNORMAL HIGH (ref 3.5–5.1)
Sodium: 141 mmol/L (ref 135–145)
Total Bilirubin: 0.2 mg/dL (ref 0.0–1.2)
Total Protein: 6.5 g/dL (ref 6.5–8.1)

## 2024-11-11 LAB — CBC
HCT: 45.1 % (ref 36.0–46.0)
Hemoglobin: 14 g/dL (ref 12.0–15.0)
MCH: 30.5 pg (ref 26.0–34.0)
MCHC: 31 g/dL (ref 30.0–36.0)
MCV: 98.3 fL (ref 80.0–100.0)
Platelets: 126 10*3/uL — ABNORMAL LOW (ref 150–400)
RBC: 4.59 MIL/uL (ref 3.87–5.11)
RDW: 13.1 % (ref 11.5–15.5)
WBC: 4.6 10*3/uL (ref 4.0–10.5)
nRBC: 0 % (ref 0.0–0.2)

## 2024-11-11 LAB — URINALYSIS, ROUTINE W REFLEX MICROSCOPIC
Bacteria, UA: NONE SEEN
Bilirubin Urine: NEGATIVE
Glucose, UA: >=500 mg/dL — AB
Hgb urine dipstick: NEGATIVE
Ketones, ur: NEGATIVE mg/dL
Leukocytes,Ua: NEGATIVE
Nitrite: NEGATIVE
Protein, ur: NEGATIVE mg/dL
Specific Gravity, Urine: 1.009 (ref 1.005–1.030)
Squamous Epithelial / HPF: 0 /HPF (ref 0–5)
pH: 5 (ref 5.0–8.0)

## 2024-11-11 MED ORDER — SODIUM CHLORIDE 0.9 % IV BOLUS
1000.0000 mL | Freq: Once | INTRAVENOUS | Status: AC
  Administered 2024-11-11: 1000 mL via INTRAVENOUS

## 2024-11-11 MED ORDER — ACETAMINOPHEN 325 MG PO TABS
650.0000 mg | ORAL_TABLET | Freq: Once | ORAL | Status: AC
  Administered 2024-11-11: 650 mg via ORAL
  Filled 2024-11-11: qty 2

## 2024-11-11 NOTE — ED Provider Notes (Signed)
 "  [facility] Provider Note    Event Date/Time   First MD Initiated Contact with Patient 11/11/24 1304     (approximate)   History   Loss of Consciousness   HPI  Tammy [name] is a 64 y.o. female with a past medical history of hypertension, CHF, hyponatremia, seizures, diabetes, presenting to the emergency department from community care home after a syncopal episode.  The patient initially reported that that she did not lose consciousness and did not lose her head and that she just lost her balance and fell backwards however she then was uncertain if she lost consciousness.  She reports pain to her lower back but states that this was present before the fall.     Physical Exam   Triage Vital Signs: ED Triage Vitals  Encounter Vitals Group     BP 11/11/24 1056 (!) 86/66     Girls Systolic BP Percentile --      Girls Diastolic BP Percentile --      Boys Systolic BP Percentile --      Boys Diastolic BP Percentile --      Pulse Rate 11/11/24 1056 (!) 56     Resp 11/11/24 1201 18     Temp 11/11/24 1056 98.2 F (36.8 C)     Temp Source 11/11/24 1056 Oral     SpO2 11/11/24 1056 96 %     Weight --      Height --      Head Circumference --      Peak Flow --      Pain Score 11/11/24 1033 0     Pain Loc --      Pain Education --      Exclude from Growth Chart --     Most recent vital signs: Vitals:   11/11/24 1253 11/11/24 1254  BP:  (!) 140/54  Pulse:  60  Resp:  18  Temp:  98.4 F (36.9 C)  SpO2: 99% 99%     General: Awake, no distress.  CV:  Good peripheral perfusion.  Resp:  Normal effort.  Abd:  No distention.  Other:     ED Results / Procedures / Treatments   Labs (all labs ordered are listed, but only abnormal results are displayed) Labs Reviewed  COMPREHENSIVE METABOLIC PANEL WITH GFR - Abnormal; Notable for the following components:      Result Value   Potassium 5.2 (*)    Creatinine, Ser 1.10 (*)    GFR, Estimated 56  (*)    All other components within normal limits  CBC - Abnormal; Notable for the following components:   Platelets 126 (*)    All other components within normal limits  URINALYSIS, ROUTINE W REFLEX MICROSCOPIC - Abnormal; Notable for the following components:   Color, Urine YELLOW (*)    APPearance HAZY (*)    Glucose, UA >=500 (*)    All other components within normal limits  CBG MONITORING, ED     EKG  ED ECG REPORT I, Reche CHRISTELLA Leventhal, the attending physician, personally viewed and interpreted this ECG.  Date: 11/11/2024 Rate: 57 bpm Rhythm: normal sinus rhythm QRS Axis: normal Intervals: normal ST/T Wave abnormalities: normal Narrative Interpretation: no evidence of acute ischemia    RADIOLOGY Head CT: 1. No acute intracranial abnormality.   Cervical CT: 1. No evidence of acute cervical spine fracture, traumatic  subluxation or static signs of instability.  2. 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  3.  Emphysema (ICD10-J43.9).   Lumbar XR: Minimal multilevel degenerative disc disease. No acute abnormality  seen.      PROCEDURES:  Critical Care performed: No  Procedures   MEDICATIONS ORDERED IN ED: Medications  acetaminophen  (TYLENOL ) tablet 650 mg (has no administration in time range)  sodium chloride  0.9 % bolus 1,000 mL (has no administration in time range)     IMPRESSION / MDM / ASSESSMENT AND PLAN / ED COURSE  I reviewed the triage vital signs and the nursing notes.                              Differential diagnosis includes, but is not limited to, vasovagal syncope, mechanical fall, cardiac arrhythmia, electrolyte abnormalities, seizure  Patient's presentation is most consistent with acute presentation with potential threat to life or bodily function.  Patient is a 64 year old female with a past medical history of hypertension, CHF, hyponatremia, seizures, diabetes, presenting to the emergency department from  community care home after a syncopal episode.  Given the uncertainty of the patient's story head CT and neck CT were ordered as well as lumbar spine x-ray, EKG, and lab work.  On lab work the patient's potassium is slightly elevated at 5.2.  It does appear to have been elevated in the past.  IV fluids ordered.  Patient's workup is otherwise unremarkable.  Discussed with the patient her results and discussed plan for discharge back to her facility.  Patient is agreeable with this plan of care.  She will follow-up with her primary care physician within the next few days.  Return to the emergency department for any new or worsening symptoms.      FINAL CLINICAL IMPRESSION(S) / ED DIAGNOSES   Final diagnoses:  Syncope, unspecified syncope type     Rx / DC Orders   ED Discharge Orders     None        Note:  This document was prepared using Dragon voice recognition software and may include unintentional dictation errors.   Rexford Reche HERO, MD 11/11/24 1605  "

## 2024-11-11 NOTE — ED Triage Notes (Addendum)
"   Pt comes with c/o syncopal from [facility]. Pt states no loc or hitting head. Pt states she just lost her balance and fell backwards. Pt does wear 2L [place] chronically.  "

## 2024-11-11 NOTE — Discharge Instructions (Signed)
 Follow-up with primary care physician within the next few days.  Continue home medications as prescribed.  Be sure to drink plenty of fluids.  Return to the emergency department for new or worsening symptoms.

## 2024-11-11 NOTE — ED Notes (Signed)
 2 RNS attempted to get a IV. IV team consulted

## 2024-12-06 ENCOUNTER — Ambulatory Visit: Admitting: Podiatry
